# Patient Record
Sex: Female | Born: 1960 | Race: Black or African American | Hispanic: No | Marital: Married | Smoking: Never smoker
Health system: Southern US, Community
[De-identification: ages and names within clinical notes are randomized; demographics above are authoritative.]

## PROBLEM LIST (undated history)

## (undated) DIAGNOSIS — J302 Other seasonal allergic rhinitis: Secondary | ICD-10-CM

## (undated) DIAGNOSIS — D649 Anemia, unspecified: Secondary | ICD-10-CM

## (undated) DIAGNOSIS — E119 Type 2 diabetes mellitus without complications: Secondary | ICD-10-CM

## (undated) DIAGNOSIS — M79671 Pain in right foot: Secondary | ICD-10-CM

## (undated) DIAGNOSIS — M5432 Sciatica, left side: Secondary | ICD-10-CM

## (undated) DIAGNOSIS — I10 Essential (primary) hypertension: Secondary | ICD-10-CM

## (undated) DIAGNOSIS — K589 Irritable bowel syndrome without diarrhea: Secondary | ICD-10-CM

## (undated) HISTORY — DX: Irritable bowel syndrome, unspecified: K58.9

## (undated) HISTORY — PX: KNEE SURGERY: SHX244

## (undated) HISTORY — DX: Anemia, unspecified: D64.9

## (undated) HISTORY — PX: TONSILLECTOMY: SUR1361

## (undated) HISTORY — DX: Type 2 diabetes mellitus without complications: E11.9

## (undated) HISTORY — DX: Sciatica, left side: M54.32

## (undated) HISTORY — DX: Pain in right foot: M79.671

## (undated) HISTORY — PX: APPENDECTOMY: SHX54

## (undated) HISTORY — PX: WRIST SURGERY: SHX841

## (undated) HISTORY — PX: OTHER SURGICAL HISTORY: SHX169

## (undated) HISTORY — PX: ABDOMINAL HYSTERECTOMY: SHX81

## (undated) HISTORY — PX: BREAST CYST EXCISION: SHX579

## (undated) HISTORY — PX: CHOLECYSTECTOMY: SHX55

## (undated) HISTORY — PX: EXCISIONAL HEMORRHOIDECTOMY: SHX1541

---

## 1987-10-16 HISTORY — PX: TOTAL ABDOMINAL HYSTERECTOMY W/ BILATERAL SALPINGOOPHORECTOMY: SHX83

## 1999-10-16 HISTORY — PX: BREAST SURGERY: SHX581

## 1999-12-26 ENCOUNTER — Other Ambulatory Visit: Admission: RE | Admit: 1999-12-26 | Discharge: 1999-12-26 | Payer: Self-pay | Admitting: Obstetrics & Gynecology

## 2001-01-29 ENCOUNTER — Encounter: Payer: Self-pay | Admitting: Family Medicine

## 2001-01-29 ENCOUNTER — Encounter: Admission: RE | Admit: 2001-01-29 | Discharge: 2001-01-29 | Payer: Self-pay | Admitting: Family Medicine

## 2003-05-25 ENCOUNTER — Other Ambulatory Visit: Admission: RE | Admit: 2003-05-25 | Discharge: 2003-05-25 | Payer: Self-pay | Admitting: Obstetrics and Gynecology

## 2004-05-16 ENCOUNTER — Other Ambulatory Visit: Admission: RE | Admit: 2004-05-16 | Discharge: 2004-05-16 | Payer: Self-pay | Admitting: Obstetrics and Gynecology

## 2004-08-29 ENCOUNTER — Emergency Department (HOSPITAL_COMMUNITY): Admission: EM | Admit: 2004-08-29 | Discharge: 2004-08-29 | Payer: Self-pay | Admitting: Emergency Medicine

## 2004-09-03 ENCOUNTER — Emergency Department (HOSPITAL_COMMUNITY): Admission: EM | Admit: 2004-09-03 | Discharge: 2004-09-03 | Payer: Self-pay | Admitting: Emergency Medicine

## 2005-10-25 ENCOUNTER — Other Ambulatory Visit: Admission: RE | Admit: 2005-10-25 | Discharge: 2005-10-25 | Payer: Self-pay | Admitting: Obstetrics and Gynecology

## 2005-11-14 ENCOUNTER — Ambulatory Visit (HOSPITAL_COMMUNITY): Admission: RE | Admit: 2005-11-14 | Discharge: 2005-11-14 | Payer: Self-pay | Admitting: Obstetrics and Gynecology

## 2007-12-24 ENCOUNTER — Encounter: Admission: RE | Admit: 2007-12-24 | Discharge: 2007-12-24 | Payer: Self-pay | Admitting: Orthopedic Surgery

## 2007-12-28 ENCOUNTER — Encounter: Admission: RE | Admit: 2007-12-28 | Discharge: 2007-12-28 | Payer: Self-pay | Admitting: Orthopedic Surgery

## 2008-09-20 ENCOUNTER — Encounter: Admission: RE | Admit: 2008-09-20 | Discharge: 2008-12-19 | Payer: Self-pay | Admitting: Orthopedic Surgery

## 2008-11-24 ENCOUNTER — Encounter: Admission: RE | Admit: 2008-11-24 | Discharge: 2009-01-03 | Payer: Self-pay | Admitting: Orthopedic Surgery

## 2009-09-09 ENCOUNTER — Ambulatory Visit: Payer: Self-pay | Admitting: Diagnostic Radiology

## 2009-09-09 ENCOUNTER — Emergency Department (HOSPITAL_BASED_OUTPATIENT_CLINIC_OR_DEPARTMENT_OTHER): Admission: EM | Admit: 2009-09-09 | Discharge: 2009-09-09 | Payer: Self-pay | Admitting: Emergency Medicine

## 2010-03-29 ENCOUNTER — Encounter: Admission: RE | Admit: 2010-03-29 | Discharge: 2010-04-12 | Payer: Self-pay | Admitting: Orthopedic Surgery

## 2010-04-14 ENCOUNTER — Encounter: Admission: RE | Admit: 2010-04-14 | Discharge: 2010-05-16 | Payer: Self-pay | Admitting: Orthopedic Surgery

## 2010-10-11 ENCOUNTER — Ambulatory Visit (HOSPITAL_COMMUNITY)
Admission: RE | Admit: 2010-10-11 | Discharge: 2010-10-12 | Payer: Self-pay | Source: Home / Self Care | Attending: Obstetrics and Gynecology | Admitting: Obstetrics and Gynecology

## 2010-10-15 HISTORY — PX: BLADDER SUSPENSION: SHX72

## 2010-12-25 LAB — CBC
HCT: 33.5 % — ABNORMAL LOW (ref 36.0–46.0)
Hemoglobin: 10.5 g/dL — ABNORMAL LOW (ref 12.0–15.0)
Hemoglobin: 8.8 g/dL — ABNORMAL LOW (ref 12.0–15.0)
MCH: 20.9 pg — ABNORMAL LOW (ref 26.0–34.0)
MCHC: 31.3 g/dL (ref 30.0–36.0)
MCHC: 31.7 g/dL (ref 30.0–36.0)
MCV: 65.8 fL — ABNORMAL LOW (ref 78.0–100.0)
MCV: 66 fL — ABNORMAL LOW (ref 78.0–100.0)
Platelets: 303 10*3/uL (ref 150–400)
RBC: 4.21 MIL/uL (ref 3.87–5.11)
RDW: 15.9 % — ABNORMAL HIGH (ref 11.5–15.5)

## 2011-01-17 LAB — CBC
MCHC: 31.8 g/dL (ref 30.0–36.0)
MCV: 68.3 fL — ABNORMAL LOW (ref 78.0–100.0)
Platelets: 325 10*3/uL (ref 150–400)
RDW: 14.5 % (ref 11.5–15.5)

## 2011-01-17 LAB — DIFFERENTIAL
Basophils Absolute: 0.1 10*3/uL (ref 0.0–0.1)
Eosinophils Absolute: 0.1 10*3/uL (ref 0.0–0.7)
Lymphocytes Relative: 36 % (ref 12–46)
Monocytes Absolute: 0.5 10*3/uL (ref 0.1–1.0)
Neutrophils Relative %: 53 % (ref 43–77)

## 2011-01-17 LAB — BASIC METABOLIC PANEL
BUN: 20 mg/dL (ref 6–23)
CO2: 25 mEq/L (ref 19–32)
Calcium: 9.7 mg/dL (ref 8.4–10.5)
Chloride: 107 mEq/L (ref 96–112)
Creatinine, Ser: 1 mg/dL (ref 0.4–1.2)

## 2011-01-18 ENCOUNTER — Other Ambulatory Visit: Payer: Self-pay | Admitting: Family Medicine

## 2011-01-19 ENCOUNTER — Other Ambulatory Visit: Payer: Self-pay

## 2011-01-22 ENCOUNTER — Ambulatory Visit
Admission: RE | Admit: 2011-01-22 | Discharge: 2011-01-22 | Disposition: A | Discharge status: Home / Self Care | Payer: BC Managed Care – PPO | Source: Ambulatory Visit | Attending: Family Medicine | Admitting: Family Medicine

## 2011-01-22 MED ORDER — IOHEXOL 300 MG/ML  SOLN
125.0000 mL | Freq: Once | INTRAMUSCULAR | Status: AC | PRN
  Administered 2011-01-22: 125 mL via INTRAVENOUS

## 2011-01-31 ENCOUNTER — Other Ambulatory Visit: Payer: Self-pay | Admitting: Family Medicine

## 2011-02-01 ENCOUNTER — Other Ambulatory Visit: Payer: BC Managed Care – PPO

## 2011-02-06 ENCOUNTER — Ambulatory Visit
Admission: RE | Admit: 2011-02-06 | Discharge: 2011-02-06 | Disposition: A | Discharge status: Home / Self Care | Payer: BC Managed Care – PPO | Source: Ambulatory Visit | Attending: Family Medicine | Admitting: Family Medicine

## 2011-02-06 ENCOUNTER — Other Ambulatory Visit: Payer: Self-pay | Admitting: Family Medicine

## 2011-03-31 ENCOUNTER — Emergency Department (INDEPENDENT_AMBULATORY_CARE_PROVIDER_SITE_OTHER): Payer: BC Managed Care – PPO

## 2011-03-31 ENCOUNTER — Emergency Department (HOSPITAL_BASED_OUTPATIENT_CLINIC_OR_DEPARTMENT_OTHER)
Admission: EM | Admit: 2011-03-31 | Discharge: 2011-04-01 | Disposition: A | Discharge status: Home / Self Care | Payer: BC Managed Care – PPO | Attending: Emergency Medicine | Admitting: Emergency Medicine

## 2011-03-31 DIAGNOSIS — J45909 Unspecified asthma, uncomplicated: Secondary | ICD-10-CM | POA: Insufficient documentation

## 2011-03-31 DIAGNOSIS — R079 Chest pain, unspecified: Secondary | ICD-10-CM | POA: Insufficient documentation

## 2011-03-31 DIAGNOSIS — I10 Essential (primary) hypertension: Secondary | ICD-10-CM | POA: Insufficient documentation

## 2011-03-31 DIAGNOSIS — R0602 Shortness of breath: Secondary | ICD-10-CM | POA: Insufficient documentation

## 2011-03-31 HISTORY — DX: Essential (primary) hypertension: I10

## 2011-03-31 LAB — CBC
MCH: 20.2 pg — ABNORMAL LOW (ref 26.0–34.0)
MCHC: 31.9 g/dL (ref 30.0–36.0)
MCV: 63.2 fL — ABNORMAL LOW (ref 78.0–100.0)
Platelets: 332 10*3/uL (ref 150–400)
RBC: 4.95 MIL/uL (ref 3.87–5.11)

## 2011-03-31 LAB — BASIC METABOLIC PANEL
CO2: 26 mEq/L (ref 19–32)
Chloride: 103 mEq/L (ref 96–112)
Creatinine, Ser: 0.8 mg/dL (ref 0.50–1.10)

## 2011-03-31 LAB — DIFFERENTIAL
Basophils Absolute: 0 10*3/uL (ref 0.0–0.1)
Eosinophils Absolute: 0.1 10*3/uL (ref 0.0–0.7)
Lymphs Abs: 3 10*3/uL (ref 0.7–4.0)
Monocytes Absolute: 0.6 10*3/uL (ref 0.1–1.0)
Monocytes Relative: 9 % (ref 3–12)
Neutro Abs: 3.4 10*3/uL (ref 1.7–7.7)

## 2011-03-31 LAB — TROPONIN I: Troponin I: <0.3 ng/mL (ref <0.30)

## 2011-04-01 ENCOUNTER — Encounter (HOSPITAL_BASED_OUTPATIENT_CLINIC_OR_DEPARTMENT_OTHER): Payer: Self-pay | Admitting: Radiology

## 2011-04-01 DIAGNOSIS — R0602 Shortness of breath: Secondary | ICD-10-CM

## 2011-04-01 DIAGNOSIS — R079 Chest pain, unspecified: Secondary | ICD-10-CM

## 2011-04-01 MED ORDER — IOHEXOL 350 MG/ML SOLN
80.0000 mL | Freq: Once | INTRAVENOUS | Status: AC | PRN
  Administered 2011-04-01: 80 mL via INTRAVENOUS

## 2012-01-17 ENCOUNTER — Emergency Department (INDEPENDENT_AMBULATORY_CARE_PROVIDER_SITE_OTHER): Payer: BC Managed Care – PPO

## 2012-01-17 ENCOUNTER — Emergency Department (HOSPITAL_BASED_OUTPATIENT_CLINIC_OR_DEPARTMENT_OTHER)
Admission: EM | Admit: 2012-01-17 | Discharge: 2012-01-17 | Disposition: A | Discharge status: Home / Self Care | Payer: BC Managed Care – PPO | Attending: Emergency Medicine | Admitting: Emergency Medicine

## 2012-01-17 ENCOUNTER — Encounter (HOSPITAL_BASED_OUTPATIENT_CLINIC_OR_DEPARTMENT_OTHER): Payer: Self-pay | Admitting: *Deleted

## 2012-01-17 DIAGNOSIS — R05 Cough: Secondary | ICD-10-CM | POA: Insufficient documentation

## 2012-01-17 DIAGNOSIS — R059 Cough, unspecified: Secondary | ICD-10-CM | POA: Insufficient documentation

## 2012-01-17 DIAGNOSIS — J45909 Unspecified asthma, uncomplicated: Secondary | ICD-10-CM | POA: Insufficient documentation

## 2012-01-17 DIAGNOSIS — J3489 Other specified disorders of nose and nasal sinuses: Secondary | ICD-10-CM | POA: Insufficient documentation

## 2012-01-17 DIAGNOSIS — I10 Essential (primary) hypertension: Secondary | ICD-10-CM | POA: Insufficient documentation

## 2012-01-17 HISTORY — DX: Other seasonal allergic rhinitis: J30.2

## 2012-01-17 MED ORDER — PREDNISONE 20 MG PO TABS: 40.0000 mg | ORAL_TABLET | Freq: Every day | ORAL | Status: DC

## 2012-01-17 MED ORDER — IPRATROPIUM BROMIDE 0.02 % IN SOLN
0.5000 mg | Freq: Once | RESPIRATORY_TRACT | Status: AC
  Administered 2012-01-17: 0.5 mg via RESPIRATORY_TRACT
  Filled 2012-01-17: qty 2.5

## 2012-01-17 MED ORDER — ALBUTEROL SULFATE (5 MG/ML) 0.5% IN NEBU
5.0000 mg | INHALATION_SOLUTION | Freq: Once | RESPIRATORY_TRACT | Status: AC
  Administered 2012-01-17: 5 mg via RESPIRATORY_TRACT
  Filled 2012-01-17: qty 1

## 2012-01-17 NOTE — Discharge Instructions (Signed)

## 2012-01-17 NOTE — ED Provider Notes (Signed)
Medical screening examination/treatment/procedure(s) were performed by non-physician practitioner and as supervising physician I was immediately available for consultation/collaboration.   Nat Christen, MD 01/17/12 [phone]

## 2012-01-17 NOTE — ED Provider Notes (Signed)
History     CSN: [id number]  Arrival date & time 01/17/12  2026   First MD Initiated Contact with Patient 01/17/12 2037      Chief Complaint  Patient presents with  . URI    (Consider location/radiation/quality/duration/timing/severity/associated sxs/prior treatment) Patient is a 51 y.o. female presenting with URI. The history is provided by the patient. No language interpreter was used.  URI The primary symptoms include cough and wheezing. Primary symptoms do not include fever. The current episode started 3 to 5 days ago. This is a new problem.  Symptoms associated with the illness include rhinorrhea.    Past Medical History  Diagnosis Date  . Hypertension   . Asthma   . Seasonal allergies     Past Surgical History  Procedure Date  . Abdominal hysterectomy   . Knee surgery   . Appendectomy   . Cholecystectomy   . Tonsillectomy   . Wrist surgery   . Breast surgery     No family history on file.  History  Substance Use Topics  . Smoking status: Never Smoker   . Smokeless tobacco: Not on file  . Alcohol Use: No    OB History    Grav Para Term Preterm Abortions TAB SAB Ect Mult Living                  Review of Systems  Constitutional: Negative for fever.  HENT: Positive for rhinorrhea.   Respiratory: Positive for cough and wheezing.   Cardiovascular: Negative.   Gastrointestinal: Negative.     Allergies  Aspirin  Home Medications   Current Outpatient Rx  Name Route Sig Dispense Refill  . BENZONATATE 100 MG PO CAPS Oral Take 100 mg by mouth 3 (three) times daily as needed.    Marland Kitchen ZYRTEC PO Oral Take 1 tablet by mouth daily as needed. Patient uses this medication for her allergies.    Marland Kitchen VITAMIN D 1000 UNITS PO TABS Oral Take 1,000 Units by mouth daily.    Marland Kitchen FERROUS SULFATE 325 (65 FE) MG PO TABS Oral Take 325 mg by mouth daily with breakfast.    . IBUPROFEN 200 MG PO TABS Oral Take 400 mg by mouth every 6 (six) hours as needed. Patient used this  medication for her knee pain.    Marland Kitchen BENICAR PO Oral Take 1 tablet by mouth daily.    . TRIAMTERENE-HCTZ 75-50 MG PO TABS Oral Take 1 tablet by mouth daily.      BP 138/72  Pulse 82  Temp(Src) 98.6 F (37 C) (Oral)  Resp 18  Ht 5' (1.524 m)  Wt 219 lb (99.338 kg)  BMI 42.77 kg/m2  SpO2 99%  Physical Exam  Nursing note and vitals reviewed. Constitutional: She is oriented to person, place, and time. She appears well-developed and well-nourished.  HENT:  Head: Normocephalic and atraumatic.  Right Ear: External ear normal.  Left Ear: External ear normal.  Nose: Rhinorrhea present.  Cardiovascular: Normal rate and regular rhythm.   Pulmonary/Chest: Effort normal. She has wheezes.  Musculoskeletal: Normal range of motion.  Neurological: She is alert and oriented to person, place, and time.  Skin: Skin is warm and dry.    ED Course  Procedures (including critical care time)  Labs Reviewed - No data to display Dg Chest 2 View  01/17/2012  *RADIOLOGY REPORT*  Clinical Data: Cough.  CHEST - 2 VIEW  Comparison: Chest CT 04/01/2011.  Findings: The cardiac silhouette, mediastinal and hilar contours are within  normal limits and stable.  The lungs are clear.  No pleural effusion.  The bony thorax is intact.  IMPRESSION: No acute cardiopulmonary findings.  Original Report Authenticated By: P. Loralie Champagne, M.D.     1. Asthmatic bronchitis       MDM  Pt felling better after treatment:will send home on steroids for a couple of days        Teressa Lower, NP 01/17/12 2147

## 2012-01-17 NOTE — ED Notes (Signed)
Pt reports cough and chest congestion since Sunday. [facility] and was [facility] cough/cold meds. Then saw PCP Tuesday and was given a "breathing tx". Pt reports continued chest tightness, congestion and SOB.

## 2012-02-21 ENCOUNTER — Emergency Department (HOSPITAL_BASED_OUTPATIENT_CLINIC_OR_DEPARTMENT_OTHER)
Admission: EM | Admit: 2012-02-21 | Discharge: 2012-02-21 | Disposition: A | Discharge status: Home / Self Care | Payer: BC Managed Care – PPO | Attending: Emergency Medicine | Admitting: Emergency Medicine

## 2012-02-21 ENCOUNTER — Encounter (HOSPITAL_BASED_OUTPATIENT_CLINIC_OR_DEPARTMENT_OTHER): Payer: Self-pay | Admitting: *Deleted

## 2012-02-21 DIAGNOSIS — I10 Essential (primary) hypertension: Secondary | ICD-10-CM | POA: Insufficient documentation

## 2012-02-21 DIAGNOSIS — R42 Dizziness and giddiness: Secondary | ICD-10-CM

## 2012-02-21 DIAGNOSIS — R5381 Other malaise: Secondary | ICD-10-CM | POA: Insufficient documentation

## 2012-02-21 LAB — CBC
HCT: 33.1 % — ABNORMAL LOW (ref 36.0–46.0)
Hemoglobin: 10.7 g/dL — ABNORMAL LOW (ref 12.0–15.0)
MCV: 66.2 fL — ABNORMAL LOW (ref 78.0–100.0)
RBC: 5 MIL/uL (ref 3.87–5.11)
WBC: 6.9 10*3/uL (ref 4.0–10.5)

## 2012-02-21 LAB — COMPREHENSIVE METABOLIC PANEL
AST: 14 U/L (ref 0–37)
BUN: 16 mg/dL (ref 6–23)
CO2: 27 mEq/L (ref 19–32)
Chloride: 104 mEq/L (ref 96–112)
Creatinine, Ser: 0.9 mg/dL (ref 0.50–1.10)
GFR calc non Af Amer: 73 mL/min — ABNORMAL LOW (ref >90)
Total Bilirubin: 0.1 mg/dL — ABNORMAL LOW (ref 0.3–1.2)

## 2012-02-21 LAB — OCCULT BLOOD X 1 CARD TO LAB, STOOL: Fecal Occult Bld: NEGATIVE

## 2012-02-21 LAB — DIFFERENTIAL
Basophils Relative: 0 % (ref 0–1)
Eosinophils Relative: 2 % (ref 0–5)
Lymphs Abs: 2.1 10*3/uL (ref 0.7–4.0)
Monocytes Relative: 8 % (ref 3–12)
Neutro Abs: 4.1 10*3/uL (ref 1.7–7.7)

## 2012-02-21 MED ORDER — SODIUM CHLORIDE 0.9 % IV SOLN: Freq: Once | INTRAVENOUS | Status: DC

## 2012-02-21 MED ORDER — MECLIZINE HCL 12.5 MG PO TABS: 25.0000 mg | ORAL_TABLET | Freq: Three times a day (TID) | ORAL | Status: AC | PRN

## 2012-02-21 MED ORDER — SODIUM CHLORIDE 0.9 % IV BOLUS (SEPSIS)
1000.0000 mL | Freq: Once | INTRAVENOUS | Status: AC
  Administered 2012-02-21: 1000 mL via INTRAVENOUS

## 2012-02-21 MED ORDER — MECLIZINE HCL 25 MG PO TABS
25.0000 mg | ORAL_TABLET | Freq: Once | ORAL | Status: AC
  Administered 2012-02-21: 25 mg via ORAL
  Filled 2012-02-21: qty 1

## 2012-02-21 NOTE — Discharge Instructions (Signed)

## 2012-02-21 NOTE — ED Provider Notes (Signed)
Medical screening examination/treatment/procedure(s) were conducted as a shared visit with non-physician practitioner(s) and myself.  I personally evaluated the patient during the encounter  Toy Baker, MD 02/21/12 2210

## 2012-02-21 NOTE — ED Notes (Signed)
States she has been having issues with rectal bleeding for the past month. Her MD is aware and she is suppose to have a colonoscopy next week. Last HGB was 10 something. She has been weak and dizzy today.

## 2012-02-21 NOTE — ED Provider Notes (Signed)
History     CSN: [id number]  Arrival date & time 02/21/12  2024   First MD Initiated Contact with Patient 02/21/12 2036      Chief Complaint  Patient presents with  . Weakness    (Consider location/radiation/quality/duration/timing/severity/associated sxs/prior treatment) HPI Comments: Pt states that she started having dizziness and weakness today:pt states that it is worse with change of position:pt states that she has had rectal bleeding over the last month and her hgb dropped one point in the last month:pt is scheduled for a colonoscopy in the next month  Patient is a 51 y.o. female presenting with weakness. The history is provided by the patient. No language interpreter was used.  Weakness The primary symptoms include dizziness. Primary symptoms do not include nausea or vomiting.  Dizziness also occurs with weakness. Dizziness does not occur with nausea or vomiting.  Additional symptoms include weakness.    Past Medical History  Diagnosis Date  . Hypertension   . Asthma   . Seasonal allergies     Past Surgical History  Procedure Date  . Abdominal hysterectomy   . Knee surgery   . Appendectomy   . Cholecystectomy   . Tonsillectomy   . Wrist surgery   . Breast surgery     No family history on file.  History  Substance Use Topics  . Smoking status: Never Smoker   . Smokeless tobacco: Not on file  . Alcohol Use: No    OB History    Grav Para Term Preterm Abortions TAB SAB Ect Mult Living                  Review of Systems  HENT: Negative.   Respiratory: Negative.  Negative for chest tightness and shortness of breath.   Cardiovascular: Negative.   Gastrointestinal: Negative for nausea and vomiting.  Neurological: Positive for dizziness and weakness.    Allergies  Aspirin  Home Medications   Current Outpatient Rx  Name Route Sig Dispense Refill  . ALBUTEROL SULFATE HFA 108 (90 BASE) MCG/ACT IN AERS Inhalation Inhale 2 puffs into the lungs every 6  (six) hours as needed.    Maximino Greenland 18-103 MCG/ACT IN AERO Inhalation Inhale 2 puffs into the lungs every 6 (six) hours as needed.    Marland Kitchen BENZONATATE 100 MG PO CAPS Oral Take 100 mg by mouth 3 (three) times daily as needed.    Marland Kitchen ZYRTEC PO Oral Take 1 tablet by mouth daily as needed. Patient uses this medication for her allergies.    Marland Kitchen VITAMIN D 1000 UNITS PO TABS Oral Take 1,000 Units by mouth daily.    Marland Kitchen FERROUS SULFATE 325 (65 FE) MG PO TABS Oral Take 325 mg by mouth daily with breakfast.    . IBUPROFEN 200 MG PO TABS Oral Take 400 mg by mouth every 6 (six) hours as needed. Patient used this medication for her knee pain.    Marland Kitchen BENICAR PO Oral Take 1 tablet by mouth daily.    Marland Kitchen PREDNISONE 20 MG PO TABS Oral Take 2 tablets (40 mg total) by mouth daily. 10 tablet 0  . TRIAMTERENE-HCTZ 75-50 MG PO TABS Oral Take 1 tablet by mouth daily.      BP 140/88  Pulse 84  Temp(Src) 98.4 F (36.9 C) (Oral)  Resp 20  Wt 218 lb (98.884 kg)  SpO2 99%  Physical Exam  Nursing note and vitals reviewed. Constitutional: She is oriented to person, place, and time. She appears well-developed and well-nourished.  HENT:  Head: Atraumatic.  Eyes: Conjunctivae and EOM are normal.  Cardiovascular: Normal rate and regular rhythm.   Pulmonary/Chest: Effort normal and breath sounds normal.  Abdominal: Soft. Bowel sounds are normal. There is no tenderness.  Genitourinary: Rectal exam shows no tenderness. Guaiac negative stool.  Musculoskeletal: Normal range of motion.  Neurological: She is alert and oriented to person, place, and time.  Skin: Skin is warm and dry.    ED Course  Procedures (including critical care time)  Labs Reviewed  CBC - Abnormal; Notable for the following:    Hemoglobin 10.7 (*)    HCT 33.1 (*)    MCV 66.2 (*)    MCH 21.4 (*)    RDW 15.6 (*)    All other components within normal limits  COMPREHENSIVE METABOLIC PANEL - Abnormal; Notable for the following:    Glucose, Bld  146 (*)    Total Bilirubin 0.1 (*)    GFR calc non Af Amer 73 (*)    GFR calc Af Amer 85 (*)    All other components within normal limits  DIFFERENTIAL  OCCULT BLOOD X 1 CARD TO LAB, STOOL   No results found.   1. Vertigo       MDM  Pt is feeling better at this time:pt is able to ambulate without any problem:will send home on meclizine:pt to follow up as needed        Teressa Lower, NP 02/21/12 2154

## 2012-02-21 NOTE — ED Notes (Signed)
Pt. Is reporting she feels better

## 2012-02-21 NOTE — ED Notes (Signed)
A [place] RN is with the Pt. At present time.

## 2012-06-09 ENCOUNTER — Emergency Department (HOSPITAL_BASED_OUTPATIENT_CLINIC_OR_DEPARTMENT_OTHER)
Admission: EM | Admit: 2012-06-09 | Discharge: 2012-06-09 | Disposition: A | Discharge status: Home / Self Care | Payer: BC Managed Care – PPO | Attending: Emergency Medicine | Admitting: Emergency Medicine

## 2012-06-09 ENCOUNTER — Encounter (HOSPITAL_BASED_OUTPATIENT_CLINIC_OR_DEPARTMENT_OTHER): Payer: Self-pay | Admitting: Family Medicine

## 2012-06-09 DIAGNOSIS — I10 Essential (primary) hypertension: Secondary | ICD-10-CM | POA: Insufficient documentation

## 2012-06-09 DIAGNOSIS — J45909 Unspecified asthma, uncomplicated: Secondary | ICD-10-CM | POA: Insufficient documentation

## 2012-06-09 DIAGNOSIS — M5430 Sciatica, unspecified side: Secondary | ICD-10-CM | POA: Insufficient documentation

## 2012-06-09 DIAGNOSIS — M5432 Sciatica, left side: Secondary | ICD-10-CM

## 2012-06-09 MED ORDER — IBUPROFEN 800 MG PO TABS: 800.0000 mg | ORAL_TABLET | Freq: Three times a day (TID) | ORAL | Status: AC

## 2012-06-09 MED ORDER — KETOROLAC TROMETHAMINE 60 MG/2ML IM SOLN
60.0000 mg | Freq: Once | INTRAMUSCULAR | Status: AC
  Administered 2012-06-09: 60 mg via INTRAMUSCULAR
  Filled 2012-06-09: qty 2

## 2012-06-09 MED ORDER — CYCLOBENZAPRINE HCL 10 MG PO TABS: 10.0000 mg | ORAL_TABLET | Freq: Two times a day (BID) | ORAL | Status: AC | PRN

## 2012-06-09 MED ORDER — HYDROCODONE-ACETAMINOPHEN 5-325 MG PO TABS: 1.0000 | ORAL_TABLET | ORAL | Status: AC | PRN

## 2012-06-09 NOTE — ED Provider Notes (Signed)
History     CSN: [id number]  Arrival date & time 06/09/12  1127   First MD Initiated Contact with Patient 06/09/12 1240      Chief Complaint  Patient presents with  . Back Pain    (Consider location/radiation/quality/duration/timing/severity/associated sxs/prior treatment) Patient is a 51 y.o. female presenting with back pain. The history is provided by the patient.  Back Pain  This is a new problem. The current episode started yesterday. The problem occurs constantly. Pertinent negatives include no fever, no numbness, no dysuria and no weakness. Associated symptoms comments: Sharp pain in left buttock with movement, better with rest. This is the second time she has had this episode. She had a recent foot injury where she had to wear a boot on the left and subsequently developed pain in left buttock as today. It improved with rest, but now she is again active, the pain returned. Foot injury improving..    Past Medical History  Diagnosis Date  . Hypertension   . Asthma   . Seasonal allergies     Past Surgical History  Procedure Date  . Abdominal hysterectomy   . Knee surgery   . Appendectomy   . Cholecystectomy   . Tonsillectomy   . Wrist surgery   . Breast surgery     No family history on file.  History  Substance Use Topics  . Smoking status: Never Smoker   . Smokeless tobacco: Not on file  . Alcohol Use: No    OB History    Grav Para Term Preterm Abortions TAB SAB Ect Mult Living                  Review of Systems  Constitutional: Negative for fever and chills.  Respiratory: Negative.   Cardiovascular: Negative.   Gastrointestinal: Negative.   Genitourinary: Negative for dysuria.  Musculoskeletal: Positive for back pain.       See HPI.  Skin: Negative.   Neurological: Negative.  Negative for weakness and numbness.    Allergies  Aspirin  Home Medications   Current Outpatient Rx  Name Route Sig Dispense Refill  . ALBUTEROL SULFATE HFA 108 (90  BASE) MCG/ACT IN AERS Inhalation Inhale 2 puffs into the lungs every 6 (six) hours as needed.    Maximino Greenland 18-103 MCG/ACT IN AERO Inhalation Inhale 2 puffs into the lungs daily.     Marland Kitchen ZYRTEC PO Oral Take 1 tablet by mouth daily as needed. Patient uses this medication for her allergies.    Marland Kitchen VITAMIN D 1000 UNITS PO TABS Oral Take 1,000 Units by mouth daily.    Marland Kitchen FERROUS SULFATE 325 (65 FE) MG PO TABS Oral Take 325 mg by mouth daily with breakfast.    . IBUPROFEN 200 MG PO TABS Oral Take 400 mg by mouth every 6 (six) hours as needed. Patient used this medication for her knee pain.    Marland Kitchen BENICAR PO Oral Take 1 tablet by mouth daily. Unknown dose    . TRIAMTERENE-HCTZ 75-50 MG PO TABS Oral Take 0.5 tablets by mouth daily.       BP 104/61  Pulse 71  Temp 97.7 F (36.5 C) (Oral)  Resp 16  Ht 5' (1.524 m)  Wt 225 lb (102.059 kg)  BMI 43.94 kg/m2  SpO2 98%  Physical Exam  Constitutional: She appears well-developed and well-nourished.  HENT:  Head: Normocephalic.  Neck: Normal range of motion. Neck supple.  Cardiovascular: Normal rate and regular rhythm.   Pulmonary/Chest: Effort normal  and breath sounds normal.  Abdominal: Soft. Bowel sounds are normal. There is no tenderness. There is no rebound and no guarding.  Musculoskeletal: Normal range of motion.       Lumbar and paralumbar non-tender and is without swelling or discoloration. Left sciatic nerve tender to palpation which reproduces the pain of complaint.  Neurological: She is alert. She has normal reflexes. No cranial nerve deficit. Coordination normal.  Skin: Skin is warm and dry. No rash noted.  Psychiatric: She has a normal mood and affect.    ED Course  Procedures (including critical care time)  Labs Reviewed - No data to display No results found.   No diagnosis found.   1. Left sciatica  MDM  Uncomplicated sciatica pain on left.        Rodena Medin, PA-C 06/09/12 1320

## 2012-06-09 NOTE — ED Provider Notes (Signed)
Medical screening examination/treatment/procedure(s) were performed by non-physician practitioner and as supervising physician I was immediately available for consultation/collaboration.   Rolan Bucco, MD 06/09/12 [phone]

## 2012-06-09 NOTE — ED Notes (Signed)
Pt c/o left low back "spasm" radiating into left buttock. Pt reports h/o same. Pt ambulatory.

## 2012-10-15 LAB — HM COLONOSCOPY

## 2012-11-03 ENCOUNTER — Ambulatory Visit: Payer: BC Managed Care – PPO | Admitting: Obstetrics and Gynecology

## 2012-11-03 ENCOUNTER — Encounter: Payer: Self-pay | Admitting: Obstetrics and Gynecology

## 2012-11-03 VITALS — BP 100/60 | Resp 16 | Ht 60.0 in | Wt 221.0 lb

## 2012-11-03 DIAGNOSIS — M5432 Sciatica, left side: Secondary | ICD-10-CM | POA: Insufficient documentation

## 2012-11-03 DIAGNOSIS — Z01419 Encounter for gynecological examination (general) (routine) without abnormal findings: Secondary | ICD-10-CM

## 2012-11-03 MED ORDER — ESTRADIOL 0.1 MG/GM VA CREA: 2.0000 g | TOPICAL_CREAM | Freq: Every day | VAGINAL | Status: DC

## 2012-11-03 NOTE — Progress Notes (Signed)
Subjective:    Mackenzie Weber is a 52 y.o. female G2P2 who presents for annual exam. The patient has no complaints today. Is being seen by a specialist for sciatica.   Review of Systems Gastrointestinal:No change in bowel habits, no abdominal pain, no rectal bleeding Genitourinary:negative for abnormal vaginal bleeding,  dysuria, frequency, hematuria, nocturia and urinary incontinence    Objective:     BP 100/60  Resp 16  Ht 5' (1.524 m)  Wt 221 lb (100.245 kg)  BMI 43.16 kg/m2 Weight:  Wt Readings from Last 1 Encounters:  11/03/12 221 lb (100.245 kg)   Body mass index is 43.16 kg/(m^2). General Appearance:  Well nourished in no acute distress HEENT: Grossly normal Neck / Thyroid: Supple, no masses, nodes or enlargement Lungs: Clear to auscultation bilaterally Back: No CVA tenderness Breast Exam: No masses or nodes.No dimpling, nipple retraction or discharge. Cardiovascular: Regular rate and rhythm.  Gastrointestinal: Soft, non-tender, no masses or organomegaly Pelvic Exam: EGBUS-normal, vagina-pink mucosa without lesions, cervix-no tenderness or lesions, uterus-appears normal size, shape and consistency, adnexae-no masses or tenderness Rectovaginal: Normal sphincter tone and  no masses  Lymphatic Exam: Non-palpable nodes in neck, clavicular, axillary, or inguinal regions Skin: No rash or abnormalities Extremities: no clubbing cyanosis or edema Neurologic: Grossly normal Psychiatric: Alert and oriented x 3     Assessment:   Routine GYN Exam Left Sciatica   Plan:   Continue Estrace Vaginal Cream as directed  RTO 1 year or prn  Reviewed revised guidelines for PAP smear maintenance schedule-hysterectomy   Cristan Scherzer,ELMIRAPA-C

## 2012-11-03 NOTE — Progress Notes (Signed)
Regular Periods: no Mammogram: yes  Monthly Breast Ex.: yes Exercise: yes  Tetanus < 10 years: yes Seatbelts: yes  NI. Bladder Functn.: yes Abuse at home: no  Daily BM's: yes Stressful Work: yes  Healthy Diet: no Sigmoid-Colonoscopy: 2013 "Polps removed" Rectal Bleeding.   Calcium: yes Medical problems this year: sciatic nerve issuses. Pt is seeing a specialist.    LAST PAP:09/27/2011  Contraception: HYST   Mammogram:  10/01/12 "WNL"  PCP: Dr.Monica Montez Morita  PMH: No Chnages  FMH: No changes  Last Bone Scan: N/A

## 2012-12-06 ENCOUNTER — Emergency Department (HOSPITAL_BASED_OUTPATIENT_CLINIC_OR_DEPARTMENT_OTHER)
Admission: EM | Admit: 2012-12-06 | Discharge: 2012-12-06 | Disposition: A | Discharge status: Home / Self Care | Payer: BC Managed Care – PPO | Attending: Emergency Medicine | Admitting: Emergency Medicine

## 2012-12-06 ENCOUNTER — Encounter (HOSPITAL_BASED_OUTPATIENT_CLINIC_OR_DEPARTMENT_OTHER): Payer: Self-pay | Admitting: *Deleted

## 2012-12-06 DIAGNOSIS — Z8719 Personal history of other diseases of the digestive system: Secondary | ICD-10-CM | POA: Insufficient documentation

## 2012-12-06 DIAGNOSIS — I10 Essential (primary) hypertension: Secondary | ICD-10-CM | POA: Insufficient documentation

## 2012-12-06 DIAGNOSIS — Z8739 Personal history of other diseases of the musculoskeletal system and connective tissue: Secondary | ICD-10-CM | POA: Insufficient documentation

## 2012-12-06 DIAGNOSIS — J45909 Unspecified asthma, uncomplicated: Secondary | ICD-10-CM | POA: Insufficient documentation

## 2012-12-06 DIAGNOSIS — Z79899 Other long term (current) drug therapy: Secondary | ICD-10-CM | POA: Insufficient documentation

## 2012-12-06 DIAGNOSIS — M5412 Radiculopathy, cervical region: Secondary | ICD-10-CM

## 2012-12-06 MED ORDER — NAPROXEN 250 MG PO TABS
500.0000 mg | ORAL_TABLET | Freq: Once | ORAL | Status: AC
  Administered 2012-12-06: 500 mg via ORAL
  Filled 2012-12-06: qty 2

## 2012-12-06 MED ORDER — HYDROCODONE-ACETAMINOPHEN 5-325 MG PO TABS
2.0000 | ORAL_TABLET | Freq: Once | ORAL | Status: AC
  Administered 2012-12-06: 2 via ORAL
  Filled 2012-12-06: qty 2

## 2012-12-06 MED ORDER — HYDROCODONE-ACETAMINOPHEN 5-325 MG PO TABS: 1.0000 | ORAL_TABLET | Freq: Four times a day (QID) | ORAL | Status: DC | PRN

## 2012-12-06 NOTE — ED Notes (Signed)
Pt c/o pain in her left neck that radiates down into her left shoulder and arm x2.5 weeks. Pt reports no relief from OTC meds or flexeril.

## 2012-12-06 NOTE — ED Provider Notes (Signed)
History     CSN: [id number]  Arrival date & time 12/06/12  0301   First MD Initiated Contact with Patient 12/06/12 0407      Chief Complaint  Patient presents with  . Shoulder Pain    (Consider location/radiation/quality/duration/timing/severity/associated sxs/prior treatment) HPI Is a 52 year old female with history of sciatica. She is here with 2 half week history of pain in her left neck radiating to her left shoulder and left upper extremity at about the C7 dermatome. The pain acutely worsened yesterday evening. She is having difficulty sleeping this morning due to the pain. The pain is worse with movement of the neck. There is no weakness or numbness associated with it. The pain is moderate to severe. She has taken ibuprofen and Flexeril without relief.  Past Medical History  Diagnosis Date  . Hypertension   . Asthma   . Seasonal allergies   . IBS (irritable bowel syndrome)   . Sciatica of left side   . Foot pain, right     Past Surgical History  Procedure Laterality Date  . Total abdominal hysterectomy w/ bilateral salpingoophorectomy  1989    LSO-1987; RsO-1998  . Knee surgery      right  . Appendectomy    . Cholecystectomy    . Tonsillectomy    . Wrist surgery      carpal tunnel repair  . Breast surgery  2001    /biopsy-benign  . Small bowel surgery    . Excisional hemorrhoidectomy    . Bladder suspension  2012    TVT    Family History  Problem Relation Age of Onset  . Diabetes Father   . Hypertension Father   . Diabetes Mother   . Hypertension Mother   . Diabetes Brother   . Hypertension Sister     History  Substance Use Topics  . Smoking status: Never Smoker   . Smokeless tobacco: Never Used  . Alcohol Use: No    OB History   Grav Para Term Preterm Abortions TAB SAB Ect Mult Living   2 2              Review of Systems  All other systems reviewed and are negative.    Allergies  Aspirin  Home Medications   Current Outpatient Rx   Name  Route  Sig  Dispense  Refill  . amoxicillin-clavulanate (AUGMENTIN) 500-125 MG per tablet   Oral   Take 1 tablet by mouth 2 (two) times daily.         Marland Kitchen albuterol (PROVENTIL HFA;VENTOLIN HFA) 108 (90 BASE) MCG/ACT inhaler   Inhalation   Inhale 2 puffs into the lungs every 6 (six) hours as needed.         Marland Kitchen albuterol-ipratropium (COMBIVENT) 18-103 MCG/ACT inhaler   Inhalation   Inhale 2 puffs into the lungs daily.          . Cetirizine HCl (ZYRTEC PO)   Oral   Take 1 tablet by mouth daily as needed. Patient uses this medication for her allergies.         . cholecalciferol (VITAMIN D) 1000 UNITS tablet   Oral   Take 1,000 Units by mouth daily.         Marland Kitchen EPINEPHrine (EPIPEN JR) 0.15 MG/0.3ML injection   Intramuscular   Inject 0.15 mg into the muscle as needed.         Marland Kitchen estradiol (ESTRACE) 0.1 MG/GM vaginal cream   Vaginal   Place 0.25 Applicatorfuls vaginally  daily.   42.5 g   11   . ferrous sulfate 325 (65 FE) MG tablet   Oral   Take 325 mg by mouth daily with breakfast.         . ibuprofen (ADVIL,MOTRIN) 200 MG tablet   Oral   Take 400 mg by mouth every 6 (six) hours as needed. Patient used this medication for her knee pain.         . Olmesartan Medoxomil (BENICAR PO)   Oral   Take 1 tablet by mouth daily. Unknown dose         . triamterene-hydrochlorothiazide (MAXZIDE) 75-50 MG per tablet   Oral   Take 0.5 tablets by mouth daily.            BP 111/66  Pulse 68  Temp(Src) 98.1 F (36.7 C) (Oral)  Ht 5' (1.524 m)  Wt 222 lb (100.699 kg)  BMI 43.36 kg/m2  SpO2 100%  Physical Exam General: Well-developed, well-nourished female in no acute distress; appearance consistent with age of record HENT: normocephalic, atraumatic Eyes: Normal appearance Neck: supple but range of motion limited due to pain on rotation Heart: regular rate and rhythm Lungs: Normal respiratory effort and excursion Abdomen: soft; nondistended Extremities: No  deformity; full range of motion Neurologic: Awake, alert and oriented; motor function intact in all extremities and symmetric; no facial droop; sensation intact and symmetric in upper extremities Skin: Warm and dry Psychiatric: Normal mood and affect    ED Course  Procedures (including critical care time)     MDM  Exam history consistent with cervical radiculopathy.        Hanley Seamen, MD 12/06/12 [phone]

## 2013-02-03 ENCOUNTER — Encounter: Payer: Self-pay | Admitting: *Deleted

## 2013-02-03 ENCOUNTER — Emergency Department (INDEPENDENT_AMBULATORY_CARE_PROVIDER_SITE_OTHER)
Admission: EM | Admit: 2013-02-03 | Discharge: 2013-02-03 | Disposition: A | Discharge status: Home / Self Care | Payer: BC Managed Care – PPO | Source: Home / Self Care | Attending: Family Medicine | Admitting: Family Medicine

## 2013-02-03 DIAGNOSIS — S63501A Unspecified sprain of right wrist, initial encounter: Secondary | ICD-10-CM

## 2013-02-03 DIAGNOSIS — S63509A Unspecified sprain of unspecified wrist, initial encounter: Secondary | ICD-10-CM

## 2013-02-03 NOTE — ED Notes (Signed)
Pt c/o RT hand injury x 1 day. She reports a hx of tendonitis in that hand.

## 2013-02-03 NOTE — ED Provider Notes (Signed)
History     CSN: [id number]  Arrival date & time 02/03/13  1108   None     Chief Complaint  Patient presents with  . Hand Injury       HPI Comments: Patient was carrying two reams of paper in a paper bag yesterday when the bag broke.  While attempting to catch the paper, she wrenched her right thumb and elbow which have remained painful today.  The pain radiates to her right elbow. She has a past history of right carpal tunnel syndrome.  Patient is a 52 y.o. female presenting with hand injury. The history is provided by the patient.  Hand Injury Location:  Wrist Time since incident:  1 day Injury: yes   Mechanism of injury comment:  Wrenched right wrist Wrist location:  R wrist Pain details:    Quality:  Aching   Radiates to:  R elbow   Severity:  Mild   Onset quality:  Sudden   Duration:  1 day   Timing:  Constant   Progression:  Unchanged Chronicity:  New Handedness:  Right-handed Dislocation: no   Prior injury to area:  No Relieved by:  Nothing Worsened by:  Movement Ineffective treatments:  None tried Associated symptoms: decreased range of motion, muscle weakness, stiffness and swelling   Associated symptoms: no numbness and no tingling     Past Medical History  Diagnosis Date  . Hypertension   . Asthma   . Seasonal allergies   . IBS (irritable bowel syndrome)   . Sciatica of left side   . Foot pain, right     Past Surgical History  Procedure Laterality Date  . Total abdominal hysterectomy w/ bilateral salpingoophorectomy  1989    LSO-1987; RsO-1998  . Knee surgery      right  . Appendectomy    . Cholecystectomy    . Tonsillectomy    . Wrist surgery      carpal tunnel repair  . Breast surgery  2001    /biopsy-benign  . Small bowel surgery    . Excisional hemorrhoidectomy    . Bladder suspension  2012    TVT  . Abdominal hysterectomy      Family History  Problem Relation Age of Onset  . Diabetes Father   . Hypertension Father   .  Diabetes Mother   . Hypertension Mother   . Diabetes Brother   . Hypertension Sister   . Diabetes Sister     History  Substance Use Topics  . Smoking status: Never Smoker   . Smokeless tobacco: Never Used  . Alcohol Use: No    OB History   Grav Para Term Preterm Abortions TAB SAB Ect Mult Living   2 2              Review of Systems  Musculoskeletal: Positive for stiffness.  All other systems reviewed and are negative.    Allergies  Aspirin and Cortisone  Home Medications   Current Outpatient Rx  Name  Route  Sig  Dispense  Refill  . Beclomethasone Dipropionate (QNASL) 80 MCG/ACT AERS   Nasal   Place into the nose.         . promethazine (PHENERGAN) 25 MG tablet   Oral   Take 25 mg by mouth every 6 (six) hours as needed for nausea.         Marland Kitchen UNABLE TO FIND      Med Name:          .  albuterol (PROVENTIL HFA;VENTOLIN HFA) 108 (90 BASE) MCG/ACT inhaler   Inhalation   Inhale 2 puffs into the lungs every 6 (six) hours as needed.         Marland Kitchen albuterol-ipratropium (COMBIVENT) 18-103 MCG/ACT inhaler   Inhalation   Inhale 2 puffs into the lungs daily.          Marland Kitchen amoxicillin-clavulanate (AUGMENTIN) 500-125 MG per tablet   Oral   Take 1 tablet by mouth 2 (two) times daily.         . Cetirizine HCl (ZYRTEC PO)   Oral   Take 1 tablet by mouth daily as needed. Patient uses this medication for her allergies.         . cholecalciferol (VITAMIN D) 1000 UNITS tablet   Oral   Take 1,000 Units by mouth daily.         Marland Kitchen EPINEPHrine (EPIPEN JR) 0.15 MG/0.3ML injection   Intramuscular   Inject 0.15 mg into the muscle as needed.         Marland Kitchen estradiol (ESTRACE) 0.1 MG/GM vaginal cream   Vaginal   Place 0.25 Applicatorfuls vaginally daily.   42.5 g   11   . ferrous sulfate 325 (65 FE) MG tablet   Oral   Take 325 mg by mouth daily with breakfast.         . HYDROcodone-acetaminophen (NORCO/VICODIN) 5-325 MG per tablet   Oral   Take 1-2 tablets by  mouth every 6 (six) hours as needed for pain.   30 tablet   0   . ibuprofen (ADVIL,MOTRIN) 200 MG tablet   Oral   Take 400 mg by mouth every 6 (six) hours as needed. Patient used this medication for her knee pain.         . Olmesartan Medoxomil (BENICAR PO)   Oral   Take 1 tablet by mouth daily. Unknown dose         . triamterene-hydrochlorothiazide (MAXZIDE) 75-50 MG per tablet   Oral   Take 0.5 tablets by mouth daily.            BP 119/72  Pulse 70  Temp(Src) 98.1 F (36.7 C) (Oral)  Ht 5' (1.524 m)  Wt 221 lb (100.245 kg)  BMI 43.16 kg/m2  SpO2 99%  Physical Exam  Nursing note and vitals reviewed. Constitutional: She is oriented to person, place, and time. She appears well-developed and well-nourished. No distress.  Patient is obese (BMI 43.2)  Eyes: Conjunctivae are normal. Pupils are equal, round, and reactive to light.  Musculoskeletal: Normal range of motion. She exhibits tenderness.       Right hand: She exhibits tenderness. She exhibits normal range of motion, no bony tenderness, normal two-point discrimination, normal capillary refill, no deformity, no laceration and no swelling. Normal sensation noted. Normal strength noted.  There is distinct tenderness over right thumb extensor tendons.  Distal neurovascular function is intact. There is also mild tenderness over right elbow lateral epicondyle.  Neurological: She is alert and oriented to person, place, and time.  Skin: Skin is warm and dry. No rash noted.    ED Course  Procedures  none      1. Right wrist sprain, initial encounter; patient has distinct tenderness over thumb extensor tendons, but probably does not yet have a full-blown de Quervain's tendonitis.  She also has mild tenderness over right lateral epicondyle.       MDM  Applied thumb spica splint. Wear wrist splint for about one week.  Apply  ice pack for 30 to 45 minutes every 1 to 4 hours.  Continue until swelling decreases.  Begin  Ibuprofen 200mg , 4 tabs every 8 hours with food.  In about 5 days, begin range of motion and stretching exercises as per instruction sheets. Because of mild tenderness over right lateral epicondyle, will also have her start elbow exercises. Followup with [facility] if not improving about two weeks.         Lattie Haw, MD 02/03/13 1212

## 2013-02-05 ENCOUNTER — Telehealth: Payer: Self-pay | Admitting: Emergency Medicine

## 2013-05-18 ENCOUNTER — Emergency Department (HOSPITAL_COMMUNITY)
Admission: EM | Admit: 2013-05-18 | Discharge: 2013-05-18 | Disposition: A | Discharge status: Home / Self Care | Payer: BC Managed Care – PPO | Attending: Emergency Medicine | Admitting: Emergency Medicine

## 2013-05-18 ENCOUNTER — Encounter (HOSPITAL_COMMUNITY): Payer: Self-pay

## 2013-05-18 DIAGNOSIS — K2970 Gastritis, unspecified, without bleeding: Secondary | ICD-10-CM

## 2013-05-18 DIAGNOSIS — Z79899 Other long term (current) drug therapy: Secondary | ICD-10-CM | POA: Insufficient documentation

## 2013-05-18 DIAGNOSIS — J45909 Unspecified asthma, uncomplicated: Secondary | ICD-10-CM | POA: Insufficient documentation

## 2013-05-18 DIAGNOSIS — K589 Irritable bowel syndrome without diarrhea: Secondary | ICD-10-CM | POA: Insufficient documentation

## 2013-05-18 DIAGNOSIS — Z8739 Personal history of other diseases of the musculoskeletal system and connective tissue: Secondary | ICD-10-CM | POA: Insufficient documentation

## 2013-05-18 DIAGNOSIS — I10 Essential (primary) hypertension: Secondary | ICD-10-CM | POA: Insufficient documentation

## 2013-05-18 DIAGNOSIS — J309 Allergic rhinitis, unspecified: Secondary | ICD-10-CM | POA: Insufficient documentation

## 2013-05-18 DIAGNOSIS — R110 Nausea: Secondary | ICD-10-CM | POA: Insufficient documentation

## 2013-05-18 DIAGNOSIS — Z888 Allergy status to other drugs, medicaments and biological substances status: Secondary | ICD-10-CM | POA: Insufficient documentation

## 2013-05-18 DIAGNOSIS — Z3202 Encounter for pregnancy test, result negative: Secondary | ICD-10-CM | POA: Insufficient documentation

## 2013-05-18 DIAGNOSIS — K2900 Acute gastritis without bleeding: Secondary | ICD-10-CM | POA: Insufficient documentation

## 2013-05-18 LAB — COMPREHENSIVE METABOLIC PANEL
ALT: 13 U/L (ref 0–35)
AST: 17 U/L (ref 0–37)
CO2: 27 mEq/L (ref 19–32)
Calcium: 9.9 mg/dL (ref 8.4–10.5)
Chloride: 102 mEq/L (ref 96–112)
GFR calc non Af Amer: 84 mL/min — ABNORMAL LOW (ref >90)
Potassium: 3.5 mEq/L (ref 3.5–5.1)
Sodium: 138 mEq/L (ref 135–145)
Total Bilirubin: 0.2 mg/dL — ABNORMAL LOW (ref 0.3–1.2)

## 2013-05-18 LAB — POCT PREGNANCY, URINE: Preg Test, Ur: NEGATIVE

## 2013-05-18 LAB — CBC WITH DIFFERENTIAL/PLATELET
Eosinophils Relative: 2 % (ref 0–5)
Monocytes Relative: 6 % (ref 3–12)
Neutrophils Relative %: 54 % (ref 43–77)
Platelets: 302 10*3/uL (ref 150–400)
RBC: 5.08 MIL/uL (ref 3.87–5.11)
WBC: 5.9 10*3/uL (ref 4.0–10.5)

## 2013-05-18 LAB — URINALYSIS, ROUTINE W REFLEX MICROSCOPIC
Glucose, UA: NEGATIVE mg/dL
Hgb urine dipstick: NEGATIVE
Protein, ur: NEGATIVE mg/dL

## 2013-05-18 MED ORDER — GI COCKTAIL ~~LOC~~
30.0000 mL | Freq: Once | ORAL | Status: AC
  Administered 2013-05-18: 30 mL via ORAL
  Filled 2013-05-18: qty 30

## 2013-05-18 MED ORDER — SODIUM CHLORIDE 0.9 % IV BOLUS (SEPSIS)
1000.0000 mL | Freq: Once | INTRAVENOUS | Status: AC
  Administered 2013-05-18: 1000 mL via INTRAVENOUS

## 2013-05-18 MED ORDER — FAMOTIDINE IN NACL 20-0.9 MG/50ML-% IV SOLN
20.0000 mg | Freq: Once | INTRAVENOUS | Status: AC
  Administered 2013-05-18: 20 mg via INTRAVENOUS
  Filled 2013-05-18: qty 50

## 2013-05-18 MED ORDER — FAMOTIDINE 20 MG PO TABS: 20.0000 mg | ORAL_TABLET | Freq: Two times a day (BID) | ORAL | Status: DC

## 2013-05-18 NOTE — ED Provider Notes (Signed)
CSN: [id number]     Arrival date & time 05/18/13  1240 History     First MD Initiated Contact with Patient 05/18/13 1537     Chief Complaint  Patient presents with  . Abdominal Pain   (Consider location/radiation/quality/duration/timing/severity/associated sxs/prior Treatment) The history is provided by the patient.  Mackenzie Weber is a 52 y.o. female hx of HTN, IBS here with abdominal pain. Epigastric pain for last 4 days. Worse when she eats. She also felt a little nauseous. Went to see PMD yesterday and was prescribed Phenergan and Nexium. She took Phenergan which helped the nausea but did not help her with pain. She has history of cholecystectomy as well as appendectomy. Denies any fevers or chills or vomiting or diarrhea.    Past Medical History  Diagnosis Date  . Hypertension   . Asthma   . Seasonal allergies   . IBS (irritable bowel syndrome)   . Sciatica of left side   . Foot pain, right    Past Surgical History  Procedure Laterality Date  . Total abdominal hysterectomy w/ bilateral salpingoophorectomy  1989    LSO-1987; RsO-1998  . Knee surgery      right  . Appendectomy    . Cholecystectomy    . Tonsillectomy    . Wrist surgery      carpal tunnel repair  . Breast surgery  2001    /biopsy-benign  . Small bowel surgery    . Excisional hemorrhoidectomy    . Bladder suspension  2012    TVT  . Abdominal hysterectomy     Family History  Problem Relation Age of Onset  . Diabetes Father   . Hypertension Father   . Diabetes Mother   . Hypertension Mother   . Diabetes Brother   . Hypertension Sister   . Diabetes Sister    History  Substance Use Topics  . Smoking status: Never Smoker   . Smokeless tobacco: Never Used  . Alcohol Use: No   OB History   Grav Para Term Preterm Abortions TAB SAB Ect Mult Living   2 2             Review of Systems  Gastrointestinal: Positive for abdominal pain.  All other systems reviewed and are negative.    Allergies   Aspirin and Cortisone  Home Medications   Current Outpatient Rx  Name  Route  Sig  Dispense  Refill  . albuterol (PROVENTIL HFA;VENTOLIN HFA) 108 (90 BASE) MCG/ACT inhaler   Inhalation   Inhale 2 puffs into the lungs every 6 (six) hours as needed for wheezing or shortness of breath.          Marland Kitchen albuterol-ipratropium (COMBIVENT) 18-103 MCG/ACT inhaler   Inhalation   Inhale 2 puffs into the lungs daily.          . Beclomethasone Dipropionate (QNASL) 80 MCG/ACT AERS   Nasal   Place 1 spray into the nose daily.          . cetirizine (ZYRTEC) 10 MG tablet   Oral   Take 10 mg by mouth daily.         . cholecalciferol (VITAMIN D) 1000 UNITS tablet   Oral   Take 1,000 Units by mouth daily.         Marland Kitchen EPINEPHrine (EPIPEN JR) 0.15 MG/0.3ML injection   Intramuscular   Inject 0.15 mg into the muscle daily as needed for anaphylaxis.          Marland Kitchen estradiol (  ESTRACE) 0.1 MG/GM vaginal cream   Vaginal   Place 0.25 Applicatorfuls vaginally daily.   42.5 g   11   . olmesartan (BENICAR) 20 MG tablet   Oral   Take 20 mg by mouth every morning.         . promethazine (PHENERGAN) 25 MG tablet   Oral   Take 25 mg by mouth every 6 (six) hours as needed for nausea.         Marland Kitchen triamterene-hydrochlorothiazide (MAXZIDE) 75-50 MG per tablet   Oral   Take 0.5 tablets by mouth daily.           BP 103/60  Pulse 72  Temp(Src) 97.8 F (36.6 C) (Oral)  Resp 22  SpO2 98% Physical Exam  Nursing note and vitals reviewed. Constitutional: She is oriented to person, place, and time. She appears well-developed and well-nourished.  Slightly uncomfortable   HENT:  Head: Normocephalic.  Mouth/Throat: Oropharynx is clear and moist.  Eyes: Conjunctivae are normal. Pupils are equal, round, and reactive to light.  Neck: Normal range of motion. Neck supple.  Cardiovascular: Normal rate, regular rhythm and normal heart sounds.   Pulmonary/Chest: Effort normal and breath sounds normal. No  respiratory distress. She has no wheezes. She has no rales.  Abdominal: Soft. Bowel sounds are normal.  Mild epigastric tenderness. No RUQ tenderness. No CVAT   Musculoskeletal: Normal range of motion. She exhibits no edema and no tenderness.  Neurological: She is alert and oriented to person, place, and time.  Skin: Skin is warm and dry.  Psychiatric: She has a normal mood and affect. Her behavior is normal. Judgment and thought content normal.    ED Course   Procedures (including critical care time)  Labs Reviewed  CBC WITH DIFFERENTIAL - Abnormal; Notable for the following:    Hemoglobin 10.9 (*)    HCT 34.0 (*)    MCV 66.9 (*)    MCH 21.5 (*)    RDW 16.0 (*)    All other components within normal limits  COMPREHENSIVE METABOLIC PANEL - Abnormal; Notable for the following:    Glucose, Bld 103 (*)    Total Bilirubin 0.2 (*)    GFR calc non Af Amer 84 (*)    All other components within normal limits  LIPASE, BLOOD  URINALYSIS, ROUTINE W REFLEX MICROSCOPIC  POCT PREGNANCY, URINE   No results found. No diagnosis found.  MDM  Mackenzie Weber is a 52 y.o. female here with epigastric pain. Likely gastritis. Labs including CMP and lipase nl. Felt better after GI cocktail and pepcid. Will d/c home on pepcid, maalox prn. She will take her nexium at home.    Richardean Canal, MD 05/18/13 [phone]

## 2013-05-18 NOTE — ED Notes (Signed)
Generalized abdominal pain began last Thursday,  Went to see [facility] yesterday and was diagnosed with gastritis.  Pt. Reports that the pain has increased after her eating today.   Denies any vomiting having nausea and diarrhea.

## 2014-05-14 LAB — HM COLONOSCOPY

## 2014-06-07 ENCOUNTER — Encounter (INDEPENDENT_AMBULATORY_CARE_PROVIDER_SITE_OTHER): Payer: Self-pay | Admitting: Surgery

## 2014-06-07 ENCOUNTER — Ambulatory Visit (INDEPENDENT_AMBULATORY_CARE_PROVIDER_SITE_OTHER): Payer: BC Managed Care – PPO | Admitting: Surgery

## 2014-06-07 VITALS — BP 122/80 | HR 76 | Temp 97.0°F | Ht 60.0 in | Wt 226.0 lb

## 2014-06-07 DIAGNOSIS — S39011A Strain of muscle, fascia and tendon of abdomen, initial encounter: Secondary | ICD-10-CM

## 2014-06-07 DIAGNOSIS — IMO0002 Reserved for concepts with insufficient information to code with codable children: Secondary | ICD-10-CM

## 2014-06-07 NOTE — Patient Instructions (Signed)

## 2014-06-07 NOTE — Progress Notes (Signed)
Patient ID: Mackenzie Weber, female   DOB: 21-Nov-1960, 53 y.o.   MRN: [id number]  Chief Complaint  Patient presents with  . eval pelvic cyst    HPI Mackenzie Weber is a 53 y.o. female.   HPI Patients at the request of [name] for lower abdominal pain. Patient is one-month history of suprapubic pain made worse with walking, lifting or straining. She has begun an exercise program and this seems to be when the pain started. Pain medication his suprapubic. It is sharp in nature made worse with activity. Pain improves with rest. Patient had abdominal MRI which was normal. Previous CT scan was normal as well. Patient with pelvic ultrasound which was normal. Denies any change in bowel function. No pain with defecation or constipation. No incomplete evacuation of rectum. Previous history of hysterectomy and bilateral oophorectomy. Past Medical History  Diagnosis Date  . Hypertension   . Asthma   . Seasonal allergies   . IBS (irritable bowel syndrome)   . Sciatica of left side   . Foot pain, right     Past Surgical History  Procedure Laterality Date  . Total abdominal hysterectomy w/ bilateral salpingoophorectomy  1989    LSO-1987; RsO-1998  . Knee surgery      right  . Appendectomy    . Cholecystectomy    . Tonsillectomy    . Wrist surgery      carpal tunnel repair  . Breast surgery  2001    /biopsy-benign  . Small bowel surgery    . Excisional hemorrhoidectomy    . Bladder suspension  2012    TVT  . Abdominal hysterectomy      Family History  Problem Relation Age of Onset  . Diabetes Father   . Hypertension Father   . Diabetes Mother   . Hypertension Mother   . Diabetes Brother   . Hypertension Sister   . Diabetes Sister     Social History History  Substance Use Topics  . Smoking status: Never Smoker   . Smokeless tobacco: Never Used  . Alcohol Use: No    Allergies  Allergen Reactions  . Aspirin Other (See Comments)    Rapid Heart beat  . Cortisone     Red  area around injection site x 31mth    Current Outpatient Prescriptions  Medication Sig Dispense Refill  . albuterol (PROVENTIL HFA;VENTOLIN HFA) 108 (90 BASE) MCG/ACT inhaler Inhale 2 puffs into the lungs every 6 (six) hours as needed for wheezing or shortness of breath.       Marland Kitchen albuterol-ipratropium (COMBIVENT) 18-103 MCG/ACT inhaler Inhale 2 puffs into the lungs daily.       . Beclomethasone Dipropionate (QNASL) 80 MCG/ACT AERS Place 1 spray into the nose daily.       . cetirizine (ZYRTEC) 10 MG tablet Take 10 mg by mouth daily.      . cholecalciferol (VITAMIN D) 1000 UNITS tablet Take 1,000 Units by mouth daily.      Marland Kitchen EPINEPHrine (EPIPEN JR) 0.15 MG/0.3ML injection Inject 0.15 mg into the muscle daily as needed for anaphylaxis.       Marland Kitchen estradiol (ESTRACE) 0.1 MG/GM vaginal cream Place 7.37 Applicatorfuls vaginally daily.  42.5 g  11  . famotidine (PEPCID) 20 MG tablet Take 1 tablet (20 mg total) by mouth 2 (two) times daily.  30 tablet  0  . olmesartan (BENICAR) 20 MG tablet Take 20 mg by mouth every morning.      . promethazine (PHENERGAN)  25 MG tablet Take 25 mg by mouth every 6 (six) hours as needed for nausea.      Marland Kitchen triamterene-hydrochlorothiazide (MAXZIDE) 75-50 MG per tablet Take 0.5 tablets by mouth daily.        No current facility-administered medications for this visit.    Review of Systems Review of Systems  Constitutional: Negative for fever, chills and unexpected weight change.  HENT: Negative for congestion, hearing loss, sore throat, trouble swallowing and voice change.   Eyes: Negative for visual disturbance.  Respiratory: Negative for cough and wheezing.   Cardiovascular: Negative for chest pain, palpitations and leg swelling.  Gastrointestinal: Positive for abdominal pain. Negative for nausea, vomiting, diarrhea, constipation, blood in stool, abdominal distention and anal bleeding.  Genitourinary: Negative for hematuria, vaginal bleeding and difficulty urinating.    Musculoskeletal: Positive for back pain, gait problem and myalgias. Negative for arthralgias.  Skin: Negative for rash and wound.  Neurological: Negative for seizures, syncope and headaches.  Hematological: Negative for adenopathy. Does not bruise/bleed easily.  Psychiatric/Behavioral: Negative for confusion.    Blood pressure 122/80, pulse 76, temperature 97 F (36.1 C), height 5' (1.524 m), weight 226 lb (102.513 kg).  Physical Exam Physical Exam  Constitutional: She is oriented to person, place, and time. She appears well-developed and well-nourished.  HENT:  Head: Normocephalic.  Mouth/Throat: No oropharyngeal exudate.  Eyes: Pupils are equal, round, and reactive to light. No scleral icterus.  Neck: Normal range of motion.  Cardiovascular: Normal rate.   Pulmonary/Chest: Effort normal and breath sounds normal.  Abdominal: There is no rebound. No hernia. Hernia confirmed negative in the ventral area, confirmed negative in the right inguinal area and confirmed negative in the left inguinal area.    Neurological: She is alert and oriented to person, place, and time.  Skin: Skin is warm and dry.  Psychiatric: She has a normal mood and affect. Her behavior is normal. Judgment and thought content normal.    Data Reviewed [name] notes,  MRI abdomen pelvis 8/15  Pelvic U/S CT a/p Assessment    Abdominal wall strain without evidence of hernia    Plan    Recommend medical management consisting of nonsteroidal anti-inflammatory medication, heat and ice and rest. No surgical indication this point. Adhesional/pelvic pain does not present like this. Recommend weight loss. Followup as needed.       [name]. 06/07/2014, 4:03 PM

## 2014-06-18 ENCOUNTER — Encounter (HOSPITAL_BASED_OUTPATIENT_CLINIC_OR_DEPARTMENT_OTHER): Payer: Self-pay | Admitting: Emergency Medicine

## 2014-06-18 ENCOUNTER — Emergency Department (HOSPITAL_BASED_OUTPATIENT_CLINIC_OR_DEPARTMENT_OTHER)
Admission: EM | Admit: 2014-06-18 | Discharge: 2014-06-18 | Disposition: A | Discharge status: Home / Self Care | Payer: BC Managed Care – PPO | Attending: Emergency Medicine | Admitting: Emergency Medicine

## 2014-06-18 DIAGNOSIS — J45901 Unspecified asthma with (acute) exacerbation: Secondary | ICD-10-CM | POA: Diagnosis not present

## 2014-06-18 DIAGNOSIS — R0602 Shortness of breath: Secondary | ICD-10-CM | POA: Diagnosis present

## 2014-06-18 DIAGNOSIS — Z8739 Personal history of other diseases of the musculoskeletal system and connective tissue: Secondary | ICD-10-CM | POA: Diagnosis not present

## 2014-06-18 DIAGNOSIS — Z8719 Personal history of other diseases of the digestive system: Secondary | ICD-10-CM | POA: Diagnosis not present

## 2014-06-18 DIAGNOSIS — I10 Essential (primary) hypertension: Secondary | ICD-10-CM | POA: Diagnosis not present

## 2014-06-18 DIAGNOSIS — IMO0002 Reserved for concepts with insufficient information to code with codable children: Secondary | ICD-10-CM | POA: Insufficient documentation

## 2014-06-18 DIAGNOSIS — Z79899 Other long term (current) drug therapy: Secondary | ICD-10-CM | POA: Diagnosis not present

## 2014-06-18 MED ORDER — PREDNISONE 50 MG PO TABS: ORAL_TABLET | ORAL | Status: DC

## 2014-06-18 MED ORDER — IPRATROPIUM-ALBUTEROL 0.5-2.5 (3) MG/3ML IN SOLN
3.0000 mL | Freq: Once | RESPIRATORY_TRACT | Status: AC
  Administered 2014-06-18: 3 mL via RESPIRATORY_TRACT

## 2014-06-18 MED ORDER — IPRATROPIUM-ALBUTEROL 0.5-2.5 (3) MG/3ML IN SOLN
RESPIRATORY_TRACT | Status: AC
  Filled 2014-06-18: qty 3

## 2014-06-18 MED ORDER — PREDNISONE 50 MG PO TABS
60.0000 mg | ORAL_TABLET | Freq: Once | ORAL | Status: AC
  Administered 2014-06-18: 60 mg via ORAL
  Filled 2014-06-18 (×2): qty 1

## 2014-06-18 MED ORDER — ALBUTEROL SULFATE (2.5 MG/3ML) 0.083% IN NEBU
2.5000 mg | INHALATION_SOLUTION | Freq: Once | RESPIRATORY_TRACT | Status: AC
  Administered 2014-06-18: 2.5 mg via RESPIRATORY_TRACT

## 2014-06-18 MED ORDER — ALBUTEROL SULFATE (2.5 MG/3ML) 0.083% IN NEBU
INHALATION_SOLUTION | RESPIRATORY_TRACT | Status: AC
  Filled 2014-06-18: qty 3

## 2014-06-18 MED ORDER — IPRATROPIUM-ALBUTEROL 18-103 MCG/ACT IN AERO: 2.0000 | INHALATION_SPRAY | Freq: Four times a day (QID) | RESPIRATORY_TRACT | Status: DC | PRN

## 2014-06-18 NOTE — Discharge Instructions (Signed)

## 2014-06-18 NOTE — ED Provider Notes (Signed)
CSN: [id number]     Arrival date & time 06/18/14  0031 History   First MD Initiated Contact with Patient 06/18/14 0055     Chief Complaint  Patient presents with  . Shortness of Breath      Patient is a 53 y.o. female presenting with shortness of breath. The history is provided by the patient.  Shortness of Breath Severity:  Moderate Onset quality:  Gradual Duration:  4 days Timing:  Intermittent Progression:  Worsening Chronicity:  New Relieved by:  Inhaler Worsened by:  Activity (lying flat) Associated symptoms: fever, sputum production and wheezing   Associated symptoms: no chest pain, no hemoptysis and no vomiting   Pt reports cough/wheezing that started over 4 days ago Seen by PCP and was given "steroid" shot with some improvement but now her symptoms are worsening No cp No LE edema No h/o ICU admission for asthma  Past Medical History  Diagnosis Date  . Hypertension   . Asthma   . Seasonal allergies   . IBS (irritable bowel syndrome)   . Sciatica of left side   . Foot pain, right    Past Surgical History  Procedure Laterality Date  . Total abdominal hysterectomy w/ bilateral salpingoophorectomy  1989    LSO-1987; RsO-1998  . Knee surgery      right  . Appendectomy    . Cholecystectomy    . Tonsillectomy    . Wrist surgery      carpal tunnel repair  . Breast surgery  2001    /biopsy-benign  . Small bowel surgery    . Excisional hemorrhoidectomy    . Bladder suspension  2012    TVT  . Abdominal hysterectomy     Family History  Problem Relation Age of Onset  . Diabetes Father   . Hypertension Father   . Diabetes Mother   . Hypertension Mother   . Diabetes Brother   . Hypertension Sister   . Diabetes Sister    History  Substance Use Topics  . Smoking status: Never Smoker   . Smokeless tobacco: Never Used  . Alcohol Use: No   OB History   Grav Para Term Preterm Abortions TAB SAB Ect Mult Living   2 2             Review of Systems   Constitutional: Positive for fever.  Respiratory: Positive for sputum production, shortness of breath and wheezing. Negative for hemoptysis.   Cardiovascular: Negative for chest pain and leg swelling.  Gastrointestinal: Negative for vomiting.  All other systems reviewed and are negative.     Allergies  Aspirin and Cortisone  Home Medications   Prior to Admission medications   Medication Sig Start Date End Date Taking? Authorizing Provider  albuterol (PROVENTIL HFA;VENTOLIN HFA) 108 (90 BASE) MCG/ACT inhaler Inhale 2 puffs into the lungs every 6 (six) hours as needed for wheezing or shortness of breath.     Historical Provider, MD  albuterol-ipratropium (COMBIVENT) 18-103 MCG/ACT inhaler Inhale 2 puffs into the lungs every 6 (six) hours as needed for wheezing or shortness of breath. 06/18/14   Sharyon Cable, MD  Beclomethasone Dipropionate (QNASL) 80 MCG/ACT AERS Place 1 spray into the nose daily.     Historical Provider, MD  cetirizine (ZYRTEC) 10 MG tablet Take 10 mg by mouth daily.    Historical Provider, MD  cholecalciferol (VITAMIN D) 1000 UNITS tablet Take 1,000 Units by mouth daily.    Historical Provider, MD  EPINEPHrine (EPIPEN JR) 0.15  MG/0.3ML injection Inject 0.15 mg into the muscle daily as needed for anaphylaxis.     Historical Provider, MD  estradiol (ESTRACE) 0.1 MG/GM vaginal cream Place 0.09 Applicatorfuls vaginally daily. 11/03/12   Elmira Powell, PA-C  olmesartan (BENICAR) 20 MG tablet Take 20 mg by mouth every morning.    Historical Provider, MD  predniSONE (DELTASONE) 50 MG tablet One tablet PO daily for 4 days 06/18/14   Sharyon Cable, MD  triamterene-hydrochlorothiazide (MAXZIDE) 75-50 MG per tablet Take 0.5 tablets by mouth daily.     Historical Provider, MD   BP 142/81  Pulse 70  Temp(Src) 98.3 F (36.8 C) (Oral)  Resp 18  Wt 226 lb (102.513 kg)  SpO2 99% Physical Exam CONSTITUTIONAL: Well developed/well nourished HEAD: Normocephalic/atraumatic EYES:  EOMI/PERRL ENMT: Mucous membranes moist, uvula midline, no erythema or exudates noted NECK: supple no meningeal signs SPINE:entire spine nontender CV: S1/S2 noted, no murmurs/rubs/gallops noted LUNGS: scattered wheezing noted (nebulizer in process) no apparent distress ABDOMEN: soft, nontender, no rebound or guarding GU:no cva tenderness NEURO: Pt is awake/alert, moves all extremitiesx4 EXTREMITIES: pulses normal, full ROM SKIN: warm, color normal PSYCH: no abnormalities of mood noted  ED Course  Procedures  EKG Interpretation   Date/Time:  Friday June 18 2014 01:21:24 EDT Ventricular Rate:  75 PR Interval:  142 QRS Duration: 76 QT Interval:  384 QTC Calculation: 428 R Axis:   54 Text Interpretation:  Normal sinus rhythm Nonspecific ST and T wave  abnormality Abnormal ECG No significant change since last tracing  Confirmed by Christy Gentles  MD, Charina Fons ([id number]) on 06/18/2014 1:23:39 AM       2:11 AM Pt resting comfortably and she is feeling improved She requests Refill of her combivent Will start oral prednisone as she tolerated this in the ED Stable for d/c home MDM   Final diagnoses:  Asthma attack   Nursing notes including past medical history and social history reviewed and considered in documentation     Sharyon Cable, MD 06/18/14 0211

## 2014-06-18 NOTE — ED Notes (Signed)
Pt. Reports she started having shortness of breath with lying down to rest last night.

## 2014-08-03 ENCOUNTER — Encounter (HOSPITAL_BASED_OUTPATIENT_CLINIC_OR_DEPARTMENT_OTHER): Payer: Self-pay | Admitting: Emergency Medicine

## 2014-08-03 ENCOUNTER — Emergency Department (HOSPITAL_BASED_OUTPATIENT_CLINIC_OR_DEPARTMENT_OTHER)
Admission: EM | Admit: 2014-08-03 | Discharge: 2014-08-04 | Disposition: A | Discharge status: Home / Self Care | Payer: BC Managed Care – PPO | Attending: Emergency Medicine | Admitting: Emergency Medicine

## 2014-08-03 ENCOUNTER — Emergency Department (HOSPITAL_BASED_OUTPATIENT_CLINIC_OR_DEPARTMENT_OTHER): Payer: BC Managed Care – PPO

## 2014-08-03 DIAGNOSIS — M25462 Effusion, left knee: Secondary | ICD-10-CM | POA: Diagnosis not present

## 2014-08-03 DIAGNOSIS — Z79899 Other long term (current) drug therapy: Secondary | ICD-10-CM | POA: Diagnosis not present

## 2014-08-03 DIAGNOSIS — J45909 Unspecified asthma, uncomplicated: Secondary | ICD-10-CM | POA: Insufficient documentation

## 2014-08-03 DIAGNOSIS — Z8719 Personal history of other diseases of the digestive system: Secondary | ICD-10-CM | POA: Insufficient documentation

## 2014-08-03 DIAGNOSIS — M79662 Pain in left lower leg: Secondary | ICD-10-CM | POA: Diagnosis present

## 2014-08-03 DIAGNOSIS — I10 Essential (primary) hypertension: Secondary | ICD-10-CM | POA: Diagnosis not present

## 2014-08-03 MED ORDER — KETOROLAC TROMETHAMINE 60 MG/2ML IM SOLN
60.0000 mg | Freq: Once | INTRAMUSCULAR | Status: AC
  Administered 2014-08-03: 60 mg via INTRAMUSCULAR
  Filled 2014-08-03: qty 2

## 2014-08-03 NOTE — ED Notes (Signed)
Pt c/o left lower leg pain and swelling x 1 week  Recent travel.

## 2014-08-04 ENCOUNTER — Encounter (HOSPITAL_BASED_OUTPATIENT_CLINIC_OR_DEPARTMENT_OTHER): Payer: Self-pay | Admitting: Emergency Medicine

## 2014-08-04 DIAGNOSIS — M25462 Effusion, left knee: Secondary | ICD-10-CM | POA: Diagnosis not present

## 2014-08-04 MED ORDER — TRAMADOL HCL 50 MG PO TABS
50.0000 mg | ORAL_TABLET | Freq: Once | ORAL | Status: AC
  Administered 2014-08-04: 50 mg via ORAL
  Filled 2014-08-04: qty 1

## 2014-08-04 MED ORDER — TRAMADOL HCL 50 MG PO TABS: 50.0000 mg | ORAL_TABLET | Freq: Four times a day (QID) | ORAL | Status: DC | PRN

## 2014-08-04 MED ORDER — MELOXICAM 15 MG PO TABS: 15.0000 mg | ORAL_TABLET | Freq: Every day | ORAL | Status: DC

## 2014-08-04 NOTE — ED Provider Notes (Signed)
CSN: [id number]     Arrival date & time 08/03/14  2234 History   First MD Initiated Contact with Patient 08/03/14 2309     Chief Complaint  Patient presents with  . Leg Pain     (Consider location/radiation/quality/duration/timing/severity/associated sxs/prior Treatment) Patient is a 53 y.o. female presenting with leg pain. The history is provided by the patient.  Leg Pain Location:  Leg Injury: no   Leg location:  L lower leg Pain details:    Quality:  Aching   Radiates to:  Does not radiate   Severity:  Severe   Onset quality:  Sudden   Timing:  Constant   Progression:  Worsening Chronicity:  New Dislocation: no   Relieved by:  Nothing Worsened by:  Nothing tried Ineffective treatments:  None tried Associated symptoms: no back pain, no fever and no numbness   Risk factors: no concern for non-accidental trauma     Past Medical History  Diagnosis Date  . Hypertension   . Asthma   . Seasonal allergies   . IBS (irritable bowel syndrome)   . Sciatica of left side   . Foot pain, right    Past Surgical History  Procedure Laterality Date  . Total abdominal hysterectomy w/ bilateral salpingoophorectomy  1989    LSO-1987; RsO-1998  . Knee surgery      right  . Appendectomy    . Cholecystectomy    . Tonsillectomy    . Wrist surgery      carpal tunnel repair  . Breast surgery  2001    /biopsy-benign  . Small bowel surgery    . Excisional hemorrhoidectomy    . Bladder suspension  2012    TVT  . Abdominal hysterectomy     Family History  Problem Relation Age of Onset  . Diabetes Father   . Hypertension Father   . Diabetes Mother   . Hypertension Mother   . Diabetes Brother   . Hypertension Sister   . Diabetes Sister    History  Substance Use Topics  . Smoking status: Never Smoker   . Smokeless tobacco: Never Used  . Alcohol Use: No   OB History   Grav Para Term Preterm Abortions TAB SAB Ect Mult Living   2 2             Review of Systems   Constitutional: Negative for fever.  Musculoskeletal: Negative for back pain.  All other systems reviewed and are negative.     Allergies  Aspirin and Cortisone  Home Medications   Prior to Admission medications   Medication Sig Start Date End Date Taking? Authorizing Provider  albuterol (PROVENTIL HFA;VENTOLIN HFA) 108 (90 BASE) MCG/ACT inhaler Inhale 2 puffs into the lungs every 6 (six) hours as needed for wheezing or shortness of breath.     Historical Provider, MD  albuterol-ipratropium (COMBIVENT) 18-103 MCG/ACT inhaler Inhale 2 puffs into the lungs every 6 (six) hours as needed for wheezing or shortness of breath. 06/18/14   Sharyon Cable, MD  Beclomethasone Dipropionate (QNASL) 80 MCG/ACT AERS Place 1 spray into the nose daily.     Historical Provider, MD  cetirizine (ZYRTEC) 10 MG tablet Take 10 mg by mouth daily.    Historical Provider, MD  cholecalciferol (VITAMIN D) 1000 UNITS tablet Take 1,000 Units by mouth daily.    Historical Provider, MD  EPINEPHrine (EPIPEN JR) 0.15 MG/0.3ML injection Inject 0.15 mg into the muscle daily as needed for anaphylaxis.     Historical  Provider, MD  estradiol (ESTRACE) 0.1 MG/GM vaginal cream Place 0.24 Applicatorfuls vaginally daily. 11/03/12   Elmira Powell, PA-C  olmesartan (BENICAR) 20 MG tablet Take 20 mg by mouth every morning.    Historical Provider, MD  predniSONE (DELTASONE) 50 MG tablet One tablet PO daily for 4 days 06/18/14   Sharyon Cable, MD  triamterene-hydrochlorothiazide (MAXZIDE) 75-50 MG per tablet Take 0.5 tablets by mouth daily.     Historical Provider, MD   BP 141/78  Pulse 92  Temp(Src) 98.3 F (36.8 C) (Oral)  Resp 16  Ht 5' (1.524 m)  Wt 226 lb (102.513 kg)  BMI 44.14 kg/m2  SpO2 99% Physical Exam  Constitutional: She is oriented to person, place, and time. She appears well-developed and well-nourished. No distress.  HENT:  Head: Normocephalic and atraumatic.  Eyes: Conjunctivae and EOM are normal.  Neck:  Normal range of motion. Neck supple.  Cardiovascular: Normal rate, regular rhythm and intact distal pulses.   Pulmonary/Chest: Effort normal and breath sounds normal. She has no wheezes. She has no rales.  Abdominal: Soft. Bowel sounds are normal. There is no tenderness. There is no rebound and no guarding.  Musculoskeletal: Normal range of motion. She exhibits no tenderness.       Left knee: She exhibits normal range of motion, no swelling, no effusion, no ecchymosis, no deformity, no laceration, no erythema, normal alignment, no LCL laxity, normal patellar mobility, no bony tenderness and normal meniscus. No tenderness found. No medial joint line, no lateral joint line, no MCL, no LCL and no patellar tendon tenderness noted.       Left ankle: Achilles tendon normal.       Left lower leg: She exhibits no tenderness, no bony tenderness, no edema, no deformity and no laceration.       Left foot: She exhibits normal range of motion, no bony tenderness, no swelling, normal capillary refill and no crepitus.  Neurological: She is alert and oriented to person, place, and time. She has normal reflexes.  Skin: Skin is warm and dry.  Psychiatric: She has a normal mood and affect.    ED Course  Procedures (including critical care time) Labs Review Labs Reviewed - No data to display  Imaging Review US Venous Img Lower Unilateral Left  08/03/2014   CLINICAL DATA:  53 year old female with acute lower extremity pain and swelling. Initial encounter.  EXAM: LEFT LOWER EXTREMITY VENOUS DOPPLER ULTRASOUND  TECHNIQUE: Gray-scale sonography with graded compression, as well as color Doppler and duplex ultrasound were performed to evaluate the lower extremity deep venous systems from the level of the common femoral vein and including the common femoral, femoral, profunda femoral, popliteal and calf veins including the posterior tibial, peroneal and gastrocnemius veins when visible. The superficial great saphenous  vein was also interrogated. Spectral Doppler was utilized to evaluate flow at rest and with distal augmentation maneuvers in the common femoral, femoral and popliteal veins.  COMPARISON:  12/24/2007.  FINDINGS: Common Femoral Vein: No evidence of thrombus. Normal compressibility, respiratory phasicity and response to augmentation.  Saphenofemoral Junction: No evidence of thrombus. Normal compressibility and flow on color Doppler imaging.  Profunda Femoral Vein: No evidence of thrombus. Normal compressibility and flow on color Doppler imaging.  Femoral Vein: No evidence of thrombus. Normal compressibility, respiratory phasicity and response to augmentation.  Popliteal Vein: No evidence of thrombus. Normal compressibility, respiratory phasicity and response to augmentation.  Calf Veins: No evidence of thrombus. Normal compressibility and flow on color Doppler  imaging.  Superficial Great Saphenous Vein: No evidence of thrombus. Normal compressibility and flow on color Doppler imaging.  Venous Reflux:  None.  Other Findings: Scanning in the area of pain designated the left knee superior to the patella also performed. There is an 8-10 mm thick fluid collection identified superior to the patella (image 31). This fluid tracks a distance of 5-6 cm.  IMPRESSION: 1.  No evidence of left lower extremity deep venous thrombosis. 2. Suprapatellar joint effusion.   Electronically Signed   By: Lars Pinks M.D.   On: 08/03/2014 23:46     EKG Interpretation None      MDM   Final diagnoses:  None    Will treat with ice elevation NSAIDs and close follow up with PMD and prn follow up with orthopedics    Chantilly Linskey Alfonso Patten, MD 08/04/14 6754

## 2014-08-04 NOTE — ED Notes (Signed)
Pt discharged to home NAD.  

## 2014-08-16 ENCOUNTER — Encounter (HOSPITAL_BASED_OUTPATIENT_CLINIC_OR_DEPARTMENT_OTHER): Payer: Self-pay | Admitting: Emergency Medicine

## 2014-10-22 ENCOUNTER — Encounter: Payer: Self-pay | Admitting: Internal Medicine

## 2014-10-22 ENCOUNTER — Encounter: Payer: Self-pay | Admitting: *Deleted

## 2014-10-22 ENCOUNTER — Ambulatory Visit (INDEPENDENT_AMBULATORY_CARE_PROVIDER_SITE_OTHER): Payer: BLUE CROSS/BLUE SHIELD | Admitting: Internal Medicine

## 2014-10-22 VITALS — BP 110/64 | HR 81 | Temp 97.6°F | Resp 18 | Ht 60.0 in | Wt 229.4 lb

## 2014-10-22 DIAGNOSIS — S39011A Strain of muscle, fascia and tendon of abdomen, initial encounter: Secondary | ICD-10-CM

## 2014-10-22 DIAGNOSIS — M5432 Sciatica, left side: Secondary | ICD-10-CM

## 2014-10-22 DIAGNOSIS — J45909 Unspecified asthma, uncomplicated: Secondary | ICD-10-CM

## 2014-10-22 DIAGNOSIS — I10 Essential (primary) hypertension: Secondary | ICD-10-CM | POA: Insufficient documentation

## 2014-10-22 DIAGNOSIS — J4530 Mild persistent asthma, uncomplicated: Secondary | ICD-10-CM | POA: Insufficient documentation

## 2014-10-22 NOTE — Progress Notes (Signed)
Patient ID: Mackenzie Weber, female   DOB: [birth year], 54 y.o.   MRN: [id number]    Facility  [facility]    Place of Service:   OFFICE   Allergies  Allergen Reactions  . Cortisone     Red area around injection site x 2mth  . Depo-Medrol [Methylprednisolone Acetate] Nausea Only and Other (See Comments)    Dizziness  . Aspirin Anxiety and Other (See Comments)    Rapid Heart beat    Chief Complaint  Patient presents with  . Establish Care    HPI:  54 yo female seen today for above. She has hx HTN, left sciatica, abdominal muscle strain, allergic rhinitis, asthma, HRT. Since I last saw her, she had LLE strain that req'd crutches. She also notes increased issues with her asthma with DOE. She underwent stress test, CT chest,  ECG show no acute changes. She has not had PFTs or seen pulmonary. No CP but has chest tightness and uses HFA prn. No palpitations. She saw ENT last fall and was given anithistamine. No recent falls. (+) numbness in LLE. Abdominal pain improved after resuming exercise routine last week. No longer takes muscle relaxer or pain med  Medications: Patient's Medications  New Prescriptions   No medications on file  Previous Medications   ALBUTEROL (PROVENTIL HFA;VENTOLIN HFA) 108 (90 BASE) MCG/ACT INHALER    Inhale 2 puffs into the lungs every 6 (six) hours as needed for wheezing or shortness of breath.    CETIRIZINE (ZYRTEC) 10 MG TABLET    Take 10 mg by mouth daily.   CHOLECALCIFEROL (VITAMIN D) 1000 UNITS TABLET    Take 1,000 Units by mouth daily.   EPINEPHRINE (EPIPEN JR) 0.15 MG/0.3ML INJECTION    Inject 0.15 mg into the muscle daily as needed for anaphylaxis.    ESTRADIOL (ESTRACE) 0.1 MG/GM VAGINAL CREAM    Place 2.37 Applicatorfuls vaginally daily.   TRIAMTERENE-HYDROCHLOROTHIAZIDE (MAXZIDE) 75-50 MG PER TABLET    Take 0.5 tablets by mouth daily.   Modified Medications   No medications on file  Discontinued Medications   ALBUTEROL-IPRATROPIUM (COMBIVENT) 18-103  MCG/ACT INHALER    Inhale 2 puffs into the lungs every 6 (six) hours as needed for wheezing or shortness of breath.   BECLOMETHASONE DIPROPIONATE (QNASL) 80 MCG/ACT AERS    Place 1 spray into the nose daily.    MELOXICAM (MOBIC) 15 MG TABLET    Take 1 tablet (15 mg total) by mouth daily.   OLMESARTAN (BENICAR) 20 MG TABLET    Take 20 mg by mouth every morning.   PREDNISONE (DELTASONE) 50 MG TABLET    One tablet PO daily for 4 days   TRAMADOL (ULTRAM) 50 MG TABLET    Take 1 tablet (50 mg total) by mouth every 6 (six) hours as needed for severe pain.     Review of Systems  As above. All other systems reviewed are negative.  Filed Vitals:   10/22/14 0854  BP: 110/64  Pulse: 81  Temp: 97.6 F (36.4 C)  TempSrc: Oral  Resp: 18  Height: 5' (1.524 m)  Weight: 229 lb 6.4 oz (104.055 kg)  SpO2: 95%   Body mass index is 44.8 kg/(m^2).  Physical Exam  CONSTITUTIONAL: Looks well in NAD. Awake, alert and oriented x 3 HEENT: PERRLA. Oropharynx clear and without exudate. No mucosal lesions noted NECK: Supple. Nontender. No palpable cervical or supraclavicular lymph nodes. No carotid bruit b/l. No thyromegaly or thyroid mass palpable.  CVS: Regular rate without  murmur, gallop or rub. LUNGS: CTA b/l no wheezing, rales or rhonchi. ABDOMEN: Bowel sounds present x 4. Soft, nondistended. No palpable mass or bruit. Epigastric TTP but no r/g/r. Obese EXTREMITIES: No edema b/l. Distal pulses palpable. No calf tenderness. MUSC: (+) left piriformis TTP; left knee swelling with reduced ROM; antalgic gait PSYCH: Affect, behavior and mood normal   Labs reviewed: No visits with results within 3 Month(s) from this visit. Latest known visit with results is:     Assessment/Plan    ICD-9-CM ICD-10-CM   1. Sciatica of left side 724.3 M54.32   2. Asthma, chronic, unspecified asthma severity, uncomplicated 507.22 V75.051   3. Essential hypertension 401.9 I10   4. Abdominal muscle strain, initial  encounter 848.8 S39.011A    - obtain old records  -will consider PFTs once provider is changed on insurance  - RTO next week for OMT for sciatica  - continue exercise routine as tolerated   Etter Royall S. Perlie Gold  [facility] and Adult Medicine [address] [place], [place] [address] [phone] Office (Wednesdays and Fridays 8 AM - 5 PM) [phone] Cell (Monday-Friday 8 AM - 5 PM)

## 2014-10-27 ENCOUNTER — Ambulatory Visit (INDEPENDENT_AMBULATORY_CARE_PROVIDER_SITE_OTHER): Payer: BLUE CROSS/BLUE SHIELD | Admitting: Internal Medicine

## 2014-10-27 ENCOUNTER — Encounter: Payer: Self-pay | Admitting: Internal Medicine

## 2014-10-27 VITALS — BP 122/80 | HR 78 | Temp 97.5°F | Resp 20 | Ht 60.0 in | Wt 229.4 lb

## 2014-10-27 DIAGNOSIS — M79605 Pain in left leg: Secondary | ICD-10-CM

## 2014-10-27 DIAGNOSIS — M9906 Segmental and somatic dysfunction of lower extremity: Secondary | ICD-10-CM

## 2014-10-27 DIAGNOSIS — M9901 Segmental and somatic dysfunction of cervical region: Secondary | ICD-10-CM

## 2014-10-27 DIAGNOSIS — M5431 Sciatica, right side: Secondary | ICD-10-CM

## 2014-10-27 DIAGNOSIS — M9903 Segmental and somatic dysfunction of lumbar region: Secondary | ICD-10-CM

## 2014-10-27 DIAGNOSIS — M9904 Segmental and somatic dysfunction of sacral region: Secondary | ICD-10-CM

## 2014-10-27 DIAGNOSIS — M9905 Segmental and somatic dysfunction of pelvic region: Secondary | ICD-10-CM

## 2014-10-27 NOTE — Progress Notes (Signed)
Patient ID: Mackenzie Weber, female   DOB: 31-May-1961, 54 y.o.   MRN: [id number]    Facility  [facility]    Place of Service:   OFFICE   Allergies  Allergen Reactions  . Cortisone     Red area around injection site x 20mth  . Depo-Medrol [Methylprednisolone Acetate] Nausea Only and Other (See Comments)    Dizziness  . Aspirin Anxiety and Other (See Comments)    Rapid Heart beat    Chief Complaint  Patient presents with  . Medical Management of Chronic Issues    left sciatica and hip pain    HPI:  54 yo female seen today for OMM. She has chronic back and hip pain. She reports no recent falls. Denies loss of bowel/bladder control. No new numbness/tingling. She does have intermittent dizziness and sinus x few days.  Medications: Patient's Medications  New Prescriptions   No medications on file  Previous Medications   ALBUTEROL (PROVENTIL HFA;VENTOLIN HFA) 108 (90 BASE) MCG/ACT INHALER    Inhale 2 puffs into the lungs every 6 (six) hours as needed for wheezing or shortness of breath.    CARISOPRODOL (SOMA) 350 MG TABLET    Take 350 mg by mouth 3 (three) times daily.   CETIRIZINE (ZYRTEC) 10 MG TABLET    Take 10 mg by mouth daily.   CHOLECALCIFEROL (VITAMIN D) 1000 UNITS TABLET    Take 1,000 Units by mouth daily.   EPINEPHRINE (EPIPEN JR) 0.15 MG/0.3ML INJECTION    Inject 0.15 mg into the muscle daily as needed for anaphylaxis.    ESTRADIOL (ESTRACE) 0.1 MG/GM VAGINAL CREAM    Place 5.79 Applicatorfuls vaginally daily.   FLUTICASONE-SALMETEROL (ADVAIR HFA) 115-21 MCG/ACT INHALER    Inhale into the lungs 2 (two) times daily. At 12 hour intervals   IBUPROFEN (ADVIL,MOTRIN) 800 MG TABLET    Take 800 mg by mouth every 8 (eight) hours as needed.   LOSARTAN-HYDROCHLOROTHIAZIDE (HYZAAR) 100-12.5 MG PER TABLET    Take 1 tablet by mouth daily.   TRIAMTERENE-HYDROCHLOROTHIAZIDE (MAXZIDE) 75-50 MG PER TABLET    Take 0.5 tablets by mouth daily.   Modified Medications   No medications on file    Discontinued Medications   No medications on file     Review of Systems   As above. All other systems reviewed are negative  Filed Vitals:   10/27/14 1306  BP: 122/80  Pulse: 78  Temp: 97.5 F (36.4 C)  TempSrc: Oral  Resp: 20  Height: 5' (1.524 m)  Weight: 229 lb 6.4 oz (104.055 kg)  SpO2: 99%   Body mass index is 44.8 kg/(m^2).  Physical Exam  HENT:  Head:    Nose: Right sinus exhibits no maxillary sinus tenderness. Left sinus exhibits maxillary sinus tenderness.  Musculoskeletal:       Back:       Legs: (+) standing flexion test on right; left SI joint restriction; left pelvic inflare; short left leg; reduced R>L hip ROM; left knee swelling with reduced flexion and noticeable grinding with ROM; OA extended; reduced right neck ROM; paravertebral lumbar, thoracic and cervical muscle hypertrophy with ropy tissue texture changes; strength intact; no distal swelling;     Labs reviewed: No visits with results within 3 Month(s) from this visit. Latest known visit with results is:    Assessment/Plan    ICD-9-CM ICD-10-CM   1. Left leg pain 729.5 M79.605   2. Chronic sciatica, right 724.3 M54.31   3. Severe obesity (BMI >= 40)  278.01 E66.01   4. Somatic dysfunction of lower extremity 739.6 M99.06   5. Somatic dysfunction of lumbar region 739.3 M99.03   6. Somatic dysfunction of pelvic region 739.5 M99.05   7. Somatic dysfunction of cervical region 739.1 M99.01   8. Somatic dysfunction of sacral region 739.4 M99.04     PROCEDURE NOTE:   After verbal consent obtained, OMT utilized (ST, MFR, LAS, ME, CS) with improvement in ROM, tissue texture, asymmetry and tenderness. Pt tolerated procedure well.  -push fluids and rest today. No heavy lifting x 24 hrs and then may resume nml activity   RTO in 1 week for re-eval   Jaelin Fackler S. Perlie Gold  [facility] and Adult Medicine [address] [place], [place] [address] [phone]  Office (Wednesdays and Fridays 8 AM - 5 PM) [phone] Cell (Monday-Friday 8 AM - 5 PM)

## 2014-10-29 ENCOUNTER — Encounter: Payer: Self-pay | Admitting: Internal Medicine

## 2014-10-29 NOTE — Patient Instructions (Signed)
--  push fluids and rest today. No heavy lifting x 24 hrs and then may resume nml activity  RTO in 1 week for re-eval

## 2014-11-05 ENCOUNTER — Encounter: Payer: BLUE CROSS/BLUE SHIELD | Admitting: Internal Medicine

## 2014-11-10 ENCOUNTER — Encounter: Payer: Self-pay | Admitting: Internal Medicine

## 2014-11-10 ENCOUNTER — Ambulatory Visit (INDEPENDENT_AMBULATORY_CARE_PROVIDER_SITE_OTHER): Payer: BLUE CROSS/BLUE SHIELD | Admitting: Internal Medicine

## 2014-11-10 VITALS — BP 110/84 | HR 63 | Temp 97.8°F | Resp 20 | Ht 60.0 in | Wt 226.0 lb

## 2014-11-10 DIAGNOSIS — M9906 Segmental and somatic dysfunction of lower extremity: Secondary | ICD-10-CM

## 2014-11-10 DIAGNOSIS — M9905 Segmental and somatic dysfunction of pelvic region: Secondary | ICD-10-CM

## 2014-11-10 DIAGNOSIS — M9903 Segmental and somatic dysfunction of lumbar region: Secondary | ICD-10-CM

## 2014-11-10 DIAGNOSIS — M5432 Sciatica, left side: Secondary | ICD-10-CM

## 2014-11-10 DIAGNOSIS — M9904 Segmental and somatic dysfunction of sacral region: Secondary | ICD-10-CM

## 2014-11-10 DIAGNOSIS — M9901 Segmental and somatic dysfunction of cervical region: Secondary | ICD-10-CM

## 2014-11-10 DIAGNOSIS — J45909 Unspecified asthma, uncomplicated: Secondary | ICD-10-CM

## 2014-11-10 NOTE — Progress Notes (Signed)
Patient ID: Mackenzie Weber, female   DOB: August 04, 1961, 54 y.o.   MRN: [id number]    Facility  [facility]       Place of Service:   OFFICE   Allergies  Allergen Reactions  . Cortisone     Red area around injection site x 57mth  . Depo-Medrol [Methylprednisolone Acetate] Nausea Only and Other (See Comments)    Dizziness  . Aspirin Anxiety and Other (See Comments)    Rapid Heart beat    Chief Complaint  Patient presents with  . Medical Management of Chronic Issues    sciatica, low back pain, knee pain, obesity    HPI:  54 yo female seen today for above. She has sinus pressure and nasal congestion. Uses HFA once per day. O2 sat reportedly low (80s%) while at GYN office on yesterday. No recent PFTs.   Back pain improved. LLE still painful but better. She has left knee pain and unsure if sleeping wrong. No numbness or tingling. No loss of bowel/bladder control. Pain is 5/10 on scale. No falls or other trauma. She is back to exercise routine  Medications: Patient's Medications  New Prescriptions   No medications on file  Previous Medications   ALBUTEROL (PROVENTIL HFA;VENTOLIN HFA) 108 (90 BASE) MCG/ACT INHALER    Inhale 2 puffs into the lungs every 6 (six) hours as needed for wheezing or shortness of breath.    CARISOPRODOL (SOMA) 350 MG TABLET    Take 350 mg by mouth 3 (three) times daily.   CETIRIZINE (ZYRTEC) 10 MG TABLET    Take 10 mg by mouth daily.   CHOLECALCIFEROL (VITAMIN D) 1000 UNITS TABLET    Take 1,000 Units by mouth daily.   EPINEPHRINE (EPIPEN JR) 0.15 MG/0.3ML INJECTION    Inject 0.15 mg into the muscle daily as needed for anaphylaxis.    ESTRADIOL (ESTRACE) 0.1 MG/GM VAGINAL CREAM    Place 6.76 Applicatorfuls vaginally daily.   FLUTICASONE-SALMETEROL (ADVAIR HFA) 115-21 MCG/ACT INHALER    Inhale into the lungs 2 (two) times daily. At 12 hour intervals   IBUPROFEN (ADVIL,MOTRIN) 800 MG TABLET    Take 800 mg by mouth every 8 (eight) hours as needed.   LOSARTAN-HYDROCHLOROTHIAZIDE (HYZAAR) 100-12.5 MG PER TABLET    Take 1 tablet by mouth daily.   TRIAMTERENE-HYDROCHLOROTHIAZIDE (MAXZIDE) 75-50 MG PER TABLET    Take 0.5 tablets by mouth daily.   Modified Medications   No medications on file  Discontinued Medications   No medications on file     Review of Systems  As above. All other systems reviewed are negative  Filed Vitals:   11/10/14 1309  BP: 110/84  Pulse: 63  Temp: 97.8 F (36.6 C)  TempSrc: Oral  Resp: 20  Height: 5' (1.524 m)  Weight: 226 lb (102.513 kg)  SpO2: 99%   Body mass index is 44.14 kg/(m^2).  Physical Exam  Constitutional:     CONSTITUTIONAL: Looks well in NAD. Awake, alert and oriented x 3 CVS: Regular rate without murmur, gallop or rub. LUNGS: CTA b/l no wheezing, rales or rhonchi. EXTREMITIES: +1 pitting LE edema b/l. Distal pulses palpable. No calf tenderness. No palpable cords PSYCH: Affect, behavior and mood normal MUSC: (+) right standing flexion test; left short leg; right pelvic outflare with L>R SI joint restriction and R>L ASIS TTP; left knee swelling with reduced flexion/extension and popliteal fossa TTP; sacral torsion; paravertebral lumbar, thoracic and cervical muscle hypertrophy with ropy tissue texture changes; right PSIS TTP; OA extended; left  posterior scalene TP; reduced R>L neck rotation; antalgic gait   Labs reviewed: No visits with results within 3 Month(s) from this visit.      Assessment/Plan   ICD-9-CM ICD-10-CM   1. Asthma, chronic, unspecified asthma severity, uncomplicated 865.78 I69.629   2. Sciatica of left side- improving 724.3 M54.32   3. Severe obesity (BMI >= 40) 278.01 E66.01   4. Somatic dysfunction of lower extremity 739.6 M99.06   5. Somatic dysfunction of pelvic region 739.5 M99.05   6. Somatic dysfunction of lumbar region 739.3 M99.03   7. Somatic dysfunction of sacral region 739.4 M99.04   8. Somatic dysfunction of cervical region 739.1 M99.01     --push fluids and rest today. May resume nml activity in the AM  --continue exercise routine as tolerated  --nutrition eval for obesity  --PFTs to assess severity of asthma. Continue HFA prn  --1 week f/u for OMM  PROCEDURE:   After verbal consent obtained, OMT utilized (ST, MFR, LAS, ME, CS) with improvement in ROM, tissue texture, asymmetry and tenderness. Pt tolerated procedure well.   Kahlie Deutscher S. Perlie Gold  [facility] and Adult Medicine [address] [place], [place] [address] [phone] Office (Wednesdays and Fridays 8 AM - 5 PM) [phone] Cell (Monday-Friday 8 AM - 5 PM)

## 2014-11-10 NOTE — Patient Instructions (Signed)
Push fluids and rest today. May resume normal activity in the morning  Continue current medications as ordered  Continue exercise routine as tolerated  RTO in 1 week for re-eval

## 2014-11-11 ENCOUNTER — Ambulatory Visit (INDEPENDENT_AMBULATORY_CARE_PROVIDER_SITE_OTHER): Payer: BC Managed Care – PPO | Admitting: Internal Medicine

## 2014-11-11 ENCOUNTER — Other Ambulatory Visit: Payer: Self-pay | Admitting: Internal Medicine

## 2014-11-11 DIAGNOSIS — R0600 Dyspnea, unspecified: Secondary | ICD-10-CM

## 2014-11-11 LAB — PULMONARY FUNCTION TEST
DL/VA % pred: 135 %
DL/VA: 5.76 ml/min/mmHg/L
DLCO unc % pred: 93 %
DLCO unc: 17.67 ml/min/mmHg
FEF 25-75 Post: 1.34 L/s
FEF 25-75 Pre: 1.09 L/s
FEF2575-%Change-Post: 22 %
FEF2575-%Pred-Post: 64 %
FEF2575-%Pred-Pre: 52 %
FEV1-%Change-Post: 5 %
FEV1-%Pred-Post: 68 %
FEV1-%Pred-Pre: 65 %
FEV1-Post: 1.32 L
FEV1-Pre: 1.25 L
FEV1FVC-%Change-Post: 3 %
FEV1FVC-%Pred-Pre: 97 %
FEV6-%Change-Post: 1 %
FEV6-%Pred-Post: 69 %
FEV6-%Pred-Pre: 68 %
FEV6-Post: 1.62 L
FEV6-Pre: 1.59 L
FEV6FVC-%Pred-Post: 103 %
FEV6FVC-%Pred-Pre: 103 %
FVC-%Change-Post: 1 %
FVC-%Pred-Post: 67 %
FVC-%Pred-Pre: 66 %
FVC-Post: 1.62 L
FVC-Pre: 1.59 L
Post FEV1/FVC ratio: 82 %
Post FEV6/FVC ratio: 100 %
Pre FEV1/FVC ratio: 79 %
Pre FEV6/FVC Ratio: 100 %

## 2014-11-11 NOTE — Progress Notes (Signed)
PFT done today. 

## 2014-11-13 ENCOUNTER — Other Ambulatory Visit: Payer: Self-pay | Admitting: Internal Medicine

## 2014-11-17 ENCOUNTER — Encounter: Payer: BC Managed Care – PPO | Attending: Internal Medicine | Admitting: Dietician

## 2014-11-17 ENCOUNTER — Ambulatory Visit (INDEPENDENT_AMBULATORY_CARE_PROVIDER_SITE_OTHER): Payer: BLUE CROSS/BLUE SHIELD | Admitting: Internal Medicine

## 2014-11-17 ENCOUNTER — Encounter: Payer: Self-pay | Admitting: Internal Medicine

## 2014-11-17 ENCOUNTER — Encounter: Payer: Self-pay | Admitting: Dietician

## 2014-11-17 VITALS — BP 122/58 | HR 77 | Temp 97.7°F | Resp 12 | Ht 60.0 in | Wt 215.0 lb

## 2014-11-17 DIAGNOSIS — Z6841 Body Mass Index (BMI) 40.0 and over, adult: Secondary | ICD-10-CM | POA: Insufficient documentation

## 2014-11-17 DIAGNOSIS — Z713 Dietary counseling and surveillance: Secondary | ICD-10-CM | POA: Diagnosis not present

## 2014-11-17 DIAGNOSIS — M9906 Segmental and somatic dysfunction of lower extremity: Secondary | ICD-10-CM

## 2014-11-17 DIAGNOSIS — M9904 Segmental and somatic dysfunction of sacral region: Secondary | ICD-10-CM

## 2014-11-17 DIAGNOSIS — M5432 Sciatica, left side: Secondary | ICD-10-CM

## 2014-11-17 DIAGNOSIS — J45909 Unspecified asthma, uncomplicated: Secondary | ICD-10-CM

## 2014-11-17 DIAGNOSIS — M9903 Segmental and somatic dysfunction of lumbar region: Secondary | ICD-10-CM

## 2014-11-17 DIAGNOSIS — M9905 Segmental and somatic dysfunction of pelvic region: Secondary | ICD-10-CM

## 2014-11-17 DIAGNOSIS — M9902 Segmental and somatic dysfunction of thoracic region: Secondary | ICD-10-CM

## 2014-11-17 MED ORDER — TIOTROPIUM BROMIDE MONOHYDRATE 18 MCG IN CAPS: 18.0000 ug | ORAL_CAPSULE | Freq: Every morning | RESPIRATORY_TRACT | Status: DC

## 2014-11-17 NOTE — Patient Instructions (Addendum)
-  Start eating breakfast with protein  -Frozen waffle with low sodium [place] bacon   -Try having a low carb protein shake for breakfast ([employer] or Atkins shake)  -[facility] Protein bar  -Have a protein food with every meal and snack (cheese, eggs, meat, nuts, peanut butter)  -Avoid fried food  -Choose baked, broiled, grilled, steamed, or sauteed  -Be as active as possible  -Avoid sweetened drinks -Drink mostly water!  -Pre portion snacks  -Fill up on nonstarchy vegetables

## 2014-11-17 NOTE — Patient Instructions (Signed)
Push fluids and rest today. May resume normal activity in the AM  Gentle stretching prior to exercise routine.  Pulmonary eval pending  Rinse mouth after each use of inhaler  F/u in 1 week for OMM

## 2014-11-17 NOTE — Progress Notes (Signed)
  Medical Nutrition Therapy:  Appt start time: 0240 end time:  330   Assessment:  Primary concerns today: Mackenzie Weber states that she is here today to "get in control of her health." She recently retired but still works in Personal assistant. She has 2 adult children and 2 grandchildren. Lost weight in the past by doing water aerobics and controlling portions. She has also done Weight Watchers in the past. Hasn't been able to exercise much lately due to pulled muscles.  Preferred Learning Style:   No preference indicated   Learning Readiness:   Ready  MEDICATIONS: see list   DIETARY INTAKE:  Usual eating pattern includes 2 meals and 1 snacks per day.  Avoided foods include liver, oysters, scallops, chitterlings.  Lactose intolerant but can tolerate cheese.    24-hr recall:  B ( AM): skips  Snk ( AM): none  L ( PM): chickfila salad or nuggets and fries OR [place] takeout OR [employer] with water Snk ( PM):  D ( PM): cooks 3 days a week; crockpot chicken, rice, cabbage or green beans OR pork ribs OR spaghetti or lasagna with water or sweet tea mixed with lemonade Snk ( PM): starburst or skittles or grapes or rice krispy treats or fruit snacks  Beverages: water, sweet tea mixed with lemonade, Dr. Malachi Bonds  Usual physical activity: water aerobic or gym or workout videos (3x a week)  Estimated energy needs: 1500-1700 calories  Progress Towards Goal(s):  In progress.   Nutritional Diagnosis:  [place]-3.3 Overweight/obesity As related to erratic meal pattern and excessive carbohydrate intake.  As evidenced by BMI 45.    Intervention:  Nutrition counseling provided. -Start eating breakfast with protein  -Frozen waffle with low sodium [place] bacon   -Try having a low carb protein shake for breakfast ([employer] or Atkins shake)  -[facility] Protein bar -Have a protein food with every meal and snack (cheese, eggs, meat, nuts, peanut butter) -Avoid fried food  -Choose baked, broiled, grilled,  steamed, or sauteed -Be as active as possible -Avoid sweetened drinks -Drink mostly water! -Pre portion snacks -Fill up on nonstarchy vegetables  Teaching Method Utilized:  Visual Auditory  Handouts given during visit include:  MyPlate  Snack list  Barriers to learning/adherence to lifestyle change: leg pain  Demonstrated degree of understanding via:  Teach Back   Monitoring/Evaluation:  Dietary intake, exercise, and body weight in 4 week(s).

## 2014-11-17 NOTE — Progress Notes (Signed)
Patient ID: Mackenzie Weber, female   DOB: 06/19/1961, 54 y.o.   MRN: [id number]    Facility  [facility]    Place of Service:   OFFICE   Allergies  Allergen Reactions  . Cortisone     Red area around injection site x 29mth  . Depo-Medrol [Methylprednisolone Acetate] Nausea Only and Other (See Comments)    Dizziness  . Aspirin Anxiety and Other (See Comments)    Rapid Heart beat    Chief Complaint  Patient presents with  . OMM    Left sciatica worsening  . Shortness of Breath    Patient c/o of SOB with or without activity x 4 days; hx asthma    HPI:  54 yo female seen today for OMM. She c/o SOB with orthopnea x 3-4 days. She notices increased chest congestion. Believes she is getting an URI. Feels nauseated from NSAIDs and pain med. No CP but has tightness. No emesis.  She had severe LLE pain on Monday after prolonged standing the day before. She was seen at [facility]. Leg [place] neg DVT. Given 60mg  kenalog IM which helped. She also took muscle relaxer and tramadol which helped. No numbness or tingling. Pain is 8/10. No loss of bowel/bladder control.  She is scheduled to see nutritionist today to discuss obesity  Medications: Patient's Medications  New Prescriptions   TIOTROPIUM (SPIRIVA HANDIHALER) 18 MCG INHALATION CAPSULE    Place 1 capsule (18 mcg total) into inhaler and inhale every morning.  Previous Medications   ALBUTEROL (PROVENTIL HFA;VENTOLIN HFA) 108 (90 BASE) MCG/ACT INHALER    Inhale 2 puffs into the lungs every 6 (six) hours as needed for wheezing or shortness of breath.    CARISOPRODOL (SOMA) 350 MG TABLET    Take 350 mg by mouth 3 (three) times daily.   CETIRIZINE (ZYRTEC) 10 MG TABLET    Take 10 mg by mouth daily.   CHOLECALCIFEROL (VITAMIN D) 1000 UNITS TABLET    Take 1,000 Units by mouth daily.   EPINEPHRINE (EPIPEN JR) 0.15 MG/0.3ML INJECTION    Inject 0.15 mg into the muscle daily as needed for anaphylaxis.    ESTRADIOL (ESTRACE) 0.1 MG/GM VAGINAL  CREAM    Place 8.41 Applicatorfuls vaginally daily.   FLUTICASONE-SALMETEROL (ADVAIR HFA) 115-21 MCG/ACT INHALER    Inhale into the lungs 2 (two) times daily. At 12 hour intervals   IBUPROFEN (ADVIL,MOTRIN) 800 MG TABLET    Take 800 mg by mouth every 8 (eight) hours as needed.   LOSARTAN-HYDROCHLOROTHIAZIDE (HYZAAR) 100-12.5 MG PER TABLET    Take 1 tablet by mouth daily.   TRIAMTERENE-HYDROCHLOROTHIAZIDE (MAXZIDE) 75-50 MG PER TABLET    Take 0.5 tablets by mouth daily.   Modified Medications   No medications on file  Discontinued Medications   No medications on file     Review of Systems  As above. All other systems reviewed negative  Filed Vitals:   11/17/14 1307  BP: 122/58  Pulse: 77  Temp: 97.7 F (36.5 C)  TempSrc: Oral  Resp: 12  Height: 5' (1.524 m)  Weight: 215 lb (97.523 kg)  SpO2: 97%   Body mass index is 41.99 kg/(m^2).  Physical Exam  Constitutional:     GEN: looks uncomfortable with min conversational dyspnea. Awake and alert CVS: RR with 1/6 SEM. No g/r LUNGS: reduced BS at base b/l but no w/r/r. Prolonged expiratory phase EXT: +1 pitting leg/foot edema. Distal pulses palpable MUSC: (+) left standing flexion test; short right leg;  sacral torsion; left pelvic inflare with SI joint restriction and ASIS TTP; left popliteal fossa TTP with hypertrophy of tendons and reduced ROM left knee; left calf muscle tight; left medial malleolus TTP; increased lumbar lordosis; multiple L>R sternal TPs;  T7 RL and TTP with paravertebral thoracic muscle hypertrophy and ropy tissue texture changes; lumbar paravertebral muscle hypertrophy with ropy tissue texture changes; antalgic gait; strength reduced in LLE; distal pulses palpable  Procedure results reviewed: Clinical Support on 11/11/2014  Component Date Value Ref Range Status  . FVC-Pre 11/11/2014 1.59   Final  . FVC-%Pred-Pre 11/11/2014 66   Final  . FVC-Post 11/11/2014 1.62   Final  . FVC-%Pred-Post 11/11/2014 67   Final   . FVC-%Change-Post 11/11/2014 1   Final  . FEV1-Pre 11/11/2014 1.25   Final  . FEV1-%Pred-Pre 11/11/2014 65   Final  . FEV1-Post 11/11/2014 1.32   Final  . FEV1-%Pred-Post 11/11/2014 68   Final  . FEV1-%Change-Post 11/11/2014 5   Final  . FEV6-Pre 11/11/2014 1.59   Final  . FEV6-%Pred-Pre 11/11/2014 68   Final  . FEV6-Post 11/11/2014 1.62   Final  . FEV6-%Pred-Post 11/11/2014 69   Final  . FEV6-%Change-Post 11/11/2014 1   Final  . Pre FEV1/FVC ratio 11/11/2014 79   Final  . FEV1FVC-%Pred-Pre 11/11/2014 97   Final  . Post FEV1/FVC ratio 11/11/2014 82   Final  . FEV1FVC-%Change-Post 11/11/2014 3   Final  . Pre FEV6/FVC Ratio 11/11/2014 100   Final  . FEV6FVC-%Pred-Pre 11/11/2014 103   Final  . Post FEV6/FVC ratio 11/11/2014 100   Final  . FEV6FVC-%Pred-Post 11/11/2014 103   Final  . FEF 25-75 Pre 11/11/2014 1.09   Final  . FEF2575-%Pred-Pre 11/11/2014 52   Final  . FEF 25-75 Post 11/11/2014 1.34   Final  . FEF2575-%Pred-Post 11/11/2014 64   Final  . FEF2575-%Change-Post 11/11/2014 22   Final  . DLCO unc 11/11/2014 17.67   Final  . DLCO unc % pred 11/11/2014 93   Final  . DL/VA 11/11/2014 5.76   Final  . DL/VA % pred 11/11/2014 135   Final     Assessment/Plan    ICD-9-CM ICD-10-CM   1. Asthma, chronic, unspecified asthma severity, uncomplicated- uncontrolled with moderate obstruction 493.90 J45.909 tiotropium (SPIRIVA HANDIHALER) 18 MCG inhalation capsule  2. Sciatica of left side - worse 724.3 M54.32   3. Severe obesity (BMI >= 40) 278.01 E66.01   4. Somatic dysfunction of lower extremity 739.6 M99.06   5. Somatic dysfunction of pelvic region 739.5 M99.05   6. Somatic dysfunction of lumbar region 739.3 M99.03   7. Somatic dysfunction of thoracic region 739.2 M99.02   8.      Somatic dysfunction of sacral region     739.4   M99.04   --will start spiriva 1 cap inhaled daily and rinse mouth after each use. Continue advair and prn albuterol. pulm appt pending  --continue  pain control and muscle relaxer prn  --f/u with nutritionist today for obesity   PROCEDURE: OMT  After verbal consent obtained, OMT utilized (ST, MFR, ME, LAS, thoracic pump) with improvement in ROM, tissue texture, asymmetry and tenderness. Inspiratory effort improved. Pt tolerated procedure well.  --no heavy lifting today. May resume nml activity in the AM --gentle stretching prior to beginning exercise routine --push f;luids and rest today --RTO in 1 week for re-eval   [facility] [address]  [place], [place] [address] [phone] Office (Wednesdays and Fridays 8 AM - 5 PM) [phone] Cell (Monday-Friday 8 AM - 5 PM)

## 2014-11-19 ENCOUNTER — Encounter: Payer: BLUE CROSS/BLUE SHIELD | Admitting: Internal Medicine

## 2014-12-01 ENCOUNTER — Encounter: Payer: Self-pay | Admitting: Internal Medicine

## 2014-12-01 ENCOUNTER — Ambulatory Visit (INDEPENDENT_AMBULATORY_CARE_PROVIDER_SITE_OTHER): Payer: BLUE CROSS/BLUE SHIELD | Admitting: Internal Medicine

## 2014-12-01 VITALS — BP 100/70 | HR 82 | Temp 97.4°F | Resp 20 | Ht 60.0 in | Wt 230.4 lb

## 2014-12-01 DIAGNOSIS — M9905 Segmental and somatic dysfunction of pelvic region: Secondary | ICD-10-CM

## 2014-12-01 DIAGNOSIS — M9904 Segmental and somatic dysfunction of sacral region: Secondary | ICD-10-CM | POA: Diagnosis not present

## 2014-12-01 DIAGNOSIS — M5432 Sciatica, left side: Secondary | ICD-10-CM

## 2014-12-01 DIAGNOSIS — M9906 Segmental and somatic dysfunction of lower extremity: Secondary | ICD-10-CM | POA: Diagnosis not present

## 2014-12-01 DIAGNOSIS — M9903 Segmental and somatic dysfunction of lumbar region: Secondary | ICD-10-CM

## 2014-12-01 DIAGNOSIS — J45909 Unspecified asthma, uncomplicated: Secondary | ICD-10-CM

## 2014-12-01 NOTE — Progress Notes (Signed)
Patient ID: Mackenzie Weber, female   DOB: 05-10-1961, 54 y.o.   MRN: [id number]    Facility  [facility]    Place of Service:   OFFICE   Allergies  Allergen Reactions  . Cortisone     Red area around injection site x 42mth  . Depo-Medrol [Methylprednisolone Acetate] Nausea Only and Other (See Comments)    Dizziness  . Aspirin Anxiety and Other (See Comments)    Rapid Heart beat    Chief Complaint  Patient presents with  . Medical Management of Chronic Issues    HPI:  54 yo female seen today for OMM. She has chronic sciatica. Since her last OV, she rec'd left knee injection by Ortho Dr Baron Hamper. Xrays of lumbar spine revealed no arthritis. She was told she may need MRI. She was doing well up until 2/14th when she was back to crutches again. Pain is 3/10 on scale today. No numbness or tingling. No recent falls. No loss of bowel/bladder control.  Breathing is improved with spiriva. She takes it in the afternoon so that it does not cause HA.   She did see nutritionist and plans to execute her diet plan. She still cannot exercise due to her pain   Medications: Patient's Medications  New Prescriptions   No medications on file  Previous Medications   ALBUTEROL (PROVENTIL HFA;VENTOLIN HFA) 108 (90 BASE) MCG/ACT INHALER    Inhale 2 puffs into the lungs every 6 (six) hours as needed for wheezing or shortness of breath.    CARISOPRODOL (SOMA) 350 MG TABLET    Take 350 mg by mouth 3 (three) times daily.   CETIRIZINE (ZYRTEC) 10 MG TABLET    Take 10 mg by mouth daily.   CHOLECALCIFEROL (VITAMIN D) 1000 UNITS TABLET    Take 1,000 Units by mouth daily.   EPINEPHRINE (EPIPEN JR) 0.15 MG/0.3ML INJECTION    Inject 0.15 mg into the muscle daily as needed for anaphylaxis.    ESTRADIOL (ESTRACE) 0.1 MG/GM VAGINAL CREAM    Place 7.85 Applicatorfuls vaginally daily.   FLUTICASONE-SALMETEROL (ADVAIR HFA) 115-21 MCG/ACT INHALER    Inhale into the lungs 2 (two) times daily. At 12 hour intervals   IBUPROFEN  (ADVIL,MOTRIN) 800 MG TABLET    Take 800 mg by mouth every 8 (eight) hours as needed.   LOSARTAN-HYDROCHLOROTHIAZIDE (HYZAAR) 100-12.5 MG PER TABLET    Take 1 tablet by mouth daily.   TIOTROPIUM (SPIRIVA HANDIHALER) 18 MCG INHALATION CAPSULE    Place 1 capsule (18 mcg total) into inhaler and inhale every morning.   TRIAMTERENE-HYDROCHLOROTHIAZIDE (MAXZIDE) 75-50 MG PER TABLET    Take 0.5 tablets by mouth daily.   Modified Medications   No medications on file  Discontinued Medications   No medications on file     Review of Systems  As above. All other systems reviewed are negative.  Filed Vitals:   12/01/14 1308  BP: 100/70  Pulse: 82  Temp: 97.4 F (36.3 C)  TempSrc: Oral  Resp: 20  Height: 5' (1.524 m)  Weight: 230 lb 6.4 oz (104.509 kg)  SpO2: 95%   Body mass index is 45 kg/(m^2).  Physical Exam  Constitutional: She appears well-developed and well-nourished. No distress.  Musculoskeletal: She exhibits edema and tenderness.       Left knee: She exhibits decreased range of motion and swelling. She exhibits no effusion, no deformity, no erythema, normal alignment, no LCL laxity, normal patellar mobility, no bony tenderness, normal meniscus and no MCL laxity. Tenderness  found. Medial joint line, lateral joint line and patellar tendon tenderness noted.       Back:       Legs: (+) left standing flexion test; no short leg; left pelvic inflare with SI joint restriction and ASIS TTP; antalgic gait; increased lumbar lordosis; paravertebral lumbar muscle hypertrophy and ropy tissue texture changes; strength reduced; trace LLE edema; distal pulses palpable     Labs reviewed: none    Assessment/Plan   ICD-9-CM ICD-10-CM   1. Sciatica of left side with left knee pain and swelling 724.3 M54.32   2. Somatic dysfunction of lower extremity 739.6 M99.06   3. Somatic dysfunction of pelvic region 739.5 M99.05   4. Somatic dysfunction of lumbar region 739.3 M99.03   5. Somatic  dysfunction of sacral region 739.4 M99.04   6.      Obesity - unchanged. She is unable to exercise due to her pain. Continue f/u with nutrition 7.       Asthma - improved sx's on advair and spiriva  --overall she is improving  PROCEDURE NOTE  After verbal consent obtained, OMT utilized with improvement in ROM, tissue texture, asymmetry and tenderness. Pt tolerated procedure well.  --push fluids and rest today. No heavy lifting today and my resume nml activity in the AM  --continue pain control and muscle relaxers  --RTO in 1-2 weeks for re-eval  OMT Treatment 12/01/2014  Lumbar LAS;MFR;ST  Lumbar Response I  Sacrum MFR;ST  Sacrum Response I  Pelvis ME;LAS;MFR;ST  Pelvis Response I  Lower Ext. LAS;MFR;ST  Lower Ext. Response I    Barclay Lennox S. Perlie Gold  [facility] [address] [place], [place] [address] [phone] Office (Wednesdays and Fridays 8 AM - 5 PM) [phone] Cell (Monday-Friday 8 AM - 5 PM)

## 2014-12-14 ENCOUNTER — Encounter: Payer: BC Managed Care – PPO | Attending: Internal Medicine | Admitting: Dietician

## 2014-12-14 DIAGNOSIS — Z6841 Body Mass Index (BMI) 40.0 and over, adult: Secondary | ICD-10-CM | POA: Diagnosis not present

## 2014-12-14 DIAGNOSIS — Z713 Dietary counseling and surveillance: Secondary | ICD-10-CM | POA: Insufficient documentation

## 2014-12-14 NOTE — Patient Instructions (Signed)
-  Start eating breakfast with protein  -Frozen waffle with low sodium [place] bacon   -Try having a low carb protein shake for breakfast ([employer] or Atkins shake)  -[facility] Protein bar  -Have a protein food with every meal and snack (cheese, eggs, meat, nuts, peanut butter)  -Avoid fried food  -Choose baked, broiled, grilled, steamed, or sauteed  -Be as active as possible  -Avoid sweetened drinks -Drink mostly water!  -Pre portion snacks  -Fill up on nonstarchy vegetables

## 2014-12-14 NOTE — Progress Notes (Signed)
  Medical Nutrition Therapy:  Appt start time: 7579 end time: 1210   Follow up:  Primary concerns today: Mackenzie Weber returns having maintained her weight. She thinks she is retaining water as her lower extremities are swollen. She reports that she has changed her eating habits. Has been doing more food shopping and keeping healthier foods in the house. Watching portions and avoiding fried foods. Goes to water aerobics and drinks Atkins shakes prior to working out. Trying to drink mainly water but occasionally has sweet tea and soda. Husband is being supportive as well.    Plan: continue with current goals  Preferred Learning Style:   No preference indicated   Learning Readiness:   Ready  MEDICATIONS: see list   DIETARY INTAKE:  Usual eating pattern includes 2 meals and 1 snacks per day.  Avoided foods include liver, oysters, scallops, chitterlings.  Lactose intolerant but can tolerate cheese.    24-hr recall:  B ( AM): Atkins shake Snk ( AM): none  L ( PM): salmon, green beans, and mashed potatoes Snk ( PM):  D ( PM): cooks 3 days a week; crockpot chicken, rice, cabbage or green beans OR pork ribs OR spaghetti or lasagna with water or sweet tea mixed with lemonade Snk ( PM): starburst or skittles or grapes or rice krispy treats or fruit snacks  Beverages: water, sweet tea mixed with lemonade, Dr. Malachi Bonds  Usual physical activity: water aerobic or gym or workout videos (3x a week)  Estimated energy needs: 1500-1700 calories  Progress Towards Goal(s):  In progress.   Nutritional Diagnosis:  [place]-3.3 Overweight/obesity As related to erratic meal pattern and excessive carbohydrate intake.  As evidenced by BMI 45.    Intervention:  Nutrition counseling provided.  Teaching Method Utilized:  Visual Auditory  Handouts given during visit include:  Barriers to learning/adherence to lifestyle change: leg pain  Demonstrated degree of understanding via:  Teach Back    Monitoring/Evaluation:  Dietary intake, exercise, and body weight in 4 week(s).

## 2014-12-17 ENCOUNTER — Ambulatory Visit (INDEPENDENT_AMBULATORY_CARE_PROVIDER_SITE_OTHER): Payer: BLUE CROSS/BLUE SHIELD | Admitting: Internal Medicine

## 2014-12-17 ENCOUNTER — Encounter: Payer: Self-pay | Admitting: Internal Medicine

## 2014-12-17 VITALS — BP 128/80 | HR 78 | Temp 98.2°F | Resp 20 | Ht 60.0 in | Wt 226.2 lb

## 2014-12-17 DIAGNOSIS — M5432 Sciatica, left side: Secondary | ICD-10-CM

## 2014-12-17 DIAGNOSIS — M9901 Segmental and somatic dysfunction of cervical region: Secondary | ICD-10-CM | POA: Diagnosis not present

## 2014-12-17 DIAGNOSIS — M9905 Segmental and somatic dysfunction of pelvic region: Secondary | ICD-10-CM

## 2014-12-17 DIAGNOSIS — M9906 Segmental and somatic dysfunction of lower extremity: Secondary | ICD-10-CM | POA: Diagnosis not present

## 2014-12-17 DIAGNOSIS — M79605 Pain in left leg: Secondary | ICD-10-CM

## 2014-12-17 DIAGNOSIS — M9904 Segmental and somatic dysfunction of sacral region: Secondary | ICD-10-CM

## 2014-12-17 DIAGNOSIS — M9903 Segmental and somatic dysfunction of lumbar region: Secondary | ICD-10-CM

## 2014-12-17 DIAGNOSIS — J45909 Unspecified asthma, uncomplicated: Secondary | ICD-10-CM

## 2014-12-17 NOTE — Patient Instructions (Signed)
Push fluids and rest today. May resume nml activity in the AM.  Continue current medications as ordered  Follow up with Ortho as scheduled

## 2014-12-17 NOTE — Progress Notes (Signed)
Patient ID: Mackenzie Weber, female   DOB: [birth year], 54 y.o.   MRN: [id number]    Facility  [facility]    Place of Service:   OFFICE   Allergies  Allergen Reactions  . Cortisone     Red area around injection site x 87mth  . Depo-Medrol [Methylprednisolone Acetate] Nausea Only and Other (See Comments)    Dizziness  . Aspirin Anxiety and Other (See Comments)    Rapid Heart beat    Chief Complaint  Patient presents with  . Medical Management of Chronic Issues    sciatica, LLE pain and back pain    HPI:  54 yo female seen today for f/u. She continues to have LLE pain and believes her foot is the problem. She has followed up with Ortho. She can no longer wear flat shoes without pain. She was supposed to wear orthotics but never followed up for final fit. No numbness or tingling. No loss of bowel/bladder control. No recent trauma. Going to water aerobics now. She followed up with nutritionist and has lost 3lbs since last OV  She has not seen pulmonary yet as she has not rec'd an appt. No recent asthma attacks on current inhalers  Medications: Patient's Medications  New Prescriptions   No medications on file  Previous Medications   ALBUTEROL (PROVENTIL HFA;VENTOLIN HFA) 108 (90 BASE) MCG/ACT INHALER    Inhale 2 puffs into the lungs every 6 (six) hours as needed for wheezing or shortness of breath.    CARISOPRODOL (SOMA) 350 MG TABLET    Take 350 mg by mouth 3 (three) times daily.   CETIRIZINE (ZYRTEC) 10 MG TABLET    Take 10 mg by mouth daily.   CHOLECALCIFEROL (VITAMIN D) 1000 UNITS TABLET    Take 1,000 Units by mouth daily.   EPINEPHRINE (EPIPEN JR) 0.15 MG/0.3ML INJECTION    Inject 0.15 mg into the muscle daily as needed for anaphylaxis.    ESTRADIOL (ESTRACE) 0.1 MG/GM VAGINAL CREAM    Place 5.17 Applicatorfuls vaginally daily.   FLUTICASONE-SALMETEROL (ADVAIR HFA) 115-21 MCG/ACT INHALER    Inhale into the lungs 2 (two) times daily. At 12 hour intervals   IBUPROFEN (ADVIL,MOTRIN) 800  MG TABLET    Take 800 mg by mouth every 8 (eight) hours as needed.   LOSARTAN-HYDROCHLOROTHIAZIDE (HYZAAR) 100-12.5 MG PER TABLET    Take 1 tablet by mouth daily.   MELOXICAM (MOBIC) 15 MG TABLET    Take 15 mg by mouth daily.   TIOTROPIUM (SPIRIVA HANDIHALER) 18 MCG INHALATION CAPSULE    Place 1 capsule (18 mcg total) into inhaler and inhale every morning.   TRIAMTERENE-HYDROCHLOROTHIAZIDE (MAXZIDE) 75-50 MG PER TABLET    Take 0.5 tablets by mouth daily.   Modified Medications   No medications on file  Discontinued Medications   No medications on file     Review of Systems  Constitutional: Positive for activity change (exercising more). Negative for fatigue and unexpected weight change.  Respiratory: Positive for shortness of breath (intermittent ). Negative for chest tightness and wheezing.   Cardiovascular: Negative for chest pain.  Musculoskeletal: Positive for back pain, joint swelling, arthralgias, gait problem (limping) and neck pain. Negative for myalgias.  Neurological: Negative for weakness, numbness and headaches.    Filed Vitals:   12/17/14 1307  BP: 128/80  Pulse: 78  Temp: 98.2 F (36.8 C)  TempSrc: Oral  Resp: 20  Height: 5' (1.524 m)  Weight: 226 lb 3.2 oz (102.604 kg)  SpO2: 98%  Body mass index is 44.18 kg/(m^2).  Physical Exam  Constitutional: She appears well-developed and well-nourished.  In NAD  Musculoskeletal:       Left ankle: She exhibits decreased range of motion and swelling. She exhibits no deformity and normal pulse. Tenderness. Medial malleolus tenderness found.       Feet:  (+) left standing flexion test; left pelvic inflare with SI joint restriction; no short leg; increased lumbar lordosis; gait antalgic; reduced neck rotation and sidebending; paravertebral lumbar, thoracic and cervical muscle hypertrophy with ropy tissue texture changes; strength intact; distal pulses palpable  Skin: Skin is warm and dry. No rash noted.             Assessment/Plan   ICD-9-CM ICD-10-CM   1. Asthma, chronic, unspecified asthma severity, uncomplicated- stable at this time on inhalers 493.90 J45.909 Ambulatory referral to Pulmonology  2. Sciatica of left side - improving 724.3 M54.32   3. Left leg pain - unchangeg 729.5 M79.605   4. Severe obesity (BMI >= 40) - improving with diet and exrercise 278.01 E66.01   5. Somatic dysfunction of lower extremity 739.6 M99.06   6. Somatic dysfunction of pelvic region 739.5 M99.05   7. Somatic dysfunction of lumbar region 739.3 M99.03   8. Somatic dysfunction of cervical region 739.1 M99.01   9. Somatic dysfunction of sacral region 739.4 M99.04     --Push fluids and rest today. May resume nml activity in the AM.  --Continue current medications as ordered  --Follow up with Ortho as scheduled. Continue soma and mobic for pain  --refer to pulmonary for tx options of asthma  --f/u with nutritionist. Continue healthy diet and exrecise routine  --f/u in 2 weeks for re-eval  PROCEDURE NOTE  After verbal consent obtained, OMT utilized with improvement in ROM, tissue texture, asymmetry and tenderness. Pt tolerated procedure well.   OMT Treatment 12/17/2014 12/01/2014  Neck  CS;DIR;IND/INR;ME;MFR;ST -  Neck Response I -  Lumbar IND/INR;MFR;ST;DIR LAS;MFR;ST  Lumbar Response I I  Sacrum MFR;ST MFR;ST  Sacrum Response I I  Pelvis MFR;ST;LAS ME;LAS;MFR;ST  Pelvis Response I I  Lower Ext. MFR;ST;DIR;LAS;IND/INR LAS;MFR;ST  Lower Ext. Response I I    Mackenzie Weber  [facility] [address] [place], [place] [address] [phone] Office (Wednesdays and Fridays 8 AM - 5 PM) [phone] Cell (Monday-Friday 8 AM - 5 PM)

## 2014-12-29 ENCOUNTER — Encounter: Payer: Self-pay | Admitting: Internal Medicine

## 2014-12-29 ENCOUNTER — Ambulatory Visit (INDEPENDENT_AMBULATORY_CARE_PROVIDER_SITE_OTHER): Payer: BC Managed Care – PPO | Admitting: Internal Medicine

## 2014-12-29 ENCOUNTER — Encounter (INDEPENDENT_AMBULATORY_CARE_PROVIDER_SITE_OTHER): Payer: Self-pay

## 2014-12-29 VITALS — BP 114/62 | HR 74 | Ht 60.0 in | Wt 224.6 lb

## 2014-12-29 DIAGNOSIS — J4530 Mild persistent asthma, uncomplicated: Secondary | ICD-10-CM | POA: Diagnosis not present

## 2014-12-29 MED ORDER — BUDESONIDE-FORMOTEROL FUMARATE 80-4.5 MCG/ACT IN AERO: INHALATION_SPRAY | RESPIRATORY_TRACT | Status: DC

## 2014-12-29 NOTE — Progress Notes (Signed)
Subjective:     Patient ID: Mackenzie Weber, female   DOB: 1961/04/19, 54 y.o.   MRN: [id number]  HPI  52 yobf never smoker  with asthma as child hospitalized/ers but  fine between flares and could run track but ever since then notes episodic sob/cough with colds and did fine between spells but as of June 2105 noted doe x fast walking  Up steps started spiriva 11/2014  On basis of pfts suggesting airflow obst helped some and referred 12/29/2014 to pulmonary clinic.   12/29/2014 1st Mackenzie Weber   Chief Complaint  Patient presents with  . Pulmonary Consult    Referred by Dr. Gildardo Weber for eval of Asthma. Pt states that she was told she had COPD after having PFT done approx 1 month ago. She c/o DOE with running or walking up stairs "off and on for a while". She is using albuterol 1 to 2 x per wk.   on allergy shots since 1997 from [facility] still spring / summer fall/ bad itching/ sneezing but no pred rx required  typically > just uses clariton had two asthma attacks in Oct 2015 > started  advair but did not improve exercise tol > cardiac w/u neg  finally spiriva in Feb 2015 but only using prn seems to help Wt one year prior to Mackenzie Weber all the way to 230 after stopped exercising but not trending back down.  No obvious day to day or daytime variabilty or assoc chronic cough or cp or chest tightness, subjective wheeze overt sinus or hb symptoms. No unusual exp hx or h/o childhood pna  or knowledge of premature birth.  Sleeping ok without nocturnal  or early am exacerbation  of respiratory  c/o's or need for noct saba. Also denies any obvious fluctuation of symptoms with weather or environmental changes or other aggravating or alleviating factors except as outlined above   Current Medications, Allergies, Complete Past Medical History, Past Surgical History, Family History, and Social History were reviewed in [employer] record.  ROS  The  following are not active complaints unless bolded sore throat, dysphagia, dental problems, itching, sneezing,  nasal congestion or excess/ purulent secretions, ear ache,   fever, chills, sweats, unintended wt loss, pleuritic or exertional cp, hemoptysis,  orthopnea pnd or leg swelling, presyncope, palpitations, heartburn, abdominal pain, anorexia, nausea, vomiting, diarrhea  or change in bowel or urinary habits, change in stools or urine, dysuria,hematuria,  rash, arthralgias, visual complaints, headache, numbness weakness or ataxia or problems with walking or coordination,  change in mood/affect or memory.           Review of Systems     Objective:   Physical Exam    Obese pleasant amb bf    Wt Readings from Last 3 Encounters:  12/29/14 224 lb 9.6 oz (101.878 kg)  12/17/14 226 lb 3.2 oz (102.604 kg)  12/14/14 229 lb 4.8 oz (104.01 kg)    Vital signs reviewed    HEENT: nl dentition, turbinates, and orophanx. Nl external ear canals without cough reflex   NECK :  without JVD/Nodes/TM/ nl carotid upstrokes bilaterally   LUNGS: no acc muscle use, clear to A and P bilaterally without cough on insp or exp maneuvers   CV:  RRR  no s3 or murmur or increase in P2, no edema   ABD:  soft and nontender with nl excursion in the supine position. No bruits or organomegaly, bowel sounds nl  MS:  warm without deformities, calf tenderness, cyanosis or clubbing  SKIN: warm and dry without lesions    NEURO:  alert, approp, no deficits    cxr [facility] Nov 2015 reported nl      Assessment:

## 2014-12-29 NOTE — Patient Instructions (Signed)
symbicort 80 Take 2 puffs first thing in am and then another 2 puffs about 12 hours later > fill the rx if you   Only use your albuterol as a rescue medication to be used if you can't catch your breath by resting or doing a relaxed purse lip breathing pattern.  - The less you use it, the better it will work when you need it. - Ok to use up to 2 puffs  every 4 hours if you must but call for immediate appointment if use goes up over your usual need - Don't leave home without it !!  (think of it like the spare tire for your car)   Work on inhaler technique:  relax and gently blow all the way out then take a nice smooth deep breath back in, triggering the inhaler at same time you start breathing in.  Hold for up to 5 seconds if you can.  Rinse and gargle with water when done  Please schedule a follow up office visit in 6 weeks, call sooner if needed

## 2014-12-30 ENCOUNTER — Encounter: Payer: Self-pay | Admitting: Internal Medicine

## 2014-12-30 NOTE — Assessment & Plan Note (Addendum)
pfts 11/11/14 min airflow obst only seen in mid flows (very non specific) so this is not copd and it's not even more than mild chronic asthma.   DDX of  difficult airways management all start with A and  include Adherence, Ace Inhibitors, Acid Reflux, Active Sinus Disease, Alpha 1 Antitripsin deficiency, Anxiety masquerading as Airways dz,  ABPA,  allergy(esp in young), Aspiration (esp in elderly), Adverse effects of DPI,  Active smokers, plus two Bs  = Bronchiectasis and Beta blocker use..and one C= CHF   Adherence is always the initial "prime suspect" and is a multilayered concern that requires a "trust but verify" approach in every patient - starting with knowing how to use medications, especially inhalers, correctly, keeping up with refills and understanding the fundamental difference between maintenance and prns vs those medications only taken for a very short course and then stopped and not refilled.  The proper method of use, as well as anticipated side effects, of a metered-dose inhaler are discussed and demonstrated to the patient. Improved effectiveness after extensive coaching during this visit to a level of approximately  75% from a basline of 25% so try symbicort 80 2 bid   ? Adverse effects of dpi > try off advair and spiriva.  ? Allergies/ by hx does have seasonal rhinitis but ok on allergy shots plus prn clariton and apparently does poorly on steroids so keep this to a minimum  Will try the 80 2bid dose and stop the advair and spiriva at this point and do serial f/u, keeping things simple for now to improve adherence

## 2015-01-19 ENCOUNTER — Encounter: Payer: BLUE CROSS/BLUE SHIELD | Admitting: Internal Medicine

## 2015-01-28 ENCOUNTER — Ambulatory Visit (INDEPENDENT_AMBULATORY_CARE_PROVIDER_SITE_OTHER): Payer: BC Managed Care – PPO | Admitting: Internal Medicine

## 2015-01-28 ENCOUNTER — Encounter: Payer: Self-pay | Admitting: Internal Medicine

## 2015-01-28 VITALS — BP 110/58 | HR 98 | Temp 98.8°F | Resp 20 | Ht 60.0 in | Wt 224.8 lb

## 2015-01-28 DIAGNOSIS — M9904 Segmental and somatic dysfunction of sacral region: Secondary | ICD-10-CM

## 2015-01-28 DIAGNOSIS — M5430 Sciatica, unspecified side: Secondary | ICD-10-CM | POA: Insufficient documentation

## 2015-01-28 DIAGNOSIS — M5431 Sciatica, right side: Secondary | ICD-10-CM

## 2015-01-28 DIAGNOSIS — M9906 Segmental and somatic dysfunction of lower extremity: Secondary | ICD-10-CM | POA: Diagnosis not present

## 2015-01-28 DIAGNOSIS — M9903 Segmental and somatic dysfunction of lumbar region: Secondary | ICD-10-CM

## 2015-01-28 DIAGNOSIS — M9905 Segmental and somatic dysfunction of pelvic region: Secondary | ICD-10-CM

## 2015-01-28 DIAGNOSIS — G8929 Other chronic pain: Secondary | ICD-10-CM | POA: Insufficient documentation

## 2015-01-28 NOTE — Progress Notes (Signed)
Patient ID: Mackenzie Weber, female   DOB: 1961/05/12, 54 y.o.   MRN: [id number]    Facility  [facility]    Place of Service:   OFFICE    Allergies  Allergen Reactions  . Cortisone     Red area around injection site x 67mth  . Depo-Medrol [Methylprednisolone Acetate] Nausea Only and Other (See Comments)    Dizziness  . Aspirin Anxiety and Other (See Comments)    Rapid Heart beat    Chief Complaint  Patient presents with  . Follow-up    sciatica and back pain    HPI:  54 yo female seen today for f/u. She has right hip and leg pain today. She has been receiving synvisc injections into left knee and it is feeling better. She did sprain it recently when her knee hyperextended while walking the other day. No other concerns. Denies loss of bowel/bladder control, numbness or tingling. No recent falls. Se has been applying most of her weight on RLE due to her LLE pain  She saw pulm and was told she does not have COPD. meds were adjusted and she is breathing better.  BP stable on maxzide and hyzaar.  Past Medical History  Diagnosis Date  . Hypertension   . Asthma   . Seasonal allergies   . IBS (irritable bowel syndrome)   . Sciatica of left side   . Foot pain, right    Past Surgical History  Procedure Laterality Date  . Total abdominal hysterectomy w/ bilateral salpingoophorectomy  1989    LSO-1987; RsO-1998  . Knee surgery      right  . Appendectomy    . Cholecystectomy    . Tonsillectomy    . Wrist surgery      carpal tunnel repair  . Breast surgery  2001    /biopsy-benign  . Small bowel surgery    . Excisional hemorrhoidectomy    . Bladder suspension  2012    TVT  . Abdominal hysterectomy     History   Social History  . Marital Status: Married    Spouse Name: N/A  . Number of Children: N/A  . Years of Education: N/A   Occupational History  . retired in 2015 from [employer].    Social History Main Topics  . Smoking status: Never Smoker   . Smokeless  tobacco: Never Used  . Alcohol Use: No  . Drug Use: No  . Sexual Activity:    Partners: Male    Birth Control/ Protection: Surgical   Other Topics Concern  . None   Social History Narrative     Medications: Patient's Medications  New Prescriptions   No medications on file  Previous Medications   ALBUTEROL (PROVENTIL HFA;VENTOLIN HFA) 108 (90 BASE) MCG/ACT INHALER    Inhale 2 puffs into the lungs every 6 (six) hours as needed for wheezing or shortness of breath.    BUDESONIDE-FORMOTEROL (SYMBICORT) 80-4.5 MCG/ACT INHALER    Take 2 puffs first thing in am and then another 2 puffs about 12 hours later.   CARISOPRODOL (SOMA) 350 MG TABLET    Take 350 mg by mouth 3 (three) times daily.   CHOLECALCIFEROL (VITAMIN D) 1000 UNITS TABLET    Take 1,000 Units by mouth daily.   EPINEPHRINE (EPIPEN JR) 0.15 MG/0.3ML INJECTION    Inject 0.15 mg into the muscle daily as needed for anaphylaxis.    ESTRADIOL (ESTRACE) 0.1 MG/GM VAGINAL CREAM    Place 0.25  Applicatorfuls vaginally daily.   IBUPROFEN (ADVIL,MOTRIN) 800 MG TABLET    Take 800 mg by mouth every 8 (eight) hours as needed.   LOSARTAN-HYDROCHLOROTHIAZIDE (HYZAAR) 100-12.5 MG PER TABLET    Take 1 tablet by mouth daily.   MELOXICAM (MOBIC) 15 MG TABLET    Take 15 mg by mouth daily.   MULTIPLE VITAMIN (MULTIVITAMIN) CAPSULE    Take 1 capsule by mouth daily.   TRIAMTERENE-HYDROCHLOROTHIAZIDE (MAXZIDE) 75-50 MG PER TABLET    Take 0.5 tablets by mouth daily.   Modified Medications   No medications on file  Discontinued Medications   No medications on file     Review of Systems  Constitutional: Negative for fever, chills, appetite change and fatigue.  Respiratory: Negative for cough, shortness of breath and wheezing.   Cardiovascular: Negative for chest pain.  Musculoskeletal: Positive for back pain, joint swelling, arthralgias and gait problem. Negative for neck pain.  Allergic/Immunologic: Positive for environmental allergies.    Neurological: Negative for dizziness.    Filed Vitals:   01/28/15 1317  BP: 110/58  Pulse: 98  Temp: 98.8 F (37.1 C)  TempSrc: Oral  Resp: 20  Height: 5' (1.524 m)  Weight: 224 lb 12.8 oz (101.969 kg)  SpO2: 96%   Body mass index is 43.9 kg/(m^2).  Physical Exam  Constitutional: She is oriented to person, place, and time. She appears well-developed and well-nourished. No distress.  Awake and alert in NAD  Musculoskeletal: She exhibits edema (left knee with reduced ROM and TTP anteriorly).       Left knee: She exhibits decreased range of motion and swelling. She exhibits no effusion, no deformity, no erythema and no bony tenderness. Tenderness found.  (+) right standing flexion test; right pelvic outflare with SI joint restriction an ASIS TTP; short left leg; sacral torsion; increased lumbar lordosis; reduced ROM osseous membrane between fibula and tibia; antalgic gait; +1 pitting LLE edema; distal pulses palpable; DP/PT pulses palpable  Neurological: She is alert and oriented to person, place, and time.  Skin: No rash noted.     Psychiatric: She has a normal mood and affect. Her speech is normal and behavior is normal. Judgment and thought content normal.     Labs reviewed: None   Assessment/Plan   ICD-9-CM ICD-10-CM   1. Chronic sciatica, right 724.3 M54.31   2. Severe obesity (BMI >= 40) - improving with conservative tx (diet and exercise) 278.01 E66.01   3. Somatic dysfunction of pelvic region 739.5 M99.05   4. Somatic dysfunction of lumbar region 739.3 M99.03   5. Somatic dysfunction of lower extremity 739.6 M99.06   6. Somatic dysfunction of sacral region 739.4 M99.04      --f/u with Ortho to complete Synvisc series  --Push fluids and rest today. No heavy lifting. May resume nml activity in the AM.  PROCEDURE NOTE  After verbal consent obtained, OMT utilized with improvement in ROM, tissue texture, asymmetry and tenderness. Pt tolerated procedure  well.  OMT Treatment 01/28/2015 12/17/2014 12/01/2014  Neck  - CS;DIR;IND/INR;ME;MFR;ST -  Neck Response - I -  Lumbar IND/INR;LAS;MFR;ST IND/INR;MFR;ST;DIR LAS;MFR;ST  Lumbar Response I I I  Sacrum IND/INR;MFR;ST MFR;ST MFR;ST  Sacrum Response I I I  Pelvis LAS;ME;MFR;ST;IND/INR;DIR MFR;ST;LAS ME;LAS;MFR;ST  Pelvis Response I I I  Lower Ext. LAS;MFR;ST;IND/INR;DIR MFR;ST;DIR;LAS;IND/INR LAS;MFR;ST  Lower Ext. Response I I I    Shanieka Blea S. Perlie Gold  [facility] [address] [place], [place] [address] (  [phone] Office (Wednesdays and Fridays 8 AM - 5 PM) [phone] Cell (Monday-Friday 8 AM - 5 PM)

## 2015-01-28 NOTE — Patient Instructions (Signed)
Push fluids and rest today. No heavy lifting. May resume nml activity in the AM.  Follow up in 4 weeks for re-eval OMM  Follow up with Ortho as scheduled

## 2015-02-09 ENCOUNTER — Encounter: Payer: Self-pay | Admitting: Internal Medicine

## 2015-02-09 ENCOUNTER — Ambulatory Visit (INDEPENDENT_AMBULATORY_CARE_PROVIDER_SITE_OTHER): Payer: BC Managed Care – PPO | Admitting: Internal Medicine

## 2015-02-09 ENCOUNTER — Encounter (INDEPENDENT_AMBULATORY_CARE_PROVIDER_SITE_OTHER): Payer: Self-pay

## 2015-02-09 ENCOUNTER — Telehealth: Payer: Self-pay | Admitting: Internal Medicine

## 2015-02-09 ENCOUNTER — Encounter: Payer: BLUE CROSS/BLUE SHIELD | Admitting: Internal Medicine

## 2015-02-09 VITALS — BP 104/64 | HR 79 | Temp 98.1°F | Ht 60.0 in | Wt 225.8 lb

## 2015-02-09 DIAGNOSIS — J4530 Mild persistent asthma, uncomplicated: Secondary | ICD-10-CM

## 2015-02-09 MED ORDER — ALBUTEROL SULFATE HFA 108 (90 BASE) MCG/ACT IN AERS: 2.0000 | INHALATION_SPRAY | Freq: Four times a day (QID) | RESPIRATORY_TRACT | Status: DC | PRN

## 2015-02-09 MED ORDER — BUDESONIDE-FORMOTEROL FUMARATE 80-4.5 MCG/ACT IN AERO: INHALATION_SPRAY | RESPIRATORY_TRACT | Status: DC

## 2015-02-09 NOTE — Patient Instructions (Addendum)
Continue symbicort 80 Take 2 puffs first thing in am and then another 2 puffs about 12 hours later.    Work on inhaler technique:  relax and gently blow all the way out then take a nice smooth deep breath back in, triggering the inhaler at same time you start breathing in.  Hold for up to 5 seconds if you can.  Rinse and gargle with water when done   If you are satisfied with your treatment plan,  let your doctor know and he/she can either refill your medications or you can return here when your prescription runs out.     If in any way you are not 100% satisfied,  please tell us.  If 100% better, tell your friends!  Pulmonary follow up is as needed  - call if any problem acquiring your meds due to insurance restrictions

## 2015-02-09 NOTE — Progress Notes (Signed)
Subjective:     Patient ID: Mackenzie Weber, female   DOB: 02-19-1961     MRN: [id number]    Brief patient profile:  26 yobf never smoker with asthma as child hospitalized/ers but  fine between flares and could run track but ever since then notes episodic sob/cough with colds and did fine between spells but as of June 2105 noted doe x fast walking  Up steps started spiriva 11/2014  On basis of pfts suggesting airflow obst helped some and referred 12/29/2014 to pulmonary clinic.  History of Present Illness  12/29/2014 1st [facility] office visit/ [facility]   Chief Complaint  Patient presents with  . Pulmonary Consult    Referred by Dr. Gildardo Cranker for eval of Asthma. Pt states that she was told she had COPD after having PFT done approx 1 month ago. She c/o DOE with running or walking up stairs "off and on for a while". She is using albuterol 1 to 2 x per wk.   on allergy shots since 1997 [facility] still spring / summer fall/ bad itching/ sneezing but no pred rx required  typically > just uses clariton had two asthma attacks in Oct 2015 > started  advair but did not improve exercise tol > cardiac w/u neg  finally spiriva in Feb 2015 but only using prn seems to help Wt one year prior to [place] all the way to 230 after stopped exercising but not trending back down. rec symbicort 80 Take 2 puffs first thing in am and then another 2 puffs about 12 hours later > fill the rx if you  Only use your albuterol as a rescue medication  Work on inhaler technique    02/09/2015 f/u ov/Lena Fieldhouse re: chronic asthma on allergy shots and symbicort 80 2bid Chief Complaint  Patient presents with  . Follow-up    Breathing doing well.  Doing well on Symbicort, exercising more, doing well.   working out at y ok despite sub optimal hfa   No obvious day to day or daytime variabilty or assoc chronic cough or cp or chest tightness, subjective wheeze overt sinus or hb symptoms. No unusual exp hx or h/o  childhood pna  or knowledge of premature birth.  Sleeping ok without nocturnal  or early am exacerbation  of respiratory  c/o's or need for noct saba. Also denies any obvious fluctuation of symptoms with weather or environmental changes or other aggravating or alleviating factors except as outlined above   Current Medications, Allergies, Complete Past Medical History, Past Surgical History, Family History, and Social History were reviewed in [employer] record.  ROS  The following are not active complaints unless bolded sore throat, dysphagia, dental problems, itching, sneezing,  nasal congestion or excess/ purulent secretions, ear ache,   fever, chills, sweats, unintended wt loss, pleuritic or exertional cp, hemoptysis,  orthopnea pnd or leg swelling, presyncope, palpitations, heartburn, abdominal pain, anorexia, nausea, vomiting, diarrhea  or change in bowel or urinary habits, change in stools or urine, dysuria,hematuria,  rash, arthralgias, visual complaints, headache, numbness weakness or ataxia or problems with walking or coordination,  change in mood/affect or memory.                Objective:   Physical Exam    Obese pleasant amb bf   02/09/2015        226  Wt Readings from Last 3 Encounters:  12/29/14 224 lb 9.6 oz (101.878 kg)  12/17/14  226 lb 3.2 oz (102.604 kg)  12/14/14 229 lb 4.8 oz (104.01 kg)    Vital signs reviewed    HEENT: nl dentition, turbinates, and orophanx. Nl external ear canals without cough reflex   NECK :  without JVD/Nodes/TM/ nl carotid upstrokes bilaterally   LUNGS: no acc muscle use, clear to A and P bilaterally without cough on insp or exp maneuvers   CV:  RRR  no s3 or murmur or increase in P2, no edema   ABD:  soft and nontender with nl excursion in the supine position. No bruits or organomegaly, bowel sounds nl  MS:  warm without deformities, calf tenderness, cyanosis or clubbing  SKIN: warm and dry without lesions     NEURO:  alert, approp, no deficits    [facility] Nov 2015 reported nl      Assessment:        Outpatient Encounter Prescriptions as of 02/09/2015  Medication Sig  . albuterol (PROVENTIL HFA;VENTOLIN HFA) 108 (90 BASE) MCG/ACT inhaler Inhale 2 puffs into the lungs every 6 (six) hours as needed for wheezing or shortness of breath.  . budesonide-formoterol (SYMBICORT) 80-4.5 MCG/ACT inhaler Take 2 puffs first thing in am and then another 2 puffs about 12 hours later.  . carisoprodol (SOMA) 350 MG tablet Take 350 mg by mouth 3 (three) times daily.  . cholecalciferol (VITAMIN D) 1000 UNITS tablet Take 1,000 Units by mouth daily.  Marland Kitchen EPINEPHrine (EPIPEN JR) 0.15 MG/0.3ML injection Inject 0.15 mg into the muscle daily as needed for anaphylaxis.   Marland Kitchen estradiol (ESTRACE) 0.1 MG/GM vaginal cream Place 6.06 Applicatorfuls vaginally daily.  Marland Kitchen ibuprofen (ADVIL,MOTRIN) 800 MG tablet Take 800 mg by mouth every 8 (eight) hours as needed.  Marland Kitchen losartan-hydrochlorothiazide (HYZAAR) 100-12.5 MG per tablet Take 1 tablet by mouth daily.  . meloxicam (MOBIC) 15 MG tablet Take 15 mg by mouth daily.  . Multiple Vitamin (MULTIVITAMIN) capsule Take 1 capsule by mouth daily.  Marland Kitchen triamterene-hydrochlorothiazide (MAXZIDE) 75-50 MG per tablet Take 0.5 tablets by mouth daily.   . [DISCONTINUED] albuterol (PROVENTIL HFA;VENTOLIN HFA) 108 (90 BASE) MCG/ACT inhaler Inhale 2 puffs into the lungs every 6 (six) hours as needed for wheezing or shortness of breath.   . [DISCONTINUED] budesonide-formoterol (SYMBICORT) 80-4.5 MCG/ACT inhaler Take 2 puffs first thing in am and then another 2 puffs about 12 hours later.

## 2015-02-09 NOTE — Telephone Encounter (Signed)
Called and spoke to pt. Pt stated she was unable to get the $25 program for Symbicort. Called pharmacy and spoke to [facility] and was advised they have a different insurance on file. Insurance information faxed to pharmacy. Called Anik back at the pharmacy and was advised to call back in an hour d/t the pharmacy being backed up. Will call back in morning.

## 2015-02-10 ENCOUNTER — Encounter: Payer: Self-pay | Admitting: Internal Medicine

## 2015-02-10 NOTE — Telephone Encounter (Signed)
Called [facility] - Copay is $0 for Symbicort Pt already aware. Nothing further needed.

## 2015-02-10 NOTE — Assessment & Plan Note (Signed)
pfts 11/11/14 min airflow obst only seen in mid flows (very non specific) - 02/09/2015 p extensive coaching HFA effectiveness =   75%   Despite suboptimal hfa. All goals of chronic asthma control met including optimal function and elimination of symptoms with minimal need for rescue therapy.  Contingencies discussed in full including contacting this office immediately if not controlling the symptoms using the rule of two's.     Pulmonary f/u can therefore be prn

## 2015-02-15 ENCOUNTER — Ambulatory Visit: Payer: BC Managed Care – PPO | Admitting: Dietician

## 2015-02-24 ENCOUNTER — Encounter: Payer: BC Managed Care – PPO | Attending: Internal Medicine | Admitting: Dietician

## 2015-02-24 DIAGNOSIS — Z6841 Body Mass Index (BMI) 40.0 and over, adult: Secondary | ICD-10-CM | POA: Diagnosis not present

## 2015-02-24 DIAGNOSIS — Z713 Dietary counseling and surveillance: Secondary | ICD-10-CM | POA: Diagnosis not present

## 2015-02-24 NOTE — Progress Notes (Signed)
  Medical Nutrition Therapy:  Appt start time: 320 end time: 345   Follow up:  Primary concerns today: Mackenzie Weber returns having lost 5 pounds in the last 2 months. She has been traveling a lot recently and having water retention. Also hasn't been able to do any water aerobics as the pool has been out of order at her gym. She feels like she needs to get back into her exercise routine. She is frustrated with the water retention she is experiencing. We discussed drinking more water and less soda and practicing low sodium flavoring and cooking methods.  Plan: continue with current goals  Preferred Learning Style:   No preference indicated   Learning Readiness:   Ready  MEDICATIONS: see list   DIETARY INTAKE:  Usual eating pattern includes 2 meals and 1 snacks per day.  Avoided foods include liver, oysters, scallops, chitterlings.  Lactose intolerant but can tolerate cheese.    24-hr recall:  B ( AM): Atkins shake OR smoothie from [employer] Snk ( AM): none  L ( PM): salmon, green beans, and mashed potatoes Snk ( PM):  D ( PM): cooks 3 days a week; crockpot chicken, rice, cabbage or green beans OR pork ribs OR spaghetti or lasagna with water or sweet tea mixed with lemonade Snk ( PM): starburst or skittles or grapes or rice krispy treats or fruit snacks  Beverages: water, sweet tea mixed with lemonade, Dr. Malachi Bonds  Usual physical activity: water aerobic or gym or workout videos (3x a week)  Estimated energy needs: 1500-1700 calories  Progress Towards Goal(s):  In progress.   Nutritional Diagnosis:  [place]-3.3 Overweight/obesity As related to erratic meal pattern and excessive carbohydrate intake.  As evidenced by BMI 45.    Intervention:  Nutrition counseling provided.  Teaching Method Utilized:  Visual Auditory  Handouts given during visit include: Low Sodium Flavoring Tips  Barriers to learning/adherence to lifestyle change: leg pain  Demonstrated degree of understanding  via:  Teach Back   Monitoring/Evaluation:  Dietary intake, exercise, and body weight in 2 month(s).

## 2015-02-24 NOTE — Patient Instructions (Addendum)
-  Start eating breakfast with protein  -Frozen waffle with low sodium [place] bacon   -Try having a low carb protein shake for breakfast ([employer] or Atkins shake)  -[facility] Protein bar  -Have a protein food with every meal and snack (cheese, eggs, meat, nuts, peanut butter)  -Avoid fried food  -Choose baked, broiled, grilled, steamed, or sauteed  -Be as active as possible  -Avoid sweetened drinks -Drink mostly water!  -Pre portion snacks  -Fill up on nonstarchy vegetables  -Talk to doctor about water retention

## 2015-02-25 ENCOUNTER — Encounter: Payer: Self-pay | Admitting: Internal Medicine

## 2015-02-25 ENCOUNTER — Ambulatory Visit (INDEPENDENT_AMBULATORY_CARE_PROVIDER_SITE_OTHER): Payer: BC Managed Care – PPO | Admitting: Internal Medicine

## 2015-02-25 VITALS — BP 110/68 | HR 75 | Temp 98.1°F | Resp 18 | Ht 60.0 in | Wt 222.8 lb

## 2015-02-25 DIAGNOSIS — M5432 Sciatica, left side: Secondary | ICD-10-CM

## 2015-02-25 DIAGNOSIS — M9905 Segmental and somatic dysfunction of pelvic region: Secondary | ICD-10-CM | POA: Diagnosis not present

## 2015-02-25 DIAGNOSIS — M7660 Achilles tendinitis, unspecified leg: Secondary | ICD-10-CM

## 2015-02-25 DIAGNOSIS — M9904 Segmental and somatic dysfunction of sacral region: Secondary | ICD-10-CM

## 2015-02-25 DIAGNOSIS — M9906 Segmental and somatic dysfunction of lower extremity: Secondary | ICD-10-CM

## 2015-02-25 DIAGNOSIS — M79605 Pain in left leg: Secondary | ICD-10-CM | POA: Diagnosis not present

## 2015-02-25 DIAGNOSIS — M67879 Other specified disorders of synovium and tendon, unspecified ankle and foot: Secondary | ICD-10-CM

## 2015-02-25 DIAGNOSIS — M9903 Segmental and somatic dysfunction of lumbar region: Secondary | ICD-10-CM | POA: Diagnosis not present

## 2015-02-25 DIAGNOSIS — M9901 Segmental and somatic dysfunction of cervical region: Secondary | ICD-10-CM

## 2015-02-25 NOTE — Patient Instructions (Signed)
Push fluids and rest today. No heavy lifting. May resume nml activity in the AM.   Follow up in 4 weeks for re-eval

## 2015-02-25 NOTE — Progress Notes (Signed)
Patient ID: Mackenzie Weber, female   DOB: [birth year], 54 y.o.   MRN: [id number]    Location:    [facility]   Place of Service:   OFFICE    Chief Complaint  Patient presents with  . Follow-up    OMM    HPI:  54 yo female seen today for f/u back pain. She is c/a a cyst on back of right heel x 1 month. Area is painful. Lower back pain is 2/10 on scale. She resumed workout routine without a problem. Left knee is a little sore today after climbing a ladder. She as completed synvisc series 2 weeks ago. No loss of bowel/bladder control. No tingling but has numbness in right middle finger. It was examined by ortho and injection recommended but she declined.  Asthma has been stable. She saw pulm since last OV and is being educated about inhaler use. Weight reduction encouraged. Occasional DOE.  Her sister was dx with breast cancer and underwent sx this week  Past Medical History  Diagnosis Date  . Hypertension   . Asthma   . Seasonal allergies   . IBS (irritable bowel syndrome)   . Sciatica of left side   . Foot pain, right     Past Surgical History  Procedure Laterality Date  . Total abdominal hysterectomy w/ bilateral salpingoophorectomy  1989    LSO-1987; RsO-1998  . Knee surgery      right  . Appendectomy    . Cholecystectomy    . Tonsillectomy    . Wrist surgery      carpal tunnel repair  . Breast surgery  2001    /biopsy-benign  . Small bowel surgery    . Excisional hemorrhoidectomy    . Bladder suspension  2012    TVT  . Abdominal hysterectomy      Patient Care Team: Gildardo Cranker, MD as PCP - General  History   Social History  . Marital Status: Married    Spouse Name: N/A  . Number of Children: N/A  . Years of Education: N/A   Occupational History  . retired in 2015 from [employer].    Social History Main Topics  . Smoking status: Never Smoker   . Smokeless tobacco: Never Used  . Alcohol Use: No  . Drug Use: No  . Sexual Activity:    Partners: Male     Birth Control/ Protection: Surgical   Other Topics Concern  . Not on file   Social History Narrative     reports that she has never smoked. She has never used smokeless tobacco. She reports that she does not drink alcohol or use illicit drugs.  Allergies  Allergen Reactions  . Cortisone     Red area around injection site x 65mth  . Depo-Medrol [Methylprednisolone Acetate] Nausea Only and Other (See Comments)    Dizziness  . Aspirin Anxiety and Other (See Comments)    Rapid Heart beat    Medications: Patient's Medications  New Prescriptions   No medications on file  Previous Medications   ALBUTEROL (PROVENTIL HFA;VENTOLIN HFA) 108 (90 BASE) MCG/ACT INHALER    Inhale 2 puffs into the lungs every 6 (six) hours as needed for wheezing or shortness of breath.   BUDESONIDE-FORMOTEROL (SYMBICORT) 80-4.5 MCG/ACT INHALER    Take 2 puffs first thing in am and then another 2 puffs about 12 hours later.   CARISOPRODOL (SOMA) 350 MG TABLET    Take 350 mg by mouth  3 (three) times daily.   EPINEPHRINE (EPIPEN JR) 0.15 MG/0.3ML INJECTION    Inject 0.15 mg into the muscle daily as needed for anaphylaxis.    ESTRADIOL (ESTRACE) 0.1 MG/GM VAGINAL CREAM    Place 4.81 Applicatorfuls vaginally daily.   LOSARTAN-HYDROCHLOROTHIAZIDE (HYZAAR) 100-12.5 MG PER TABLET    Take 1 tablet by mouth daily.   MELOXICAM (MOBIC) 15 MG TABLET    Take 15 mg by mouth daily.   MULTIPLE VITAMIN (MULTIVITAMIN) CAPSULE    Take 1 capsule by mouth daily.   TRIAMTERENE-HYDROCHLOROTHIAZIDE (MAXZIDE) 75-50 MG PER TABLET    Take 0.5 tablets by mouth daily.   Modified Medications   No medications on file  Discontinued Medications   CHOLECALCIFEROL (VITAMIN D) 1000 UNITS TABLET    Take 1,000 Units by mouth daily.   IBUPROFEN (ADVIL,MOTRIN) 800 MG TABLET    Take 800 mg by mouth every 8 (eight) hours as needed.    Review of Systems  Constitutional: Negative for activity change.  Musculoskeletal: Positive for back pain and  arthralgias.  Neurological: Negative for numbness.  All other systems reviewed and are negative.   Filed Vitals:   02/25/15 1258  BP: 110/68  Pulse: 75  Temp: 98.1 F (36.7 C)  TempSrc: Oral  Resp: 18  Height: 5' (1.524 m)  Weight: 222 lb 12.8 oz (101.061 kg)  SpO2: 98%   Body mass index is 43.51 kg/(m^2).  Physical Exam  Constitutional: She is oriented to person, place, and time. She appears well-developed and well-nourished. No distress.  Cardiovascular:  +1 pitting L>RLE edema. No calf TTP. Distal pulses intact  Musculoskeletal: She exhibits edema and tenderness.       Left knee: She exhibits decreased range of motion and swelling. She exhibits no effusion, no ecchymosis, no deformity, no erythema, normal patellar mobility, no bony tenderness and normal meniscus. Tenderness found.  (+) left standing flexion test; left pelvic inflare with SI joint restriction and ASIS TTP; right short leg; (+) sacral torsion; right achilles tendon distal TTP cyst vs calcium deposit but tendon intact; increased lumbar lordosis; OA extended; reduced L>R neck rotation and sidebending; paravertebral lumbar, thoracic and cervical muscle hypertrophy with ropy tissue texture changes; strength intact  Neurological: She is alert and oriented to person, place, and time.  Skin: Skin is warm and dry. No rash noted.     Psychiatric: She has a normal mood and affect. Her behavior is normal. Judgment and thought content normal.     Labs reviewed:   No results found.   Assessment/Plan   ICD-9-CM ICD-10-CM   1. Sciatica of left side - cont meds 724.3 M54.32   2. Left leg pain - due to #1 729.5 M79.605   3. Somatic dysfunction of lumbar region 739.3 M99.03   4. Somatic dysfunction of lower extremity 739.6 M99.06   5. Somatic dysfunction of pelvic region 739.5 M99.05   6. Somatic dysfunction of cervical region 739.1 M99.01   7. Somatic dysfunction of sacral region 739.4 M99.04   8.      Achilles  tendon pain on right - cyst vs calcium deposit-  Cont observation   --Push fluids and rest today. No heavy lifting. May resume nml activity in the AM.  --F/u in 4 weeks for re-eval. Continue current medications as ordered  PROCEDURE NOTE  After verbal consent obtained, OMT utilized with improvement in ROM, tissue texture, asymmetry and tenderness. Pt tolerated procedure well.   OMT Treatment 02/25/2015 01/28/2015 12/17/2014 12/01/2014  Neck  CS;DIR;IND/INR;ME;MFR;ST -  CS;DIR;IND/INR;ME;MFR;ST -  Neck Response I - I -  Lumbar IND/INR;LAS;MFR;ST IND/INR;LAS;MFR;ST IND/INR;MFR;ST;DIR LAS;MFR;ST  Lumbar Response I I I I  Sacrum IND/INR;MFR;ST IND/INR;MFR;ST MFR;ST MFR;ST  Sacrum Response I I I I  Pelvis LAS;ME;MFR;ST;IND/INR;DIR LAS;ME;MFR;ST;IND/INR;DIR MFR;ST;LAS ME;LAS;MFR;ST  Pelvis Response I I I I  Lower Ext. LAS;MFR;ST;IND/INR;DIR LAS;MFR;ST;IND/INR;DIR MFR;ST;DIR;LAS;IND/INR LAS;MFR;ST  Lower Ext. Response I I I I    Jasmina Gendron S. Perlie Gold  [facility] [address] [place], [place] [address] [phone] Cell (Monday-Friday 8 AM - 5 PM) [phone] After 5 PM and follow prompts

## 2015-03-02 ENCOUNTER — Ambulatory Visit: Payer: BC Managed Care – PPO | Admitting: Internal Medicine

## 2015-03-04 ENCOUNTER — Emergency Department (HOSPITAL_BASED_OUTPATIENT_CLINIC_OR_DEPARTMENT_OTHER): Payer: BC Managed Care – PPO

## 2015-03-04 ENCOUNTER — Emergency Department (HOSPITAL_BASED_OUTPATIENT_CLINIC_OR_DEPARTMENT_OTHER)
Admission: EM | Admit: 2015-03-04 | Discharge: 2015-03-04 | Disposition: A | Discharge status: Home / Self Care | Payer: BC Managed Care – PPO | Attending: Emergency Medicine | Admitting: Emergency Medicine

## 2015-03-04 ENCOUNTER — Encounter (HOSPITAL_BASED_OUTPATIENT_CLINIC_OR_DEPARTMENT_OTHER): Payer: Self-pay | Admitting: Emergency Medicine

## 2015-03-04 DIAGNOSIS — Z90710 Acquired absence of both cervix and uterus: Secondary | ICD-10-CM | POA: Insufficient documentation

## 2015-03-04 DIAGNOSIS — Z9889 Other specified postprocedural states: Secondary | ICD-10-CM | POA: Diagnosis not present

## 2015-03-04 DIAGNOSIS — Z9049 Acquired absence of other specified parts of digestive tract: Secondary | ICD-10-CM | POA: Insufficient documentation

## 2015-03-04 DIAGNOSIS — I10 Essential (primary) hypertension: Secondary | ICD-10-CM | POA: Insufficient documentation

## 2015-03-04 DIAGNOSIS — Z791 Long term (current) use of non-steroidal anti-inflammatories (NSAID): Secondary | ICD-10-CM | POA: Diagnosis not present

## 2015-03-04 DIAGNOSIS — R143 Flatulence: Secondary | ICD-10-CM | POA: Insufficient documentation

## 2015-03-04 DIAGNOSIS — R197 Diarrhea, unspecified: Secondary | ICD-10-CM | POA: Insufficient documentation

## 2015-03-04 DIAGNOSIS — Z7951 Long term (current) use of inhaled steroids: Secondary | ICD-10-CM | POA: Insufficient documentation

## 2015-03-04 DIAGNOSIS — Z79899 Other long term (current) drug therapy: Secondary | ICD-10-CM | POA: Diagnosis not present

## 2015-03-04 DIAGNOSIS — J45909 Unspecified asthma, uncomplicated: Secondary | ICD-10-CM | POA: Insufficient documentation

## 2015-03-04 DIAGNOSIS — Z8719 Personal history of other diseases of the digestive system: Secondary | ICD-10-CM | POA: Insufficient documentation

## 2015-03-04 DIAGNOSIS — R1013 Epigastric pain: Secondary | ICD-10-CM | POA: Insufficient documentation

## 2015-03-04 DIAGNOSIS — R112 Nausea with vomiting, unspecified: Secondary | ICD-10-CM | POA: Diagnosis present

## 2015-03-04 DIAGNOSIS — Z8739 Personal history of other diseases of the musculoskeletal system and connective tissue: Secondary | ICD-10-CM | POA: Insufficient documentation

## 2015-03-04 DIAGNOSIS — R630 Anorexia: Secondary | ICD-10-CM | POA: Diagnosis not present

## 2015-03-04 DIAGNOSIS — R141 Gas pain: Secondary | ICD-10-CM | POA: Insufficient documentation

## 2015-03-04 DIAGNOSIS — R142 Eructation: Secondary | ICD-10-CM

## 2015-03-04 MED ORDER — SIMETHICONE 40 MG/0.6ML PO SUSP (UNIT DOSE)
120.0000 mg | Freq: Once | ORAL | Status: AC
Start: 2015-03-04 — End: 2015-03-04
  Administered 2015-03-04: 120 mg via ORAL
  Filled 2015-03-04: qty 1.8

## 2015-03-04 MED ORDER — SODIUM CHLORIDE 0.9 % IV BOLUS (SEPSIS)
1000.0000 mL | Freq: Once | INTRAVENOUS | Status: AC
  Administered 2015-03-04: 1000 mL via INTRAVENOUS

## 2015-03-04 MED ORDER — ONDANSETRON HCL 4 MG/2ML IJ SOLN
4.0000 mg | Freq: Once | INTRAMUSCULAR | Status: AC
  Administered 2015-03-04: 4 mg via INTRAVENOUS
  Filled 2015-03-04: qty 2

## 2015-03-04 MED ORDER — ONDANSETRON 8 MG PO TBDP: 8.0000 mg | ORAL_TABLET | Freq: Three times a day (TID) | ORAL | Status: DC | PRN

## 2015-03-04 MED ORDER — SIMETHICONE 80 MG PO CHEW: 80.0000 mg | CHEWABLE_TABLET | Freq: Four times a day (QID) | ORAL | Status: DC | PRN

## 2015-03-04 NOTE — Discharge Instructions (Signed)
Bloating Bloating is the feeling of fullness in your belly. You may feel as though your pants are too tight. Often the cause of bloating is overeating, retaining fluids, or having gas in your bowel. It is also caused by swallowing air and eating foods that cause gas. Irritable bowel syndrome is one of the most common causes of bloating. Constipation is also a common cause. Sometimes more serious problems can cause bloating. SYMPTOMS  Usually there is a feeling of fullness, as though your abdomen is bulged out. There may be mild discomfort.  DIAGNOSIS  Usually no particular testing is necessary for most bloating. If the condition persists and seems to become worse, your caregiver may do additional testing.  TREATMENT   There is no direct treatment for bloating.  Do not put gas into the bowel. Avoid chewing gum and sucking on candy. These tend to make you swallow air. Swallowing air can also be a nervous habit. Try to avoid this.  Avoiding high residue diets will help. Eat foods with soluble fibers (examples include root vegetables, apples, or barley) and substitute dairy products with soy and rice products. This helps irritable bowel syndrome.  If constipation is the cause, then a high residue diet with more fiber will help.  Avoid carbonated beverages.  Over-the-counter preparations are available that help reduce gas. Your pharmacist can help you with this. SEEK MEDICAL CARE IF:   Bloating continues and seems to be getting worse.  You notice a weight gain.  You have a weight loss but the bloating is getting worse.  You have changes in your bowel habits or develop nausea or vomiting. SEEK IMMEDIATE MEDICAL CARE IF:   You develop shortness of breath or swelling in your legs.  You have an increase in abdominal pain or develop chest pain. Document Released: 08/01/2006 Document Revised: 12/24/2011 Document Reviewed: 09/19/2007 ExitCare Patient Information 2015 [employer]. This  information is not intended to replace advice given to you by your health care provider. Make sure you discuss any questions you have with your health care provider.  

## 2015-03-04 NOTE — ED Notes (Signed)
Patient transported to X-ray 

## 2015-03-04 NOTE — ED Notes (Addendum)
Patient reports vomiting and diarrhea x 2 days.  Reports that this has since resolved but began having gas.  Patient has excessive belching during examination by EDP.  Reports abdominal tenderness.

## 2015-03-04 NOTE — ED Notes (Signed)
Vomiting and diarrhea x 2 days ago   Stopped yesterday  Now has a lot of GAS

## 2015-03-04 NOTE — ED Notes (Signed)
MD at bedside. 

## 2015-03-04 NOTE — ED Notes (Signed)
Pt reports that she is feeling much better, updated on poc and awaiting results, no other needs at this time

## 2015-03-04 NOTE — ED Provider Notes (Signed)
CSN: [id number]     Arrival date & time 03/04/15  0019 History   First MD Initiated Contact with Patient 03/04/15 0037     Chief Complaint  Patient presents with  . GI Problem     (Consider location/radiation/quality/duration/timing/severity/associated sxs/prior Treatment) HPI  This is a 54 year old female who developed nausea, vomiting and diarrhea 3 days ago. She attributes this to eating sausage and waffle syrup breakfast. She usually eats fruit smoothies for breakfast. Vomiting and diarrhea have subsided but she continues to be nauseated and feels very gassy. She is having frequent belching and frequent lower GI flatulence. She is having epigastric discomfort and cramping which she rates as a 4 out of 10. She has been taking Pepto-Bismol and a PPI without relief. Her appetite has been poor but she has been able to keep fluids down. She feels like her abdomen is bloated.  Past Medical History  Diagnosis Date  . Hypertension   . Asthma   . Seasonal allergies   . IBS (irritable bowel syndrome)   . Sciatica of left side   . Foot pain, right    Past Surgical History  Procedure Laterality Date  . Total abdominal hysterectomy w/ bilateral salpingoophorectomy  1989    LSO-1987; RsO-1998  . Knee surgery      right  . Appendectomy    . Cholecystectomy    . Tonsillectomy    . Wrist surgery      carpal tunnel repair  . Breast surgery  2001    /biopsy-benign  . Small bowel surgery    . Excisional hemorrhoidectomy    . Bladder suspension  2012    TVT  . Abdominal hysterectomy     Family History  Problem Relation Age of Onset  . Diabetes Father   . Hypertension Father   . Diabetes Mother   . Hypertension Mother   . Asthma Mother   . Diabetes Brother   . Hypertension Sister   . Diabetes Sister   . Cancer Sister 35    breast  . Hypertension Paternal Uncle    History  Substance Use Topics  . Smoking status: Never Smoker   . Smokeless tobacco: Never Used  . Alcohol Use: No    OB History    Gravida Para Term Preterm AB TAB SAB Ectopic Multiple Living   2 2             Review of Systems  All other systems reviewed and are negative.   Allergies  Cortisone; Depo-medrol; and Aspirin  Home Medications   Prior to Admission medications   Medication Sig Start Date End Date Taking? Authorizing Provider  albuterol (PROVENTIL HFA;VENTOLIN HFA) 108 (90 BASE) MCG/ACT inhaler Inhale 2 puffs into the lungs every 6 (six) hours as needed for wheezing or shortness of breath. 02/09/15   Tanda Rockers, MD  budesonide-formoterol [facility]) 80-4.5 MCG/ACT inhaler Take 2 puffs first thing in am and then another 2 puffs about 12 hours later. 02/09/15   Tanda Rockers, MD  EPINEPHrine (EPIPEN JR) 0.15 MG/0.3ML injection Inject 0.15 mg into the muscle daily as needed for anaphylaxis.     Historical Provider, MD  estradiol (ESTRACE) 0.1 MG/GM vaginal cream Place 7.40 Applicatorfuls vaginally daily. 11/03/12   Earnstine Regal, PA-C  losartan-hydrochlorothiazide (HYZAAR) 100-12.5 MG per tablet Take 1 tablet by mouth daily. 09/04/14   Historical Provider, MD  meloxicam (MOBIC) 15 MG tablet Take 15 mg by mouth daily. 12/14/14   Historical Provider, MD  Multiple  Vitamin (MULTIVITAMIN) capsule Take 1 capsule by mouth daily.    Historical Provider, MD  triamterene-hydrochlorothiazide (MAXZIDE) 75-50 MG per tablet Take 0.5 tablets by mouth daily.     Historical Provider, MD   BP 113/62 mmHg  Pulse 67  Temp(Src) 98.2 F (36.8 C) (Oral)  Resp 20  Ht 5' (1.524 m)  Wt 222 lb (100.699 kg)  BMI 43.36 kg/m2  SpO2 100%   Physical Exam  General: Well-developed, well-nourished female in no acute distress; appearance consistent with age of record HENT: normocephalic; atraumatic Eyes: pupils equal, round and reactive to light; extraocular muscles intact; arcus senilis bilaterally Neck: supple Heart: regular rate and rhythm Lungs: clear to auscultation bilaterally Abdomen: soft; nondistended;  mild epigastric tenderness; no masses or hepatosplenomegaly; bowel sounds present; frequent eructation Extremities: No deformity; full range of motion; pulses normal Neurologic: Awake, alert and oriented; motor function intact in all extremities and symmetric; no facial droop Skin: Warm and dry Psychiatric: Normal mood and affect    ED Course  Procedures (including critical care time)   MDM  Nursing notes and vitals signs, including pulse oximetry, reviewed.  Summary of this visit's results, reviewed by myself:  Imaging Studies: Dg Abd 1 View  03/04/2015   CLINICAL DATA:  Acute onset of epigastric abdominal pain, vomiting and diarrhea. Excessive belching. Initial encounter.  EXAM: ABDOMEN - 1 VIEW  COMPARISON:  CT of the abdomen and pelvis performed 07/29/2013  FINDINGS: The visualized bowel gas pattern is unremarkable. Scattered air and stool filled loops of colon are seen; no abnormal dilatation of small bowel loops is seen to suggest small bowel obstruction. No free intra-abdominal air is identified, though evaluation for free air is limited on a single supine view.  The visualized osseous structures are within normal limits; the sacroiliac joints are unremarkable in appearance. Clips are noted within the right upper quadrant, reflecting prior cholecystectomy.  IMPRESSION: Unremarkable bowel gas pattern; no free intra-abdominal air seen. Moderate amount of stool noted in the colon.   Electronically Signed   By: Garald Balding M.D.   On: 03/04/2015 01:56   2:05 AM Patient feeling better after IV Zofran and simethicone by mouth. She was shown her x-ray and advised that she actually has a paucity of gas in her colon. She may benefit from a laxative such as MiraLAX or milk of magnesia. She was advised to continue over-the-counter simethicone and to discontinue Pepto-Bismol as it can be constipating. We will provide a Zofran prescription for her nausea.   Shanon Rosser, MD 03/04/15 [phone]

## 2015-03-23 ENCOUNTER — Encounter: Payer: Self-pay | Admitting: Internal Medicine

## 2015-03-23 ENCOUNTER — Ambulatory Visit (INDEPENDENT_AMBULATORY_CARE_PROVIDER_SITE_OTHER): Payer: BC Managed Care – PPO | Admitting: Internal Medicine

## 2015-03-23 VITALS — BP 110/78 | HR 68 | Temp 97.7°F | Resp 18 | Ht 60.0 in | Wt 224.6 lb

## 2015-03-23 DIAGNOSIS — M5431 Sciatica, right side: Secondary | ICD-10-CM

## 2015-03-23 DIAGNOSIS — M9903 Segmental and somatic dysfunction of lumbar region: Secondary | ICD-10-CM

## 2015-03-23 DIAGNOSIS — M9905 Segmental and somatic dysfunction of pelvic region: Secondary | ICD-10-CM

## 2015-03-23 DIAGNOSIS — S93401A Sprain of unspecified ligament of right ankle, initial encounter: Secondary | ICD-10-CM | POA: Diagnosis not present

## 2015-03-23 DIAGNOSIS — M9904 Segmental and somatic dysfunction of sacral region: Secondary | ICD-10-CM | POA: Diagnosis not present

## 2015-03-23 DIAGNOSIS — M9901 Segmental and somatic dysfunction of cervical region: Secondary | ICD-10-CM | POA: Diagnosis not present

## 2015-03-23 DIAGNOSIS — M9906 Segmental and somatic dysfunction of lower extremity: Secondary | ICD-10-CM

## 2015-03-23 NOTE — Progress Notes (Signed)
Patient ID: Mackenzie Weber, female   DOB: 1961-05-28, 54 y.o.   MRN: [id number]    Location:    [facility]   Place of Service:   OFFICE  Chief Complaint  Patient presents with  . Follow-up    right ankle sprain    HPI:  54 yo female seen today for f/u. She sprain her right ankle 2 weeks ago when she stepped on a rock at [place] while wearing wedge heels. She was seen by Ortho and had to wear a boot. Now she is in an ankle brace. Hip pain increased due to recent tripping incident. No numbness or tingling. No loss of bowel/bladder control  BP stable on meds  She followed through with colonic cleanser  Past Medical History  Diagnosis Date  . Hypertension   . Asthma   . Seasonal allergies   . IBS (irritable bowel syndrome)   . Sciatica of left side   . Foot pain, right     Past Surgical History  Procedure Laterality Date  . Total abdominal hysterectomy w/ bilateral salpingoophorectomy  1989    LSO-1987; RsO-1998  . Knee surgery      right  . Appendectomy    . Cholecystectomy    . Tonsillectomy    . Wrist surgery      carpal tunnel repair  . Breast surgery  2001    /biopsy-benign  . Small bowel surgery    . Excisional hemorrhoidectomy    . Bladder suspension  2012    TVT  . Abdominal hysterectomy      Patient Care Team: Gildardo Cranker, MD as PCP - General  History   Social History  . Marital Status: Married    Spouse Name: N/A  . Number of Children: N/A  . Years of Education: N/A   Occupational History  . retired in 2015 from [employer].    Social History Main Topics  . Smoking status: Never Smoker   . Smokeless tobacco: Never Used  . Alcohol Use: No  . Drug Use: No  . Sexual Activity:    Partners: Male    Birth Control/ Protection: Surgical   Other Topics Concern  . Not on file   Social History Narrative     reports that she has never smoked. She has never used smokeless tobacco. She reports that she does not drink alcohol or use illicit  drugs.  Allergies  Allergen Reactions  . Cortisone     Red area around injection site x 15mth  . Depo-Medrol [Methylprednisolone Acetate] Nausea Only and Other (See Comments)    Dizziness  . Aspirin Anxiety and Other (See Comments)    Rapid Heart beat    Medications: Patient's Medications  New Prescriptions   No medications on file  Previous Medications   ALBUTEROL (PROVENTIL HFA;VENTOLIN HFA) 108 (90 BASE) MCG/ACT INHALER    Inhale 2 puffs into the lungs every 6 (six) hours as needed for wheezing or shortness of breath.   BUDESONIDE-FORMOTEROL (SYMBICORT) 80-4.5 MCG/ACT INHALER    Take 2 puffs first thing in am and then another 2 puffs about 12 hours later.   EPINEPHRINE (EPIPEN JR) 0.15 MG/0.3ML INJECTION    Inject 0.15 mg into the muscle daily as needed for anaphylaxis.    ESTRADIOL (ESTRACE) 0.1 MG/GM VAGINAL CREAM    Place 3.76 Applicatorfuls vaginally daily.   LOSARTAN-HYDROCHLOROTHIAZIDE (HYZAAR) 100-12.5 MG PER TABLET    Take 1 tablet by mouth daily.   MELOXICAM (MOBIC)  15 MG TABLET    Take 15 mg by mouth daily.   MULTIPLE VITAMIN (MULTIVITAMIN) CAPSULE    Take 1 capsule by mouth daily.   SIMETHICONE (GAS-X) 80 MG CHEWABLE TABLET    Chew 1 tablet (80 mg total) by mouth every 6 (six) hours as needed for flatulence.   TRIAMTERENE-HYDROCHLOROTHIAZIDE (MAXZIDE) 75-50 MG PER TABLET    Take 0.5 tablets by mouth daily.   Modified Medications   No medications on file  Discontinued Medications   ONDANSETRON (ZOFRAN ODT) 8 MG DISINTEGRATING TABLET    Take 1 tablet (8 mg total) by mouth every 8 (eight) hours as needed for nausea or vomiting.    Review of Systems  Constitutional: Positive for fatigue. Negative for fever, chills and appetite change.  Respiratory: Negative for chest tightness, shortness of breath and wheezing.   Cardiovascular: Positive for leg swelling. Negative for chest pain and palpitations.  Gastrointestinal: Negative for abdominal pain, diarrhea and constipation.   Musculoskeletal: Positive for back pain, joint swelling, arthralgias, gait problem and neck pain.  Neurological: Negative for dizziness, weakness and headaches.    Filed Vitals:   03/23/15 1317  BP: 110/78  Pulse: 68  Temp: 97.7 F (36.5 C)  TempSrc: Oral  Resp: 18  Height: 5' (1.524 m)  Weight: 224 lb 9.6 oz (101.878 kg)  SpO2: 97%   Body mass index is 43.86 kg/(m^2).  Physical Exam  Constitutional: She is oriented to person, place, and time. She appears well-developed and well-nourished. No distress.  Looks uncomfortable in NAD  Musculoskeletal:  (+) right standing flexion test; right pelvic outflare with SI joint restriction and ASIS TTP; hypertrophy right inguinal tendon and TTP; reduced right hip flexion; no short leg; sacral torsion; OA slightly extended; paravertebral lumbar, thoracic and cervical muscle hypertrophy with ropy tissue texture changes; increased lumbar lordosis; antalgic gait; right distal achilles tendon TTP with palpable hard knot which is chronic; right lateral malleolus swelling and TTP; reduced eversion and inversion of ankle; dorsiflexion > plantarflexion strength reduced; no calf TTP   Neurological: She is alert and oriented to person, place, and time.  Skin: Skin is warm and dry. No rash noted.     Psychiatric: She has a normal mood and affect. Her behavior is normal. Judgment and thought content normal.     Labs reviewed: None  Dg Abd 1 View  03/04/2015   CLINICAL DATA:  Acute onset of epigastric abdominal pain, vomiting and diarrhea. Excessive belching. Initial encounter.  EXAM: ABDOMEN - 1 VIEW  COMPARISON:  CT of the abdomen and pelvis performed 07/29/2013  FINDINGS: The visualized bowel gas pattern is unremarkable. Scattered air and stool filled loops of colon are seen; no abnormal dilatation of small bowel loops is seen to suggest small bowel obstruction. No free intra-abdominal air is identified, though evaluation for free air is limited on a  single supine view.  The visualized osseous structures are within normal limits; the sacroiliac joints are unremarkable in appearance. Clips are noted within the right upper quadrant, reflecting prior cholecystectomy.  IMPRESSION: Unremarkable bowel gas pattern; no free intra-abdominal air seen. Moderate amount of stool noted in the colon.   Electronically Signed   By: Garald Balding M.D.   On: 03/04/2015 01:56     Assessment/Plan   ICD-9-CM ICD-10-CM   1. Right ankle sprain, initial encounter 845.00 S93.401A   2. Chronic sciatica, right - worse 724.3 M54.31   3. Somatic dysfunction of lower extremity 739.6 M99.06   4. Somatic dysfunction  of pelvic region 739.5 M99.05   5. Somatic dysfunction of lumbar region 739.3 M99.03   6. Somatic dysfunction of sacral region 739.4 M99.04   7. Somatic dysfunction of cervical region 739.1 M99.01    PROCEDURE NOTE:  After verbal consent obtained, OMT utilized with improvement in ROM, tissue texture, asymmetry and tenderness. Pt tolerated procedure well.  OMT Treatment 03/23/2015 02/25/2015 01/28/2015 12/17/2014 12/01/2014  Neck  CS;DIR;IND/INR;ME;MFR;ST CS;DIR;IND/INR;ME;MFR;ST - CS;DIR;IND/INR;ME;MFR;ST -  Neck Response I I - I -  Lumbar IND/INR;LAS;MFR;ST IND/INR;LAS;MFR;ST IND/INR;LAS;MFR;ST IND/INR;MFR;ST;DIR LAS;MFR;ST  Lumbar Response I I I I I  Sacrum IND/INR;MFR;ST IND/INR;MFR;ST IND/INR;MFR;ST MFR;ST MFR;ST  Sacrum Response I I I I I  Pelvis LAS;MFR;ST;IND/INR LAS;ME;MFR;ST;IND/INR;DIR LAS;ME;MFR;ST;IND/INR;DIR MFR;ST;LAS ME;LAS;MFR;ST  Pelvis Response I I I I I  Lower Ext. LAS;MFR;ST;IND/INR;DIR LAS;MFR;ST;IND/INR;DIR LAS;MFR;ST;IND/INR;DIR MFR;ST;DIR;LAS;IND/INR LAS;MFR;ST  Lower Ext. Response I I I I I    --Push fluids and rest today. No heavy lifting. May resume nml activity in the AM.  --Continue wearing ankle brace as needed  --f/u with Ortho prn  --Follow up in 2 weeks for OMM  [facility] S. Perlie Gold  [facility] [address] [place], [place] [address] [phone] Cell (Monday-Friday 8 AM - 5 PM) [phone] After 5 PM and follow prompts

## 2015-03-23 NOTE — Patient Instructions (Signed)
Push fluids and rest today. No heavy lifting. May resume nml activity in the AM.  Continue wearing ankle brace as needed  Follow up in 2 weeks for OMM

## 2015-04-13 ENCOUNTER — Ambulatory Visit (INDEPENDENT_AMBULATORY_CARE_PROVIDER_SITE_OTHER): Payer: BC Managed Care – PPO | Admitting: Internal Medicine

## 2015-04-13 ENCOUNTER — Encounter: Payer: Self-pay | Admitting: Internal Medicine

## 2015-04-13 VITALS — BP 120/80 | HR 74 | Temp 98.2°F | Resp 18 | Ht 60.0 in | Wt 228.6 lb

## 2015-04-13 DIAGNOSIS — M9905 Segmental and somatic dysfunction of pelvic region: Secondary | ICD-10-CM

## 2015-04-13 DIAGNOSIS — M9903 Segmental and somatic dysfunction of lumbar region: Secondary | ICD-10-CM | POA: Diagnosis not present

## 2015-04-13 DIAGNOSIS — M5431 Sciatica, right side: Secondary | ICD-10-CM | POA: Diagnosis not present

## 2015-04-13 DIAGNOSIS — M9906 Segmental and somatic dysfunction of lower extremity: Secondary | ICD-10-CM

## 2015-04-13 DIAGNOSIS — S93401D Sprain of unspecified ligament of right ankle, subsequent encounter: Secondary | ICD-10-CM | POA: Diagnosis not present

## 2015-04-13 DIAGNOSIS — M9904 Segmental and somatic dysfunction of sacral region: Secondary | ICD-10-CM

## 2015-04-13 NOTE — Patient Instructions (Signed)
Push fluids and rest today. No heavy lifting. May resume nml activity in the AM.  Use ankle brace as needed for ankle sprain  Follow up in 4 weeks for OMM

## 2015-04-13 NOTE — Progress Notes (Signed)
Patient ID: Mackenzie Weber, female   DOB: 1960-12-06, 54 y.o.   MRN: [id number]    Location:    [facility]   Place of Service:   OFFICE  Chief Complaint  Patient presents with  . Medical Management of Chronic Issues    HPI:  54 yo female seen today for f/u chronic sciatica with low back pain. She resumed exercise routine earlier this week. Her right foot sprain has improved but still uncomfortable. She had to start wearing the RLE boot in order for it to improve. Her pain is 4/10 in right ankle and feels stiff in left lower back after exercising earlier this week. No falls since last OV. No loss of bowel/bladder control. No numbness/ tingling  Past Medical History  Diagnosis Date  . Hypertension   . Asthma   . Seasonal allergies   . IBS (irritable bowel syndrome)   . Sciatica of left side   . Foot pain, right     Past Surgical History  Procedure Laterality Date  . Total abdominal hysterectomy w/ bilateral salpingoophorectomy  1989    LSO-1987; RsO-1998  . Knee surgery      right  . Appendectomy    . Cholecystectomy    . Tonsillectomy    . Wrist surgery      carpal tunnel repair  . Breast surgery  2001    /biopsy-benign  . Small bowel surgery    . Excisional hemorrhoidectomy    . Bladder suspension  2012    TVT  . Abdominal hysterectomy      Patient Care Team: Gildardo Cranker, MD as PCP - General  History   Social History  . Marital Status: Married    Spouse Name: N/A  . Number of Children: N/A  . Years of Education: N/A   Occupational History  . retired in 2015 from [employer].    Social History Main Topics  . Smoking status: Never Smoker   . Smokeless tobacco: Never Used  . Alcohol Use: No  . Drug Use: No  . Sexual Activity:    Partners: Male    Birth Control/ Protection: Surgical   Other Topics Concern  . Not on file   Social History Narrative     reports that she has never smoked. She has never used smokeless tobacco. She reports that she does  not drink alcohol or use illicit drugs.  Allergies  Allergen Reactions  . Cortisone     Red area around injection site x 45mth  . Depo-Medrol [Methylprednisolone Acetate] Nausea Only and Other (See Comments)    Dizziness  . Aspirin Anxiety and Other (See Comments)    Rapid Heart beat    Medications: Patient's Medications  New Prescriptions   No medications on file  Previous Medications   ALBUTEROL (PROVENTIL HFA;VENTOLIN HFA) 108 (90 BASE) MCG/ACT INHALER    Inhale 2 puffs into the lungs every 6 (six) hours as needed for wheezing or shortness of breath.   BUDESONIDE-FORMOTEROL (SYMBICORT) 80-4.5 MCG/ACT INHALER    Take 2 puffs first thing in am and then another 2 puffs about 12 hours later.   EPINEPHRINE (EPIPEN JR) 0.15 MG/0.3ML INJECTION    Inject 0.15 mg into the muscle daily as needed for anaphylaxis.    ESTRADIOL (ESTRACE) 0.1 MG/GM VAGINAL CREAM    Place 8.25 Applicatorfuls vaginally daily.   LOSARTAN-HYDROCHLOROTHIAZIDE (HYZAAR) 100-12.5 MG PER TABLET    Take 1 tablet by mouth daily.   MULTIPLE VITAMIN (MULTIVITAMIN)  CAPSULE    Take 1 capsule by mouth daily.   TRIAMTERENE-HYDROCHLOROTHIAZIDE (MAXZIDE) 75-50 MG PER TABLET    Take 0.5 tablets by mouth daily.   Modified Medications   No medications on file  Discontinued Medications   MELOXICAM (MOBIC) 15 MG TABLET    Take 15 mg by mouth daily.   SIMETHICONE (GAS-X) 80 MG CHEWABLE TABLET    Chew 1 tablet (80 mg total) by mouth every 6 (six) hours as needed for flatulence.    Review of Systems  Respiratory: Positive for wheezing.   Musculoskeletal: Positive for back pain, joint swelling and gait problem. Negative for neck pain.  Neurological: Negative for numbness.  All other systems reviewed and are negative.   Filed Vitals:   04/13/15 1317  BP: 120/80  Pulse: 74  Temp: 98.2 F (36.8 C)  TempSrc: Oral  Resp: 18  Height: 5' (1.524 m)  Weight: 228 lb 9.6 oz (103.692 kg)  SpO2: 99%   Body mass index is 44.65  kg/(m^2).  Physical Exam  Constitutional: She is oriented to person, place, and time. She appears well-developed and well-nourished. No distress.  Looks uncomfortable  Musculoskeletal: She exhibits edema and tenderness.  (+) left standing flexion test; sacral torsion; right SI joint restriction with ASIS TTP and short right leg; increased lordosis; gait antalgic; reduced right ankle ROM; strength intact; no distal swelling; pulses intact; paravertebral lumbar and thoracic muscle hypertrophy with ropy tissue texture changes  Neurological: She is alert and oriented to person, place, and time.  Skin: Skin is warm, dry and intact. No rash noted.     Psychiatric: She has a normal mood and affect. Her speech is normal and behavior is normal. Thought content normal.     Labs reviewed: No visits with results within 3 Month(s) from this visit. Latest known visit with results is:  Clinical Support on 11/11/2014  Component Date Value Ref Range Status  . FVC-Pre 11/11/2014 1.59   Final  . FVC-%Pred-Pre 11/11/2014 66   Final  . FVC-Post 11/11/2014 1.62   Final  . FVC-%Pred-Post 11/11/2014 67   Final  . FVC-%Change-Post 11/11/2014 1   Final  . FEV1-Pre 11/11/2014 1.25   Final  . FEV1-%Pred-Pre 11/11/2014 65   Final  . FEV1-Post 11/11/2014 1.32   Final  . FEV1-%Pred-Post 11/11/2014 68   Final  . FEV1-%Change-Post 11/11/2014 5   Final  . FEV6-Pre 11/11/2014 1.59   Final  . FEV6-%Pred-Pre 11/11/2014 68   Final  . FEV6-Post 11/11/2014 1.62   Final  . FEV6-%Pred-Post 11/11/2014 69   Final  . FEV6-%Change-Post 11/11/2014 1   Final  . Pre FEV1/FVC ratio 11/11/2014 79   Final  . FEV1FVC-%Pred-Pre 11/11/2014 97   Final  . Post FEV1/FVC ratio 11/11/2014 82   Final  . FEV1FVC-%Change-Post 11/11/2014 3   Final  . Pre FEV6/FVC Ratio 11/11/2014 100   Final  . FEV6FVC-%Pred-Pre 11/11/2014 103   Final  . Post FEV6/FVC ratio 11/11/2014 100   Final  . FEV6FVC-%Pred-Post 11/11/2014 103   Final  . FEF  25-75 Pre 11/11/2014 1.09   Final  . FEF2575-%Pred-Pre 11/11/2014 52   Final  . FEF 25-75 Post 11/11/2014 1.34   Final  . FEF2575-%Pred-Post 11/11/2014 64   Final  . FEF2575-%Change-Post 11/11/2014 22   Final  . DLCO unc 11/11/2014 17.67   Final  . DLCO unc % pred 11/11/2014 93   Final  . DL/VA 11/11/2014 5.76   Final  . DL/VA % pred 11/11/2014  135   Final    No results found.   Assessment/Plan   ICD-9-CM ICD-10-CM   1. Chronic sciatica, right 724.3 M54.31   2. Right ankle sprain, subsequent encounter - improving V58.89 S93.401D    845.00    3. Somatic dysfunction of lower extremity - stable 739.6 M99.06   4. Somatic dysfunction of lumbar region - stable 739.3 M99.03   5. Somatic dysfunction of pelvic region - stable 739.5 M99.05   6. Somatic dysfunction of sacral region - stable 739.4 M99.04    PROCEDURE NOTE:  After verbal consent obtained, OMT utilized with improvement in ROM, tissue texture, asymmetry and tenderness. Pt tolerated procedure well.  OMT Treatment 04/13/2015 03/24/2015 02/25/2015 01/28/2015 12/17/2014 12/01/2014  Neck  - CS;DIR;IND/INR;ME;MFR;ST CS;DIR;IND/INR;ME;MFR;ST - CS;DIR;IND/INR;ME;MFR;ST -  Neck Response - I I - I -  Lumbar IND/INR;LAS;MFR;ST IND/INR;LAS;MFR;ST IND/INR;LAS;MFR;ST IND/INR;LAS;MFR;ST IND/INR;MFR;ST;DIR LAS;MFR;ST  Lumbar Response I I I I I I  Sacrum IND/INR;MFR;ST IND/INR;MFR;ST IND/INR;MFR;ST IND/INR;MFR;ST MFR;ST MFR;ST  Sacrum Response I I I I I I  Pelvis LAS;MFR;ST;IND/INR;ME LAS;MFR;ST;IND/INR LAS;ME;MFR;ST;IND/INR;DIR LAS;ME;MFR;ST;IND/INR;DIR MFR;ST;LAS ME;LAS;MFR;ST  Pelvis Response I I I I I I  Lower Ext. LAS;MFR;ST;IND/INR;DIR LAS;MFR;ST;IND/INR;DIR LAS;MFR;ST;IND/INR;DIR LAS;MFR;ST;IND/INR;DIR MFR;ST;DIR;LAS;IND/INR LAS;MFR;ST  Lower Ext. Response I I I I I I   --Push fluids and rest today. No heavy lifting. May resume nml activity in the AM.  --Use ankle brace as needed for ankle sprain  --Follow up in 4 weeks for OMM  [facility]  S. Perlie Gold  [facility] [address] [place], [place] [address] [phone] Cell (Monday-Friday 8 AM - 5 PM) [phone] After 5 PM and follow prompts

## 2015-04-25 ENCOUNTER — Ambulatory Visit: Payer: BC Managed Care – PPO | Admitting: Dietician

## 2015-05-02 ENCOUNTER — Encounter (HOSPITAL_BASED_OUTPATIENT_CLINIC_OR_DEPARTMENT_OTHER): Payer: Self-pay | Admitting: Emergency Medicine

## 2015-05-02 ENCOUNTER — Emergency Department (HOSPITAL_BASED_OUTPATIENT_CLINIC_OR_DEPARTMENT_OTHER)
Admission: EM | Admit: 2015-05-02 | Discharge: 2015-05-02 | Disposition: A | Discharge status: Home / Self Care | Payer: BC Managed Care – PPO | Attending: Emergency Medicine | Admitting: Emergency Medicine

## 2015-05-02 DIAGNOSIS — S161XXA Strain of muscle, fascia and tendon at neck level, initial encounter: Secondary | ICD-10-CM | POA: Diagnosis not present

## 2015-05-02 DIAGNOSIS — Y9289 Other specified places as the place of occurrence of the external cause: Secondary | ICD-10-CM | POA: Diagnosis not present

## 2015-05-02 DIAGNOSIS — Z8739 Personal history of other diseases of the musculoskeletal system and connective tissue: Secondary | ICD-10-CM | POA: Insufficient documentation

## 2015-05-02 DIAGNOSIS — Z8719 Personal history of other diseases of the digestive system: Secondary | ICD-10-CM | POA: Diagnosis not present

## 2015-05-02 DIAGNOSIS — Y998 Other external cause status: Secondary | ICD-10-CM | POA: Diagnosis not present

## 2015-05-02 DIAGNOSIS — X58XXXA Exposure to other specified factors, initial encounter: Secondary | ICD-10-CM | POA: Insufficient documentation

## 2015-05-02 DIAGNOSIS — J45909 Unspecified asthma, uncomplicated: Secondary | ICD-10-CM | POA: Diagnosis not present

## 2015-05-02 DIAGNOSIS — S199XXA Unspecified injury of neck, initial encounter: Secondary | ICD-10-CM | POA: Diagnosis present

## 2015-05-02 DIAGNOSIS — Z79899 Other long term (current) drug therapy: Secondary | ICD-10-CM | POA: Diagnosis not present

## 2015-05-02 DIAGNOSIS — Z793 Long term (current) use of hormonal contraceptives: Secondary | ICD-10-CM | POA: Diagnosis not present

## 2015-05-02 DIAGNOSIS — I10 Essential (primary) hypertension: Secondary | ICD-10-CM | POA: Insufficient documentation

## 2015-05-02 DIAGNOSIS — Y9389 Activity, other specified: Secondary | ICD-10-CM | POA: Diagnosis not present

## 2015-05-02 NOTE — Discharge Instructions (Signed)
Cervical Sprain A cervical sprain is when the tissues (ligaments) that hold the neck bones in place stretch or tear. HOME CARE   Put ice on the injured area.  Put ice in a plastic bag.  Place a towel between your skin and the bag.  Leave the ice on for 15-20 minutes, 3-4 times a day.  You may have been given a collar to wear. This collar keeps your neck from moving while you heal.  Do not take the collar off unless told by your doctor.  If you have long hair, keep it outside of the collar.  Ask your doctor before changing the position of your collar. You may need to change its position over time to make it more comfortable.  If you are allowed to take off the collar for cleaning or bathing, follow your doctor's instructions on how to do it safely.  Keep your collar clean by wiping it with mild soap and water. Dry it completely. If the collar has removable pads, remove them every 1-2 days to hand wash them with soap and water. Allow them to air dry. They should be dry before you wear them in the collar.  Do not drive while wearing the collar.  Only take medicine as told by your doctor.  Keep all doctor visits as told.  Keep all physical therapy visits as told.  Adjust your work station so that you have good posture while you work.  Avoid positions and activities that make your problems worse.  Warm up and stretch before being active. GET HELP IF:  Your pain is not controlled with medicine.  You cannot take less pain medicine over time as planned.  Your activity level does not improve as expected. GET HELP RIGHT AWAY IF:   You are bleeding.  Your stomach is upset.  You have an allergic reaction to your medicine.  You develop new problems that you cannot explain.  You lose feeling (become numb) or you cannot move any part of your body (paralysis).  You have tingling or weakness in any part of your body.  Your symptoms get worse. Symptoms include:  Pain,  soreness, stiffness, puffiness (swelling), or a burning feeling in your neck.  Pain when your neck is touched.  Shoulder or upper back pain.  Limited ability to move your neck.  Headache.  Dizziness.  Your hands or arms feel week, lose feeling, or tingle.  Muscle spasms.  Difficulty swallowing or chewing. MAKE SURE YOU:   Understand these instructions.  Will watch your condition.  Will get help right away if you are not doing well or get worse. Document Released: 03/19/2008 Document Revised: 06/03/2013 Document Reviewed: 04/08/2013 [facility] Patient Information 2015 [place], [place]. This information is not intended to replace advice given to you by your health care provider. Make sure you discuss any questions you have with your health care provider.    Take 600 mg ibuprofen every 6 hours for pain.

## 2015-05-02 NOTE — ED Provider Notes (Signed)
CSN: [id number]     Arrival date & time 05/02/15  0931 History   First MD Initiated Contact with Patient 05/02/15 [phone]     Chief Complaint  Patient presents with  . Neck Pain     (Consider location/radiation/quality/duration/timing/severity/associated sxs/prior Treatment) Patient is a 54 y.o. female presenting with neck injury. The history is provided by the patient.  Neck Injury This is a new problem. Episode onset: 2 weeks ago. The problem occurs constantly. The problem has not changed since onset.Pertinent negatives include no chest pain, no abdominal pain, no headaches and no shortness of breath. Exacerbated by: movement of neck. Nothing relieves the symptoms. Treatments tried: muscle relaxants, tramadol. The treatment provided mild relief.    Past Medical History  Diagnosis Date  . Hypertension   . Asthma   . Seasonal allergies   . IBS (irritable bowel syndrome)   . Sciatica of left side   . Foot pain, right    Past Surgical History  Procedure Laterality Date  . Total abdominal hysterectomy w/ bilateral salpingoophorectomy  1989    LSO-1987; RsO-1998  . Knee surgery      right  . Appendectomy    . Cholecystectomy    . Tonsillectomy    . Wrist surgery      carpal tunnel repair  . Breast surgery  2001    /biopsy-benign  . Small bowel surgery    . Excisional hemorrhoidectomy    . Bladder suspension  2012    TVT  . Abdominal hysterectomy     Family History  Problem Relation Age of Onset  . Diabetes Father   . Hypertension Father   . Diabetes Mother   . Hypertension Mother   . Asthma Mother   . Diabetes Brother   . Hypertension Sister   . Diabetes Sister   . Cancer Sister 38    breast  . Hypertension Paternal Uncle    History  Substance Use Topics  . Smoking status: Never Smoker   . Smokeless tobacco: Never Used  . Alcohol Use: No   OB History    Gravida Para Term Preterm AB TAB SAB Ectopic Multiple Living   2 2             Review of Systems   Constitutional: Negative for fever and fatigue.  HENT: Negative for congestion and drooling.   Eyes: Negative for pain.  Respiratory: Negative for cough and shortness of breath.   Cardiovascular: Negative for chest pain.  Gastrointestinal: Negative for nausea, vomiting, abdominal pain and diarrhea.  Genitourinary: Negative for dysuria and hematuria.  Musculoskeletal: Negative for back pain, gait problem and neck pain.       Neck pain  Skin: Negative for color change.  Neurological: Negative for dizziness and headaches.  Hematological: Negative for adenopathy.  Psychiatric/Behavioral: Negative for behavioral problems.  All other systems reviewed and are negative.     Allergies  Cortisone; Depo-medrol; and Aspirin  Home Medications   Prior to Admission medications   Medication Sig Start Date End Date Taking? Authorizing Provider  albuterol (PROVENTIL HFA;VENTOLIN HFA) 108 (90 BASE) MCG/ACT inhaler Inhale 2 puffs into the lungs every 6 (six) hours as needed for wheezing or shortness of breath. 02/09/15   Tanda Rockers, MD  budesonide-formoterol [facility]) 80-4.5 MCG/ACT inhaler Take 2 puffs first thing in am and then another 2 puffs about 12 hours later. 02/09/15   Tanda Rockers, MD  EPINEPHrine (EPIPEN JR) 0.15 MG/0.3ML injection Inject 0.15 mg into the  muscle daily as needed for anaphylaxis.     Historical Provider, MD  estradiol (ESTRACE) 0.1 MG/GM vaginal cream Place 6.73 Applicatorfuls vaginally daily. 11/03/12   Earnstine Regal, PA-C  losartan-hydrochlorothiazide (HYZAAR) 100-12.5 MG per tablet Take 1 tablet by mouth daily. 09/04/14   Historical Provider, MD  Multiple Vitamin (MULTIVITAMIN) capsule Take 1 capsule by mouth daily.    Historical Provider, MD  triamterene-hydrochlorothiazide (MAXZIDE) 75-50 MG per tablet Take 0.5 tablets by mouth daily.     Historical Provider, MD   BP 149/61 mmHg  Pulse 68  Temp(Src) 97.9 F (36.6 C) (Oral)  Resp 16  Ht 5' (1.524 m)  Wt 224 lb  (101.606 kg)  BMI 43.75 kg/m2  SpO2 99% Physical Exam  Constitutional: She is oriented to person, place, and time. She appears well-developed and well-nourished.  HENT:  Head: Normocephalic.  Mouth/Throat: Oropharynx is clear and moist. No oropharyngeal exudate.  Eyes: Conjunctivae and EOM are normal. Pupils are equal, round, and reactive to light.  Neck: Normal range of motion. Neck supple.   Tenderness to palpation of the left lateral neck extending to the left trapezius.   No carotid bruits heard bilaterally.   Reproduction of pain when turning the head to the right.   no focal vertebral tenderness.  Cardiovascular: Normal rate, regular rhythm, normal heart sounds and intact distal pulses.  Exam reveals no gallop and no friction rub.   No murmur heard. Pulmonary/Chest: Effort normal and breath sounds normal. No respiratory distress. She has no wheezes.  Abdominal: Soft. Bowel sounds are normal. There is no tenderness. There is no rebound and no guarding.  Musculoskeletal: Normal range of motion. She exhibits no edema or tenderness.   Normal strength and sensation in bilateral upper extremities.   2+ and equal radial pulses bilaterally.  Neurological: She is alert and oriented to person, place, and time.  Skin: Skin is warm and dry.  Psychiatric: She has a normal mood and affect. Her behavior is normal.  Nursing note and vitals reviewed.   ED Course  Procedures (including critical care time) Labs Review Labs Reviewed - No data to display  Imaging Review No results found.   EKG Interpretation None      MDM   Final diagnoses:  Neck strain, initial encounter    10:01 AM 54 y.o. female  Who presents with left lateral neck pain extending to her trapezius for the last 2 weeks. She believes she may have strained her neck while [place]. She had muscle relaxants called in by her PCP which did not help. She also was seen at an urgent care  and prescribed tramadol and got a shot of Toradol. She has taken Advil with mild to moderate relief. She notes ongoing stiffness and soreness. She denies any fevers. Occasional headaches but none today. Vital signs unremarkable here. Pain is reproducible with palpation on exam and his muscular skeletal. Will recommend scheduled NSAIDs and follow-up with her PCP for repeat evaluation.    Pamella Pert, MD 05/02/15 1018

## 2015-05-02 NOTE — ED Notes (Signed)
MD at bedside. 

## 2015-05-02 NOTE — ED Notes (Signed)
Pt states she has had neck pain for about 2 weeks since sleeping wrong on vacations

## 2015-05-09 ENCOUNTER — Ambulatory Visit: Payer: BC Managed Care – PPO | Admitting: Dietician

## 2015-05-11 ENCOUNTER — Ambulatory Visit (INDEPENDENT_AMBULATORY_CARE_PROVIDER_SITE_OTHER): Payer: BC Managed Care – PPO | Admitting: Internal Medicine

## 2015-05-11 ENCOUNTER — Encounter: Payer: Self-pay | Admitting: Internal Medicine

## 2015-05-11 VITALS — BP 104/68 | HR 69 | Temp 98.3°F | Resp 20 | Ht 60.0 in | Wt 221.0 lb

## 2015-05-11 DIAGNOSIS — M9900 Segmental and somatic dysfunction of head region: Secondary | ICD-10-CM | POA: Diagnosis not present

## 2015-05-11 DIAGNOSIS — M9904 Segmental and somatic dysfunction of sacral region: Secondary | ICD-10-CM | POA: Diagnosis not present

## 2015-05-11 DIAGNOSIS — M5431 Sciatica, right side: Secondary | ICD-10-CM

## 2015-05-11 DIAGNOSIS — M9906 Segmental and somatic dysfunction of lower extremity: Secondary | ICD-10-CM | POA: Diagnosis not present

## 2015-05-11 DIAGNOSIS — M6248 Contracture of muscle, other site: Secondary | ICD-10-CM

## 2015-05-11 DIAGNOSIS — M9905 Segmental and somatic dysfunction of pelvic region: Secondary | ICD-10-CM | POA: Diagnosis not present

## 2015-05-11 DIAGNOSIS — M9903 Segmental and somatic dysfunction of lumbar region: Secondary | ICD-10-CM

## 2015-05-11 DIAGNOSIS — M9901 Segmental and somatic dysfunction of cervical region: Secondary | ICD-10-CM

## 2015-05-11 DIAGNOSIS — M2660 Temporomandibular joint disorder, unspecified: Secondary | ICD-10-CM

## 2015-05-11 DIAGNOSIS — M26609 Unspecified temporomandibular joint disorder, unspecified side: Secondary | ICD-10-CM

## 2015-05-11 DIAGNOSIS — M62838 Other muscle spasm: Secondary | ICD-10-CM

## 2015-05-11 NOTE — Progress Notes (Signed)
Patient ID: Mackenzie Weber, female   DOB: Dec 28, 1960, 54 y.o.   MRN: [id number]    Location:    [facility]   Place of Service:   OFFICE  Chief Complaint  Patient presents with  . Follow-up    OMM    HPI:  54 yo female seen today for f/u sciatica. She c/o neck pain x several days. No known trauma. It began after she sat up to read a book in bed. She has been seen by UC and the ED. No relief with muscle relaxers or analgesics. No recent xrays (C-spine last done in 2005; mild DDD of C5-C6). She did receive steroid injection at UC. Pain interrupts sleep. Pain radiates into left ear and causes tinnitus. No numbness/tingling. No loss of bowel/bladder control. Back pain and buttock pain stable  Past Medical History  Diagnosis Date  . Hypertension   . Asthma   . Seasonal allergies   . IBS (irritable bowel syndrome)   . Sciatica of left side   . Foot pain, right     Past Surgical History  Procedure Laterality Date  . Total abdominal hysterectomy w/ bilateral salpingoophorectomy  1989    LSO-1987; RsO-1998  . Knee surgery      right  . Appendectomy    . Cholecystectomy    . Tonsillectomy    . Wrist surgery      carpal tunnel repair  . Breast surgery  2001    /biopsy-benign  . Small bowel surgery    . Excisional hemorrhoidectomy    . Bladder suspension  2012    TVT  . Abdominal hysterectomy      Patient Care Team: Gildardo Cranker, MD as PCP - General  History   Social History  . Marital Status: Married    Spouse Name: N/A  . Number of Children: N/A  . Years of Education: N/A   Occupational History  . retired in 2015 from [employer].    Social History Main Topics  . Smoking status: Never Smoker   . Smokeless tobacco: Never Used  . Alcohol Use: No  . Drug Use: No  . Sexual Activity:    Partners: Male    Birth Control/ Protection: Surgical   Other Topics Concern  . Not on file   Social History Narrative     reports that she has never smoked. She has never  used smokeless tobacco. She reports that she does not drink alcohol or use illicit drugs.  Allergies  Allergen Reactions  . Cortisone     Red area around injection site x 57mth  . Depo-Medrol [Methylprednisolone Acetate] Nausea Only and Other (See Comments)    Dizziness  . Aspirin Anxiety and Other (See Comments)    Rapid Heart beat    Medications: Patient's Medications  New Prescriptions   No medications on file  Previous Medications   ALBUTEROL (PROVENTIL HFA;VENTOLIN HFA) 108 (90 BASE) MCG/ACT INHALER    Inhale 2 puffs into the lungs every 6 (six) hours as needed for wheezing or shortness of breath.   BUDESONIDE-FORMOTEROL (SYMBICORT) 80-4.5 MCG/ACT INHALER    Take 2 puffs first thing in am and then another 2 puffs about 12 hours later.   EPINEPHRINE (EPIPEN JR) 0.15 MG/0.3ML INJECTION    Inject 0.15 mg into the muscle daily as needed for anaphylaxis.    ESTRADIOL (ESTRACE) 0.1 MG/GM VAGINAL CREAM    Place 6.73 Applicatorfuls vaginally daily.   LOSARTAN-HYDROCHLOROTHIAZIDE (HYZAAR) 100-12.5 MG PER TABLET  Take 1 tablet by mouth daily.   MULTIPLE VITAMIN (MULTIVITAMIN) CAPSULE    Take 1 capsule by mouth daily.   TRIAMTERENE-HYDROCHLOROTHIAZIDE (MAXZIDE) 75-50 MG PER TABLET    Take 0.5 tablets by mouth daily.   Modified Medications   No medications on file  Discontinued Medications   No medications on file    Review of Systems  Constitutional: Positive for fatigue. Negative for fever, chills, diaphoresis, activity change and appetite change.  HENT: Positive for ear pain and tinnitus. Negative for sore throat.   Eyes: Negative for visual disturbance.  Respiratory: Negative for cough, chest tightness and shortness of breath.   Cardiovascular: Negative for chest pain, palpitations and leg swelling.  Gastrointestinal: Negative for nausea, vomiting, abdominal pain, diarrhea, constipation and blood in stool.  Genitourinary: Negative for dysuria.  Musculoskeletal: Positive for back  pain, arthralgias, gait problem and neck pain.  Neurological: Negative for dizziness, tremors, numbness and headaches.  Psychiatric/Behavioral: Positive for sleep disturbance. The patient is not nervous/anxious.     Filed Vitals:   05/11/15 1303  BP: 104/68  Pulse: 69  Temp: 98.3 F (36.8 C)  TempSrc: Oral  Resp: 20  Height: 5' (1.524 m)  Weight: 221 lb (100.245 kg)  SpO2: 98%   Body mass index is 43.16 kg/(m^2).  Physical Exam  Constitutional: She is oriented to person, place, and time. She appears well-developed and well-nourished. No distress.  Looks uncomfortable in NAD  Cardiovascular: Intact distal pulses.   +1 pitting LE edema b/l. No calf TTP  Musculoskeletal: She exhibits edema and tenderness.  Neg standing flexion test; right pelvic outflare with right ASIS TTP; right SI joint restriction; sacral torsion; gait antalgic; short left leg; paravertebral lumbar, thoracic and cervical muscle hypertrophy with ropy tissue texture changes; OA flexed; reduced R>L neck rotation; reduced right sidebending; left temporal bone internally rotated; left TMJ TTP with swelling and reduced translation of mandible to right; strength intact  Neurological: She is alert and oriented to person, place, and time.  Skin: Skin is warm and dry. No rash noted.     Psychiatric: She has a normal mood and affect. Her behavior is normal. Thought content normal.     Labs reviewed: none   No results found.   Assessment/Plan    ICD-9-CM ICD-10-CM   1. 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 Chronic sciatica, right - improved 724.3 M54.31   4. Somatic dysfunction of cervical region 739.1 M99.01   5. Somatic dysfunction of head region 739.0 M99.00   6. Somatic dysfunction of lumbar region 739.3 M99.03   7. Somatic dysfunction of pelvic region 739.5 M99.05   8. Somatic dysfunction of sacral region 739.4 M99.04   9. Somatic dysfunction of lower  extremity 739.6 M99.06      PROCEDURE NOTE:  After verbal consent obtained, OMT utilized with improvement in ROM, tissue texture, asymmetry and tenderness. Pt tolerated procedure well.  OMT Treatment 05/11/2015 04/13/2015 03/24/2015 02/25/2015 01/28/2015 12/17/2014 12/01/2014  Head/Face CR;MFR;ST;IND/INR;ME - - - - - -  Head/Face Response I - - - - - -  Neck  CS;IND/INR;MFR;ST;ME - CS;DIR;IND/INR;ME;MFR;ST CS;DIR;IND/INR;ME;MFR;ST - CS;DIR;IND/INR;ME;MFR;ST -  Neck Response I - I I - I -  Lumbar IND/INR;LAS;MFR;ST IND/INR;LAS;MFR;ST IND/INR;LAS;MFR;ST IND/INR;LAS;MFR;ST IND/INR;LAS;MFR;ST IND/INR;MFR;ST;DIR LAS;MFR;ST  Lumbar Response I I I I I I I  Sacrum IND/INR;MFR;ST IND/INR;MFR;ST IND/INR;MFR;ST IND/INR;MFR;ST IND/INR;MFR;ST MFR;ST MFR;ST  Sacrum Response I I I I I I I  Pelvis LAS;MFR;ST;IND/INR LAS;MFR;ST;IND/INR;ME  LAS;MFR;ST;IND/INR LAS;ME;MFR;ST;IND/INR;DIR LAS;ME;MFR;ST;IND/INR;DIR MFR;ST;LAS ME;LAS;MFR;ST  Pelvis Response I I I I I I I  Lower Ext. LAS;MFR;ST;IND/INR;DIR LAS;MFR;ST;IND/INR;DIR LAS;MFR;ST;IND/INR;DIR LAS;MFR;ST;IND/INR;DIR LAS;MFR;ST;IND/INR;DIR MFR;ST;DIR;LAS;IND/INR LAS;MFR;ST  Lower Ext. Response I I I I I I I    Push fluids and rest today. No heavy lifting. May resume nml activity in the AM.  Cont pain control  If cervical not improved by next assessment, will consider cervical spine xrays +/- MRI  F/u in 2-3 weeks for reassessment  Linus Weckerly S. Perlie Gold  [facility] [address] [place], [place] [address] [phone] Cell (Monday-Friday 8 AM - 5 PM) [phone] After 5 PM and follow prompts

## 2015-05-11 NOTE — Patient Instructions (Addendum)
Push fluids and rest today. No heavy lifting. May resume nml activity in the AM.  Follow up in 2-3 weeks for OMM  Continue muscle relaxer, ibuprofen and tramadol as needed

## 2015-05-25 ENCOUNTER — Ambulatory Visit (INDEPENDENT_AMBULATORY_CARE_PROVIDER_SITE_OTHER): Payer: BC Managed Care – PPO | Admitting: Internal Medicine

## 2015-05-25 ENCOUNTER — Encounter: Payer: Self-pay | Admitting: Internal Medicine

## 2015-05-25 VITALS — BP 102/78 | HR 89 | Temp 98.0°F | Resp 18 | Ht 60.0 in | Wt 222.8 lb

## 2015-05-25 DIAGNOSIS — M9900 Segmental and somatic dysfunction of head region: Secondary | ICD-10-CM | POA: Diagnosis not present

## 2015-05-25 DIAGNOSIS — I10 Essential (primary) hypertension: Secondary | ICD-10-CM | POA: Diagnosis not present

## 2015-05-25 DIAGNOSIS — M62838 Other muscle spasm: Secondary | ICD-10-CM | POA: Diagnosis not present

## 2015-05-25 DIAGNOSIS — M26609 Unspecified temporomandibular joint disorder, unspecified side: Secondary | ICD-10-CM

## 2015-05-25 DIAGNOSIS — M9904 Segmental and somatic dysfunction of sacral region: Secondary | ICD-10-CM | POA: Diagnosis not present

## 2015-05-25 DIAGNOSIS — M2660 Temporomandibular joint disorder, unspecified: Secondary | ICD-10-CM

## 2015-05-25 DIAGNOSIS — M9903 Segmental and somatic dysfunction of lumbar region: Secondary | ICD-10-CM

## 2015-05-25 DIAGNOSIS — M9901 Segmental and somatic dysfunction of cervical region: Secondary | ICD-10-CM | POA: Diagnosis not present

## 2015-05-25 DIAGNOSIS — M9905 Segmental and somatic dysfunction of pelvic region: Secondary | ICD-10-CM | POA: Diagnosis not present

## 2015-05-25 DIAGNOSIS — J45909 Unspecified asthma, uncomplicated: Secondary | ICD-10-CM

## 2015-05-25 DIAGNOSIS — M5431 Sciatica, right side: Secondary | ICD-10-CM | POA: Diagnosis not present

## 2015-05-25 DIAGNOSIS — M9906 Segmental and somatic dysfunction of lower extremity: Secondary | ICD-10-CM | POA: Diagnosis not present

## 2015-05-25 NOTE — Progress Notes (Signed)
Patient ID: Mackenzie Weber, female   DOB: December 19, 1960, 54 y.o.   MRN: [id number]    Location:    [facility]   Place of Service:   OFFICE  Chief Complaint  Patient presents with  . Follow-up    OMM    HPI:  54 yo female seen today for f/u. She reports neck pain improved. She had swelling while using muscle relaxer (robaxin 750mg ) and she stopped taking  No loss of bowel/bladder control. No HA or dizziness. No falls since last OV.  BP controlled on hyzaar and maxzide  She uses HRT due postmenopausal sx's  Asthma stable on symbicort and prn HFA  Past Medical History  Diagnosis Date  . Hypertension   . Asthma   . Seasonal allergies   . IBS (irritable bowel syndrome)   . Sciatica of left side   . Foot pain, right     Past Surgical History  Procedure Laterality Date  . Total abdominal hysterectomy w/ bilateral salpingoophorectomy  1989    LSO-1987; RsO-1998  . Knee surgery      right  . Appendectomy    . Cholecystectomy    . Tonsillectomy    . Wrist surgery      carpal tunnel repair  . Breast surgery  2001    /biopsy-benign  . Small bowel surgery    . Excisional hemorrhoidectomy    . Bladder suspension  2012    TVT  . Abdominal hysterectomy      Patient Care Team: Gildardo Cranker, MD as PCP - General  Social History   Social History  . Marital Status: Married    Spouse Name: N/A  . Number of Children: N/A  . Years of Education: N/A   Occupational History  . retired in 2015 from [employer].    Social History Main Topics  . Smoking status: Never Smoker   . Smokeless tobacco: Never Used  . Alcohol Use: No  . Drug Use: No  . Sexual Activity:    Partners: Male    Birth Control/ Protection: Surgical   Other Topics Concern  . Not on file   Social History Narrative     reports that she has never smoked. She has never used smokeless tobacco. She reports that she does not drink alcohol or use illicit drugs.  Allergies  Allergen Reactions  . Cortisone       Red area around injection site x 59mth  . Depo-Medrol [Methylprednisolone Acetate] Nausea Only and Other (See Comments)    Dizziness  . Aspirin Anxiety and Other (See Comments)    Rapid Heart beat    Medications: Patient's Medications  New Prescriptions   No medications on file  Previous Medications   ALBUTEROL (PROVENTIL HFA;VENTOLIN HFA) 108 (90 BASE) MCG/ACT INHALER    Inhale 2 puffs into the lungs every 6 (six) hours as needed for wheezing or shortness of breath.   BUDESONIDE-FORMOTEROL (SYMBICORT) 80-4.5 MCG/ACT INHALER    Take 2 puffs first thing in am and then another 2 puffs about 12 hours later.   EPINEPHRINE (EPIPEN JR) 0.15 MG/0.3ML INJECTION    Inject 0.15 mg into the muscle daily as needed for anaphylaxis.    ESTRADIOL (ESTRACE) 0.1 MG/GM VAGINAL CREAM    Place 9.67 Applicatorfuls vaginally daily.   LOSARTAN-HYDROCHLOROTHIAZIDE (HYZAAR) 100-12.5 MG PER TABLET    Take 1 tablet by mouth daily.   MULTIPLE VITAMIN (MULTIVITAMIN) CAPSULE    Take 1 capsule by mouth daily.  TRIAMTERENE-HYDROCHLOROTHIAZIDE (MAXZIDE) 75-50 MG PER TABLET    Take 0.5 tablets by mouth daily.   Modified Medications   No medications on file  Discontinued Medications   No medications on file    Review of Systems  Constitutional: Negative for fever, chills, diaphoresis, activity change, appetite change and fatigue.  HENT: Negative for ear pain and sore throat.   Eyes: Negative for visual disturbance.  Respiratory: Positive for shortness of breath and wheezing. Negative for cough and chest tightness.   Cardiovascular: Positive for leg swelling. Negative for chest pain and palpitations.  Gastrointestinal: Negative for nausea, vomiting, abdominal pain, diarrhea, constipation and blood in stool.  Genitourinary: Negative for dysuria.  Musculoskeletal: Positive for back pain, arthralgias and neck pain.  Neurological: Negative for dizziness, tremors, numbness and headaches.  Psychiatric/Behavioral:  Negative for sleep disturbance. The patient is not nervous/anxious.     Filed Vitals:   05/25/15 1312  BP: 102/78  Pulse: 89  Temp: 98 F (36.7 C)  TempSrc: Oral  Resp: 18  Height: 5' (1.524 m)  Weight: 222 lb 12.8 oz (101.061 kg)  SpO2: 98%   Body mass index is 43.51 kg/(m^2).  Physical Exam  Constitutional: She is oriented to person, place, and time. She appears well-developed and well-nourished. No distress.  Neck: Neck supple. Carotid bruit is not present. No tracheal deviation present.  Cardiovascular: Normal rate, regular rhythm, normal heart sounds and intact distal pulses.  Exam reveals no gallop and no friction rub.   No murmur heard. No LE edema b/l. no calf TTP.   Pulmonary/Chest: Effort normal. No respiratory distress. She has wheezes (occasional end expiratory at base R>L). She has no rales.  Abdominal: Soft. Bowel sounds are normal. She exhibits no distension and no mass. There is no hepatomegaly. There is no tenderness. There is no rebound and no guarding.  Musculoskeletal: She exhibits edema and tenderness.  (+) right standing flexion test; left short leg; sacral torsion; R>L SI joint restriction with ASIS TTP; right pelvic outflare; OA extended; left TMJ TTP with hypertrophy of masseter and pterygoid muscles; left temporal bone internally rotated; CRI intact; paravertebral lumbar, thoracic and cervical muscle hypertrophy with ropy tissue texture changes; strength intact; ROM improved in neck; reduced ROM in hip and back  Lymphadenopathy:    She has no cervical adenopathy.  Neurological: She is alert and oriented to person, place, and time.  Skin: Skin is warm and dry. No rash noted.     Psychiatric: She has a normal mood and affect. Her behavior is normal. Judgment and thought content normal.     Labs reviewed:   None    Assessment/Plan   ICD-9-CM ICD-10-CM   1. Essential hypertension - stable 401.9 I10 CMP     CBC with Differential  2. Muscle spasm of  left lower extremity - new 728.85 M62.838 Magnesium  3. TMJ dysfunction, left - stable 524.60 M26.60   4. Chronic sciatica, right 724.3 M54.31   5. Asthma, chronic, unspecified asthma severity, uncomplicated - stable 629.52 J45.909   6. Somatic dysfunction of pelvic region 739.5 M99.05   7. Somatic dysfunction of cervical region 739.1 M99.01   8. Somatic dysfunction of lumbar region 739.3 M99.03   9. Somatic dysfunction of lower extremity 739.6 M99.06   10. Somatic dysfunction of head region - improving 739.0 M99.00   11. Somatic dysfunction of sacral region 739.4 M99.04     PROCEDURE NOTE:  After verbal consent obtained, OMT utilized with improvement in ROM, tissue texture, asymmetry  and tenderness. Pt tolerated procedure well.  OMT Treatment 05/25/2015 05/11/2015 05/11/2015 04/13/2015 03/24/2015 02/25/2015 01/28/2015  Head/Face CR;MFR;ST;IND/INR;ME CR;MFR;ST;IND/INR;ME CR;MFR;ST;IND/INR - - - -  Head/Face Response I I I - - - -  Neck  CS;IND/INR;MFR;ST CS;IND/INR;MFR;ST;ME CS;IND/INR;MFR;ST;ME - CS;DIR;IND/INR;ME;MFR;ST CS;DIR;IND/INR;ME;MFR;ST -  Neck Response I I I - I I -  Lumbar IND/INR;LAS;MFR;ST IND/INR;LAS;MFR;ST IND/INR;LAS;MFR;ST IND/INR;LAS;MFR;ST IND/INR;LAS;MFR;ST IND/INR;LAS;MFR;ST IND/INR;LAS;MFR;ST  Lumbar Response I I I I I I I  Sacrum IND/INR;MFR;ST IND/INR;MFR;ST IND/INR;MFR;ST IND/INR;MFR;ST IND/INR;MFR;ST IND/INR;MFR;ST IND/INR;MFR;ST  Sacrum Response I I I I I I I  Pelvis LAS;MFR;ST;IND/INR LAS;MFR;ST;IND/INR LAS;MFR;ST;IND/INR LAS;MFR;ST;IND/INR;ME LAS;MFR;ST;IND/INR LAS;ME;MFR;ST;IND/INR;DIR LAS;ME;MFR;ST;IND/INR;DIR  Pelvis Response I I I I I I I  Lower Ext. LAS;MFR;ST;IND/INR;DIR LAS;MFR;ST;IND/INR;DIR LAS;MFR;ST;IND/INR;DIR LAS;MFR;ST;IND/INR;DIR LAS;MFR;ST;IND/INR;DIR LAS;MFR;ST;IND/INR;DIR LAS;MFR;ST;IND/INR;DIR  Lower Ext. Response I I I I I I I     Push fluids and rest today. No heavy lifting. May resume nml activity in the AM.  Check CMP, CBC w diff and  Mg  Will discuss OLD labs at next OV. She is interested in additional tests  Follow up in 4 weeks for OMM and prn  Gustavo Meditz S. Perlie Gold  [facility] [address] [place], [place] [address] [phone] Cell (Monday-Friday 8 AM - 5 PM) [phone] After 5 PM and follow prompts

## 2015-05-25 NOTE — Patient Instructions (Signed)
Push fluids and rest today. No heavy lifting. May resume nml activity in the AM.  Follow up in 4 weeks for OMM  Will call with lab results

## 2015-05-26 LAB — COMPREHENSIVE METABOLIC PANEL
A/G RATIO: 1.5 (ref 1.1–2.5)
ALT: 13 IU/L (ref 0–32)
AST: 15 IU/L (ref 0–40)
Albumin: 4.1 g/dL (ref 3.5–5.5)
Alkaline Phosphatase: 75 IU/L (ref 39–117)
BUN / CREAT RATIO: 16 (ref 9–23)
BUN: 14 mg/dL (ref 6–24)
CO2: 27 mmol/L (ref 18–29)
CREATININE: 0.88 mg/dL (ref 0.57–1.00)
Calcium: 9.7 mg/dL (ref 8.7–10.2)
Chloride: 101 mmol/L (ref 97–108)
GFR calc Af Amer: 87 mL/min/{1.73_m2} (ref >59)
GFR, EST NON AFRICAN AMERICAN: 75 mL/min/{1.73_m2} (ref >59)
GLOBULIN, TOTAL: 2.7 g/dL (ref 1.5–4.5)
Glucose: 116 mg/dL — ABNORMAL HIGH (ref 65–99)
POTASSIUM: 3.9 mmol/L (ref 3.5–5.2)
SODIUM: 142 mmol/L (ref 134–144)
Total Protein: 6.8 g/dL (ref 6.0–8.5)

## 2015-05-26 LAB — CBC WITH DIFFERENTIAL/PLATELET
BASOS: 1 %
Basophils Absolute: 0 10*3/uL (ref 0.0–0.2)
EOS (ABSOLUTE): 0.2 10*3/uL (ref 0.0–0.4)
Eos: 2 %
HEMOGLOBIN: 10.2 g/dL — AB (ref 11.1–15.9)
Hematocrit: 34.3 % (ref 34.0–46.6)
IMMATURE GRANS (ABS): 0 10*3/uL (ref 0.0–0.1)
Immature Granulocytes: 0 %
LYMPHS ABS: 2.7 10*3/uL (ref 0.7–3.1)
Lymphs: 40 %
MCH: 19.7 pg — AB (ref 26.6–33.0)
MCHC: 29.7 g/dL — AB (ref 31.5–35.7)
MCV: 66 fL — ABNORMAL LOW (ref 79–97)
MONOS ABS: 0.4 10*3/uL (ref 0.1–0.9)
Monocytes: 6 %
NEUTROS PCT: 51 %
Neutrophils Absolute: 3.4 10*3/uL (ref 1.4–7.0)
Platelets: 349 10*3/uL (ref 150–379)
RBC: 5.18 x10E6/uL (ref 3.77–5.28)
RDW: 17.9 % — ABNORMAL HIGH (ref 12.3–15.4)
WBC: 6.6 10*3/uL (ref 3.4–10.8)

## 2015-05-26 LAB — MAGNESIUM: Magnesium: 2.2 mg/dL (ref 1.6–2.3)

## 2015-05-27 ENCOUNTER — Encounter: Payer: Self-pay | Admitting: *Deleted

## 2015-05-28 LAB — IRON AND TIBC
Iron Saturation: 14 % — ABNORMAL LOW (ref 15–55)
Iron: 38 ug/dL (ref 27–159)
TIBC: 267 ug/dL (ref 250–450)
UIBC: 229 ug/dL (ref 131–425)

## 2015-05-28 LAB — SPECIMEN STATUS REPORT

## 2015-05-28 LAB — FERRITIN: FERRITIN: 28 ng/mL (ref 15–150)

## 2015-06-22 ENCOUNTER — Encounter: Payer: Self-pay | Admitting: Internal Medicine

## 2015-06-22 ENCOUNTER — Ambulatory Visit (INDEPENDENT_AMBULATORY_CARE_PROVIDER_SITE_OTHER): Payer: BC Managed Care – PPO | Admitting: Internal Medicine

## 2015-06-22 VITALS — BP 120/86 | HR 73 | Temp 98.5°F | Resp 20 | Ht 60.0 in | Wt 225.4 lb

## 2015-06-22 DIAGNOSIS — M9905 Segmental and somatic dysfunction of pelvic region: Secondary | ICD-10-CM | POA: Diagnosis not present

## 2015-06-22 DIAGNOSIS — M9901 Segmental and somatic dysfunction of cervical region: Secondary | ICD-10-CM | POA: Diagnosis not present

## 2015-06-22 DIAGNOSIS — M5431 Sciatica, right side: Secondary | ICD-10-CM | POA: Diagnosis not present

## 2015-06-22 DIAGNOSIS — J4530 Mild persistent asthma, uncomplicated: Secondary | ICD-10-CM

## 2015-06-22 DIAGNOSIS — M62838 Other muscle spasm: Secondary | ICD-10-CM | POA: Diagnosis not present

## 2015-06-22 DIAGNOSIS — M9903 Segmental and somatic dysfunction of lumbar region: Secondary | ICD-10-CM

## 2015-06-22 DIAGNOSIS — M9906 Segmental and somatic dysfunction of lower extremity: Secondary | ICD-10-CM | POA: Diagnosis not present

## 2015-06-22 DIAGNOSIS — M9904 Segmental and somatic dysfunction of sacral region: Secondary | ICD-10-CM | POA: Diagnosis not present
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
## 2015-06-22 NOTE — Patient Instructions (Signed)
Push fluids and rest today. No heavy lifting. May resume nml activity in the AM.  May take plain OTC antihistamine daily for seasonal allergy  Wear mask when dusting  Follow up 4 weeks for OMM

## 2015-07-20 ENCOUNTER — Ambulatory Visit (INDEPENDENT_AMBULATORY_CARE_PROVIDER_SITE_OTHER): Payer: BC Managed Care – PPO | Admitting: Internal Medicine

## 2015-07-20 ENCOUNTER — Encounter: Payer: Self-pay | Admitting: Internal Medicine

## 2015-07-20 VITALS — BP 126/82 | HR 79 | Temp 97.8°F | Ht 60.0 in | Wt 225.0 lb

## 2015-07-20 DIAGNOSIS — M9905 Segmental and somatic dysfunction of pelvic region: Secondary | ICD-10-CM

## 2015-07-20 DIAGNOSIS — M9906 Segmental and somatic dysfunction of lower extremity: Secondary | ICD-10-CM | POA: Diagnosis not present

## 2015-07-20 DIAGNOSIS — M5431 Sciatica, right side: Secondary | ICD-10-CM | POA: Diagnosis not present

## 2015-07-20 DIAGNOSIS — M9901 Segmental and somatic dysfunction of cervical region: Secondary | ICD-10-CM

## 2015-07-20 DIAGNOSIS — M9904 Segmental and somatic dysfunction of sacral region: Secondary | ICD-10-CM | POA: Diagnosis not present

## 2015-07-20 DIAGNOSIS — M9903 Segmental and somatic dysfunction of lumbar region: Secondary | ICD-10-CM

## 2015-07-20 MED ORDER — MELOXICAM 7.5 MG PO TABS: 7.5000 mg | ORAL_TABLET | Freq: Every day | ORAL | Status: DC

## 2015-07-20 NOTE — Patient Instructions (Signed)
Push fluids and rest today. No heavy lifting. May resume nml activity in the AM.  May take up tp 2 tabs of meloxicam daily for arthritic pain  Continue other medications as ordered  Follow up in 4 weeks for OMM

## 2015-07-20 NOTE — Progress Notes (Signed)
Patient ID: Mackenzie Weber, female   DOB: December 21, 1960, 54 y.o.   MRN: [id number]    Location:    [facility]   Place of Service:   OFFICE  Chief Complaint  Patient presents with  . Follow-up    OMM    HPI:  54 yo female seen today for f/u chronic pain and sciatica. She reports pain is stable overall. She has resumed exercise routine and has hired a Physiological scientist. She has neck and right hip discomfort. No falls. She would like a new rx for meloxicam which she has taken in the past for knee pain. No numbness/tingling. No loss of bowel/bladder control.   Past Medical History  Diagnosis Date  . Hypertension   . Asthma   . Seasonal allergies   . IBS (irritable bowel syndrome)   . Sciatica of left side   . Foot pain, right   . Anemia     Past Surgical History  Procedure Laterality Date  . Total abdominal hysterectomy w/ bilateral salpingoophorectomy  1989    LSO-1987; RsO-1998  . Knee surgery      right  . Appendectomy    . Cholecystectomy    . Tonsillectomy    . Wrist surgery      carpal tunnel repair  . Breast surgery  2001    /biopsy-benign  . Small bowel surgery    . Excisional hemorrhoidectomy    . Bladder suspension  2012    TVT  . Abdominal hysterectomy      Patient Care Team: Gildardo Cranker, MD as PCP - General  Social History   Social History  . Marital Status: Married    Spouse Name: N/A  . Number of Children: N/A  . Years of Education: N/A   Occupational History  . retired in 2015 from [employer].    Social History Main Topics  . Smoking status: Never Smoker   . Smokeless tobacco: Never Used  . Alcohol Use: No  . Drug Use: No  . Sexual Activity:    Partners: Male    Birth Control/ Protection: Surgical   Other Topics Concern  . Not on file   Social History Narrative     reports that she has never smoked. She has never used smokeless tobacco. She reports that she does not drink alcohol or use illicit drugs.  Allergies  Allergen  Reactions  . Cortisone     Red area around injection site x 23mth  . Depo-Medrol [Methylprednisolone Acetate] Nausea Only and Other (See Comments)    Dizziness  . Aspirin Anxiety and Other (See Comments)    Rapid Heart beat    Medications: Patient's Medications  New Prescriptions   MELOXICAM (MOBIC) 7.5 MG TABLET    Take 1 tablet (7.5 mg total) by mouth daily. May take up to 2 tabs po daily prn severe pain  Previous Medications   ALBUTEROL (PROVENTIL HFA;VENTOLIN HFA) 108 (90 BASE) MCG/ACT INHALER    Inhale 2 puffs into the lungs every 6 (six) hours as needed for wheezing or shortness of breath.   BUDESONIDE-FORMOTEROL (SYMBICORT) 80-4.5 MCG/ACT INHALER    Take 2 puffs first thing in am and then another 2 puffs about 12 hours later.   EPINEPHRINE (EPIPEN JR) 0.15 MG/0.3ML INJECTION    Inject 0.15 mg into the muscle daily as needed for anaphylaxis.    ESTRADIOL (ESTRACE) 0.1 MG/GM VAGINAL CREAM    Place 1.63 Applicatorfuls vaginally daily.   LOSARTAN-HYDROCHLOROTHIAZIDE (HYZAAR) 100-12.5  MG PER TABLET    Take 1 tablet by mouth daily.   MULTIPLE VITAMIN (MULTIVITAMIN) CAPSULE    Take 1 capsule by mouth daily.   TRIAMTERENE-HYDROCHLOROTHIAZIDE (MAXZIDE) 75-50 MG PER TABLET    Take 0.5 tablets by mouth daily.   Modified Medications   No medications on file  Discontinued Medications   No medications on file    Review of Systems  Constitutional: Positive for fatigue.  Musculoskeletal: Positive for back pain, joint swelling, arthralgias and neck pain.  Neurological: Negative for numbness.    Filed Vitals:   07/20/15 1412  BP: 126/82  Pulse: 79  Temp: 97.8 F (36.6 C)  TempSrc: Oral  Height: 5' (1.524 m)  Weight: 225 lb (102.059 kg) (down 6 oz since last OV)  SpO2: 98%   Body mass index is 43.94 kg/(m^2).  Physical Exam  Constitutional: She is oriented to person, place, and time. She appears well-developed and well-nourished. No distress.  Musculoskeletal: She exhibits edema  and tenderness.  (+) right standing flexion test; right pelvic inflare with SI joint restriction and ASIS TTP; min short left leg; increased lumbar lordosis; OA extended; reduced ROM of cervical, sacral and lumbar spine; paravertebral lumbar, thoracic and cervical muscle hypertrophy with ropy tissue texture changes; strength intact; trace b/l LE edema; no calf TTP  Neurological: She is alert and oriented to person, place, and time.  Skin: Skin is warm and dry. No rash noted.     Psychiatric: She has a normal mood and affect. Her behavior is normal. Judgment and thought content normal.     Labs reviewed: Office Visit on 05/25/2015  Component Date Value Ref Range Status  . Glucose 05/25/2015 116* 65 - 99 mg/dL Final  . BUN 05/25/2015 14  6 - 24 mg/dL Final  . Creatinine, Ser 05/25/2015 0.88  0.57 - 1.00 mg/dL Final  . GFR calc non Af Amer 05/25/2015 75  >59 mL/min/1.73 Final  . GFR calc Af Amer 05/25/2015 87  >59 mL/min/1.73 Final  . BUN/Creatinine Ratio 05/25/2015 16  9 - 23 Final  . Sodium 05/25/2015 142  134 - 144 mmol/L Final  . Potassium 05/25/2015 3.9  3.5 - 5.2 mmol/L Final  . Chloride 05/25/2015 101  97 - 108 mmol/L Final  . CO2 05/25/2015 27  18 - 29 mmol/L Final  . Calcium 05/25/2015 9.7  8.7 - 10.2 mg/dL Final  . Total Protein 05/25/2015 6.8  6.0 - 8.5 g/dL Final  . Albumin 05/25/2015 4.1  3.5 - 5.5 g/dL Final  . Globulin, Total 05/25/2015 2.7  1.5 - 4.5 g/dL Final  . Albumin/Globulin Ratio 05/25/2015 1.5  1.1 - 2.5 Final  . Bilirubin Total 05/25/2015 <0.2  0.0 - 1.2 mg/dL Final  . Alkaline Phosphatase 05/25/2015 75  39 - 117 IU/L Final  . AST 05/25/2015 15  0 - 40 IU/L Final  . ALT 05/25/2015 13  0 - 32 IU/L Final  . WBC 05/25/2015 6.6  3.4 - 10.8 x10E3/uL Final  . RBC 05/25/2015 5.18  3.77 - 5.28 x10E6/uL Final  . Hemoglobin 05/25/2015 10.2* 11.1 - 15.9 g/dL Final  . Hematocrit 05/25/2015 34.3  34.0 - 46.6 % Final  . MCV 05/25/2015 66* 79 - 97 fL Final  . MCH  05/25/2015 19.7* 26.6 - 33.0 pg Final  . MCHC 05/25/2015 29.7* 31.5 - 35.7 g/dL Final  . RDW 05/25/2015 17.9* 12.3 - 15.4 % Final  . Platelets 05/25/2015 349  150 - 379 x10E3/uL Final  . Neutrophils 05/25/2015  51   Final  . Lymphs 05/25/2015 40   Final  . Monocytes 05/25/2015 6   Final  . Eos 05/25/2015 2   Final  . Basos 05/25/2015 1   Final  . Neutrophils Absolute 05/25/2015 3.4  1.4 - 7.0 x10E3/uL Final  . Lymphocytes Absolute 05/25/2015 2.7  0.7 - 3.1 x10E3/uL Final  . Monocytes Absolute 05/25/2015 0.4  0.1 - 0.9 x10E3/uL Final  . EOS (ABSOLUTE) 05/25/2015 0.2  0.0 - 0.4 x10E3/uL Final  . Basophils Absolute 05/25/2015 0.0  0.0 - 0.2 x10E3/uL Final  . Immature Granulocytes 05/25/2015 0   Final  . Immature Grans (Abs) 05/25/2015 0.0  0.0 - 0.1 x10E3/uL Final  . Magnesium 05/25/2015 2.2  1.6 - 2.3 mg/dL Final   Comment: **Effective June 06, 2015 the reference interval**   for Magnesium, Serum will be changing to:                             0 - 30 days     1.6 - 2.4                             1 -  6 months   1.8 - 2.5                      7 months -  1 year     1.7 - 2.4                             2 -  5 years    1.6 - 2.3                             6 - 17 years    1.7 - 2.3                                >17 years    1.6 - 2.3   . Total Iron Binding Capacity 05/25/2015 267  250 - 450 ug/dL Final  . UIBC 05/25/2015 229  131 - 425 ug/dL Final  . Iron 05/25/2015 38  27 - 159 ug/dL Final  . Iron Saturation 05/25/2015 14* 15 - 55 % Final  . Ferritin 05/25/2015 28  15 - 150 ng/mL Final  . specimen status report 05/25/2015 Comment   Final   Comment: Written Authorization Written Authorization Written Authorization Received. Authorization received from [facility] 05-27-2015 Logged by Urban Gibson     No results found.   Assessment/Plan   ICD-9-CM ICD-10-CM   1. Chronic sciatica, right - exacerbated 724.3 M54.31   2. Somatic dysfunction of lower extremity 739.6 M99.06     3. Somatic dysfunction of pelvic region 739.5 M99.05   4. Somatic dysfunction of lumbar region 739.3 M99.03   5. Somatic dysfunction of sacral region 739.4 M99.04   6. Somatic dysfunction of cervical region 739.1 M99.01   7. Morbid obesity due to excess calories ([place]) - improving    PROCEDURE NOTE:  After verbal consent obtained, OMT utilized with improvement in ROM, tissue texture, asymmetry and tenderness. Pt tolerated procedure well.  OMT Treatment 07/20/2015 06/22/2015 05/25/2015 05/11/2015 05/11/2015 04/13/2015 03/24/2015  Head/Face - - CR;MFR;ST;IND/INR;ME CR;MFR;ST;IND/INR;ME CR;MFR;ST;IND/INR - -  Head/Face Response - -  I I I - -  Neck  CS;IND/INR;MFR;ST;ME CS;IND/INR;MFR;ST CS;IND/INR;MFR;ST CS;IND/INR;MFR;ST;ME CS;IND/INR;MFR;ST;ME - CS;DIR;IND/INR;ME;MFR;ST  Neck Response I I I I I - I  Lumbar IND/INR;LAS;MFR;ST IND/INR;LAS;MFR;ST IND/INR;LAS;MFR;ST IND/INR;LAS;MFR;ST IND/INR;LAS;MFR;ST IND/INR;LAS;MFR;ST IND/INR;LAS;MFR;ST  Lumbar Response I I I I I I I  Sacrum IND/INR;MFR;ST IND/INR;MFR;ST IND/INR;MFR;ST IND/INR;MFR;ST IND/INR;MFR;ST IND/INR;MFR;ST IND/INR;MFR;ST  Sacrum Response I I I I I I I  Pelvis LAS;MFR;ST;IND/INR;ME LAS;MFR;ST;IND/INR;ME LAS;MFR;ST;IND/INR LAS;MFR;ST;IND/INR LAS;MFR;ST;IND/INR LAS;MFR;ST;IND/INR;ME LAS;MFR;ST;IND/INR  Pelvis Response I I I I I I I  Lower Ext. LAS;MFR;ST;IND/INR;DIR LAS;MFR;ST;IND/INR;DIR LAS;MFR;ST;IND/INR;DIR LAS;MFR;ST;IND/INR;DIR LAS;MFR;ST;IND/INR;DIR LAS;MFR;ST;IND/INR;DIR LAS;MFR;ST;IND/INR;DIR  Lower Ext. Response I I I I I I I    Push fluids and rest today. No heavy lifting. May resume nml activity in the AM.  May take up tp 2 tabs of meloxicam daily for arthritic pain  Continue other medications as ordered  Continue exercise routine. Maintain a healthy diet  Follow up in 4 weeks for OMM  [facility] S. Perlie Gold  [facility] [address] [place], [place]  [address] [phone] Cell (Monday-Friday 8 AM - 5 PM) [phone] After 5 PM and follow prompts

## 2015-07-31 ENCOUNTER — Encounter (HOSPITAL_BASED_OUTPATIENT_CLINIC_OR_DEPARTMENT_OTHER): Payer: Self-pay | Admitting: Emergency Medicine

## 2015-07-31 ENCOUNTER — Emergency Department (HOSPITAL_BASED_OUTPATIENT_CLINIC_OR_DEPARTMENT_OTHER): Payer: BC Managed Care – PPO

## 2015-07-31 ENCOUNTER — Emergency Department (HOSPITAL_BASED_OUTPATIENT_CLINIC_OR_DEPARTMENT_OTHER)
Admission: EM | Admit: 2015-07-31 | Discharge: 2015-07-31 | Disposition: A | Discharge status: Home / Self Care | Payer: BC Managed Care – PPO | Attending: Emergency Medicine | Admitting: Emergency Medicine

## 2015-07-31 DIAGNOSIS — I10 Essential (primary) hypertension: Secondary | ICD-10-CM | POA: Insufficient documentation

## 2015-07-31 DIAGNOSIS — Z862 Personal history of diseases of the blood and blood-forming organs and certain disorders involving the immune mechanism: Secondary | ICD-10-CM | POA: Diagnosis not present

## 2015-07-31 DIAGNOSIS — J45909 Unspecified asthma, uncomplicated: Secondary | ICD-10-CM | POA: Diagnosis not present

## 2015-07-31 DIAGNOSIS — Z7951 Long term (current) use of inhaled steroids: Secondary | ICD-10-CM | POA: Insufficient documentation

## 2015-07-31 DIAGNOSIS — M79601 Pain in right arm: Secondary | ICD-10-CM | POA: Insufficient documentation

## 2015-07-31 DIAGNOSIS — R079 Chest pain, unspecified: Secondary | ICD-10-CM | POA: Diagnosis present

## 2015-07-31 DIAGNOSIS — Z79899 Other long term (current) drug therapy: Secondary | ICD-10-CM | POA: Insufficient documentation

## 2015-07-31 DIAGNOSIS — R0781 Pleurodynia: Secondary | ICD-10-CM

## 2015-07-31 LAB — BASIC METABOLIC PANEL
Anion gap: 7 (ref 5–15)
BUN: 17 mg/dL (ref 6–20)
CALCIUM: 9.9 mg/dL (ref 8.9–10.3)
CO2: 28 mmol/L (ref 22–32)
Chloride: 106 mmol/L (ref 101–111)
Creatinine, Ser: 0.88 mg/dL (ref 0.44–1.00)
GFR calc Af Amer: >60 mL/min (ref >60)
GFR calc non Af Amer: >60 mL/min (ref >60)
GLUCOSE: 113 mg/dL — AB (ref 65–99)
Potassium: 3.3 mmol/L — ABNORMAL LOW (ref 3.5–5.1)
SODIUM: 141 mmol/L (ref 135–145)

## 2015-07-31 LAB — CBC WITH DIFFERENTIAL/PLATELET
Basophils Absolute: 0 10*3/uL (ref 0.0–0.1)
Basophils Relative: 0 %
EOS PCT: 2 %
Eosinophils Absolute: 0.1 10*3/uL (ref 0.0–0.7)
HCT: 33.9 % — ABNORMAL LOW (ref 36.0–46.0)
Hemoglobin: 10.7 g/dL — ABNORMAL LOW (ref 12.0–15.0)
LYMPHS ABS: 2.7 10*3/uL (ref 0.7–4.0)
Lymphocytes Relative: 39 %
MCH: 20 pg — ABNORMAL LOW (ref 26.0–34.0)
MCHC: 31.6 g/dL (ref 30.0–36.0)
MCV: 63.5 fL — AB (ref 78.0–100.0)
MONO ABS: 0.5 10*3/uL (ref 0.1–1.0)
Monocytes Relative: 8 %
NEUTROS PCT: 51 %
Neutro Abs: 3.5 10*3/uL (ref 1.7–7.7)
Platelets: 351 10*3/uL (ref 150–400)
RBC: 5.34 MIL/uL — ABNORMAL HIGH (ref 3.87–5.11)
RDW: 17.4 % — AB (ref 11.5–15.5)
WBC: 6.8 10*3/uL (ref 4.0–10.5)

## 2015-07-31 LAB — TROPONIN I

## 2015-07-31 LAB — D-DIMER, QUANTITATIVE: D-Dimer, Quant: <0.27 ug/mL-FEU (ref 0.00–0.48)

## 2015-07-31 MED ORDER — IBUPROFEN 800 MG PO TABS
800.0000 mg | ORAL_TABLET | Freq: Once | ORAL | Status: AC
  Administered 2015-07-31: 800 mg via ORAL
  Filled 2015-07-31: qty 2

## 2015-07-31 MED ORDER — CYCLOBENZAPRINE HCL 10 MG PO TABS
5.0000 mg | ORAL_TABLET | Freq: Once | ORAL | Status: AC
  Administered 2015-07-31: 5 mg via ORAL
  Filled 2015-07-31: qty 1
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
## 2015-07-31 NOTE — Discharge Instructions (Signed)

## 2015-07-31 NOTE — ED Notes (Signed)
MD at bedside. 

## 2015-07-31 NOTE — ED Provider Notes (Signed)
CSN: [id number]     Arrival date & time 07/31/15  2018 History  By signing my name below, I, Helane Gunther, attest that this documentation has been prepared under the direction and in the presence of Ezequiel Essex, MD. Electronically Signed: Helane Gunther, ED Scribe. 07/31/2015. 9:08 PM.    Chief Complaint  Patient presents with  . Arm Pain   The history is provided by the patient. No language interpreter was used.   HPI Comments: Mackenzie Weber is a 54 y.o. female who presents to the Emergency Department complaining of right axilla and right-sided chest pain onset 3 days ago. Pt states she went to the gym and did water aerobics the day the pain started. She reports associated SOB. She states she has taken Meloxicam with mild relief. She notes exacerbation of the pain with movement and deep breathing. She reports a PMHx of asthma and HTN. She denies recent trauma or injury to the area. Pt denies a PMHx of heart issues. She also denies any recent long travels. Pt denies abdominal pain and back pain. Pt is allergic to aspirin, depo-medrol, and cortisone.  Past Medical History  Diagnosis Date  . Hypertension   . Asthma   . Seasonal allergies   . IBS (irritable bowel syndrome)   . Sciatica of left side   . Foot pain, right   . Anemia    Past Surgical History  Procedure Laterality Date  . Total abdominal hysterectomy w/ bilateral salpingoophorectomy  1989    LSO-1987; RsO-1998  . Knee surgery      right  . Appendectomy    . Cholecystectomy    . Tonsillectomy    . Wrist surgery      carpal tunnel repair  . Breast surgery  2001    /biopsy-benign  . Small bowel surgery    . Excisional hemorrhoidectomy    . Bladder suspension  2012    TVT  . Abdominal hysterectomy     Family History  Problem Relation Age of Onset  . Diabetes Father   . Hypertension Father   . Diabetes Mother   . Hypertension Mother   . Asthma Mother   . Diabetes Brother   . Hypertension Sister   .  Diabetes Sister   . Cancer Sister 81    breast  . Hypertension Paternal Uncle    Social History  Substance Use Topics  . Smoking status: Never Smoker   . Smokeless tobacco: Never Used  . Alcohol Use: No   OB History    Gravida Para Term Preterm AB TAB SAB Ectopic Multiple Living   2 2             Review of Systems A complete 10 system review of systems was obtained and all systems are negative except as noted in the HPI and PMH.   Allergies  Cortisone; Depo-medrol; and Aspirin  Home Medications   Prior to Admission medications   Medication Sig Start Date End Date Taking? Authorizing Provider  albuterol (PROVENTIL HFA;VENTOLIN HFA) 108 (90 BASE) MCG/ACT inhaler Inhale 2 puffs into the lungs every 6 (six) hours as needed for wheezing or shortness of breath. 02/09/15   Tanda Rockers, MD  budesonide-formoterol [facility]) 80-4.5 MCG/ACT inhaler Take 2 puffs first thing in am and then another 2 puffs about 12 hours later. 02/09/15   Tanda Rockers, MD  EPINEPHrine (EPIPEN JR) 0.15 MG/0.3ML injection Inject 0.15 mg into the muscle daily as needed for anaphylaxis.  Historical Provider, MD  estradiol (ESTRACE) 0.1 MG/GM vaginal cream Place 5.17 Applicatorfuls vaginally daily. 11/03/12   Earnstine Regal, PA-C  losartan-hydrochlorothiazide (HYZAAR) 100-12.5 MG per tablet Take 1 tablet by mouth daily. 09/04/14   Historical Provider, MD  meloxicam (MOBIC) 7.5 MG tablet Take 1 tablet (7.5 mg total) by mouth daily. May take up to 2 tabs po daily prn severe pain 07/20/15   Gildardo Cranker, DO  Multiple Vitamin (MULTIVITAMIN) capsule Take 1 capsule by mouth daily.    Historical Provider, MD  triamterene-hydrochlorothiazide (MAXZIDE) 75-50 MG per tablet Take 0.5 tablets by mouth daily.     Historical Provider, MD   BP 107/63 mmHg  Pulse 66  Temp(Src) 97.8 F (36.6 C) (Oral)  Resp 19  Ht 5' (1.524 m)  Wt 224 lb (101.606 kg)  BMI 43.75 kg/m2  SpO2 99% Physical Exam  Constitutional: She is  oriented to person, place, and time. She appears well-developed and well-nourished. No distress.  HENT:  Head: Normocephalic and atraumatic.  Mouth/Throat: Oropharynx is clear and moist. No oropharyngeal exudate.  Eyes: Conjunctivae and EOM are normal. Pupils are equal, round, and reactive to light.  Neck: Normal range of motion. Neck supple.  No meningismus.  Cardiovascular: Normal rate, regular rhythm, normal heart sounds and intact distal pulses.   No murmur heard. RRR  Pulmonary/Chest: Effort normal and breath sounds normal. No respiratory distress.  Equal breath sounds  Abdominal: Soft. There is no tenderness. There is no rebound and no guarding.  Musculoskeletal: Normal range of motion. She exhibits tenderness. She exhibits no edema.  Right lateral rib TTP  Neurological: She is alert and oriented to person, place, and time. No cranial nerve deficit. She exhibits normal muscle tone. Coordination normal.  No ataxia on finger to nose bilaterally. No pronator drift. 5/5 strength throughout. CN 2-12 intact. Negative Romberg. Equal grip strength. Sensation intact. Gait is normal.   Skin: Skin is warm. No rash noted.  Psychiatric: She has a normal mood and affect. Her behavior is normal.  Nursing note and vitals reviewed.   ED Course  Procedures  DIAGNOSTIC STUDIES: Oxygen Saturation is 99% on RA, normal by my interpretation.    COORDINATION OF CARE: 9:06 PM - Discussed plans to order diagnostic studies and imaging. Pt advised of plan for treatment and pt agrees.  Labs Review Labs Reviewed  CBC WITH DIFFERENTIAL/PLATELET - Abnormal; Notable for the following:    RBC 5.34 (*)    Hemoglobin 10.7 (*)    HCT 33.9 (*)    MCV 63.5 (*)    MCH 20.0 (*)    RDW 17.4 (*)    All other components within normal limits  BASIC METABOLIC PANEL - Abnormal; Notable for the following:    Potassium 3.3 (*)    Glucose, Bld 113 (*)    All other components within normal limits  TROPONIN I   D-DIMER, QUANTITATIVE (NOT AT [facility])    Imaging Review Dg Chest 2 View  07/31/2015  CLINICAL DATA:  Right-sided chest pain for 4 days. Pain with deep breathing and movement. EXAM: CHEST  2 VIEW COMPARISON:  08/26/2014 FINDINGS: Cardiac silhouette is borderline enlarged. Normal mediastinal and hilar contours. Clear lungs. No pleural effusion or pneumothorax. Bony thorax is intact. IMPRESSION: No active cardiopulmonary disease. Electronically Signed   By: Lajean Manes M.D.   On: 07/31/2015 21:22   I have personally reviewed and evaluated these images and lab results as part of my medical decision-making.   EKG Interpretation  Date/Time:  Sunday July 31 2015 21:46:20 EDT Ventricular Rate:  71 PR Interval:  148 QRS Duration: 74 QT Interval:  416 QTC Calculation: 452 R Axis:   52 Text Interpretation:  Normal sinus rhythm Normal ECG No significant change  was found Confirmed by Wyvonnia Dusky  MD, Gordan Grell [phone]) on 07/31/2015 9:50:41  PM      MDM   Final diagnoses:  Rib pain on right side   Right sided chest and axilla pain after doing aerobics and working out. No chest pain or shortness of breath. No cough or fever.  Tenderness to palpation of the right lateral ribs, no rash.  X-rays negative for fracture or pneumothorax. D-dimer negative.  Suspect musculoskeletal pain likely strain. No evidence of ACS or PE. Also consider zoster without rash visible. Patient taking anti-inflammatories which she has at home. Follow-up with PCP. Return precautions discussed.  I personally performed the services described in this documentation, which was scribed in my presence. The recorded information has been reviewed and is accurate.   Ezequiel Essex, MD 07/31/15 [phone]

## 2015-08-04 ENCOUNTER — Ambulatory Visit (INDEPENDENT_AMBULATORY_CARE_PROVIDER_SITE_OTHER): Payer: BC Managed Care – PPO | Admitting: Nurse Practitioner

## 2015-08-04 ENCOUNTER — Encounter: Payer: Self-pay | Admitting: Nurse Practitioner

## 2015-08-04 VITALS — BP 118/70 | HR 89 | Temp 98.7°F | Resp 16 | Ht 60.0 in | Wt 224.0 lb

## 2015-08-04 DIAGNOSIS — T148XXA Other injury of unspecified body region, initial encounter: Secondary | ICD-10-CM

## 2015-08-04 DIAGNOSIS — T148 Other injury of unspecified body region: Secondary | ICD-10-CM

## 2015-08-04 NOTE — Progress Notes (Signed)
Patient ID: Mackenzie Weber, female   DOB: 1961-06-20, 54 y.o.   MRN: [id number]    PCP: Gildardo Cranker, MD  Advanced Directive information Does patient have an advance directive?: No, Would patient like information on creating an advanced directive?: No - patient declined information  Allergies  Allergen Reactions  . Cortisone     Red area around injection site x 63mth  . Depo-Medrol [Methylprednisolone Acetate] Nausea Only and Other (See Comments)    Dizziness  . Aspirin Anxiety and Other (See Comments)    Rapid Heart beat    Chief Complaint  Patient presents with  . Acute Visit    Pain under right arm x 1 week, ? pulled muscle      HPI: Patient is a 54 y.o. female seen in the office today to follow up pain under arm. 1 week ago was leaving water aerobics and pain under arm started. Possible gas and took medication tired things but did not help. Pain made her short of breath and pain under right breast. Went to ED. Xray was negative, tenderness to palpation. EKG and blood work negative. Was diagnosed with muscle strain. Pt also noted she had been working with personal trainer, hanging curtains and doing other thing around the house and in exercise all around the time pain started. Taking meloxicam 7.5 mg daily and robaxin 750 mg every 8 hours.  Muscles relaxer helps some, having side effects of being sleepy therefore she took 1/2 tablet which helped.  Was using heat which did not help so she stopped.  Pain is at 7/10, medication helps to bring it down to a 5 which is only when she is not moving. Moving makes it worse.   Review of Systems:  Review of Systems  Constitutional: Negative for fever, activity change and appetite change.  Respiratory: Positive for shortness of breath.        Shortness of breath with pain with exertion, laughing or singing.   Cardiovascular: Negative for chest pain.  Musculoskeletal: Positive for arthralgias.       Knee pain and side discomfort  Skin:  Negative for rash and wound.  Neurological: Negative for numbness.    Past Medical History  Diagnosis Date  . Hypertension   . Asthma   . Seasonal allergies   . IBS (irritable bowel syndrome)   . Sciatica of left side   . Foot pain, right   . Anemia    Past Surgical History  Procedure Laterality Date  . Total abdominal hysterectomy w/ bilateral salpingoophorectomy  1989    LSO-1987; RsO-1998  . Knee surgery      right  . Appendectomy    . Cholecystectomy    . Tonsillectomy    . Wrist surgery      carpal tunnel repair  . Breast surgery  2001    /biopsy-benign  . Small bowel surgery    . Excisional hemorrhoidectomy    . Bladder suspension  2012    TVT  . Abdominal hysterectomy     Social History:   reports that she has never smoked. She has never used smokeless tobacco. She reports that she does not drink alcohol or use illicit drugs.  Family History  Problem Relation Age of Onset  . Diabetes Father   . Hypertension Father   . Diabetes Mother   . Hypertension Mother   . Asthma Mother   . Diabetes Brother   . Hypertension Sister   . Diabetes Sister   . Cancer Sister  68    breast  . Hypertension Paternal Uncle     Medications: Patient's Medications  New Prescriptions   No medications on file  Previous Medications   ALBUTEROL (PROVENTIL HFA;VENTOLIN HFA) 108 (90 BASE) MCG/ACT INHALER    Inhale 2 puffs into the lungs every 6 (six) hours as needed for wheezing or shortness of breath.   BUDESONIDE-FORMOTEROL (SYMBICORT) 80-4.5 MCG/ACT INHALER    Take 2 puffs first thing in am and then another 2 puffs about 12 hours later.   EPINEPHRINE (EPIPEN JR) 0.15 MG/0.3ML INJECTION    Inject 0.15 mg into the muscle daily as needed for anaphylaxis.    ESTRADIOL (ESTRACE) 0.1 MG/GM VAGINAL CREAM    Place 0.34 Applicatorfuls vaginally daily.   LOSARTAN-HYDROCHLOROTHIAZIDE (HYZAAR) 100-12.5 MG PER TABLET    Take 1 tablet by mouth daily.   MELOXICAM (MOBIC) 7.5 MG TABLET    Take  1 tablet (7.5 mg total) by mouth daily. May take up to 2 tabs po daily prn severe pain   METHOCARBAMOL (ROBAXIN) 750 MG TABLET    Take 750 mg by mouth every 8 (eight) hours as needed for muscle spasms.   MULTIPLE VITAMIN (MULTIVITAMIN) CAPSULE    Take 1 capsule by mouth daily.   TRIAMTERENE-HYDROCHLOROTHIAZIDE (MAXZIDE) 75-50 MG PER TABLET    Take 0.5 tablets by mouth daily.   Modified Medications   No medications on file  Discontinued Medications   No medications on file     Physical Exam:  Filed Vitals:   08/04/15 1458  BP: 118/70  Pulse: 89  Temp: 98.7 F (37.1 C)  TempSrc: Oral  Resp: 16  Height: 5' (1.524 m)  Weight: 224 lb (101.606 kg)  SpO2: 97%   Body mass index is 43.75 kg/(m^2).  Physical Exam  Constitutional: She is oriented to person, place, and time. She appears well-developed and well-nourished. No distress.  HENT:  Head: Normocephalic and atraumatic.  Cardiovascular: Normal rate, regular rhythm and normal heart sounds.   Pulmonary/Chest: Effort normal and breath sounds normal.  Musculoskeletal:       Arms: Neurological: She is alert and oriented to person, place, and time.  Skin: Skin is warm and dry. She is not diaphoretic.  Psychiatric: She has a normal mood and affect.   Labs reviewed: Basic Metabolic Panel:  Recent Labs  05/25/15 1414 07/31/15 2135  NA 142 141  K 3.9 3.3*  CL 101 106  CO2 27 28  GLUCOSE 116* 113*  BUN 14 17  CREATININE 0.88 0.88  CALCIUM 9.7 9.9  MG 2.2  --    Liver Function Tests:  Recent Labs  05/25/15 1414  AST 15  ALT 13  ALKPHOS 75  BILITOT <0.2  PROT 6.8  ALBUMIN 4.1   No results for input(s): LIPASE, AMYLASE in the last 8760 hours. No results for input(s): AMMONIA in the last 8760 hours. CBC:  Recent Labs  05/25/15 1414 07/31/15 2135  WBC 6.6 6.8  NEUTROABS 3.4 3.5  HGB  --  10.7*  HCT 34.3 33.9*  MCV  --  63.5*  PLT  --  351   Lipid Panel: No results for input(s): CHOL, HDL, LDLCALC, TRIG,  CHOLHDL, LDLDIRECT in the last 8760 hours. TSH: No results for input(s): TSH in the last 8760 hours. A1C: No results found for: HGBA1C   Assessment/Plan 1. Muscle strain After work-out, aerobic training, extesive house work. Work up in ED negative for acute finding.  -restrict activity  To Use meloxicam twice daily for  next 5 days then daily  -to cont to use robaxin- use nightly and during the day as tolerated  -to use ice to affected area 2-3 times daily -May use muscle rub after ice   Follow up precautions discussed  Janett Billow K. Harle Battiest  [facility] [phone] 8 am - 5 pm) [phone] (after hours)

## 2015-08-04 NOTE — Patient Instructions (Signed)
Use meloxicam twice daily for next 5 days then daily  Cont to use robaxin- use nightly and during the day as tolerated  Use ice to affected area 2-3 times daily May use muscle rub after ice    Muscle Strain A muscle strain is an injury that occurs when a muscle is stretched beyond its normal length. Usually a small number of muscle fibers are torn when this happens. Muscle strain is rated in degrees. First-degree strains have the least amount of muscle fiber tearing and pain. Second-degree and third-degree strains have increasingly more tearing and pain.  Usually, recovery from muscle strain takes 1-2 weeks. Complete healing takes 5-6 weeks.  CAUSES  Muscle strain happens when a sudden, violent force placed on a muscle stretches it too far. This may occur with lifting, sports, or a fall.  RISK FACTORS Muscle strain is especially common in athletes.  SIGNS AND SYMPTOMS At the site of the muscle strain, there may be:  Pain.  Bruising.  Swelling.  Difficulty using the muscle due to pain or lack of normal function. DIAGNOSIS  Your health care provider will perform a physical exam and ask about your medical history. TREATMENT  Often, the best treatment for a muscle strain is resting, icing, and applying cold compresses to the injured area.  HOME CARE INSTRUCTIONS   Use the PRICE method of treatment to promote muscle healing during the first 2-3 days after your injury. The PRICE method involves:  Protecting the muscle from being injured again.  Restricting your activity and resting the injured body part.  Icing your injury. To do this, put ice in a plastic bag. Place a towel between your skin and the bag. Then, apply the ice and leave it on from 15-20 minutes each hour. After the third day, switch to moist heat packs.  Apply compression to the injured area with a splint or elastic bandage. Be careful not to wrap it too tightly. This may interfere with blood circulation or increase  swelling.  Elevate the injured body part above the level of your heart as often as you can.  Only take over-the-counter or prescription medicines for pain, discomfort, or fever as directed by your health care provider.  Warming up prior to exercise helps to prevent future muscle strains. SEEK MEDICAL CARE IF:   You have increasing pain or swelling in the injured area.  You have numbness, tingling, or a significant loss of strength in the injured area. MAKE SURE YOU:   Understand these instructions.  Will watch your condition.  Will get help right away if you are not doing well or get worse.   This information is not intended to replace advice given to you by your health care provider. Make sure you discuss any questions you have with your health care provider.   Document Released: 10/01/2005 Document Revised: 07/22/2013 Document Reviewed: 04/30/2013 Elsevier Interactive Patient Education [employer].

## 2015-08-24 ENCOUNTER — Ambulatory Visit (INDEPENDENT_AMBULATORY_CARE_PROVIDER_SITE_OTHER): Payer: BC Managed Care – PPO | Admitting: Internal Medicine

## 2015-08-24 ENCOUNTER — Encounter: Payer: Self-pay | Admitting: Internal Medicine

## 2015-08-24 VITALS — BP 118/76 | HR 77 | Temp 98.2°F | Resp 20 | Ht 60.0 in | Wt 223.4 lb

## 2015-08-24 DIAGNOSIS — K648 Other hemorrhoids: Secondary | ICD-10-CM | POA: Diagnosis not present

## 2015-08-24 DIAGNOSIS — M5431 Sciatica, right side: Secondary | ICD-10-CM | POA: Diagnosis not present

## 2015-08-24 DIAGNOSIS — M9906 Segmental and somatic dysfunction of lower extremity: Secondary | ICD-10-CM | POA: Diagnosis not present

## 2015-08-24 DIAGNOSIS — M9905 Segmental and somatic dysfunction of pelvic region: Secondary | ICD-10-CM

## 2015-08-24 DIAGNOSIS — M9901 Segmental and somatic dysfunction of cervical region: Secondary | ICD-10-CM

## 2015-08-24 DIAGNOSIS — M9908 Segmental and somatic dysfunction of rib cage: Secondary | ICD-10-CM

## 2015-08-24 DIAGNOSIS — M9904 Segmental and somatic dysfunction of sacral region: Secondary | ICD-10-CM

## 2015-08-24 DIAGNOSIS — M9903 Segmental and somatic dysfunction of lumbar region: Secondary | ICD-10-CM

## 2015-08-24 DIAGNOSIS — R0781 Pleurodynia: Secondary | ICD-10-CM | POA: Diagnosis not present

## 2015-08-24 MED ORDER — HYDROCORTISONE ACE-PRAMOXINE 1-1 % RE FOAM: 1.0000 | Freq: Every day | RECTAL | Status: DC

## 2015-08-24 NOTE — Progress Notes (Signed)
Patient ID: Mackenzie Weber, female   DOB: 10-14-1961, 54 y.o.   MRN: [id number]    Location:    [facility]   Place of Service:  OFFICE   Chief Complaint  Patient presents with  . OTHER    OMM    HPI:  54 yo female seen today for f/u sciatica and LBP.  She c/o rectal pain since colonic cleanser attempted a few weeks ago. She experienced pain when the tube was inserted and procedure had to be stopped. Yesterday she felt "like something was stuck in there".  No BRBPR. No significant constipation.   Back pain improving. No sciatica. She still has left knee pain and swelling. She was seen in the ER mid Oct for muscle strain/sprain under right breast after leaving water aerobics. Cardiac w/u neg.  Past Medical History  Diagnosis Date  . Hypertension   . Asthma   . Seasonal allergies   . IBS (irritable bowel syndrome)   . Sciatica of left side   . Foot pain, right   . Anemia     Past Surgical History  Procedure Laterality Date  . Total abdominal hysterectomy w/ bilateral salpingoophorectomy  1989    LSO-1987; RsO-1998  . Knee surgery      right  . Appendectomy    . Cholecystectomy    . Tonsillectomy    . Wrist surgery      carpal tunnel repair  . Breast surgery  2001    /biopsy-benign  . Small bowel surgery    . Excisional hemorrhoidectomy    . Bladder suspension  2012    TVT  . Abdominal hysterectomy      Patient Care Team: Gildardo Cranker, MD as PCP - General  Social History   Social History  . Marital Status: Married    Spouse Name: N/A  . Number of Children: N/A  . Years of Education: N/A   Occupational History  . retired in 2015 from [employer].    Social History Main Topics  . Smoking status: Never Smoker   . Smokeless tobacco: Never Used  . Alcohol Use: No  . Drug Use: No  . Sexual Activity:    Partners: Male    Birth Control/ Protection: Surgical   Other Topics Concern  . Not on file   Social History Narrative     reports that she has  never smoked. She has never used smokeless tobacco. She reports that she does not drink alcohol or use illicit drugs.  Allergies  Allergen Reactions  . Cortisone     Red area around injection site x 11mh  . Depo-Medrol [Methylprednisolone Acetate] Nausea Only and Other (See Comments)    Dizziness  . Aspirin Anxiety and Other (See Comments)    Rapid Heart beat    Medications: Patient's Medications  New Prescriptions   No medications on file  Previous Medications   ALBUTEROL (PROVENTIL HFA;VENTOLIN HFA) 108 (90 BASE) MCG/ACT INHALER    Inhale 2 puffs into the lungs every 6 (six) hours as needed for wheezing or shortness of breath.   BUDESONIDE-FORMOTEROL (SYMBICORT) 80-4.5 MCG/ACT INHALER    Take 2 puffs first thing in am and then another 2 puffs about 12 hours later.   EPINEPHRINE (EPIPEN JR) 0.15 MG/0.3ML INJECTION    Inject 0.15 mg into the muscle daily as needed for anaphylaxis.    ESTRADIOL (ESTRACE) 0.1 MG/GM VAGINAL CREAM    Place 07.07Applicatorfuls vaginally daily.   LOSARTAN-HYDROCHLOROTHIAZIDE (HYZAAR)  100-12.5 MG PER TABLET    Take 1 tablet by mouth daily.   MELOXICAM (MOBIC) 7.5 MG TABLET    Take 1 tablet (7.5 mg total) by mouth daily. May take up to 2 tabs po daily prn severe pain   METHOCARBAMOL (ROBAXIN) 750 MG TABLET    Take 750 mg by mouth every 8 (eight) hours as needed for muscle spasms.   MULTIPLE VITAMIN (MULTIVITAMIN) CAPSULE    Take 1 capsule by mouth daily.   TRIAMTERENE-HYDROCHLOROTHIAZIDE (MAXZIDE) 75-50 MG PER TABLET    Take 0.5 tablets by mouth daily.   Modified Medications   No medications on file  Discontinued Medications   No medications on file    Review of Systems  Constitutional: Negative for fever, chills and fatigue.  Gastrointestinal: Positive for rectal pain. Negative for nausea, abdominal pain, blood in stool and anal bleeding.  Musculoskeletal: Positive for back pain, arthralgias and gait problem.  Skin: Negative for rash.    Filed Vitals:    08/24/15 1309  Pulse: 77  Temp: 98.2 F (36.8 C)  TempSrc: Oral  Resp: 20  Height: 5' (1.524 m)  Weight: 223 lb 6.4 oz (101.334 kg)  SpO2: 98%   Body mass index is 43.63 kg/(m^2).  Physical Exam  Constitutional: She is oriented to person, place, and time. She appears well-developed and well-nourished. No distress.  Genitourinary: Rectal exam shows internal hemorrhoid and tenderness. Rectal exam shows no external hemorrhoid, no fissure, no mass and anal tone normal.  Small internal hemorrhoid present, TTP. No active bleed  Musculoskeletal: She exhibits edema and tenderness.  (+) right standing flexion test; left short leg; sacral torsion; right pelvic inflare with SI Joint restriction and ASIS TTP R>L; paravertebral lumbar and thoracic muscle hypertrophy with ropy tissue texture changes; increased lumbar lordosis; distal +1 pitting lower extremity edema; gait antalgic; right lateral rib 6-9 stuck down and TTP; left popliteal fossa hypertrophy and TTP  Neurological: She is alert and oriented to person, place, and time.  Skin: Skin is warm and dry. No rash noted.     Psychiatric: She has a normal mood and affect. Her behavior is normal. Thought content normal.     Labs reviewed: Admission on 07/31/2015, Discharged on 07/31/2015  Component Date Value Ref Range Status  . WBC 07/31/2015 6.8  4.0 - 10.5 K/uL Final  . RBC 07/31/2015 5.34* 3.87 - 5.11 MIL/uL Final  . Hemoglobin 07/31/2015 10.7* 12.0 - 15.0 g/dL Final  . HCT 07/31/2015 33.9* 36.0 - 46.0 % Final  . MCV 07/31/2015 63.5* 78.0 - 100.0 fL Final  . MCH 07/31/2015 20.0* 26.0 - 34.0 pg Final  . MCHC 07/31/2015 31.6  30.0 - 36.0 g/dL Final  . RDW 07/31/2015 17.4* 11.5 - 15.5 % Final  . Platelets 07/31/2015 351  150 - 400 K/uL Final  . Neutrophils Relative % 07/31/2015 51   Final  . Lymphocytes Relative 07/31/2015 39   Final  . Monocytes Relative 07/31/2015 8   Final  . Eosinophils Relative 07/31/2015 2   Final  . Basophils  Relative 07/31/2015 0   Final  . Neutro Abs 07/31/2015 3.5  1.7 - 7.7 K/uL Final  . Lymphs Abs 07/31/2015 2.7  0.7 - 4.0 K/uL Final  . Monocytes Absolute 07/31/2015 0.5  0.1 - 1.0 K/uL Final  . Eosinophils Absolute 07/31/2015 0.1  0.0 - 0.7 K/uL Final  . Basophils Absolute 07/31/2015 0.0  0.0 - 0.1 K/uL Final  . RBC Morphology 07/31/2015 TARGET CELLS   Final  Comment: POLYCHROMASIA PRESENT ELLIPTOCYTES   . Sodium 07/31/2015 141  135 - 145 mmol/L Final  . Potassium 07/31/2015 3.3* 3.5 - 5.1 mmol/L Final  . Chloride 07/31/2015 106  101 - 111 mmol/L Final  . CO2 07/31/2015 28  22 - 32 mmol/L Final  . Glucose, Bld 07/31/2015 113* 65 - 99 mg/dL Final  . BUN 07/31/2015 17  6 - 20 mg/dL Final  . Creatinine, Ser 07/31/2015 0.88  0.44 - 1.00 mg/dL Final  . Calcium 07/31/2015 9.9  8.9 - 10.3 mg/dL Final  . GFR calc non Af Amer 07/31/2015 >60  >60 mL/min Final  . GFR calc Af Amer 07/31/2015 >60  >60 mL/min Final   Comment: (NOTE) The eGFR has been calculated using the CKD EPI equation. This calculation has not been validated in all clinical situations. eGFR's persistently <60 mL/min signify possible Chronic Kidney Disease.   . Anion gap 07/31/2015 7  5 - 15 Final  . Troponin I 07/31/2015 <0.03  <0.031 ng/mL Final   Comment:        NO INDICATION OF MYOCARDIAL INJURY.   Marland Kitchen D-Dimer, Quant 07/31/2015 <0.27  0.00 - 0.48 ug/mL-FEU Final   Comment:        AT THE INHOUSE ESTABLISHED CUTOFF VALUE OF 0.48 ug/mL FEU, THIS ASSAY HAS BEEN DOCUMENTED IN THE LITERATURE TO HAVE A SENSITIVITY AND NEGATIVE PREDICTIVE VALUE OF AT LEAST 98 TO 99%.  THE TEST RESULT SHOULD BE CORRELATED WITH AN ASSESSMENT OF THE CLINICAL PROBABILITY OF DVT / VTE.   Office Visit on 05/25/2015  Component Date Value Ref Range Status  . Glucose 05/25/2015 116* 65 - 99 mg/dL Final  . BUN 05/25/2015 14  6 - 24 mg/dL Final  . Creatinine, Ser 05/25/2015 0.88  0.57 - 1.00 mg/dL Final  . GFR calc non Af Amer 05/25/2015 75   >59 mL/min/1.73 Final  . GFR calc Af Amer 05/25/2015 87  >59 mL/min/1.73 Final  . BUN/Creatinine Ratio 05/25/2015 16  9 - 23 Final  . Sodium 05/25/2015 142  134 - 144 mmol/L Final  . Potassium 05/25/2015 3.9  3.5 - 5.2 mmol/L Final  . Chloride 05/25/2015 101  97 - 108 mmol/L Final  . CO2 05/25/2015 27  18 - 29 mmol/L Final  . Calcium 05/25/2015 9.7  8.7 - 10.2 mg/dL Final  . Total Protein 05/25/2015 6.8  6.0 - 8.5 g/dL Final  . Albumin 05/25/2015 4.1  3.5 - 5.5 g/dL Final  . Globulin, Total 05/25/2015 2.7  1.5 - 4.5 g/dL Final  . Albumin/Globulin Ratio 05/25/2015 1.5  1.1 - 2.5 Final  . Bilirubin Total 05/25/2015 <0.2  0.0 - 1.2 mg/dL Final  . Alkaline Phosphatase 05/25/2015 75  39 - 117 IU/L Final  . AST 05/25/2015 15  0 - 40 IU/L Final  . ALT 05/25/2015 13  0 - 32 IU/L Final  . WBC 05/25/2015 6.6  3.4 - 10.8 x10E3/uL Final  . RBC 05/25/2015 5.18  3.77 - 5.28 x10E6/uL Final  . Hemoglobin 05/25/2015 10.2* 11.1 - 15.9 g/dL Final  . Hematocrit 05/25/2015 34.3  34.0 - 46.6 % Final  . MCV 05/25/2015 66* 79 - 97 fL Final  . MCH 05/25/2015 19.7* 26.6 - 33.0 pg Final  . MCHC 05/25/2015 29.7* 31.5 - 35.7 g/dL Final  . RDW 05/25/2015 17.9* 12.3 - 15.4 % Final  . Platelets 05/25/2015 349  150 - 379 x10E3/uL Final  . Neutrophils 05/25/2015 51   Final  . Lymphs 05/25/2015 40  Final  . Monocytes 05/25/2015 6   Final  . Eos 05/25/2015 2   Final  . Wendi Snipes 05/25/2015 1   Final  . Neutrophils Absolute 05/25/2015 3.4  1.4 - 7.0 x10E3/uL Final  . Lymphocytes Absolute 05/25/2015 2.7  0.7 - 3.1 x10E3/uL Final  . Monocytes Absolute 05/25/2015 0.4  0.1 - 0.9 x10E3/uL Final  . EOS (ABSOLUTE) 05/25/2015 0.2  0.0 - 0.4 x10E3/uL Final  . Basophils Absolute 05/25/2015 0.0  0.0 - 0.2 x10E3/uL Final  . Immature Granulocytes 05/25/2015 0   Final  . Immature Grans (Abs) 05/25/2015 0.0  0.0 - 0.1 x10E3/uL Final  . Magnesium 05/25/2015 2.2  1.6 - 2.3 mg/dL Final   Comment: **Effective June 06, 2015 the  reference interval**   for Magnesium, Serum will be changing to:                             0 - 30 days     1.6 - 2.4                             1 -  6 months   1.8 - 2.5                      7 months -  1 year     1.7 - 2.4                             2 -  5 years    1.6 - 2.3                             6 - 17 years    1.7 - 2.3                                >17 years    1.6 - 2.3   . Total Iron Binding Capacity 05/25/2015 267  250 - 450 ug/dL Final  . UIBC 05/25/2015 229  131 - 425 ug/dL Final  . Iron 05/25/2015 38  27 - 159 ug/dL Final  . Iron Saturation 05/25/2015 14* 15 - 55 % Final  . Ferritin 05/25/2015 28  15 - 150 ng/mL Final  . specimen status report 05/25/2015 Comment   Final   Comment: Written Authorization Written Authorization Written Authorization Received. Authorization received from [place] 05-27-2015 Logged by Urban Gibson     Dg Chest 2 View  07/31/2015  CLINICAL DATA:  Right-sided chest pain for 4 days. Pain with deep breathing and movement. EXAM: CHEST  2 VIEW COMPARISON:  08/26/2014 FINDINGS: Cardiac silhouette is borderline enlarged. Normal mediastinal and hilar contours. Clear lungs. No pleural effusion or pneumothorax. Bony thorax is intact. IMPRESSION: No active cardiopulmonary disease. Electronically Signed   By: Lajean Manes M.D.   On: 07/31/2015 21:22     Assessment/Plan   ICD-9-CM ICD-10-CM   1. Internal hemorrhoids without complication 854.6 E70.3 hydrocortisone-pramoxine (PROCTOFOAM HC) rectal foam  2. Rib pain on right side 786.50 R07.81   3. Chronic sciatica, right 724.3 M54.31   4. Somatic dysfunction of pelvic region 739.5 M99.05   5. Somatic dysfunction of lumbar region 739.3 M99.03   6. Somatic dysfunction of sacral region 739.4 M99.04  7. Somatic dysfunction of lower extremity 739.6 M99.06   8. Somatic dysfunction of rib 739.8 M99.08   9. Somatic dysfunction of cervical region 739.1 M99.01     PROCEDURE NOTE:  After verbal  consent obtained, OMT utilized with improvement in ROM, tissue texture, asymmetry and tenderness. Pt tolerated procedure well.  OMT Treatment 08/24/2015 07/20/2015 06/22/2015 05/25/2015 05/11/2015 05/11/2015 04/13/2015  Head/Face - - - CR;MFR;ST;IND/INR;ME CR;MFR;ST;IND/INR;ME CR;MFR;ST;IND/INR -  Head/Face Response - - - I I I -  Neck  - CS;IND/INR;MFR;ST;ME CS;IND/INR;MFR;ST CS;IND/INR;MFR;ST CS;IND/INR;MFR;ST;ME CS;IND/INR;MFR;ST;ME -  Neck Response - I I I I I -  T-5-T9  LAS;MFR;ST;IND/INR - - - - - -  T5-T9 Response I - - - - - -  Ribs  IND/INR;LAS;MFR;ST - - - - - -  Ribs Response I - - - - - -  Lumbar IND/INR;LAS;MFR;ST IND/INR;LAS;MFR;ST IND/INR;LAS;MFR;ST IND/INR;LAS;MFR;ST IND/INR;LAS;MFR;ST IND/INR;LAS;MFR;ST IND/INR;LAS;MFR;ST  Lumbar Response I I I I I I I  Sacrum IND/INR;MFR;ST IND/INR;MFR;ST IND/INR;MFR;ST IND/INR;MFR;ST IND/INR;MFR;ST IND/INR;MFR;ST IND/INR;MFR;ST  Sacrum Response I I I I I I I  Pelvis LAS;MFR;ST;IND/INR LAS;MFR;ST;IND/INR;ME LAS;MFR;ST;IND/INR;ME LAS;MFR;ST;IND/INR LAS;MFR;ST;IND/INR LAS;MFR;ST;IND/INR LAS;MFR;ST;IND/INR;ME  Pelvis Response I I I I I I I  Lower Ext. LAS;MFR;ST;IND/INR;DIR LAS;MFR;ST;IND/INR;DIR LAS;MFR;ST;IND/INR;DIR LAS;MFR;ST;IND/INR;DIR LAS;MFR;ST;IND/INR;DIR LAS;MFR;ST;IND/INR;DIR LAS;MFR;ST;IND/INR;DIR  Lower Ext. Response I I I I I I I     Push fluids and rest today. No heavy lifting. May resume nml activity in the AM.  Follow up in 4 weeks for OMM and as needed  Turner. Perlie Gold  [facility] [address] [place], [place] [address] [phone] Cell (Monday-Friday 8 AM - 5 PM) [phone] After 5 PM and follow prompts

## 2015-08-24 NOTE — Patient Instructions (Signed)
Push fluids and rest today. No heavy lifting. May resume nml activity in the AM.  Follow up in 4 weeks for OMM and as needed

## 2015-09-15 ENCOUNTER — Other Ambulatory Visit: Payer: Self-pay | Admitting: Internal Medicine

## 2015-09-15 MED ORDER — CEFUROXIME AXETIL 500 MG PO TABS: 500.0000 mg | ORAL_TABLET | Freq: Two times a day (BID) | ORAL | Status: DC

## 2015-09-16 ENCOUNTER — Ambulatory Visit (INDEPENDENT_AMBULATORY_CARE_PROVIDER_SITE_OTHER): Payer: BC Managed Care – PPO | Admitting: Internal Medicine

## 2015-09-16 ENCOUNTER — Encounter: Payer: Self-pay | Admitting: Internal Medicine

## 2015-09-16 VITALS — BP 116/76 | HR 73 | Temp 98.0°F | Resp 18 | Ht 60.0 in | Wt 226.0 lb

## 2015-09-16 DIAGNOSIS — M5431 Sciatica, right side: Secondary | ICD-10-CM | POA: Diagnosis not present

## 2015-09-16 DIAGNOSIS — J4530 Mild persistent asthma, uncomplicated: Secondary | ICD-10-CM | POA: Diagnosis not present

## 2015-09-16 DIAGNOSIS — M9901 Segmental and somatic dysfunction of cervical region: Secondary | ICD-10-CM | POA: Diagnosis not present

## 2015-09-16 DIAGNOSIS — M9900 Segmental and somatic dysfunction of head region: Secondary | ICD-10-CM | POA: Diagnosis not present

## 2015-09-16 DIAGNOSIS — I10 Essential (primary) hypertension: Secondary | ICD-10-CM | POA: Diagnosis not present

## 2015-09-16 DIAGNOSIS — J0100 Acute maxillary sinusitis, unspecified: Secondary | ICD-10-CM | POA: Diagnosis not present

## 2015-09-16 DIAGNOSIS — M9903 Segmental and somatic dysfunction of lumbar region: Secondary | ICD-10-CM

## 2015-09-16 DIAGNOSIS — M9905 Segmental and somatic dysfunction of pelvic region: Secondary | ICD-10-CM | POA: Diagnosis not present

## 2015-09-16 DIAGNOSIS — M9904 Segmental and somatic dysfunction of sacral region: Secondary | ICD-10-CM | POA: Diagnosis not present

## 2015-09-16 DIAGNOSIS — M9906 Segmental and somatic dysfunction of lower extremity: Secondary | ICD-10-CM | POA: Diagnosis not present

## 2015-09-16 NOTE — Patient Instructions (Signed)
Push fluids and rest today. No heavy lifting. May resume nml activity in the AM.  STOP DOXYCYCLINE  START CEFTIN (CEFAROXIME)  Take probiotic with antibiotic daily  Continue other medications as ordered  Follow up in 4 weeks for OMM

## 2015-09-16 NOTE — Progress Notes (Signed)
Patient ID: Mackenzie Weber, female   DOB: 03/21/1961, 54 y.o.   MRN: [id number]    Location:    [facility]   Place of Service:   OFFICE  Chief Complaint  Patient presents with  . Medical Management of Chronic Issues    OMT    HPI:  54 yo female seen today for f/u sciatica and LBP. Back pain improving. No sciatica. She still has left knee pain and swelling. She was seen in the ER mid Oct for muscle strain/sprain under right breast after leaving water aerobics. Cardiac w/u neg. Her pain is 90% better  She was seen bu ENT for URI due to sinus drainage. She was Rx doxy and prednisone. She reports feeling better. She has not picked up ceftin yet. Cough improved and voice is less deep. No dizziness but has slight HA.  BP controlled on hyzaar and maxzide  She uses HRT due postmenopausal sx's  Asthma stable on symbicort and prn HFA   Past Medical History  Diagnosis Date  . Hypertension   . Asthma   . Seasonal allergies   . IBS (irritable bowel syndrome)   . Sciatica of left side   . Foot pain, right   . Anemia     Past Surgical History  Procedure Laterality Date  . Total abdominal hysterectomy w/ bilateral salpingoophorectomy  1989    LSO-1987; RsO-1998  . Knee surgery      right  . Appendectomy    . Cholecystectomy    . Tonsillectomy    . Wrist surgery      carpal tunnel repair  . Breast surgery  2001    /biopsy-benign  . Small bowel surgery    . Excisional hemorrhoidectomy    . Bladder suspension  2012    TVT  . Abdominal hysterectomy      Patient Care Team: Monica Carter, MD as PCP - General  Social History   Social History  . Marital Status: Married    Spouse Name: N/A  . Number of Children: N/A  . Years of Education: N/A   Occupational History  . retired in 2015 from [employer].    Social History Main Topics  . Smoking status: Never Smoker   . Smokeless tobacco: Never Used  . Alcohol Use: No  . Drug Use: No  . Sexual Activity:    Partners: Male      Birth Control/ Protection: Surgical   Other Topics Concern  . Not on file   Social History Narrative     reports that she has never smoked. She has never used smokeless tobacco. She reports that she does not drink alcohol or use illicit drugs.  Allergies  Allergen Reactions  . Cortisone     Red area around injection site x 1mth  . Depo-Medrol [Methylprednisolone Acetate] Nausea Only and Other (See Comments)    Dizziness  . Aspirin Anxiety and Other (See Comments)    Rapid Heart beat    Medications: Patient's Medications  New Prescriptions   No medications on file  Previous Medications   ALBUTEROL (PROVENTIL HFA;VENTOLIN HFA) 108 (90 BASE) MCG/ACT INHALER    Inhale 2 puffs into the lungs every 6 (six) hours as needed for wheezing or shortness of breath.   BUDESONIDE-FORMOTEROL (SYMBICORT) 80-4.5 MCG/ACT INHALER    Take 2 puffs first thing in am and then another 2 puffs about 12 hours later.   CEFUROXIME (CEFTIN) 500 MG TABLET    Take 1   tablet (500 mg total) by mouth 2 (two) times daily with a meal.   EPINEPHRINE (EPIPEN JR) 0.15 MG/0.3ML INJECTION    Inject 0.15 mg into the muscle daily as needed for anaphylaxis.    ESTRADIOL (ESTRACE) 0.1 MG/GM VAGINAL CREAM    Place 4.70 Applicatorfuls vaginally daily.   HYDROCORTISONE-PRAMOXINE (PROCTOFOAM HC) RECTAL FOAM    Place 1 applicator rectally at bedtime.   LOSARTAN-HYDROCHLOROTHIAZIDE (HYZAAR) 100-12.5 MG PER TABLET    Take 1 tablet by mouth daily.   MELOXICAM (MOBIC) 7.5 MG TABLET    Take 1 tablet (7.5 mg total) by mouth daily. May take up to 2 tabs po daily prn severe pain   METHOCARBAMOL (ROBAXIN) 750 MG TABLET    Take 750 mg by mouth every 8 (eight) hours as needed for muscle spasms.   MULTIPLE VITAMIN (MULTIVITAMIN) CAPSULE    Take 1 capsule by mouth daily.   TRIAMTERENE-HYDROCHLOROTHIAZIDE (MAXZIDE) 75-50 MG PER TABLET    Take 0.5 tablets by mouth daily.   Modified Medications   No medications on file  Discontinued  Medications   No medications on file    Review of Systems  Constitutional: Negative for fever, chills, diaphoresis, activity change, appetite change and fatigue.  HENT: Positive for congestion, postnasal drip, sinus pressure, sore throat and voice change. Negative for ear pain.   Eyes: Negative for visual disturbance.  Respiratory: Positive for cough. Negative for chest tightness and shortness of breath.   Cardiovascular: Negative for chest pain, palpitations and leg swelling.  Gastrointestinal: Negative for nausea, vomiting, abdominal pain, diarrhea, constipation and blood in stool.  Genitourinary: Negative for dysuria.  Musculoskeletal: Positive for arthralgias.  Neurological: Negative for dizziness, tremors, numbness and headaches.  Psychiatric/Behavioral: Positive for sleep disturbance. The patient is not nervous/anxious.     Filed Vitals:   09/16/15 1302  BP: 116/76  Pulse: 73  Temp: 98 F (36.7 C)  TempSrc: Oral  Resp: 18  Height: 5' (1.524 m)  Weight: 226 lb (102.513 kg)  SpO2: 98%   Body mass index is 44.14 kg/(m^2).  Physical Exam  Constitutional: She is oriented to person, place, and time. She appears well-developed and well-nourished.  Looks ill in NAD  HENT:  Mouth/Throat: No oropharyngeal exudate.  L>R maxillary sinus TTP with boggy tissue texture changes. Oropharynx cobblestoning and redness but no exudate  Eyes: Pupils are equal, round, and reactive to light. No scleral icterus.  Neck: Neck supple. Carotid bruit is not present. No tracheal deviation present.  Cardiovascular: Normal rate, regular rhythm, normal heart sounds and intact distal pulses.  Exam reveals no gallop and no friction rub.   No murmur heard. Trace b/l LE edema b/l. no calf TTP.   Pulmonary/Chest: Effort normal and breath sounds normal. No stridor. No respiratory distress. She has no wheezes. She has no rales.  Prolonged expiratory phase  Abdominal: Soft. Bowel sounds are normal. She  exhibits no distension and no mass. There is no hepatomegaly. There is no tenderness. There is no rebound and no guarding.  Musculoskeletal: She exhibits edema and tenderness.  Neg standing flexion test; no short leg; min left pelvic inflare with SI joint restriction; sacral torsion; increased lumbar lordosis; paravertebral lumbar, thoracic and cervical muscle hypertrophy with ropy tissue texture changes; left AC joint TTP; left 1st rib stuck up and TTP; OA extended; reduced cervical ROM; strength intact  Lymphadenopathy:    She has cervical adenopathy.  Neurological: She is alert and oriented to person, place, and time.  Skin: Skin is  warm and dry. No rash noted.     Psychiatric: She has a normal mood and affect. Her behavior is normal. Judgment and thought content normal.     Labs reviewed: Admission on 07/31/2015, Discharged on 07/31/2015  Component Date Value Ref Range Status  . WBC 07/31/2015 6.8  4.0 - 10.5 K/uL Final  . RBC 07/31/2015 5.34* 3.87 - 5.11 MIL/uL Final  . Hemoglobin 07/31/2015 10.7* 12.0 - 15.0 g/dL Final  . HCT 07/31/2015 33.9* 36.0 - 46.0 % Final  . MCV 07/31/2015 63.5* 78.0 - 100.0 fL Final  . MCH 07/31/2015 20.0* 26.0 - 34.0 pg Final  . MCHC 07/31/2015 31.6  30.0 - 36.0 g/dL Final  . RDW 07/31/2015 17.4* 11.5 - 15.5 % Final  . Platelets 07/31/2015 351  150 - 400 K/uL Final  . Neutrophils Relative % 07/31/2015 51   Final  . Lymphocytes Relative 07/31/2015 39   Final  . Monocytes Relative 07/31/2015 8   Final  . Eosinophils Relative 07/31/2015 2   Final  . Basophils Relative 07/31/2015 0   Final  . Neutro Abs 07/31/2015 3.5  1.7 - 7.7 K/uL Final  . Lymphs Abs 07/31/2015 2.7  0.7 - 4.0 K/uL Final  . Monocytes Absolute 07/31/2015 0.5  0.1 - 1.0 K/uL Final  . Eosinophils Absolute 07/31/2015 0.1  0.0 - 0.7 K/uL Final  . Basophils Absolute 07/31/2015 0.0  0.0 - 0.1 K/uL Final  . RBC Morphology 07/31/2015 TARGET CELLS   Final   Comment: POLYCHROMASIA  PRESENT ELLIPTOCYTES   . Sodium 07/31/2015 141  135 - 145 mmol/L Final  . Potassium 07/31/2015 3.3* 3.5 - 5.1 mmol/L Final  . Chloride 07/31/2015 106  101 - 111 mmol/L Final  . CO2 07/31/2015 28  22 - 32 mmol/L Final  . Glucose, Bld 07/31/2015 113* 65 - 99 mg/dL Final  . BUN 07/31/2015 17  6 - 20 mg/dL Final  . Creatinine, Ser 07/31/2015 0.88  0.44 - 1.00 mg/dL Final  . Calcium 07/31/2015 9.9  8.9 - 10.3 mg/dL Final  . GFR calc non Af Amer 07/31/2015 >60  >60 mL/min Final  . GFR calc Af Amer 07/31/2015 >60  >60 mL/min Final   Comment: (NOTE) The eGFR has been calculated using the CKD EPI equation. This calculation has not been validated in all clinical situations. eGFR's persistently <60 mL/min signify possible Chronic Kidney Disease.   . Anion gap 07/31/2015 7  5 - 15 Final  . Troponin I 07/31/2015 <0.03  <0.031 ng/mL Final   Comment:        NO INDICATION OF MYOCARDIAL INJURY.   . D-Dimer, Quant 07/31/2015 <0.27  0.00 - 0.48 ug/mL-FEU Final   Comment:        AT THE INHOUSE ESTABLISHED CUTOFF VALUE OF 0.48 ug/mL FEU, THIS ASSAY HAS BEEN DOCUMENTED IN THE LITERATURE TO HAVE A SENSITIVITY AND NEGATIVE PREDICTIVE VALUE OF AT LEAST 98 TO 99%.  THE TEST RESULT SHOULD BE CORRELATED WITH AN ASSESSMENT OF THE CLINICAL PROBABILITY OF DVT / VTE.     No results found.   Assessment/Plan   ICD-9-CM ICD-10-CM   1. Acute maxillary sinusitis, recurrence not specified 461.0 J01.00   2. Mild persistent asthma with allergic rhinitis  - stable 493.00 J45.30   3. Essential hypertension - stable 401.9 I10   4. Chronic sciatica, right - stable 724.3 M54.31   5. Somatic dysfunction of head region 739.0 M99.00   6. Somatic dysfunction of lumbar region 739.3 M99.03   7.   Somatic dysfunction of cervical region 739.1 M99.01   8. Somatic dysfunction of pelvic region 739.5 M99.05   9. Somatic dysfunction of lower extremity 739.6 M99.06   10. Somatic dysfunction of sacral region 739.4 M99.04      PROCEDURE NOTE:  After verbal consent obtained, OMT utilized with improvement in ROM, tissue texture, asymmetry and tenderness. Pt tolerated procedure well.  OMT Treatment 09/16/2015 08/24/2015 07/20/2015 06/22/2015 05/25/2015 05/11/2015 05/11/2015  Head/Face CR;MFR;ST;IND/INR - - - CR;MFR;ST;IND/INR;ME CR;MFR;ST;IND/INR;ME CR;MFR;ST;IND/INR  Head/Face Response I - - - I I I  Neck  CS;IND/INR;MFR;ST - CS;IND/INR;MFR;ST;ME CS;IND/INR;MFR;ST CS;IND/INR;MFR;ST CS;IND/INR;MFR;ST;ME CS;IND/INR;MFR;ST;ME  Neck Response I - _0   T-5-T9  - LAS;MFR;ST;IND/INR - - - - -  T5-T9 Response - I - - - - -  Ribs  - IND/INR;LAS;MFR;ST - - - - -  Ribs Response - I - - - - -  Lumbar IND/INR;LAS;MFR;ST IND/INR;LAS;MFR;ST IND/INR;LAS;MFR;ST IND/INR;LAS;MFR;ST IND/INR;LAS;MFR;ST IND/INR;LAS;MFR;ST IND/INR;LAS;MFR;ST  Lumbar Response _1  I I  Sacrum IND/INR;MFR;ST IND/INR;MFR;ST IND/INR;MFR;ST IND/INR;MFR;ST IND/INR;MFR;ST IND/INR;MFR;ST IND/INR;MFR;ST  Sacrum Response _2  I I  Pelvis LAS;MFR;ST;IND/INR LAS;MFR;ST;IND/INR LAS;MFR;ST;IND/INR;ME LAS;MFR;ST;IND/INR;ME LAS;MFR;ST;IND/INR LAS;MFR;ST;IND/INR LAS;MFR;ST;IND/INR  Pelvis Response _3  I I  Lower Ext. LAS;MFR;ST;IND/INR;DIR LAS;MFR;ST;IND/INR;DIR LAS;MFR;ST;IND/INR;DIR LAS;MFR;ST;IND/INR;DIR LAS;MFR;ST;IND/INR;DIR LAS;MFR;ST;IND/INR;DIR LAS;MFR;ST;IND/INR;DIR  Lower Ext. Response _4  I I     Push fluids and rest today. No heavy lifting. May resume nml activity in the AM.  STOP DOXYCYCLINE  START CEFTIN (CEFUROXIME) 511m BID x 10 days  Take probiotic with antibiotic daily  Continue other medications as ordered  Follow up in 4 weeks for OMM  MUnion Medical CenterS. CPerlie Gold PFirsthealth Moore [facility] Campusand [facility] [address] [place] [phone] (Monday-Friday 8 AM - 5 PM) [phone] 5 PM and follow prompts

## 2015-09-28 ENCOUNTER — Encounter: Payer: BC Managed Care – PPO | Admitting: Internal Medicine

## 2015-10-05 ENCOUNTER — Telehealth: Payer: Self-pay

## 2015-10-05 NOTE — Telephone Encounter (Signed)
No further recommendations at this time. Take meds as ordered. If no better, call back

## 2015-10-05 NOTE — Telephone Encounter (Signed)
Patient was seen at urgent care on Saturday 10/01/15. Patient was given Amoxicillin, prednisone, and a breathing treatment. Patient is still with productive cough (greenish/yellow) and congestion. Patient would like to know if Dr.Carter has any additional recommendations to help her recover from this upper respiratory infection.  Please advise

## 2015-10-05 NOTE — Telephone Encounter (Signed)
Discussed with patient, patient verbalized understanding of Dr.Carter's response  

## 2015-10-06 ENCOUNTER — Encounter (HOSPITAL_BASED_OUTPATIENT_CLINIC_OR_DEPARTMENT_OTHER): Payer: Self-pay

## 2015-10-06 ENCOUNTER — Emergency Department (HOSPITAL_BASED_OUTPATIENT_CLINIC_OR_DEPARTMENT_OTHER): Payer: BC Managed Care – PPO

## 2015-10-06 ENCOUNTER — Emergency Department (HOSPITAL_BASED_OUTPATIENT_CLINIC_OR_DEPARTMENT_OTHER)
Admission: EM | Admit: 2015-10-06 | Discharge: 2015-10-06 | Disposition: A | Discharge status: Home / Self Care | Payer: BC Managed Care – PPO | Attending: Emergency Medicine | Admitting: Emergency Medicine

## 2015-10-06 DIAGNOSIS — J320 Chronic maxillary sinusitis: Secondary | ICD-10-CM | POA: Diagnosis not present

## 2015-10-06 DIAGNOSIS — I10 Essential (primary) hypertension: Secondary | ICD-10-CM | POA: Insufficient documentation

## 2015-10-06 DIAGNOSIS — Z791 Long term (current) use of non-steroidal anti-inflammatories (NSAID): Secondary | ICD-10-CM | POA: Insufficient documentation

## 2015-10-06 DIAGNOSIS — Z862 Personal history of diseases of the blood and blood-forming organs and certain disorders involving the immune mechanism: Secondary | ICD-10-CM | POA: Insufficient documentation

## 2015-10-06 DIAGNOSIS — Z8719 Personal history of other diseases of the digestive system: Secondary | ICD-10-CM | POA: Insufficient documentation

## 2015-10-06 DIAGNOSIS — Z8739 Personal history of other diseases of the musculoskeletal system and connective tissue: Secondary | ICD-10-CM | POA: Diagnosis not present

## 2015-10-06 DIAGNOSIS — Z792 Long term (current) use of antibiotics: Secondary | ICD-10-CM | POA: Insufficient documentation

## 2015-10-06 DIAGNOSIS — J45909 Unspecified asthma, uncomplicated: Secondary | ICD-10-CM | POA: Diagnosis not present

## 2015-10-06 DIAGNOSIS — Z79899 Other long term (current) drug therapy: Secondary | ICD-10-CM | POA: Diagnosis not present

## 2015-10-06 DIAGNOSIS — R05 Cough: Secondary | ICD-10-CM | POA: Diagnosis present

## 2015-10-06 MED ORDER — IPRATROPIUM BROMIDE 0.06 % NA SOLN
2.0000 | Freq: Four times a day (QID) | NASAL | Status: DC
Start: 2015-10-06 — End: 2018-06-17

## 2015-10-06 MED ORDER — IPRATROPIUM-ALBUTEROL 0.5-2.5 (3) MG/3ML IN SOLN
3.0000 mL | Freq: Once | RESPIRATORY_TRACT | Status: AC
  Administered 2015-10-06: 3 mL via RESPIRATORY_TRACT
  Filled 2015-10-06: qty 3

## 2015-10-06 MED ORDER — ALBUTEROL SULFATE (2.5 MG/3ML) 0.083% IN NEBU
2.5000 mg | INHALATION_SOLUTION | Freq: Once | RESPIRATORY_TRACT | Status: AC
  Administered 2015-10-06: 2.5 mg via RESPIRATORY_TRACT
  Filled 2015-10-06: qty 3

## 2015-10-06 MED ORDER — HYDROCOD POLST-CPM POLST ER 10-8 MG/5ML PO SUER: 5.0000 mL | Freq: Two times a day (BID) | ORAL | Status: DC

## 2015-10-06 NOTE — ED Provider Notes (Signed)
CSN: [id number]     Arrival date & time 10/06/15  2041 History  By signing my name below, I, Jolayne Panther, attest that this documentation has been prepared under the direction and in the presence of Tanna Furry, MD. Electronically Signed: Jolayne Panther, Scribe. 10/06/2015. 10:49 PM.     Chief Complaint  Patient presents with  . Cough    The history is provided by the patient. No language interpreter was used.    HPI Comments: Mackenzie Weber is a 54 y.o. female who presents to the Emergency Department complaining of onset of a productive cough with clear and yellow sputum, congestion, chest tightness, and sinus pain onset three days ago after receiving her allergy shot. Pt reports that she first began feeling sick around Thanksgiving. She then went to the allergist the Monday following Thanksgiving and was prescribed antibiotics for treatment of her sinuses. She states that she felt a little better for a few days but notes that two weeks ago she went to allergist again and received an allergy shot and began feeling sick again shortly after. Pt is allergic to cats, dust, trees and grass. She reports that she receives a shot once a week. She is currently taking Prednisone and Amoxicillin which she started five days ago. Pt has an albuterol and symbicort inhaler at home. Pt denies fever.  Past Medical History  Diagnosis Date  . Hypertension   . Asthma   . Seasonal allergies   . IBS (irritable bowel syndrome)   . Sciatica of left side   . Foot pain, right   . Anemia    Past Surgical History  Procedure Laterality Date  . Total abdominal hysterectomy w/ bilateral salpingoophorectomy  1989    LSO-1987; RsO-1998  . Knee surgery      right  . Appendectomy    . Cholecystectomy    . Tonsillectomy    . Wrist surgery      carpal tunnel repair  . Breast surgery  2001    /biopsy-benign  . Small bowel surgery    . Excisional hemorrhoidectomy    . Bladder suspension  2012     TVT  . Abdominal hysterectomy     Family History  Problem Relation Age of Onset  . Diabetes Father   . Hypertension Father   . Diabetes Mother   . Hypertension Mother   . Asthma Mother   . Diabetes Brother   . Hypertension Sister   . Diabetes Sister   . Cancer Sister 34    breast  . Hypertension Paternal Uncle    Social History  Substance Use Topics  . Smoking status: Never Smoker   . Smokeless tobacco: Never Used  . Alcohol Use: No   OB History    Gravida Para Term Preterm AB TAB SAB Ectopic Multiple Living   2 2             Review of Systems  Constitutional: Negative for fever, chills, diaphoresis, appetite change and fatigue.  HENT: Positive for congestion and sinus pressure. Negative for mouth sores, sore throat and trouble swallowing.   Eyes: Negative for visual disturbance.  Respiratory: Positive for cough and chest tightness. Negative for shortness of breath and wheezing.   Cardiovascular: Negative for chest pain.  Gastrointestinal: Negative for nausea, vomiting, abdominal pain, diarrhea and abdominal distention.  Endocrine: Negative for polydipsia, polyphagia and polyuria.  Genitourinary: Negative for dysuria, frequency and hematuria.  Musculoskeletal: Negative for gait problem.  Skin: Negative for  color change, pallor and rash.  Neurological: Negative for dizziness, syncope, light-headedness and headaches.  Hematological: Does not bruise/bleed easily.  Psychiatric/Behavioral: Negative for behavioral problems and confusion.   Allergies  Cortisone; Depo-medrol; and Aspirin  Home Medications   Prior to Admission medications   Medication Sig Start Date End Date Taking? Authorizing Provider  albuterol (PROVENTIL HFA;VENTOLIN HFA) 108 (90 BASE) MCG/ACT inhaler Inhale 2 puffs into the lungs every 6 (six) hours as needed for wheezing or shortness of breath. 02/09/15   Tanda Rockers, MD  budesonide-formoterol [facility]) 80-4.5 MCG/ACT inhaler Take 2 puffs first  thing in am and then another 2 puffs about 12 hours later. 02/09/15   Tanda Rockers, MD  cefUROXime (CEFTIN) 500 MG tablet Take 1 tablet (500 mg total) by mouth 2 (two) times daily with a meal. 09/15/15   Gildardo Cranker, DO  chlorpheniramine-HYDROcodone (TUSSIONEX PENNKINETIC ER) 10-8 MG/5ML SUER Take 5 mLs by mouth 2 (two) times daily. 10/06/15   Tanna Furry, MD  EPINEPHrine (EPIPEN JR) 0.15 MG/0.3ML injection Inject 0.15 mg into the muscle daily as needed for anaphylaxis.     Historical Provider, MD  estradiol (ESTRACE) 0.1 MG/GM vaginal cream Place [id number] Applicatorfuls vaginally daily. 11/03/12   Earnstine Regal, PA-C  hydrocortisone-pramoxine (PROCTOFOAM HC) rectal foam Place 1 applicator rectally at bedtime. 08/24/15   Gildardo Cranker, DO  ipratropium (ATROVENT) 0.06 % nasal spray Place 2 sprays into both nostrils 4 (four) times daily. 10/06/15   Tanna Furry, MD  losartan-hydrochlorothiazide (HYZAAR) 100-12.5 MG per tablet Take 1 tablet by mouth daily. 09/04/14   Historical Provider, MD  meloxicam (MOBIC) 7.5 MG tablet Take 1 tablet (7.5 mg total) by mouth daily. May take up to 2 tabs po daily prn severe pain 07/20/15   Gildardo Cranker, DO  methocarbamol (ROBAXIN) 750 MG tablet Take 750 mg by mouth every 8 (eight) hours as needed for muscle spasms.    Historical Provider, MD  Multiple Vitamin (MULTIVITAMIN) capsule Take 1 capsule by mouth daily.    Historical Provider, MD  triamterene-hydrochlorothiazide (MAXZIDE) 75-50 MG per tablet Take 0.5 tablets by mouth daily.     Historical Provider, MD   BP 133/81 mmHg  Pulse 73  Temp(Src) 98.2 F (36.8 C) (Oral)  Resp 20  Ht 5' (1.524 m)  Wt 224 lb (101.606 kg)  BMI 43.75 kg/m2  SpO2 98% Physical Exam  Constitutional: She is oriented to person, place, and time. She appears well-developed and well-nourished. No distress.  HENT:  Head: Normocephalic.  Nasal congestion bilaterally   Eyes: Conjunctivae are normal. Pupils are equal, round, and reactive to  light. No scleral icterus.  Neck: Normal range of motion. Neck supple. No thyromegaly present.  Cardiovascular: Normal rate and regular rhythm.  Exam reveals no gallop and no friction rub.   No murmur heard. Pulmonary/Chest: Effort normal and breath sounds normal. No respiratory distress. She has no wheezes. She has no rales.  Abdominal: Soft. Bowel sounds are normal. She exhibits no distension. There is no tenderness. There is no rebound.  Musculoskeletal: Normal range of motion.  Neurological: She is alert and oriented to person, place, and time.  Skin: Skin is warm and dry. No rash noted.  Psychiatric: She has a normal mood and affect. Her behavior is normal.    ED Course  Procedures  DIAGNOSTIC STUDIES:    Oxygen Saturation is 98% on RA, normal by my interpretation.   COORDINATION OF CARE:  10:33 PM Will prescribe pt steroid inhaler for her nose.  Discussed treatment plan with pt at bedside and pt agreed to plan.   Imaging Review Dg Chest 2 View  10/06/2015  CLINICAL DATA:  Cough, congestion, raspy voice since Nov. 24, 2016 no relief. HX: HTN, Asthma, Breast surgery, Hysterectomy EXAM: CHEST  2 VIEW COMPARISON:  07/31/2015 FINDINGS: Cardiac silhouette borderline enlarged. Normal mediastinal and hilar contours. Clear lungs. No pleural effusion or pneumothorax. Skeletal structures are unremarkable. IMPRESSION: No active cardiopulmonary disease. Electronically Signed   By: Lajean Manes M.D.   On: 10/06/2015 21:18   I have personally reviewed and evaluated these images as part of my medical decision-making.    MDM   Final diagnoses:  Chronic maxillary sinusitis    Clear lungs.  Not febrile, bronchospastic, or hypoxemic.  Will offer sx relief with tussionex. Recommend atrovent nasal (pt doesnot 'like' flonase), and mucinex.   I personally performed the services described in this documentation, which was scribed in my presence. The recorded information has been reviewed and is  accurate.    Tanna Furry, MD 10/06/15 [phone]

## 2015-10-06 NOTE — ED Notes (Signed)
Cough for the last several weeks, was put on amoxicillin and prednisone on Saturday for URI symptoms and states she does not feel any better.  Used her inhaler prior to arrival, no xray done at doctor's on Saturday, no fevers, lungs clear in triage.

## 2015-10-06 NOTE — Discharge Instructions (Signed)

## 2015-10-06 NOTE — ED Notes (Signed)
MD at bedside. 

## 2015-10-19 ENCOUNTER — Encounter: Payer: BC Managed Care – PPO | Admitting: Internal Medicine

## 2015-10-26 ENCOUNTER — Encounter: Payer: Self-pay | Admitting: Internal Medicine

## 2015-10-26 ENCOUNTER — Ambulatory Visit (INDEPENDENT_AMBULATORY_CARE_PROVIDER_SITE_OTHER): Payer: BC Managed Care – PPO | Admitting: Internal Medicine

## 2015-10-26 VITALS — BP 120/64 | HR 84 | Temp 98.3°F | Resp 20 | Ht 60.0 in | Wt 225.6 lb

## 2015-10-26 DIAGNOSIS — M5431 Sciatica, right side: Secondary | ICD-10-CM | POA: Diagnosis not present

## 2015-10-26 DIAGNOSIS — M6248 Contracture of muscle, other site: Secondary | ICD-10-CM | POA: Diagnosis not present

## 2015-10-26 DIAGNOSIS — M9905 Segmental and somatic dysfunction of pelvic region: Secondary | ICD-10-CM | POA: Diagnosis not present

## 2015-10-26 DIAGNOSIS — M9904 Segmental and somatic dysfunction of sacral region: Secondary | ICD-10-CM

## 2015-10-26 DIAGNOSIS — M9903 Segmental and somatic dysfunction of lumbar region: Secondary | ICD-10-CM | POA: Diagnosis not present

## 2015-10-26 DIAGNOSIS — M9901 Segmental and somatic dysfunction of cervical region: Secondary | ICD-10-CM

## 2015-10-26 DIAGNOSIS — M9906 Segmental and somatic dysfunction of lower extremity: Secondary | ICD-10-CM | POA: Diagnosis not present

## 2015-10-26 DIAGNOSIS — M62838 Other muscle spasm: Secondary | ICD-10-CM

## 2015-10-26 NOTE — Progress Notes (Signed)
Patient ID: Mackenzie Weber, female   DOB: 1961/09/14, 55 y.o.   MRN: [id number]    Location:    [facility]   Place of Service:   OFFICE  Chief Complaint  Patient presents with  . Medical Management of Chronic Issues    HPI:  55 yo female seen today for f/u sciatica and LBP. Back pain improving. No sciatica. She still has left knee pain and swelling. She was seen in the ER mid Oct for muscle strain/sprain under right breast after leaving water aerobics. Cardiac w/u neg. No loss of bowel/bladder control. No falls  She has trouble with LE swelling. She sits at the computer for long periods of time. She sporadically takes maxzide. She does takes hyzaar daily.  BP controlled on hyzaar and maxzide  She uses HRT due postmenopausal sx's  Asthma stable on symbicort and prn HFA  Past Medical History  Diagnosis Date  . Hypertension   . Asthma   . Seasonal allergies   . IBS (irritable bowel syndrome)   . Sciatica of left side   . Foot pain, right   . Anemia     Past Surgical History  Procedure Laterality Date  . Total abdominal hysterectomy w/ bilateral salpingoophorectomy  1989    LSO-1987; RsO-1998  . Knee surgery      right  . Appendectomy    . Cholecystectomy    . Tonsillectomy    . Wrist surgery      carpal tunnel repair  . Breast surgery  2001    /biopsy-benign  . Small bowel surgery    . Excisional hemorrhoidectomy    . Bladder suspension  2012    TVT  . Abdominal hysterectomy      Patient Care Team: Gildardo Cranker, MD as PCP - General  Social History   Social History  . Marital Status: Married    Spouse Name: N/A  . Number of Children: N/A  . Years of Education: N/A   Occupational History  . retired in 2015 from [employer].    Social History Main Topics  . Smoking status: Never Smoker   . Smokeless tobacco: Never Used  . Alcohol Use: No  . Drug Use: No  . Sexual Activity:    Partners: Male    Birth Control/ Protection: Surgical   Other Topics  Concern  . Not on file   Social History Narrative     reports that she has never smoked. She has never used smokeless tobacco. She reports that she does not drink alcohol or use illicit drugs.  Allergies  Allergen Reactions  . Cortisone     Red area around injection site x 62mh  . Depo-Medrol [Methylprednisolone Acetate] Nausea Only and Other (See Comments)    Dizziness  . Aspirin Anxiety and Other (See Comments)    Rapid Heart beat    Medications: Patient's Medications  New Prescriptions   No medications on file  Previous Medications   ALBUTEROL (PROVENTIL HFA;VENTOLIN HFA) 108 (90 BASE) MCG/ACT INHALER    Inhale 2 puffs into the lungs every 6 (six) hours as needed for wheezing or shortness of breath.   BUDESONIDE-FORMOTEROL (SYMBICORT) 80-4.5 MCG/ACT INHALER    Take 2 puffs first thing in am and then another 2 puffs about 12 hours later.   CEFUROXIME (CEFTIN) 500 MG TABLET    Take 1 tablet (500 mg total) by mouth 2 (two) times daily with a meal.   CHLORPHENIRAMINE-HYDROCODONE (TUSSIONEX PENNKINETIC ER) 10-8  MG/5ML SUER    Take 5 mLs by mouth 2 (two) times daily.   EPINEPHRINE (EPIPEN JR) 0.15 MG/0.3ML INJECTION    Inject 0.15 mg into the muscle daily as needed for anaphylaxis.    ESTRADIOL (ESTRACE) 0.1 MG/GM VAGINAL CREAM    Place 9.62 Applicatorfuls vaginally daily.   HYDROCORTISONE-PRAMOXINE (PROCTOFOAM HC) RECTAL FOAM    Place 1 applicator rectally at bedtime.   IPRATROPIUM (ATROVENT) 0.06 % NASAL SPRAY    Place 2 sprays into both nostrils 4 (four) times daily.   LOSARTAN-HYDROCHLOROTHIAZIDE (HYZAAR) 100-12.5 MG PER TABLET    Take 1 tablet by mouth daily.   MELOXICAM (MOBIC) 7.5 MG TABLET    Take 1 tablet (7.5 mg total) by mouth daily. May take up to 2 tabs po daily prn severe pain   METHOCARBAMOL (ROBAXIN) 750 MG TABLET    Take 750 mg by mouth every 8 (eight) hours as needed for muscle spasms.   MULTIPLE VITAMIN (MULTIVITAMIN) CAPSULE    Take 1 capsule by mouth daily.    TRIAMTERENE-HYDROCHLOROTHIAZIDE (MAXZIDE) 75-50 MG PER TABLET    Take 0.5 tablets by mouth daily.   Modified Medications   No medications on file  Discontinued Medications   No medications on file    Review of Systems  Constitutional: Positive for activity change.  Musculoskeletal: Positive for back pain, joint swelling, arthralgias, gait problem and neck pain.  All other systems reviewed and are negative.   Filed Vitals:   10/26/15 1306  BP: 120/64  Pulse: 84  Temp: 98.3 F (36.8 C)  TempSrc: Oral  Resp: 20  Height: 5' (1.524 m)  Weight: 225 lb 9.6 oz (102.331 kg)  SpO2: 97%   Body mass index is 44.06 kg/(m^2).  Physical Exam  Constitutional: She is oriented to person, place, and time. She appears well-developed and well-nourished. No distress.  Musculoskeletal: She exhibits edema and tenderness.  (+) left standing flexion test; sacral torsion; left pelvic inflare with SI joint restriction and ASIS TTP; paravertebral lumbar, thoracic, cervical muscle hypertrophy with ropy tissue texture changes; OA extended; reduced ROM at lumbar and cervical spine; right short leg; strength intact; trace LE swelling; gait antalgic  Neurological: She is alert and oriented to person, place, and time.  Skin: Skin is warm and dry. No rash noted.     Psychiatric: She has a normal mood and affect. Her behavior is normal. Thought content normal.     Labs reviewed: Admission on 07/31/2015, Discharged on 07/31/2015  Component Date Value Ref Range Status  . WBC 07/31/2015 6.8  4.0 - 10.5 K/uL Final  . RBC 07/31/2015 5.34* 3.87 - 5.11 MIL/uL Final  . Hemoglobin 07/31/2015 10.7* 12.0 - 15.0 g/dL Final  . HCT 07/31/2015 33.9* 36.0 - 46.0 % Final  . MCV 07/31/2015 63.5* 78.0 - 100.0 fL Final  . MCH 07/31/2015 20.0* 26.0 - 34.0 pg Final  . MCHC 07/31/2015 31.6  30.0 - 36.0 g/dL Final  . RDW 07/31/2015 17.4* 11.5 - 15.5 % Final  . Platelets 07/31/2015 351  150 - 400 K/uL Final  . Neutrophils  Relative % 07/31/2015 51   Final  . Lymphocytes Relative 07/31/2015 39   Final  . Monocytes Relative 07/31/2015 8   Final  . Eosinophils Relative 07/31/2015 2   Final  . Basophils Relative 07/31/2015 0   Final  . Neutro Abs 07/31/2015 3.5  1.7 - 7.7 K/uL Final  . Lymphs Abs 07/31/2015 2.7  0.7 - 4.0 K/uL Final  . Monocytes Absolute  07/31/2015 0.5  0.1 - 1.0 K/uL Final  . Eosinophils Absolute 07/31/2015 0.1  0.0 - 0.7 K/uL Final  . Basophils Absolute 07/31/2015 0.0  0.0 - 0.1 K/uL Final  . RBC Morphology 07/31/2015 TARGET CELLS   Final   Comment: POLYCHROMASIA PRESENT ELLIPTOCYTES   . Sodium 07/31/2015 141  135 - 145 mmol/L Final  . Potassium 07/31/2015 3.3* 3.5 - 5.1 mmol/L Final  . Chloride 07/31/2015 106  101 - 111 mmol/L Final  . CO2 07/31/2015 28  22 - 32 mmol/L Final  . Glucose, Bld 07/31/2015 113* 65 - 99 mg/dL Final  . BUN 07/31/2015 17  6 - 20 mg/dL Final  . Creatinine, Ser 07/31/2015 0.88  0.44 - 1.00 mg/dL Final  . Calcium 07/31/2015 9.9  8.9 - 10.3 mg/dL Final  . GFR calc non Af Amer 07/31/2015 >60  >60 mL/min Final  . GFR calc Af Amer 07/31/2015 >60  >60 mL/min Final   Comment: (NOTE) The eGFR has been calculated using the CKD EPI equation. This calculation has not been validated in all clinical situations. eGFR's persistently <60 mL/min signify possible Chronic Kidney Disease.   . Anion gap 07/31/2015 7  5 - 15 Final  . Troponin I 07/31/2015 <0.03  <0.031 ng/mL Final   Comment:        NO INDICATION OF MYOCARDIAL INJURY.   Marland Kitchen D-Dimer, Quant 07/31/2015 <0.27  0.00 - 0.48 ug/mL-FEU Final   Comment:        AT THE INHOUSE ESTABLISHED CUTOFF VALUE OF 0.48 ug/mL FEU, THIS ASSAY HAS BEEN DOCUMENTED IN THE LITERATURE TO HAVE A SENSITIVITY AND NEGATIVE PREDICTIVE VALUE OF AT LEAST 98 TO 99%.  THE TEST RESULT SHOULD BE CORRELATED WITH AN ASSESSMENT OF THE CLINICAL PROBABILITY OF DVT / VTE.     Dg Chest 2 View  10/06/2015  CLINICAL DATA:  Cough, congestion, raspy  voice since Nov. 24, 2016 no relief. HX: HTN, Asthma, Breast surgery, Hysterectomy EXAM: CHEST  2 VIEW COMPARISON:  07/31/2015 FINDINGS: Cardiac silhouette borderline enlarged. Normal mediastinal and hilar contours. Clear lungs. No pleural effusion or pneumothorax. Skeletal structures are unremarkable. IMPRESSION: No active cardiopulmonary disease. Electronically Signed   By: Lajean Manes M.D.   On: 10/06/2015 21:18     Assessment/Plan   ICD-9-CM ICD-10-CM   1. Chronic sciatica, right 724.3 M54.31   2. Neck muscle spasm 728.85 M62.48   3. Somatic dysfunction of lower extremity 739.6 M99.06   4. Somatic dysfunction of pelvic region 739.5 M99.05   5. Somatic dysfunction of lumbar region 739.3 M99.03   6. Somatic dysfunction of cervical region 739.1 M99.01   7. Somatic dysfunction of sacral region 739.4 M99.04     PROCEDURE NOTE:  After verbal consent obtained, OMT utilized with improvement in ROM, tissue texture, asymmetry and tenderness. Pt tolerated procedure well.  OMT Treatment 10/26/2015 09/16/2015 08/24/2015 07/20/2015 06/22/2015 05/25/2015 05/11/2015  Head/Face - CR;MFR;ST;IND/INR - - - CR;MFR;ST;IND/INR;ME CR;MFR;ST;IND/INR;ME  Head/Face Response - I - - - I I  Neck  CS;IND/INR;MFR;ST CS;IND/INR;MFR;ST - CS;IND/INR;MFR;ST;ME CS;IND/INR;MFR;ST CS;IND/INR;MFR;ST CS;IND/INR;MFR;ST;ME  Neck Response I I - I I I I  T-5-T9  - - LAS;MFR;ST;IND/INR - - - -  T5-T9 Response - - I - - - -  Ribs  - - IND/INR;LAS;MFR;ST - - - -  Ribs Response - - I - - - -  Lumbar IND/INR;LAS;MFR;ST IND/INR;LAS;MFR;ST IND/INR;LAS;MFR;ST IND/INR;LAS;MFR;ST IND/INR;LAS;MFR;ST IND/INR;LAS;MFR;ST IND/INR;LAS;MFR;ST  Lumbar Response _0  I I  Sacrum IND/INR;MFR;ST IND/INR;MFR;ST IND/INR;MFR;ST IND/INR;MFR;ST IND/INR;MFR;ST  IND/INR;MFR;ST IND/INR;MFR;ST  Sacrum Response _0  I I  Pelvis LAS;MFR;ST;IND/INR LAS;MFR;ST;IND/INR LAS;MFR;ST;IND/INR LAS;MFR;ST;IND/INR;ME LAS;MFR;ST;IND/INR;ME LAS;MFR;ST;IND/INR  LAS;MFR;ST;IND/INR  Pelvis Response _1  I I  Lower Ext. LAS;MFR;ST;IND/INR;DIR LAS;MFR;ST;IND/INR;DIR LAS;MFR;ST;IND/INR;DIR LAS;MFR;ST;IND/INR;DIR LAS;MFR;ST;IND/INR;DIR LAS;MFR;ST;IND/INR;DIR LAS;MFR;ST;IND/INR;DIR  Lower Ext. Response _2  I I    Push fluids and rest today. No heavy lifting. May resume nml activity in the AM.  Continue current medications as ordered  Take maxzide daily as ordered  Follow up in 4 weeks for OMM  [facility] S. Perlie Gold  [facility] [address] [place], [place] [address] [phone] Cell (Monday-Friday 8 AM - 5 PM) [phone] After 5 PM and follow prompts

## 2015-10-26 NOTE — Patient Instructions (Addendum)
Push fluids and rest today. No heavy lifting. May resume nml activity in the AM.  Continue current medications as ordered  Take maxzide daily as ordered  Follow up in 4 weeks for OMM

## 2015-11-09 ENCOUNTER — Other Ambulatory Visit: Payer: Self-pay

## 2015-11-09 MED ORDER — LOSARTAN POTASSIUM-HCTZ 100-12.5 MG PO TABS: 1.0000 | ORAL_TABLET | Freq: Every day | ORAL | Status: DC

## 2015-11-16 ENCOUNTER — Encounter: Payer: Self-pay | Admitting: Internal Medicine

## 2015-11-16 ENCOUNTER — Ambulatory Visit (INDEPENDENT_AMBULATORY_CARE_PROVIDER_SITE_OTHER): Payer: BC Managed Care – PPO | Admitting: Internal Medicine

## 2015-11-16 VITALS — BP 124/80 | HR 80 | Temp 97.9°F | Resp 20 | Ht 60.0 in | Wt 229.2 lb

## 2015-11-16 DIAGNOSIS — M6248 Contracture of muscle, other site: Secondary | ICD-10-CM

## 2015-11-16 DIAGNOSIS — M9902 Segmental and somatic dysfunction of thoracic region: Secondary | ICD-10-CM | POA: Diagnosis not present

## 2015-11-16 DIAGNOSIS — M5432 Sciatica, left side: Secondary | ICD-10-CM | POA: Diagnosis not present

## 2015-11-16 DIAGNOSIS — J4530 Mild persistent asthma, uncomplicated: Secondary | ICD-10-CM | POA: Diagnosis not present

## 2015-11-16 DIAGNOSIS — M9903 Segmental and somatic dysfunction of lumbar region: Secondary | ICD-10-CM

## 2015-11-16 DIAGNOSIS — M9905 Segmental and somatic dysfunction of pelvic region: Secondary | ICD-10-CM

## 2015-11-16 DIAGNOSIS — M9901 Segmental and somatic dysfunction of cervical region: Secondary | ICD-10-CM | POA: Diagnosis not present

## 2015-11-16 DIAGNOSIS — M9906 Segmental and somatic dysfunction of lower extremity: Secondary | ICD-10-CM | POA: Diagnosis not present

## 2015-11-16 DIAGNOSIS — M9904 Segmental and somatic dysfunction of sacral region: Secondary | ICD-10-CM | POA: Diagnosis not present

## 2015-11-16 DIAGNOSIS — M62838 Other muscle spasm: Secondary | ICD-10-CM

## 2015-11-16 MED ORDER — METHYLPREDNISOLONE 4 MG PO TBPK: ORAL_TABLET | ORAL | Status: DC

## 2015-11-16 NOTE — Patient Instructions (Addendum)
Push fluids and rest today. No heavy lifting. May resume nml activity in the AM.  Take prednisone as directed with food  Use inhaler as needed  Follow up in 2-3 weeks for OMM

## 2015-11-16 NOTE — Progress Notes (Signed)
Patient ID: Mackenzie Weber, female   DOB: 04-29-1961, 55 y.o.   MRN: [id number]    Location:    [facility]   Place of Service:   OFFICE  Chief Complaint  Patient presents with  . Medical Management of Chronic Issues    OMM/ cough/ upper respitory issues    HPI:  55 yo female seen today for f/u. She reports 2 day hx URI with deepening voice, chest congestion, chest tightness and dry cough. Started OTC antihistamine yesterday. No f/c, dizziness. (+) HA. No sick contacts.  She c/o b/l leg swelling with L>R sciatic pain. She started exercising again. She has been sitting at computer more. She doe not elevate feet when seated.   Past Medical History  Diagnosis Date  . Hypertension   . Asthma   . Seasonal allergies   . IBS (irritable bowel syndrome)   . Sciatica of left side   . Foot pain, right   . Anemia     Past Surgical History  Procedure Laterality Date  . Total abdominal hysterectomy w/ bilateral salpingoophorectomy  1989    LSO-1987; RsO-1998  . Knee surgery      right  . Appendectomy    . Cholecystectomy    . Tonsillectomy    . Wrist surgery      carpal tunnel repair  . Breast surgery  2001    /biopsy-benign  . Small bowel surgery    . Excisional hemorrhoidectomy    . Bladder suspension  2012    TVT  . Abdominal hysterectomy      Patient Care Team: Gildardo Cranker, MD as PCP - General  Social History   Social History  . Marital Status: Married    Spouse Name: N/A  . Number of Children: N/A  . Years of Education: N/A   Occupational History  . retired in 2015 from [employer].    Social History Main Topics  . Smoking status: Never Smoker   . Smokeless tobacco: Never Used  . Alcohol Use: No  . Drug Use: No  . Sexual Activity:    Partners: Male    Birth Control/ Protection: Surgical   Other Topics Concern  . Not on file   Social History Narrative     reports that she has never smoked. She has never used smokeless tobacco. She reports that she  does not drink alcohol or use illicit drugs.  Allergies  Allergen Reactions  . Cortisone     Red area around injection site x 72mh  . Depo-Medrol [Methylprednisolone Acetate] Nausea Only and Other (See Comments)    Dizziness  . Aspirin Anxiety and Other (See Comments)    Rapid Heart beat    Medications: Patient's Medications  New Prescriptions   No medications on file  Previous Medications   ALBUTEROL (PROVENTIL HFA;VENTOLIN HFA) 108 (90 BASE) MCG/ACT INHALER    Inhale 2 puffs into the lungs every 6 (six) hours as needed for wheezing or shortness of breath.   BUDESONIDE-FORMOTEROL (SYMBICORT) 80-4.5 MCG/ACT INHALER    Take 2 puffs first thing in am and then another 2 puffs about 12 hours later.   CHLORPHENIRAMINE-HYDROCODONE (TUSSIONEX PENNKINETIC ER) 10-8 MG/5ML SUER    Take 5 mLs by mouth 2 (two) times daily.   EPINEPHRINE (EPIPEN JR) 0.15 MG/0.3ML INJECTION    Inject 0.15 mg into the muscle daily as needed for anaphylaxis.    ESTRADIOL (ESTRACE) 0.1 MG/GM VAGINAL CREAM    Place 09.98Applicatorfuls vaginally  daily.   IPRATROPIUM (ATROVENT) 0.06 % NASAL SPRAY    Place 2 sprays into both nostrils 4 (four) times daily.   LOSARTAN-HYDROCHLOROTHIAZIDE (HYZAAR) 100-12.5 MG TABLET    Take 1 tablet by mouth daily.   MELOXICAM (MOBIC) 7.5 MG TABLET    Take 1 tablet (7.5 mg total) by mouth daily. May take up to 2 tabs po daily prn severe pain   METHOCARBAMOL (ROBAXIN) 750 MG TABLET    Take 750 mg by mouth every 8 (eight) hours as needed for muscle spasms.   MULTIPLE VITAMIN (MULTIVITAMIN) CAPSULE    Take 1 capsule by mouth daily.   TRIAMTERENE-HYDROCHLOROTHIAZIDE (MAXZIDE) 75-50 MG PER TABLET    Take 0.5 tablets by mouth daily.   Modified Medications   No medications on file  Discontinued Medications   HYDROCORTISONE-PRAMOXINE (PROCTOFOAM HC) RECTAL FOAM    Place 1 applicator rectally at bedtime.    Review of Systems  HENT: Positive for voice change.   Respiratory: Positive for cough and  chest tightness.   Musculoskeletal: Positive for back pain, arthralgias and gait problem.  Neurological: Positive for headaches.  All other systems reviewed and are negative.   Filed Vitals:   11/16/15 1313  BP: 124/80  Pulse: 80  Temp: 97.9 F (36.6 C)  TempSrc: Oral  Resp: 20  Height: 5' (1.524 m)  Weight: 229 lb 3.2 oz (103.964 kg)  SpO2: 96%   Body mass index is 44.76 kg/(m^2).  Physical Exam  Constitutional: She is oriented to person, place, and time. She appears well-developed and well-nourished.  HENT:  Mouth/Throat: Oropharyngeal exudate present.  Cardiovascular: Normal rate, regular rhythm and intact distal pulses.  Exam reveals no gallop and no friction rub.   Murmur (1/6 SEM) heard. +1 pitting LE edema b/l. No calf TTP  Pulmonary/Chest: Effort normal. No respiratory distress. She has wheezes ((+) end expiratory with prolonged expiratory phase). She has no rales.  Reduced BS at base b/l.   Musculoskeletal: She exhibits edema and tenderness.  (+) left standing flexion test; left pelvic outflare with SI joint restriction and ASIS TTP; right short leg; sacral torsion with restriction of ROM; gait antalgic; OA extended; paravertebral lumbar, thoracic and cervical muscle hypertrophy with ropy tissue texture changes; strength intact; b/l distal LE swelling  Neurological: She is alert and oriented to person, place, and time.  Skin: Skin is warm and dry. No rash noted.     Psychiatric: She has a normal mood and affect. Her behavior is normal. Judgment and thought content normal.     Labs reviewed: No visits with results within 3 Month(s) from this visit. Latest known visit with results is:  Admission on 07/31/2015, Discharged on 07/31/2015  Component Date Value Ref Range Status  . WBC 07/31/2015 6.8  4.0 - 10.5 K/uL Final  . RBC 07/31/2015 5.34* 3.87 - 5.11 MIL/uL Final  . Hemoglobin 07/31/2015 10.7* 12.0 - 15.0 g/dL Final  . HCT 07/31/2015 33.9* 36.0 - 46.0 % Final    . MCV 07/31/2015 63.5* 78.0 - 100.0 fL Final  . MCH 07/31/2015 20.0* 26.0 - 34.0 pg Final  . MCHC 07/31/2015 31.6  30.0 - 36.0 g/dL Final  . RDW 07/31/2015 17.4* 11.5 - 15.5 % Final  . Platelets 07/31/2015 351  150 - 400 K/uL Final  . Neutrophils Relative % 07/31/2015 51   Final  . Lymphocytes Relative 07/31/2015 39   Final  . Monocytes Relative 07/31/2015 8   Final  . Eosinophils Relative 07/31/2015 2   Final  .  Basophils Relative 07/31/2015 0   Final  . Neutro Abs 07/31/2015 3.5  1.7 - 7.7 K/uL Final  . Lymphs Abs 07/31/2015 2.7  0.7 - 4.0 K/uL Final  . Monocytes Absolute 07/31/2015 0.5  0.1 - 1.0 K/uL Final  . Eosinophils Absolute 07/31/2015 0.1  0.0 - 0.7 K/uL Final  . Basophils Absolute 07/31/2015 0.0  0.0 - 0.1 K/uL Final  . RBC Morphology 07/31/2015 TARGET CELLS   Final   Comment: POLYCHROMASIA PRESENT ELLIPTOCYTES   . Sodium 07/31/2015 141  135 - 145 mmol/L Final  . Potassium 07/31/2015 3.3* 3.5 - 5.1 mmol/L Final  . Chloride 07/31/2015 106  101 - 111 mmol/L Final  . CO2 07/31/2015 28  22 - 32 mmol/L Final  . Glucose, Bld 07/31/2015 113* 65 - 99 mg/dL Final  . BUN 07/31/2015 17  6 - 20 mg/dL Final  . Creatinine, Ser 07/31/2015 0.88  0.44 - 1.00 mg/dL Final  . Calcium 07/31/2015 9.9  8.9 - 10.3 mg/dL Final  . GFR calc non Af Amer 07/31/2015 >60  >60 mL/min Final  . GFR calc Af Amer 07/31/2015 >60  >60 mL/min Final   Comment: (NOTE) The eGFR has been calculated using the CKD EPI equation. This calculation has not been validated in all clinical situations. eGFR's persistently <60 mL/min signify possible Chronic Kidney Disease.   . Anion gap 07/31/2015 7  5 - 15 Final  . Troponin I 07/31/2015 <0.03  <0.031 ng/mL Final   Comment:        NO INDICATION OF MYOCARDIAL INJURY.   Marland Kitchen D-Dimer, Quant 07/31/2015 <0.27  0.00 - 0.48 ug/mL-FEU Final   Comment:        AT THE INHOUSE ESTABLISHED CUTOFF VALUE OF 0.48 ug/mL FEU, THIS ASSAY HAS BEEN DOCUMENTED IN THE LITERATURE TO  HAVE A SENSITIVITY AND NEGATIVE PREDICTIVE VALUE OF AT LEAST 98 TO 99%.  THE TEST RESULT SHOULD BE CORRELATED WITH AN ASSESSMENT OF THE CLINICAL PROBABILITY OF DVT / VTE.     No results found.   Assessment/Plan   ICD-9-CM ICD-10-CM   1. Sciatica of left side 724.3 M54.32 methylPREDNISolone (MEDROL DOSEPAK) 4 MG TBPK tablet  2. Mild persistent asthma with allergic rhinitis   493.00 J45.30 methylPREDNISolone (MEDROL DOSEPAK) 4 MG TBPK tablet  3. Neck muscle spasm 728.85 M62.48   4. Muscle spasm of left lower extremity 728.85 M62.838   5. Somatic dysfunction of lower extremity 739.6 M99.06   6. Somatic dysfunction of lumbar region 739.3 M99.03   7. Somatic dysfunction of pelvic region 739.5 M99.05   8. Somatic dysfunction of sacral region 739.4 M99.04   9. Somatic dysfunction of spine, thoracic 739.2 M99.02   10. Somatic dysfunction of cervical region 739.1 M99.01     PROCEDURE NOTE:  After verbal consent obtained, OMT utilized with improvement in ROM, tissue texture, asymmetry and tenderness. Pt tolerated procedure well.  OMT Treatment 11/16/2015 10/26/2015 09/16/2015 08/24/2015 07/20/2015 06/22/2015 05/25/2015  Head/Face - - CR;MFR;ST;IND/INR - - - CR;MFR;ST;IND/INR;ME  Head/Face Response - - I - - - I  Neck  CS;IND/INR;MFR;ST;ME;DIR CS;IND/INR;MFR;ST CS;IND/INR;MFR;ST - CS;IND/INR;MFR;ST;ME CS;IND/INR;MFR;ST CS;IND/INR;MFR;ST  Neck Response I I I - I I I  T1-T4  LAS;MFR;ST;IND/INR - - - - - -  T1-T4 Response I - - - - - -  T-5-T9  LAS;MFR;ST;IND/INR - - LAS;MFR;ST;IND/INR - - -  T5-T9 Response I - - I - - -  Ribs  - - - IND/INR;LAS;MFR;ST - - -  Ribs Response - - - I - - -  Lumbar IND/INR;LAS;MFR;ST IND/INR;LAS;MFR;ST IND/INR;LAS;MFR;ST IND/INR;LAS;MFR;ST IND/INR;LAS;MFR;ST IND/INR;LAS;MFR;ST IND/INR;LAS;MFR;ST  Lumbar Response _0  I I  Sacrum IND/INR;MFR;ST IND/INR;MFR;ST IND/INR;MFR;ST IND/INR;MFR;ST IND/INR;MFR;ST IND/INR;MFR;ST IND/INR;MFR;ST  Sacrum Response _1  I  I  Pelvis LAS;MFR;ST;IND/INR LAS;MFR;ST;IND/INR LAS;MFR;ST;IND/INR LAS;MFR;ST;IND/INR LAS;MFR;ST;IND/INR;ME LAS;MFR;ST;IND/INR;ME LAS;MFR;ST;IND/INR  Pelvis Response _2  I I  Lower Ext. LAS;MFR;ST;IND/INR;DIR LAS;MFR;ST;IND/INR;DIR LAS;MFR;ST;IND/INR;DIR LAS;MFR;ST;IND/INR;DIR LAS;MFR;ST;IND/INR;DIR LAS;MFR;ST;IND/INR;DIR LAS;MFR;ST;IND/INR;DIR  Lower Ext. Response _3  I I     Push fluids and rest today. No heavy lifting. May resume nml activity in the AM.  Keep legs elevated when seated  Take prednisone as directed with food  Use inhaler as needed  Follow up in 2-3 weeks for OMM  [facility] S. Perlie Gold  [facility] [address] [place], [place] [address] [phone] Cell (Monday-Friday 8 AM - 5 PM) [phone] After 5 PM and follow prompts

## 2015-12-02 ENCOUNTER — Ambulatory Visit (INDEPENDENT_AMBULATORY_CARE_PROVIDER_SITE_OTHER): Payer: BC Managed Care – PPO | Admitting: Internal Medicine

## 2015-12-02 ENCOUNTER — Encounter: Payer: Self-pay | Admitting: Internal Medicine

## 2015-12-02 VITALS — BP 118/68 | HR 78 | Temp 97.9°F | Resp 18 | Ht 60.0 in | Wt 225.4 lb

## 2015-12-02 DIAGNOSIS — M9905 Segmental and somatic dysfunction of pelvic region: Secondary | ICD-10-CM

## 2015-12-02 DIAGNOSIS — M9904 Segmental and somatic dysfunction of sacral region: Secondary | ICD-10-CM | POA: Diagnosis not present

## 2015-12-02 DIAGNOSIS — M5432 Sciatica, left side: Secondary | ICD-10-CM

## 2015-12-02 DIAGNOSIS — M9901 Segmental and somatic dysfunction of cervical region: Secondary | ICD-10-CM | POA: Diagnosis not present

## 2015-12-02 DIAGNOSIS — J45909 Unspecified asthma, uncomplicated: Secondary | ICD-10-CM

## 2015-12-02 DIAGNOSIS — M6248 Contracture of muscle, other site: Secondary | ICD-10-CM | POA: Diagnosis not present

## 2015-12-02 DIAGNOSIS — M9906 Segmental and somatic dysfunction of lower extremity: Secondary | ICD-10-CM | POA: Diagnosis not present

## 2015-12-02 DIAGNOSIS — M9903 Segmental and somatic dysfunction of lumbar region: Secondary | ICD-10-CM | POA: Diagnosis not present

## 2015-12-02 DIAGNOSIS — M62838 Other muscle spasm: Secondary | ICD-10-CM | POA: Diagnosis not present

## 2015-12-02 MED ORDER — ALBUTEROL SULFATE HFA 108 (90 BASE) MCG/ACT IN AERS: 2.0000 | INHALATION_SPRAY | Freq: Four times a day (QID) | RESPIRATORY_TRACT | Status: DC | PRN

## 2015-12-02 NOTE — Patient Instructions (Signed)
Push fluids and rest today. No heavy lifting. May resume nml activity in the AM.  Continue current medications as ordered  Follow up in 4 weeks for OMM 

## 2015-12-02 NOTE — Progress Notes (Signed)
Patient ID: Mackenzie Weber, female   DOB: 1961-07-16, 55 y.o.   MRN: [id number]    Location:    [facility]   Place of Service:   OMM  Chief Complaint  Patient presents with  . Medical Management of Chronic Issues    OMM    HPI:  55 yo female seen today for f/u sciatica and LBP. Back pain improving. (+) left sciatic pain. No numbness or tingling. She still has left knee pain and swelling. No loss of bowel/bladder control. No falls  improved LE swelling. She has not been sitting at the computer for long periods of time. She sporadically takes maxzide. She does takes hyzaar daily.  BP controlled on hyzaar and maxzide  She uses HRT due postmenopausal sx's  Asthma stable on symbicort and prn HFA   Past Medical History  Diagnosis Date  . Hypertension   . Asthma   . Seasonal allergies   . IBS (irritable bowel syndrome)   . Sciatica of left side   . Foot pain, right   . Anemia     Past Surgical History  Procedure Laterality Date  . Total abdominal hysterectomy w/ bilateral salpingoophorectomy  1989    LSO-1987; RsO-1998  . Knee surgery      right  . Appendectomy    . Cholecystectomy    . Tonsillectomy    . Wrist surgery      carpal tunnel repair  . Breast surgery  2001    /biopsy-benign  . Small bowel surgery    . Excisional hemorrhoidectomy    . Bladder suspension  2012    TVT  . Abdominal hysterectomy      Patient Care Team: Gildardo Cranker, MD as PCP - General  Social History   Social History  . Marital Status: Married    Spouse Name: N/A  . Number of Children: N/A  . Years of Education: N/A   Occupational History  . retired in 2015 from [employer].    Social History Main Topics  . Smoking status: Never Smoker   . Smokeless tobacco: Never Used  . Alcohol Use: No  . Drug Use: No  . Sexual Activity:    Partners: Male    Birth Control/ Protection: Surgical   Other Topics Concern  . Not on file   Social History Narrative     reports that she  has never smoked. She has never used smokeless tobacco. She reports that she does not drink alcohol or use illicit drugs.  Allergies  Allergen Reactions  . Cortisone     Red area around injection site x 68mh  . Depo-Medrol [Methylprednisolone Acetate] Nausea Only and Other (See Comments)    Dizziness  . Aspirin Anxiety and Other (See Comments)    Rapid Heart beat    Medications: Patient's Medications  New Prescriptions   No medications on file  Previous Medications   ALBUTEROL (PROVENTIL HFA;VENTOLIN HFA) 108 (90 BASE) MCG/ACT INHALER    Inhale 2 puffs into the lungs every 6 (six) hours as needed for wheezing or shortness of breath.   BUDESONIDE-FORMOTEROL (SYMBICORT) 80-4.5 MCG/ACT INHALER    Take 2 puffs first thing in am and then another 2 puffs about 12 hours later.   CHLORPHENIRAMINE-HYDROCODONE (TUSSIONEX PENNKINETIC ER) 10-8 MG/5ML SUER    Take 5 mLs by mouth 2 (two) times daily.   EPINEPHRINE (EPIPEN JR) 0.15 MG/0.3ML INJECTION    Inject 0.15 mg into the muscle daily as needed for  anaphylaxis.    ESTRADIOL (ESTRACE) 0.1 MG/GM VAGINAL CREAM    Place 1.61 Applicatorfuls vaginally daily.   IPRATROPIUM (ATROVENT) 0.06 % NASAL SPRAY    Place 2 sprays into both nostrils 4 (four) times daily.   LOSARTAN-HYDROCHLOROTHIAZIDE (HYZAAR) 100-12.5 MG TABLET    Take 1 tablet by mouth daily.   MELOXICAM (MOBIC) 7.5 MG TABLET    Take 1 tablet (7.5 mg total) by mouth daily. May take up to 2 tabs po daily prn severe pain   METHOCARBAMOL (ROBAXIN) 750 MG TABLET    Take 750 mg by mouth every 8 (eight) hours as needed for muscle spasms.   MULTIPLE VITAMIN (MULTIVITAMIN) CAPSULE    Take 1 capsule by mouth daily.   TRIAMTERENE-HYDROCHLOROTHIAZIDE (MAXZIDE) 75-50 MG PER TABLET    Take 0.5 tablets by mouth daily.   Modified Medications   No medications on file  Discontinued Medications   METHYLPREDNISOLONE (MEDROL DOSEPAK) 4 MG TBPK TABLET    Use as directed. Take with food    Review of Systems    Respiratory: Negative for shortness of breath and wheezing.   Musculoskeletal: Positive for back pain, joint swelling, arthralgias, gait problem and neck pain.  All other systems reviewed and are negative.   Filed Vitals:   12/02/15 1315  BP: 118/68  Pulse: 78  Temp: 97.9 F (36.6 C)  TempSrc: Oral  Resp: 18  Height: 5' (1.524 m)  Weight: 225 lb 6.4 oz (102.241 kg)  SpO2: 98%   Body mass index is 44.02 kg/(m^2).  Physical Exam  Constitutional: She is oriented to person, place, and time. She appears well-developed and well-nourished. No distress.    Musculoskeletal: She exhibits edema and tenderness.  (+) left standing flexion test; sacral torsion; left SI joint restriction; right short leg; left pelvic outflare with ASIS TTP; paravertebral lumbar, thoracic and cervical muscle hypertrophy with ropy tissue texture changes;strength intact; no distal swelling; reduced left hip ROM; left knee swelling with reduced ROM and TTP  Neurological: She is alert and oriented to person, place, and time.  Skin: Skin is warm and dry. No rash noted.  Psychiatric: She has a normal mood and affect. Her behavior is normal. Judgment and thought content normal.     Labs reviewed: No visits with results within 3 Month(s) from this visit. Latest known visit with results is:  Admission on 07/31/2015, Discharged on 07/31/2015  Component Date Value Ref Range Status  . WBC 07/31/2015 6.8  4.0 - 10.5 K/uL Final  . RBC 07/31/2015 5.34* 3.87 - 5.11 MIL/uL Final  . Hemoglobin 07/31/2015 10.7* 12.0 - 15.0 g/dL Final  . HCT 07/31/2015 33.9* 36.0 - 46.0 % Final  . MCV 07/31/2015 63.5* 78.0 - 100.0 fL Final  . MCH 07/31/2015 20.0* 26.0 - 34.0 pg Final  . MCHC 07/31/2015 31.6  30.0 - 36.0 g/dL Final  . RDW 07/31/2015 17.4* 11.5 - 15.5 % Final  . Platelets 07/31/2015 351  150 - 400 K/uL Final  . Neutrophils Relative % 07/31/2015 51   Final  . Lymphocytes Relative 07/31/2015 39   Final  . Monocytes  Relative 07/31/2015 8   Final  . Eosinophils Relative 07/31/2015 2   Final  . Basophils Relative 07/31/2015 0   Final  . Neutro Abs 07/31/2015 3.5  1.7 - 7.7 K/uL Final  . Lymphs Abs 07/31/2015 2.7  0.7 - 4.0 K/uL Final  . Monocytes Absolute 07/31/2015 0.5  0.1 - 1.0 K/uL Final  . Eosinophils Absolute 07/31/2015 0.1  0.0 -  0.7 K/uL Final  . Basophils Absolute 07/31/2015 0.0  0.0 - 0.1 K/uL Final  . RBC Morphology 07/31/2015 TARGET CELLS   Final   Comment: POLYCHROMASIA PRESENT ELLIPTOCYTES   . Sodium 07/31/2015 141  135 - 145 mmol/L Final  . Potassium 07/31/2015 3.3* 3.5 - 5.1 mmol/L Final  . Chloride 07/31/2015 106  101 - 111 mmol/L Final  . CO2 07/31/2015 28  22 - 32 mmol/L Final  . Glucose, Bld 07/31/2015 113* 65 - 99 mg/dL Final  . BUN 07/31/2015 17  6 - 20 mg/dL Final  . Creatinine, Ser 07/31/2015 0.88  0.44 - 1.00 mg/dL Final  . Calcium 07/31/2015 9.9  8.9 - 10.3 mg/dL Final  . GFR calc non Af Amer 07/31/2015 >60  >60 mL/min Final  . GFR calc Af Amer 07/31/2015 >60  >60 mL/min Final   Comment: (NOTE) The eGFR has been calculated using the CKD EPI equation. This calculation has not been validated in all clinical situations. eGFR's persistently <60 mL/min signify possible Chronic Kidney Disease.   . Anion gap 07/31/2015 7  5 - 15 Final  . Troponin I 07/31/2015 <0.03  <0.031 ng/mL Final   Comment:        NO INDICATION OF MYOCARDIAL INJURY.   Marland Kitchen D-Dimer, Quant 07/31/2015 <0.27  0.00 - 0.48 ug/mL-FEU Final   Comment:        AT THE INHOUSE ESTABLISHED CUTOFF VALUE OF 0.48 ug/mL FEU, THIS ASSAY HAS BEEN DOCUMENTED IN THE LITERATURE TO HAVE A SENSITIVITY AND NEGATIVE PREDICTIVE VALUE OF AT LEAST 98 TO 99%.  THE TEST RESULT SHOULD BE CORRELATED WITH AN ASSESSMENT OF THE CLINICAL PROBABILITY OF DVT / VTE.     No results found.   Assessment/Plan   ICD-9-CM ICD-10-CM   1. Sciatica of left side 724.3 M54.32   2. Muscle spasm of left lower extremity 728.85 M62.838   3.  Neck muscle spasm 728.85 M62.48   4. Asthma, chronic, unspecified asthma severity, uncomplicated 160.10 X32.355 albuterol (PROVENTIL HFA;VENTOLIN HFA) 108 (90 Base) MCG/ACT inhaler  5. Somatic dysfunction of pelvic region 739.5 M99.05   6. Somatic dysfunction of lower extremity 739.6 M99.06   7. Somatic dysfunction of lumbar region 739.3 M99.03   8. Somatic dysfunction of sacral region 739.4 M99.04   9. Somatic dysfunction of cervical region 739.1 M99.01     PROCEDURE NOTE:  After verbal consent obtained, OMT utilized with improvement in ROM, tissue texture, asymmetry and tenderness. Pt tolerated procedure well.  OMT Treatment 12/02/2015 11/16/2015 10/26/2015 09/16/2015 08/24/2015 07/20/2015 06/22/2015  Head/Face - - - CR;MFR;ST;IND/INR - - -  Head/Face Response - - - I - - -  Neck  CS;IND/INR;MFR;ST;DIR CS;IND/INR;MFR;ST;ME;DIR CS;IND/INR;MFR;ST CS;IND/INR;MFR;ST - CS;IND/INR;MFR;ST;ME CS;IND/INR;MFR;ST  Neck Response I I I I - I I  T1-T4  - LAS;MFR;ST;IND/INR - - - - -  T1-T4 Response - I - - - - -  T-5-T9  - LAS;MFR;ST;IND/INR - - LAS;MFR;ST;IND/INR - -  T5-T9 Response - I - - I - -  Ribs  - - - - IND/INR;LAS;MFR;ST - -  Ribs Response - - - - I - -  Lumbar IND/INR;LAS;MFR;ST IND/INR;LAS;MFR;ST IND/INR;LAS;MFR;ST IND/INR;LAS;MFR;ST IND/INR;LAS;MFR;ST IND/INR;LAS;MFR;ST IND/INR;LAS;MFR;ST  Lumbar Response I I I I I I I  Sacrum IND/INR;MFR;ST IND/INR;MFR;ST IND/INR;MFR;ST IND/INR;MFR;ST IND/INR;MFR;ST IND/INR;MFR;ST IND/INR;MFR;ST  Sacrum Response I I I I I I I  Pelvis LAS;MFR;ST;IND/INR;ME LAS;MFR;ST;IND/INR LAS;MFR;ST;IND/INR LAS;MFR;ST;IND/INR LAS;MFR;ST;IND/INR LAS;MFR;ST;IND/INR;ME LAS;MFR;ST;IND/INR;ME  Pelvis Response I I I I I I I  Lower Ext. LAS;MFR;ST;IND/INR;DIR LAS;MFR;ST;IND/INR;DIR LAS;MFR;ST;IND/INR;DIR LAS;MFR;ST;IND/INR;DIR LAS;MFR;ST;IND/INR;DIR  LAS;MFR;ST;IND/INR;DIR LAS;MFR;ST;IND/INR;DIR  Lower Ext. Response I I I I I I I     Push fluids and rest today. No heavy  lifting. May resume nml activity in the AM.  Continue current medications as ordered  Follow up in 4 weeks for OMM  [facility] S. Perlie Gold  [facility] [address] [place], [place] [address] [phone] Cell (Monday-Friday 8 AM - 5 PM) [phone] After 5 PM and follow prompts

## 2015-12-30 ENCOUNTER — Ambulatory Visit (INDEPENDENT_AMBULATORY_CARE_PROVIDER_SITE_OTHER): Payer: BC Managed Care – PPO | Admitting: Internal Medicine

## 2015-12-30 ENCOUNTER — Encounter: Payer: Self-pay | Admitting: Internal Medicine

## 2015-12-30 DIAGNOSIS — M9905 Segmental and somatic dysfunction of pelvic region: Secondary | ICD-10-CM | POA: Diagnosis not present

## 2015-12-30 DIAGNOSIS — M9906 Segmental and somatic dysfunction of lower extremity: Secondary | ICD-10-CM

## 2015-12-30 DIAGNOSIS — M79605 Pain in left leg: Secondary | ICD-10-CM | POA: Diagnosis not present

## 2015-12-30 DIAGNOSIS — M9904 Segmental and somatic dysfunction of sacral region: Secondary | ICD-10-CM

## 2015-12-30 DIAGNOSIS — M5432 Sciatica, left side: Secondary | ICD-10-CM | POA: Diagnosis not present

## 2015-12-30 DIAGNOSIS — M9901 Segmental and somatic dysfunction of cervical region: Secondary | ICD-10-CM | POA: Diagnosis not present

## 2015-12-30 DIAGNOSIS — M9903 Segmental and somatic dysfunction of lumbar region: Secondary | ICD-10-CM | POA: Diagnosis not present

## 2015-12-30 DIAGNOSIS — M62838 Other muscle spasm: Secondary | ICD-10-CM | POA: Diagnosis not present

## 2015-12-30 MED ORDER — LORCASERIN HCL 10 MG PO TABS: ORAL_TABLET | ORAL | Status: DC

## 2015-12-30 MED ORDER — KETOROLAC TROMETHAMINE 30 MG/ML IJ SOLN
30.0000 mg | Freq: Once | INTRAMUSCULAR | Status: AC
  Administered 2015-12-30: 30 mg via INTRAMUSCULAR

## 2015-12-30 MED ORDER — KETOROLAC TROMETHAMINE 30 MG/ML IM SOLN: 30.0000 mg | Freq: Once | INTRAMUSCULAR | Status: AC

## 2015-12-30 NOTE — Progress Notes (Signed)
Patient ID: Mackenzie Weber, female   DOB: Jun 11, 1961, 55 y.o.   MRN: [id number]    Location:    [facility]   Place of Service:   OFFICE  Chief Complaint  Patient presents with  . Medical Management of Chronic Issues    OMM    HPI:  55 yo female seen today for f/u sciatica and LBP. Back pain improving. (+) left sciatic pain. No numbness but has tingling in left hand. She still has left knee pain and swelling. No loss of bowel/bladder control. No falls.  Obesity - she attempts to exercise on a regular basis but still has abdominal obesity with increasing abdominal girth. She believes she needs help.  improved LE swelling. She has not been sitting at the computer for long periods of time. She sporadically takes maxzide. She does takes hyzaar daily.  BP controlled on hyzaar and maxzide  She uses HRT due postmenopausal sx's  Asthma stable on symbicort and prn HFA  Past Medical History  Diagnosis Date  . Hypertension   . Asthma   . Seasonal allergies   . IBS (irritable bowel syndrome)   . Sciatica of left side   . Foot pain, right   . Anemia     Past Surgical History  Procedure Laterality Date  . Total abdominal hysterectomy w/ bilateral salpingoophorectomy  1989    LSO-1987; RsO-1998  . Knee surgery      right  . Appendectomy    . Cholecystectomy    . Tonsillectomy    . Wrist surgery      carpal tunnel repair  . Breast surgery  2001    /biopsy-benign  . Small bowel surgery    . Excisional hemorrhoidectomy    . Bladder suspension  2012    TVT  . Abdominal hysterectomy      Patient Care Team: Gildardo Cranker, MD as PCP - General  Social History   Social History  . Marital Status: Married    Spouse Name: N/A  . Number of Children: N/A  . Years of Education: N/A   Occupational History  . retired in 2015 from [employer].    Social History Main Topics  . Smoking status: Never Smoker   . Smokeless tobacco: Never Used  . Alcohol Use: No  . Drug Use: No  .  Sexual Activity:    Partners: Male    Birth Control/ Protection: Surgical   Other Topics Concern  . Not on file   Social History Narrative     reports that she has never smoked. She has never used smokeless tobacco. She reports that she does not drink alcohol or use illicit drugs.  Allergies  Allergen Reactions  . Cortisone     Red area around injection site x 72mh  . Depo-Medrol [Methylprednisolone Acetate] Nausea Only and Other (See Comments)    Dizziness  . Aspirin Anxiety and Other (See Comments)    Rapid Heart beat    Medications: Patient's Medications  New Prescriptions   No medications on file  Previous Medications   ALBUTEROL (PROVENTIL HFA;VENTOLIN HFA) 108 (90 BASE) MCG/ACT INHALER    Inhale 2 puffs into the lungs every 6 (six) hours as needed for wheezing or shortness of breath.   BUDESONIDE-FORMOTEROL (SYMBICORT) 80-4.5 MCG/ACT INHALER    Take 2 puffs first thing in am and then another 2 puffs about 12 hours later.   CHLORPHENIRAMINE-HYDROCODONE (TUSSIONEX PENNKINETIC ER) 10-8 MG/5ML SUER    Take 5  mLs by mouth 2 (two) times daily.   EPINEPHRINE (EPIPEN JR) 0.15 MG/0.3ML INJECTION    Inject 0.15 mg into the muscle daily as needed for anaphylaxis.    ESTRADIOL (ESTRACE) 0.1 MG/GM VAGINAL CREAM    Place 7.41 Applicatorfuls vaginally daily.   IPRATROPIUM (ATROVENT) 0.06 % NASAL SPRAY    Place 2 sprays into both nostrils 4 (four) times daily.   LOSARTAN-HYDROCHLOROTHIAZIDE (HYZAAR) 100-12.5 MG TABLET    Take 1 tablet by mouth daily.   MELOXICAM (MOBIC) 7.5 MG TABLET    Take 1 tablet (7.5 mg total) by mouth daily. May take up to 2 tabs po daily prn severe pain   METHOCARBAMOL (ROBAXIN) 750 MG TABLET    Take 750 mg by mouth every 8 (eight) hours as needed for muscle spasms.   MULTIPLE VITAMIN (MULTIVITAMIN) CAPSULE    Take 1 capsule by mouth daily.   TRIAMTERENE-HYDROCHLOROTHIAZIDE (MAXZIDE) 75-50 MG PER TABLET    Take 0.5 tablets by mouth daily.   Modified Medications    No medications on file  Discontinued Medications   No medications on file    Review of Systems  Cardiovascular: Positive for leg swelling.  Musculoskeletal: Positive for back pain, joint swelling, arthralgias, gait problem and neck pain.  Neurological: Negative for numbness.  All other systems reviewed and are negative.   Filed Vitals:   12/30/15 1301  BP: 108/72  Pulse: 97  Temp: 97.7 F (36.5 C)  TempSrc: Oral  Resp: 18  Height: 5' (1.524 m)  Weight: 225 lb 12.8 oz (102.422 kg)  SpO2: 98%   Body mass index is 44.1 kg/(m^2).  Physical Exam  Constitutional: She is oriented to person, place, and time. She appears well-developed and well-nourished.    Neck: Neck supple.  Musculoskeletal: She exhibits edema and tenderness.  (+) left standing flexion test; left pelvic inflare with SI joint restriction and ASIS TTP; left knee swelling with reduced ROM and TTP at proximal fibular head; sacral torsion; right short leg; left greater trochanteric TTP with iliotibial band hypertrophy and TTP; OA extended; paravertebral lumbar, thoracic and cervical muscle hypertrophy with ropy tissue texture changes; reduced ROM; gait antalgic; trace distal LE edema  Neurological: She is alert and oriented to person, place, and time.  Skin: Skin is warm and dry. No rash noted.  Psychiatric: She has a normal mood and affect. Her behavior is normal. Judgment and thought content normal.     Labs reviewed: No visits with results within 3 Month(s) from this visit. Latest known visit with results is:  Admission on 07/31/2015, Discharged on 07/31/2015  Component Date Value Ref Range Status  . WBC 07/31/2015 6.8  4.0 - 10.5 K/uL Final  . RBC 07/31/2015 5.34* 3.87 - 5.11 MIL/uL Final  . Hemoglobin 07/31/2015 10.7* 12.0 - 15.0 g/dL Final  . HCT 07/31/2015 33.9* 36.0 - 46.0 % Final  . MCV 07/31/2015 63.5* 78.0 - 100.0 fL Final  . MCH 07/31/2015 20.0* 26.0 - 34.0 pg Final  . MCHC 07/31/2015 31.6  30.0 -  36.0 g/dL Final  . RDW 07/31/2015 17.4* 11.5 - 15.5 % Final  . Platelets 07/31/2015 351  150 - 400 K/uL Final  . Neutrophils Relative % 07/31/2015 51   Final  . Lymphocytes Relative 07/31/2015 39   Final  . Monocytes Relative 07/31/2015 8   Final  . Eosinophils Relative 07/31/2015 2   Final  . Basophils Relative 07/31/2015 0   Final  . Neutro Abs 07/31/2015 3.5  1.7 - 7.7  K/uL Final  . Lymphs Abs 07/31/2015 2.7  0.7 - 4.0 K/uL Final  . Monocytes Absolute 07/31/2015 0.5  0.1 - 1.0 K/uL Final  . Eosinophils Absolute 07/31/2015 0.1  0.0 - 0.7 K/uL Final  . Basophils Absolute 07/31/2015 0.0  0.0 - 0.1 K/uL Final  . RBC Morphology 07/31/2015 TARGET CELLS   Final   Comment: POLYCHROMASIA PRESENT ELLIPTOCYTES   . Sodium 07/31/2015 141  135 - 145 mmol/L Final  . Potassium 07/31/2015 3.3* 3.5 - 5.1 mmol/L Final  . Chloride 07/31/2015 106  101 - 111 mmol/L Final  . CO2 07/31/2015 28  22 - 32 mmol/L Final  . Glucose, Bld 07/31/2015 113* 65 - 99 mg/dL Final  . BUN 07/31/2015 17  6 - 20 mg/dL Final  . Creatinine, Ser 07/31/2015 0.88  0.44 - 1.00 mg/dL Final  . Calcium 07/31/2015 9.9  8.9 - 10.3 mg/dL Final  . GFR calc non Af Amer 07/31/2015 >60  >60 mL/min Final  . GFR calc Af Amer 07/31/2015 >60  >60 mL/min Final   Comment: (NOTE) The eGFR has been calculated using the CKD EPI equation. This calculation has not been validated in all clinical situations. eGFR's persistently <60 mL/min signify possible Chronic Kidney Disease.   . Anion gap 07/31/2015 7  5 - 15 Final  . Troponin I 07/31/2015 <0.03  <0.031 ng/mL Final   Comment:        NO INDICATION OF MYOCARDIAL INJURY.   Marland Kitchen D-Dimer, Quant 07/31/2015 <0.27  0.00 - 0.48 ug/mL-FEU Final   Comment:        AT THE INHOUSE ESTABLISHED CUTOFF VALUE OF 0.48 ug/mL FEU, THIS ASSAY HAS BEEN DOCUMENTED IN THE LITERATURE TO HAVE A SENSITIVITY AND NEGATIVE PREDICTIVE VALUE OF AT LEAST 98 TO 99%.  THE TEST RESULT SHOULD BE CORRELATED WITH AN  ASSESSMENT OF THE CLINICAL PROBABILITY OF DVT / VTE.     No results found.   Assessment/Plan   ICD-9-CM ICD-10-CM   1. Morbid obesity due to excess calories (HCC) 278.01 E66.01 Lorcaserin HCl (BELVIQ) 10 MG TABS  2. Left leg pain 729.5 M79.605 ketorolac (TORADOL) injection 30 mg     ketorolac (TORADOL) 30 MG/ML injection 30 mg  3. Muscle spasm of left lower extremity 728.85 M62.838   4. Sciatica of left side 724.3 M54.32   5. Somatic dysfunction of lower extremity 739.6 M99.06   6. Somatic dysfunction of lumbar region 739.3 M99.03   7. Somatic dysfunction of pelvic region 739.5 M99.05   8. Somatic dysfunction of sacral region 739.4 M99.04   9. Somatic dysfunction of cervical region 739.1 M99.01     PROCEDURE NOTE:  After verbal consent obtained, OMT utilized with improvement in ROM, tissue texture, asymmetry and tenderness. Pt tolerated procedure well.  OMT Treatment 12/30/2015 12/02/2015 11/16/2015 10/26/2015 09/16/2015 08/24/2015 07/20/2015  Head/Face - - - - CR;MFR;ST;IND/INR - -  Head/Face Response - - - - I - -  Neck  CS;IND/INR;MFR;ST;DIR CS;IND/INR;MFR;ST;DIR CS;IND/INR;MFR;ST;ME;DIR CS;IND/INR;MFR;ST CS;IND/INR;MFR;ST - CS;IND/INR;MFR;ST;ME  Neck Response I I I I I - I  T1-T4  - - LAS;MFR;ST;IND/INR - - - -  T1-T4 Response - - I - - - -  T-5-T9  - - LAS;MFR;ST;IND/INR - - LAS;MFR;ST;IND/INR -  T5-T9 Response - - I - - I -  Ribs  - - - - - IND/INR;LAS;MFR;ST -  Ribs Response - - - - - I -  Lumbar IND/INR;LAS;MFR;ST IND/INR;LAS;MFR;ST IND/INR;LAS;MFR;ST IND/INR;LAS;MFR;ST IND/INR;LAS;MFR;ST IND/INR;LAS;MFR;ST IND/INR;LAS;MFR;ST  Lumbar Response I I I I I  I I  Sacrum IND/INR;MFR;ST IND/INR;MFR;ST IND/INR;MFR;ST IND/INR;MFR;ST IND/INR;MFR;ST IND/INR;MFR;ST IND/INR;MFR;ST  Sacrum Response I I I I I I I  Pelvis LAS;MFR;ST;IND/INR;ME LAS;MFR;ST;IND/INR;ME LAS;MFR;ST;IND/INR LAS;MFR;ST;IND/INR LAS;MFR;ST;IND/INR LAS;MFR;ST;IND/INR LAS;MFR;ST;IND/INR;ME  Pelvis Response I I I I I I I   Lower Ext. LAS;MFR;ST;IND/INR;DIR LAS;MFR;ST;IND/INR;DIR LAS;MFR;ST;IND/INR;DIR LAS;MFR;ST;IND/INR;DIR LAS;MFR;ST;IND/INR;DIR LAS;MFR;ST;IND/INR;DIR LAS;MFR;ST;IND/INR;DIR  Lower Ext. Response I I I I I I I    Push fluids and rest today. No heavy lifting. May resume nml activity in the AM.  Take belviq 2 times daily for weight loss. Medication education handout given  Toradol injection given today  Apply ice as needed to left knee  Follow up in 4 weeks for OMM  [facility] S. Perlie Gold  [facility] [address] [place], [place] [address] [phone] Cell (Monday-Friday 8 AM - 5 PM) [phone] After 5 PM and follow prompts

## 2015-12-30 NOTE — Patient Instructions (Signed)
Push fluids and rest today. No heavy lifting. May resume nml activity in the AM.  Take belviq 2 times daily for weight loss  Toradol injection given today  Apply ice as needed to left knee  Follow up in 4 weeks for OMM  Lorcaserin oral tablets What is this medicine? LORCASERIN (lor ca SER in) is used to promote and maintain weight loss in obese patients. This medicine should be used with a reduced calorie diet and, if appropriate, an exercise program. This medicine may be used for other purposes; ask your health care provider or pharmacist if you have questions. What should I tell my health care provider before I take this medicine? They need to know if you have any of these conditions: -anatomical deformation of the penis, Peyronie's disease, or history of priapism (painful and prolonged erection) -diabetes -heart disease -history of blood diseases, like sickle cell anemia or leukemia -history of irregular heartbeat -kidney disease -liver disease -suicidal thoughts, plans, or attempt; a previous suicide attempt by you or a family member -an unusual or allergic reaction to lorcaserin, other medicines, foods, dyes, or preservatives -pregnant or trying to get pregnant -breast-feeding How should I use this medicine? Take this medicine by mouth with a glass of water. Follow the directions on the prescription label. You can take it with or without food. Take your medicine at regular intervals. Do not take it more often than directed. Do not stop taking except on your doctor's advice. Talk to your pediatrician regarding the use of this medicine in children. Special care may be needed. Overdosage: If you think you have taken too much of this medicine contact a poison control center or emergency room at once. NOTE: This medicine is only for you. Do not share this medicine with others. What if I miss a dose? If you miss a dose, take it as soon as you can. If it is almost time for your next  dose, take only that dose. Do not take double or extra doses. What may interact with this medicine? -cabergoline -certain medicines for depression, anxiety, or psychotic disturbances -certain medicines for erectile dysfunction -certain medicines for migraine headache like almotriptan, eletriptan, frovatriptan, naratriptan, rizatriptan, sumatriptan, zolmitriptan -dextromethorphan -linezolid -lithium -medicines for diabetes -other weight loss products -tramadol -St. John's Wort -stimulant medicines for attention disorders, weight loss, or to stay awake -tryptophan This list may not describe all possible interactions. Give your health care provider a list of all the medicines, herbs, non-prescription drugs, or dietary supplements you use. Also tell them if you smoke, drink alcohol, or use illegal drugs. Some items may interact with your medicine. What should I watch for while using this medicine? This medicine is intended to be used in addition to a healthy diet and appropriate exercise. The best results are achieved this way. Your doctor should instruct you to stop taking this medicine if you do not lose a certain amount of weight within the first 12 weeks of treatment, but it is important that you do not change your dose in any way without consulting your doctor or health care professional. Visit your doctor or health care professional for regular checkups. Your doctor may order blood tests or other tests to see how you are doing. Do not drive, use machinery, or do anything that needs mental alertness until you know how this medicine affects you. This medicine may affect blood sugar levels. If you have diabetes, check with your doctor or health care professional before you change your diet  or the dose of your diabetic medicine. Patients and their families should watch out for worsening depression or thoughts of suicide. Also watch out for sudden changes in feelings such as feeling anxious,  agitated, panicky, irritable, hostile, aggressive, impulsive, severely restless, overly excited and hyperactive, or not being able to sleep. If this happens, especially at the beginning of treatment or after a change in dose, call your health care professional. Contact your doctor or health care professional right away if you are a man with an erection that lasts longer than 4 hours or if the erection becomes painful. This may be a sign of serious problem and must be treated right away to prevent permanent damage. What side effects may I notice from receiving this medicine? Side effects that you should report to your doctor or health care professional as soon as possible: -allergic reactions like skin rash, itching or hives, swelling of the face, lips, or tongue -abnormal production of milk -breast enlargement in both males and females -breathing problems -changes in emotions or moods -changes in vision -confusion -erection lasting more than 4 hours or a painful erection -fast or irregular heart beat -feeling faint or lightheaded, falls -fever or chills, sore throat -hallucination, loss of contact with reality -high or low blood pressure -menstrual changes -restlessness -slow or irregular heartbeat -stiff muscles -sweating -suicidal thoughts or other mood changes -swelling of the ankles, feet, hands -unusually weak or tired -vomiting Side effects that usually do not require medical attention (Report these to your doctor or health care professional if they continue or are bothersome.): -back pain -constipation -cough -dry mouth -nausea -tiredness This list may not describe all possible side effects. Call your doctor for medical advice about side effects. You may report side effects to FDA at 1-800-FDA-1088. Where should I keep my medicine? Keep out of the reach of children. This medicine can be abused. Keep your medicine in a safe place to protect it from theft. Do not share this  medicine with anyone. Selling or giving away this medicine is dangerous and against the law. Store at room temperature between 15 and 30 degrees C (59 and 86 degrees F). Throw away any unused medicine after the expiration date. NOTE: This sheet is a summary. It may not cover all possible information. If you have questions about this medicine, talk to your doctor, pharmacist, or health care provider.    2016, Elsevier/Gold Standard. (2015-05-09 16:21:05)

## 2016-01-05 ENCOUNTER — Telehealth: Payer: Self-pay

## 2016-01-05 NOTE — Telephone Encounter (Signed)
Prior authorization was received for Belviq 10 mg tablets. Prior authorization was initiated via covermymeds.com, keyword: [id number].  Awaiting determination.

## 2016-01-07 ENCOUNTER — Other Ambulatory Visit: Payer: Self-pay | Admitting: Internal Medicine

## 2016-01-07 MED ORDER — CEFDINIR 300 MG PO CAPS: 300.0000 mg | ORAL_CAPSULE | Freq: Two times a day (BID) | ORAL | Status: DC

## 2016-01-13 NOTE — Telephone Encounter (Signed)
Received fax from [facility] [phone] and Melynda Keller was DENIED. Given to Dr. Eulas Post to review.  Member ID#: [id number] Certification Number: [id number]

## 2016-01-20 NOTE — Telephone Encounter (Signed)
Received fax from [facility] and medication was APPROVED 01/15/2016-04/15/2016. Member ID#: [id number] Certificate #: [id number]

## 2016-01-25 ENCOUNTER — Encounter: Payer: BC Managed Care – PPO | Admitting: Internal Medicine

## 2016-02-02 ENCOUNTER — Other Ambulatory Visit: Payer: Self-pay | Admitting: Internal Medicine

## 2016-02-17 ENCOUNTER — Ambulatory Visit (INDEPENDENT_AMBULATORY_CARE_PROVIDER_SITE_OTHER): Payer: BC Managed Care – PPO | Admitting: Internal Medicine

## 2016-02-17 ENCOUNTER — Encounter: Payer: Self-pay | Admitting: Internal Medicine

## 2016-02-17 VITALS — BP 120/62 | HR 88 | Temp 98.0°F | Resp 20 | Ht 60.0 in | Wt 224.6 lb

## 2016-02-17 DIAGNOSIS — M9904 Segmental and somatic dysfunction of sacral region: Secondary | ICD-10-CM

## 2016-02-17 DIAGNOSIS — M9906 Segmental and somatic dysfunction of lower extremity: Secondary | ICD-10-CM

## 2016-02-17 DIAGNOSIS — M62838 Other muscle spasm: Secondary | ICD-10-CM | POA: Diagnosis not present

## 2016-02-17 DIAGNOSIS — M5432 Sciatica, left side: Secondary | ICD-10-CM

## 2016-02-17 DIAGNOSIS — M9905 Segmental and somatic dysfunction of pelvic region: Secondary | ICD-10-CM

## 2016-02-17 DIAGNOSIS — M9903 Segmental and somatic dysfunction of lumbar region: Secondary | ICD-10-CM

## 2016-02-17 MED ORDER — KETOROLAC TROMETHAMINE 30 MG/ML IJ SOLN
60.0000 mg | Freq: Once | INTRAMUSCULAR | Status: AC
  Administered 2016-02-17: 60 mg via INTRAMUSCULAR

## 2016-02-17 NOTE — Patient Instructions (Addendum)
Push fluids and rest today. No heavy lifting. May resume nml activity in the AM.  May apply warm compresses and alternate with cool compresses as needed for pain  toradol 60mg  injection given today  Start belviq as ordered for weight management  Continue other medications as ordered  Follow up in 4 weeks for OMM

## 2016-02-17 NOTE — Progress Notes (Signed)
Patient ID: Mackenzie Weber, female   DOB: 1961/02/22, 55 y.o.   MRN: [id number]    Location:    [facility]   Place of Service:   OFFICE  Chief Complaint  Patient presents with  . Medical Management of Chronic Issues    OMM Tx    HPI:  55 yo female seen today for f/u sciatica and LBP. Back pain stable. (+) left sciatic pain. No numbness but has tingling in left hand. She still has left knee pain and swelling. She states left thigh cramp. No loss of bowel/bladder control. No falls.  She was dx with influenza at the end of March. She was tx with nebs, abx and steroid at urgent care. She is feeling better now.   Obesity - she attempts to exercise on a regular basis but still has abdominal obesity with increasing abdominal girth. She believes she needs help.  improved LE swelling. She has not been sitting at the computer for long periods of time. She sporadically takes maxzide. She does takes hyzaar daily.  BP controlled on hyzaar and maxzide  She uses HRT due postmenopausal sx's  Asthma stable on symbicort and prn HFA  Past Medical History  Diagnosis Date  . Hypertension   . Asthma   . Seasonal allergies   . IBS (irritable bowel syndrome)   . Sciatica of left side   . Foot pain, right   . Anemia     Past Surgical History  Procedure Laterality Date  . Total abdominal hysterectomy w/ bilateral salpingoophorectomy  1989    LSO-1987; RsO-1998  . Knee surgery      right  . Appendectomy    . Cholecystectomy    . Tonsillectomy    . Wrist surgery      carpal tunnel repair  . Breast surgery  2001    /biopsy-benign  . Small bowel surgery    . Excisional hemorrhoidectomy    . Bladder suspension  2012    TVT  . Abdominal hysterectomy      Patient Care Team: Gildardo Cranker, MD as PCP - General  Social History   Social History  . Marital Status: Married    Spouse Name: N/A  . Number of Children: N/A  . Years of Education: N/A   Occupational History  . retired in 2015 from  [employer].    Social History Main Topics  . Smoking status: Never Smoker   . Smokeless tobacco: Never Used  . Alcohol Use: No  . Drug Use: No  . Sexual Activity:    Partners: Male    Birth Control/ Protection: Surgical   Other Topics Concern  . Not on file   Social History Narrative     reports that she has never smoked. She has never used smokeless tobacco. She reports that she does not drink alcohol or use illicit drugs.  Allergies  Allergen Reactions  . Cortisone     Red area around injection site x 16mh  . Depo-Medrol [Methylprednisolone Acetate] Nausea Only and Other (See Comments)    Dizziness  . Aspirin Anxiety and Other (See Comments)    Rapid Heart beat    Medications: Patient's Medications  New Prescriptions   No medications on file  Previous Medications   ALBUTEROL (PROVENTIL HFA;VENTOLIN HFA) 108 (90 BASE) MCG/ACT INHALER    Inhale 2 puffs into the lungs every 6 (six) hours as needed for wheezing or shortness of breath.   BUDESONIDE-FORMOTEROL (SYMBICORT) 80-4.5 MCG/ACT  INHALER    Take 2 puffs first thing in am and then another 2 puffs about 12 hours later.   CHLORPHENIRAMINE-HYDROCODONE (TUSSIONEX PENNKINETIC ER) 10-8 MG/5ML SUER    Take 5 mLs by mouth 2 (two) times daily.   EPINEPHRINE (EPIPEN JR) 0.15 MG/0.3ML INJECTION    Inject 0.15 mg into the muscle daily as needed for anaphylaxis.    ESTRADIOL (ESTRACE) 0.1 MG/GM VAGINAL CREAM    Place 6.21 Applicatorfuls vaginally daily.   IPRATROPIUM (ATROVENT) 0.06 % NASAL SPRAY    Place 2 sprays into both nostrils 4 (four) times daily.   LORCASERIN HCL (BELVIQ) 10 MG TABS    Take 1 tab po BID for weight management   LOSARTAN-HYDROCHLOROTHIAZIDE (HYZAAR) 100-12.5 MG TABLET    TAKE 1 TABLET BY MOUTH EVERY DAY   MELOXICAM (MOBIC) 7.5 MG TABLET    Take 1 tablet (7.5 mg total) by mouth daily. May take up to 2 tabs po daily prn severe pain   METHOCARBAMOL (ROBAXIN) 750 MG TABLET    Take 750 mg by mouth every 8  (eight) hours as needed for muscle spasms.   MULTIPLE VITAMIN (MULTIVITAMIN) CAPSULE    Take 1 capsule by mouth daily.   TRIAMTERENE-HYDROCHLOROTHIAZIDE (MAXZIDE) 75-50 MG PER TABLET    Take 0.5 tablets by mouth daily.   Modified Medications   No medications on file  Discontinued Medications   CEFDINIR (OMNICEF) 300 MG CAPSULE    Take 1 capsule (300 mg total) by mouth 2 (two) times daily.    Review of Systems  Respiratory: Positive for cough.   Cardiovascular: Positive for leg swelling.  Musculoskeletal: Positive for joint swelling, arthralgias and gait problem.  All other systems reviewed and are negative.   Filed Vitals:   02/17/16 1300  BP: 120/62  Pulse: 88  Temp: 98 F (36.7 C)  TempSrc: Oral  Resp: 20  Height: 5' (1.524 m)  Weight: 224 lb 9.6 oz (101.878 kg)  SpO2: 96%   Body mass index is 43.86 kg/(m^2).  Physical Exam  Constitutional: She is oriented to person, place, and time. She appears well-developed and well-nourished.    Musculoskeletal: She exhibits edema and tenderness.  (+) standing flexion on left; short right leg; sacral torsion; left pelvic inflare with SI joint restriction and ASIS TTP; increased lumbar lordosis; paravertebral lumbar muscle hypertrophy with ropy tissue texture changes; left knee swelling anterior and posterior with popliteal fossa TTP; reduced ROM tibia noted; reduced ROM of knee in flexion >extension; neg drawer test; strength intact; trace LE swelling  Neurological: She is alert and oriented to person, place, and time.  Skin: Skin is warm and dry. No rash noted.  Psychiatric: She has a normal mood and affect. Her behavior is normal. Thought content normal.     Labs reviewed: No visits with results within 3 Month(s) from this visit. Latest known visit with results is:  Admission on 07/31/2015, Discharged on 07/31/2015  Component Date Value Ref Range Status  . WBC 07/31/2015 6.8  4.0 - 10.5 K/uL Final  . RBC 07/31/2015 5.34* 3.87  - 5.11 MIL/uL Final  . Hemoglobin 07/31/2015 10.7* 12.0 - 15.0 g/dL Final  . HCT 07/31/2015 33.9* 36.0 - 46.0 % Final  . MCV 07/31/2015 63.5* 78.0 - 100.0 fL Final  . MCH 07/31/2015 20.0* 26.0 - 34.0 pg Final  . MCHC 07/31/2015 31.6  30.0 - 36.0 g/dL Final  . RDW 07/31/2015 17.4* 11.5 - 15.5 % Final  . Platelets 07/31/2015 351  150 - 400  K/uL Final  . Neutrophils Relative % 07/31/2015 51   Final  . Lymphocytes Relative 07/31/2015 39   Final  . Monocytes Relative 07/31/2015 8   Final  . Eosinophils Relative 07/31/2015 2   Final  . Basophils Relative 07/31/2015 0   Final  . Neutro Abs 07/31/2015 3.5  1.7 - 7.7 K/uL Final  . Lymphs Abs 07/31/2015 2.7  0.7 - 4.0 K/uL Final  . Monocytes Absolute 07/31/2015 0.5  0.1 - 1.0 K/uL Final  . Eosinophils Absolute 07/31/2015 0.1  0.0 - 0.7 K/uL Final  . Basophils Absolute 07/31/2015 0.0  0.0 - 0.1 K/uL Final  . RBC Morphology 07/31/2015 TARGET CELLS   Final   Comment: POLYCHROMASIA PRESENT ELLIPTOCYTES   . Sodium 07/31/2015 141  135 - 145 mmol/L Final  . Potassium 07/31/2015 3.3* 3.5 - 5.1 mmol/L Final  . Chloride 07/31/2015 106  101 - 111 mmol/L Final  . CO2 07/31/2015 28  22 - 32 mmol/L Final  . Glucose, Bld 07/31/2015 113* 65 - 99 mg/dL Final  . BUN 07/31/2015 17  6 - 20 mg/dL Final  . Creatinine, Ser 07/31/2015 0.88  0.44 - 1.00 mg/dL Final  . Calcium 07/31/2015 9.9  8.9 - 10.3 mg/dL Final  . GFR calc non Af Amer 07/31/2015 >60  >60 mL/min Final  . GFR calc Af Amer 07/31/2015 >60  >60 mL/min Final   Comment: (NOTE) The eGFR has been calculated using the CKD EPI equation. This calculation has not been validated in all clinical situations. eGFR's persistently <60 mL/min signify possible Chronic Kidney Disease.   . Anion gap 07/31/2015 7  5 - 15 Final  . Troponin I 07/31/2015 <0.03  <0.031 ng/mL Final   Comment:        NO INDICATION OF MYOCARDIAL INJURY.   Marland Kitchen D-Dimer, Quant 07/31/2015 <0.27  0.00 - 0.48 ug/mL-FEU Final   Comment:         AT THE INHOUSE ESTABLISHED CUTOFF VALUE OF 0.48 ug/mL FEU, THIS ASSAY HAS BEEN DOCUMENTED IN THE LITERATURE TO HAVE A SENSITIVITY AND NEGATIVE PREDICTIVE VALUE OF AT LEAST 98 TO 99%.  THE TEST RESULT SHOULD BE CORRELATED WITH AN ASSESSMENT OF THE CLINICAL PROBABILITY OF DVT / VTE.     No results found.   Assessment/Plan   ICD-9-CM ICD-10-CM   1. Sciatica of left side 724.3 M54.32 ketorolac (TORADOL) 30 MG/ML injection 60 mg  2. Muscle spasm of left lower extremity 728.85 M62.838   3. Somatic dysfunction of lower extremity 739.6 M99.06   4. Somatic dysfunction of lumbar region 739.3 M99.03   5. Somatic dysfunction of pelvic region 739.5 M99.05   6. Somatic dysfunction of sacral region 739.4 M99.04   7. Morbid obesity due to excess calories ([place]) 278.01 E66.01     PROCEDURE NOTE:  After verbal consent obtained, OMT utilized with improvement in ROM, tissue texture, asymmetry and tenderness. Pt tolerated procedure well.  OMT Treatment 02/17/2016 12/30/2015 12/02/2015 11/16/2015 10/26/2015 09/16/2015 08/24/2015  Head/Face - - - - - CR;MFR;ST;IND/INR -  Head/Face Response - - - - - I -  Neck  - CS;IND/INR;MFR;ST;DIR CS;IND/INR;MFR;ST;DIR CS;IND/INR;MFR;ST;ME;DIR CS;IND/INR;MFR;ST CS;IND/INR;MFR;ST -  Neck Response - I I I I I -  T1-T4  - - - LAS;MFR;ST;IND/INR - - -  T1-T4 Response - - - I - - -  T-5-T9  - - - LAS;MFR;ST;IND/INR - - LAS;MFR;ST;IND/INR  T5-T9 Response - - - I - - I  Ribs  - - - - - - IND/INR;LAS;MFR;ST  Ribs Response - - - - - - I  Lumbar IND/INR;LAS;MFR;ST IND/INR;LAS;MFR;ST IND/INR;LAS;MFR;ST IND/INR;LAS;MFR;ST IND/INR;LAS;MFR;ST IND/INR;LAS;MFR;ST IND/INR;LAS;MFR;ST  Lumbar Response I I I I I I I  Sacrum IND/INR;MFR;ST IND/INR;MFR;ST IND/INR;MFR;ST IND/INR;MFR;ST IND/INR;MFR;ST IND/INR;MFR;ST IND/INR;MFR;ST  Sacrum Response I I I I I I I  Pelvis LAS;MFR;ST;IND/INR LAS;MFR;ST;IND/INR;ME LAS;MFR;ST;IND/INR;ME LAS;MFR;ST;IND/INR LAS;MFR;ST;IND/INR LAS;MFR;ST;IND/INR  LAS;MFR;ST;IND/INR  Pelvis Response I I I I I I I  Lower Ext. LAS;MFR;ST;IND/INR;DIR LAS;MFR;ST;IND/INR;DIR LAS;MFR;ST;IND/INR;DIR LAS;MFR;ST;IND/INR;DIR LAS;MFR;ST;IND/INR;DIR LAS;MFR;ST;IND/INR;DIR LAS;MFR;ST;IND/INR;DIR  Lower Ext. Response I I I I I I I    Push fluids and rest today. No heavy lifting. May resume nml activity in the AM.  May apply warm compresses and alternate with cool compresses as needed for pain  toradol 51m injection given today  Start belviq as ordered for weight management  Continue other medications as ordered  F/u with Ortho for knee pain and swelling  Follow up in 4 weeks for OMM  MHoly Cross HospitalS. CPerlie Gold PGreater Regional Medical Centerand Adult Medicine [address]GO[address] [place] [phone] (Monday-Friday 8 AM - 5 PM) ([phone] 5 PM and follow prompts

## 2016-03-01 ENCOUNTER — Other Ambulatory Visit: Payer: Self-pay | Admitting: Internal Medicine

## 2016-03-23 ENCOUNTER — Encounter: Payer: Self-pay | Admitting: Internal Medicine

## 2016-03-23 ENCOUNTER — Ambulatory Visit (INDEPENDENT_AMBULATORY_CARE_PROVIDER_SITE_OTHER): Payer: BC Managed Care – PPO | Admitting: Internal Medicine

## 2016-03-23 VITALS — BP 118/74 | HR 67 | Temp 97.8°F | Ht 60.0 in | Wt 224.4 lb

## 2016-03-23 DIAGNOSIS — J45909 Unspecified asthma, uncomplicated: Secondary | ICD-10-CM | POA: Diagnosis not present

## 2016-03-23 DIAGNOSIS — L259 Unspecified contact dermatitis, unspecified cause: Secondary | ICD-10-CM | POA: Diagnosis not present

## 2016-03-23 DIAGNOSIS — M9905 Segmental and somatic dysfunction of pelvic region: Secondary | ICD-10-CM | POA: Diagnosis not present

## 2016-03-23 DIAGNOSIS — M5432 Sciatica, left side: Secondary | ICD-10-CM | POA: Diagnosis not present

## 2016-03-23 DIAGNOSIS — M9904 Segmental and somatic dysfunction of sacral region: Secondary | ICD-10-CM | POA: Diagnosis not present

## 2016-03-23 DIAGNOSIS — M9903 Segmental and somatic dysfunction of lumbar region: Secondary | ICD-10-CM

## 2016-03-23 DIAGNOSIS — M9906 Segmental and somatic dysfunction of lower extremity: Secondary | ICD-10-CM | POA: Diagnosis not present

## 2016-03-23 DIAGNOSIS — I10 Essential (primary) hypertension: Secondary | ICD-10-CM

## 2016-03-23 DIAGNOSIS — M25562 Pain in left knee: Secondary | ICD-10-CM | POA: Diagnosis not present

## 2016-03-23 DIAGNOSIS — M62838 Other muscle spasm: Secondary | ICD-10-CM | POA: Diagnosis not present

## 2016-03-23 MED ORDER — TRIAMCINOLONE ACETONIDE 0.5 % EX CREA: 1.0000 "application " | TOPICAL_CREAM | Freq: Two times a day (BID) | CUTANEOUS | Status: DC

## 2016-03-23 NOTE — Patient Instructions (Addendum)
Push fluids and rest today. No heavy lifting. May resume nml activity in the AM.  Recommend accupuncture, increase exercise, and weight loss for knee pain  Recommend follow up with Ortho  Continue current medications as ordered  Follow up in 4 weeks for OMM

## 2016-03-23 NOTE — Progress Notes (Signed)
Patient ID: Mackenzie Weber, female   DOB: 1961/05/04, 55 y.o.   MRN: [id number]    Location:  [facility] Place of Service: OFFICE  Chief Complaint  Patient presents with  . Other    OMM    HPI:  55 yo female seen today for f/u sciatica and LBP. Back pain overall improved. (+) left sciatic pain. No numbness but has tingling in left hand. She still has left knee pain and swelling. She saw Ortho and was told she did not have enough fluid in knee to drain. She was Rx topical antiinflammatory. She states left thigh cramp. No loss of bowel/bladder control. No falls.  She traveled to [place] by plane since last visit. No significant issues with knee.  Obesity - she attempts to exercise on a regular basis but still has abdominal obesity with increasing abdominal girth. Her weight is down 3 oz since last OV. She is taking belviq. She has an appt next week to see GI for increased abdominal girth.  improved LE swelling. She has not been sitting at the computer for long periods of time. She sporadically takes maxzide. She does takes hyzaar daily.  BP controlled on hyzaar and maxzide  She uses HRT due postmenopausal sx's  Asthma stable on symbicort and prn HFA  Past Medical History  Diagnosis Date  . Hypertension   . Asthma   . Seasonal allergies   . IBS (irritable bowel syndrome)   . Sciatica of left side   . Foot pain, right   . Anemia     Past Surgical History  Procedure Laterality Date  . Total abdominal hysterectomy w/ bilateral salpingoophorectomy  1989    LSO-1987; RsO-1998  . Knee surgery      right  . Appendectomy    . Cholecystectomy    . Tonsillectomy    . Wrist surgery      carpal tunnel repair  . Breast surgery  2001    /biopsy-benign  . Small bowel surgery    . Excisional hemorrhoidectomy    . Bladder suspension  2012    TVT  . Abdominal hysterectomy      Patient Care Team: Gildardo Cranker, MD as PCP - General  Social History   Social History  . Marital Status:  Married    Spouse Name: N/A  . Number of Children: N/A  . Years of Education: N/A   Occupational History  . retired in 2015 from [employer].    Social History Main Topics  . Smoking status: Never Smoker   . Smokeless tobacco: Never Used  . Alcohol Use: No  . Drug Use: No  . Sexual Activity:    Partners: Male    Birth Control/ Protection: Surgical   Other Topics Concern  . Not on file   Social History Narrative     reports that she has never smoked. She has never used smokeless tobacco. She reports that she does not drink alcohol or use illicit drugs.  Family History  Problem Relation Age of Onset  . Diabetes Father   . Hypertension Father   . Diabetes Mother   . Hypertension Mother   . Asthma Mother   . Diabetes Brother   . Hypertension Sister   . Diabetes Sister   . Cancer Sister 71    breast  . Hypertension Paternal Uncle    Family Status  Relation Status Death Age  . Father Deceased 79    Heart Attack  .  Mother Deceased 79    Heart Attack  . Brother Alive   . Sister Alive   . Sister Alive   . Sister Alive   . Sister Alive   . Brother Alive      Allergies  Allergen Reactions  . Cortisone     Red area around injection site x 20mh  . Depo-Medrol [Methylprednisolone Acetate] Nausea Only and Other (See Comments)    Dizziness  . Aspirin Anxiety and Other (See Comments)    Rapid Heart beat    Medications: Patient's Medications  New Prescriptions   No medications on file  Previous Medications   ALBUTEROL (PROVENTIL HFA;VENTOLIN HFA) 108 (90 BASE) MCG/ACT INHALER    Inhale 2 puffs into the lungs every 6 (six) hours as needed for wheezing or shortness of breath.   BUDESONIDE-FORMOTEROL (SYMBICORT) 80-4.5 MCG/ACT INHALER    Take 2 puffs first thing in am and then another 2 puffs about 12 hours later.   CHLORPHENIRAMINE-HYDROCODONE (TUSSIONEX PENNKINETIC ER) 10-8 MG/5ML SUER    Take 5 mLs by mouth 2 (two) times daily.   EPINEPHRINE (EPIPEN JR) 0.15  MG/0.3ML INJECTION    Inject 0.15 mg into the muscle daily as needed for anaphylaxis.    ESTRADIOL (ESTRACE) 0.1 MG/GM VAGINAL CREAM    Place 05.99Applicatorfuls vaginally daily.   IPRATROPIUM (ATROVENT) 0.06 % NASAL SPRAY    Place 2 sprays into both nostrils 4 (four) times daily.   LORCASERIN HCL (BELVIQ) 10 MG TABS    Take 1 tab po BID for weight management   LOSARTAN-HYDROCHLOROTHIAZIDE (HYZAAR) 100-12.5 MG TABLET    TAKE 1 TABLET BY MOUTH EVERY DAY   MELOXICAM (MOBIC) 7.5 MG TABLET    Take 1 tablet (7.5 mg total) by mouth daily. May take up to 2 tabs po daily prn severe pain   METHOCARBAMOL (ROBAXIN) 750 MG TABLET    Take 750 mg by mouth every 8 (eight) hours as needed for muscle spasms.   MULTIPLE VITAMIN (MULTIVITAMIN) CAPSULE    Take 1 capsule by mouth daily.   TRIAMTERENE-HYDROCHLOROTHIAZIDE (MAXZIDE) 75-50 MG TABLET    TAKE 1 TABLET BY MOUTH EVERY DAY  Modified Medications   No medications on file  Discontinued Medications   No medications on file    Review of Systems  Respiratory: Positive for cough.   Cardiovascular: Positive for leg swelling.  Musculoskeletal: Positive for joint swelling, arthralgias and gait problem.  All other systems reviewed and are negative.   Filed Vitals:   03/23/16 1314  BP: 118/74  Pulse: 67  Temp: 97.8 F (36.6 C)  TempSrc: Oral  Height: 5' (1.524 m)  Weight: 224 lb 6.4 oz (101.787 kg)  SpO2: 98%   Body mass index is 43.83 kg/(m^2).  Physical Exam  Constitutional: She is oriented to person, place, and time. She appears well-developed and well-nourished.    HENT:  Mouth/Throat: Oropharynx is clear and moist. No oropharyngeal exudate.  Eyes: Pupils are equal, round, and reactive to light. No scleral icterus.  Neck: Neck supple. Carotid bruit is not present. No tracheal deviation present. No thyromegaly present.  Cardiovascular: Normal rate, regular rhythm, normal heart sounds and intact distal pulses.  Exam reveals no gallop and no  friction rub.   No murmur heard. No LE edema b/l. no calf TTP.   Pulmonary/Chest: Effort normal and breath sounds normal. No stridor. No respiratory distress. She has no wheezes. She has no rales.  Abdominal: Soft. Bowel sounds are normal. She exhibits no distension and  no mass. There is no hepatomegaly. There is no tenderness. There is no rebound and no guarding.  Musculoskeletal: She exhibits edema and tenderness.  (+) standing flexion on left; short right leg; sacral torsion; left pelvic inflare with SI joint restriction and ASIS TTP; increased lumbar lordosis; paravertebral lumbar muscle hypertrophy with ropy tissue texture changes; left knee swelling anterior and posterior with popliteal fossa TTP; reduced ROM tibia noted; reduced ROM of knee in flexion >extension; neg drawer test; strength intact; +1 pitting LE edema b/l  Lymphadenopathy:    She has no cervical adenopathy.  Neurological: She is alert and oriented to person, place, and time. She has normal reflexes.  Skin: Skin is warm and dry. Rash (eczematous rash on neck with hyperpigmented skin) noted.  Psychiatric: She has a normal mood and affect. Her behavior is normal. Judgment and thought content normal.     Labs reviewed: No visits with results within 3 Month(s) from this visit. Latest known visit with results is:  Admission on 07/31/2015, Discharged on 07/31/2015  Component Date Value Ref Range Status  . WBC 07/31/2015 6.8  4.0 - 10.5 K/uL Final  . RBC 07/31/2015 5.34* 3.87 - 5.11 MIL/uL Final  . Hemoglobin 07/31/2015 10.7* 12.0 - 15.0 g/dL Final  . HCT 07/31/2015 33.9* 36.0 - 46.0 % Final  . MCV 07/31/2015 63.5* 78.0 - 100.0 fL Final  . MCH 07/31/2015 20.0* 26.0 - 34.0 pg Final  . MCHC 07/31/2015 31.6  30.0 - 36.0 g/dL Final  . RDW 07/31/2015 17.4* 11.5 - 15.5 % Final  . Platelets 07/31/2015 351  150 - 400 K/uL Final  . Neutrophils Relative % 07/31/2015 51   Final  . Lymphocytes Relative 07/31/2015 39   Final  .  Monocytes Relative 07/31/2015 8   Final  . Eosinophils Relative 07/31/2015 2   Final  . Basophils Relative 07/31/2015 0   Final  . Neutro Abs 07/31/2015 3.5  1.7 - 7.7 K/uL Final  . Lymphs Abs 07/31/2015 2.7  0.7 - 4.0 K/uL Final  . Monocytes Absolute 07/31/2015 0.5  0.1 - 1.0 K/uL Final  . Eosinophils Absolute 07/31/2015 0.1  0.0 - 0.7 K/uL Final  . Basophils Absolute 07/31/2015 0.0  0.0 - 0.1 K/uL Final  . RBC Morphology 07/31/2015 TARGET CELLS   Final   Comment: POLYCHROMASIA PRESENT ELLIPTOCYTES   . Sodium 07/31/2015 141  135 - 145 mmol/L Final  . Potassium 07/31/2015 3.3* 3.5 - 5.1 mmol/L Final  . Chloride 07/31/2015 106  101 - 111 mmol/L Final  . CO2 07/31/2015 28  22 - 32 mmol/L Final  . Glucose, Bld 07/31/2015 113* 65 - 99 mg/dL Final  . BUN 07/31/2015 17  6 - 20 mg/dL Final  . Creatinine, Ser 07/31/2015 0.88  0.44 - 1.00 mg/dL Final  . Calcium 07/31/2015 9.9  8.9 - 10.3 mg/dL Final  . GFR calc non Af Amer 07/31/2015 >60  >60 mL/min Final  . GFR calc Af Amer 07/31/2015 >60  >60 mL/min Final   Comment: (NOTE) The eGFR has been calculated using the CKD EPI equation. This calculation has not been validated in all clinical situations. eGFR's persistently <60 mL/min signify possible Chronic Kidney Disease.   . Anion gap 07/31/2015 7  5 - 15 Final  . Troponin I 07/31/2015 <0.03  <0.031 ng/mL Final   Comment:        NO INDICATION OF MYOCARDIAL INJURY.   Marland Kitchen D-Dimer, Quant 07/31/2015 <0.27  0.00 - 0.48 ug/mL-FEU Final   Comment:  AT THE INHOUSE ESTABLISHED CUTOFF VALUE OF 0.48 ug/mL FEU, THIS ASSAY HAS BEEN DOCUMENTED IN THE LITERATURE TO HAVE A SENSITIVITY AND NEGATIVE PREDICTIVE VALUE OF AT LEAST 98 TO 99%.  THE TEST RESULT SHOULD BE CORRELATED WITH AN ASSESSMENT OF THE CLINICAL PROBABILITY OF DVT / VTE.     No results found.   Assessment/Plan   ICD-9-CM ICD-10-CM   1. Left knee pain 719.46 M25.562   2. Sciatica of left side 724.3 M54.32   3. Muscle spasm  of left lower extremity 728.85 M62.838   4. Essential hypertension 401.9 I10   5. Asthma, chronic, unspecified asthma severity, uncomplicated 568.61 U83.729   6. Somatic dysfunction of lower extremity 739.6 M99.06   7. Somatic dysfunction of lumbar region 739.3 M99.03   8. Somatic dysfunction of pelvic region 739.5 M99.05   9. Somatic dysfunction of sacral region 739.4 M99.04   10. Morbid obesity due to excess calories ([place]) 278.01 E66.01   11. Contact dermatitis 692.9 L25.9 triamcinolone cream (KENALOG) 0.5 %     PROCEDURE NOTE:  After verbal consent obtained, OMT utilized with improvement in ROM, tissue texture, asymmetry and tenderness. Pt tolerated procedure well.  OMT Treatment 03/23/2016 02/17/2016 12/30/2015 12/02/2015 11/16/2015 10/26/2015 09/16/2015  Head/Face - - - - - - CR;MFR;ST;IND/INR  Head/Face Response - - - - - - I  Neck  - - CS;IND/INR;MFR;ST;DIR CS;IND/INR;MFR;ST;DIR CS;IND/INR;MFR;ST;ME;DIR CS;IND/INR;MFR;ST CS;IND/INR;MFR;ST  Neck Response - - _0   T1-T4  - - - - LAS;MFR;ST;IND/INR - -  T1-T4 Response - - - - I - -  T-5-T9  - - - - LAS;MFR;ST;IND/INR - -  T5-T9 Response - - - - I - -  Ribs  - - - - - - -  Ribs Response - - - - - - -  Lumbar IND/INR;LAS;MFR;ST IND/INR;LAS;MFR;ST IND/INR;LAS;MFR;ST IND/INR;LAS;MFR;ST IND/INR;LAS;MFR;ST IND/INR;LAS;MFR;ST IND/INR;LAS;MFR;ST  Lumbar Response _1  I I  Sacrum IND/INR;MFR;ST IND/INR;MFR;ST IND/INR;MFR;ST IND/INR;MFR;ST IND/INR;MFR;ST IND/INR;MFR;ST IND/INR;MFR;ST  Sacrum Response _2  I I  Pelvis LAS;MFR;ST;IND/INR LAS;MFR;ST;IND/INR LAS;MFR;ST;IND/INR;ME LAS;MFR;ST;IND/INR;ME LAS;MFR;ST;IND/INR LAS;MFR;ST;IND/INR LAS;MFR;ST;IND/INR  Pelvis Response _3  I I  Lower Ext. LAS;MFR;ST;IND/INR;DIR LAS;MFR;ST;IND/INR;DIR LAS;MFR;ST;IND/INR;DIR LAS;MFR;ST;IND/INR;DIR LAS;MFR;ST;IND/INR;DIR LAS;MFR;ST;IND/INR;DIR LAS;MFR;ST;IND/INR;DIR  Lower Ext. Response _4  I I      Push fluids and rest today. No  heavy lifting. May resume nml activity in the AM.  Recommend accupuncture, increase exercise, and weight loss for knee pain  Recommend follow up with Ortho  Continue current medications as ordered  Follow up in 4 weeks for OMM  [facility] S. Perlie Gold  [facility] [address] [place], [place] [address] [phone] Cell (Monday-Friday 8 AM - 5 PM) [phone] After 5 PM and follow prompts

## 2016-03-28 DIAGNOSIS — K219 Gastro-esophageal reflux disease without esophagitis: Secondary | ICD-10-CM | POA: Insufficient documentation

## 2016-03-28 DIAGNOSIS — D649 Anemia, unspecified: Secondary | ICD-10-CM | POA: Insufficient documentation

## 2016-03-28 DIAGNOSIS — D509 Iron deficiency anemia, unspecified: Secondary | ICD-10-CM | POA: Insufficient documentation

## 2016-03-28 DIAGNOSIS — K2270 Barrett's esophagus without dysplasia: Secondary | ICD-10-CM | POA: Insufficient documentation

## 2016-03-28 DIAGNOSIS — Z86010 Personal history of colonic polyps: Secondary | ICD-10-CM | POA: Insufficient documentation

## 2016-04-13 ENCOUNTER — Emergency Department (HOSPITAL_COMMUNITY)
Admission: EM | Admit: 2016-04-13 | Discharge: 2016-04-13 | Disposition: A | Discharge status: Home / Self Care | Payer: BC Managed Care – PPO | Attending: Emergency Medicine | Admitting: Emergency Medicine

## 2016-04-13 ENCOUNTER — Emergency Department (HOSPITAL_COMMUNITY): Payer: BC Managed Care – PPO

## 2016-04-13 ENCOUNTER — Encounter (HOSPITAL_COMMUNITY): Payer: Self-pay | Admitting: *Deleted

## 2016-04-13 DIAGNOSIS — Y999 Unspecified external cause status: Secondary | ICD-10-CM | POA: Diagnosis not present

## 2016-04-13 DIAGNOSIS — Y9389 Activity, other specified: Secondary | ICD-10-CM | POA: Insufficient documentation

## 2016-04-13 DIAGNOSIS — S199XXA Unspecified injury of neck, initial encounter: Secondary | ICD-10-CM | POA: Diagnosis present

## 2016-04-13 DIAGNOSIS — I10 Essential (primary) hypertension: Secondary | ICD-10-CM | POA: Insufficient documentation

## 2016-04-13 DIAGNOSIS — S161XXA Strain of muscle, fascia and tendon at neck level, initial encounter: Secondary | ICD-10-CM

## 2016-04-13 DIAGNOSIS — J45909 Unspecified asthma, uncomplicated: Secondary | ICD-10-CM | POA: Diagnosis not present

## 2016-04-13 DIAGNOSIS — R51 Headache: Secondary | ICD-10-CM | POA: Insufficient documentation

## 2016-04-13 DIAGNOSIS — Z79899 Other long term (current) drug therapy: Secondary | ICD-10-CM | POA: Diagnosis not present

## 2016-04-13 DIAGNOSIS — Y92410 Unspecified street and highway as the place of occurrence of the external cause: Secondary | ICD-10-CM | POA: Diagnosis not present

## 2016-04-13 LAB — CBC WITH DIFFERENTIAL/PLATELET
Basophils Absolute: 0 K/uL (ref 0.0–0.1)
Basophils Relative: 0 %
Eosinophils Absolute: 0.1 K/uL (ref 0.0–0.7)
Eosinophils Relative: 1 %
HCT: 34.4 % — ABNORMAL LOW (ref 36.0–46.0)
Hemoglobin: 10.9 g/dL — ABNORMAL LOW (ref 12.0–15.0)
Lymphocytes Relative: 35 %
Lymphs Abs: 2.1 K/uL (ref 0.7–4.0)
MCH: 20.4 pg — ABNORMAL LOW (ref 26.0–34.0)
MCHC: 31.7 g/dL (ref 30.0–36.0)
MCV: 64.4 fL — ABNORMAL LOW (ref 78.0–100.0)
Monocytes Absolute: 0.3 K/uL (ref 0.1–1.0)
Monocytes Relative: 5 %
Neutro Abs: 3.4 K/uL (ref 1.7–7.7)
Neutrophils Relative %: 59 %
Platelets: 268 K/uL (ref 150–400)
RBC: 5.34 MIL/uL — ABNORMAL HIGH (ref 3.87–5.11)
RDW: 16.9 % — ABNORMAL HIGH (ref 11.5–15.5)
WBC: 5.9 K/uL (ref 4.0–10.5)

## 2016-04-13 LAB — BASIC METABOLIC PANEL WITH GFR
Anion gap: 9 (ref 5–15)
BUN: 15 mg/dL (ref 6–20)
CO2: 26 mmol/L (ref 22–32)
Calcium: 9.6 mg/dL (ref 8.9–10.3)
Chloride: 105 mmol/L (ref 101–111)
Creatinine, Ser: 0.87 mg/dL (ref 0.44–1.00)
GFR calc Af Amer: >60 mL/min
GFR calc non Af Amer: >60 mL/min
Glucose, Bld: 144 mg/dL — ABNORMAL HIGH (ref 65–99)
Potassium: 3.3 mmol/L — ABNORMAL LOW (ref 3.5–5.1)
Sodium: 140 mmol/L (ref 135–145)

## 2016-04-13 LAB — I-STAT TROPONIN, ED: Troponin i, poc: 0 ng/mL (ref 0.00–0.08)

## 2016-04-13 MED ORDER — DEXAMETHASONE SODIUM PHOSPHATE 10 MG/ML IJ SOLN
10.0000 mg | Freq: Once | INTRAMUSCULAR | Status: AC
  Administered 2016-04-13: 10 mg via INTRAVENOUS
  Filled 2016-04-13: qty 1

## 2016-04-13 MED ORDER — CYCLOBENZAPRINE HCL 10 MG PO TABS: 5.0000 mg | ORAL_TABLET | Freq: Two times a day (BID) | ORAL | Status: DC | PRN

## 2016-04-13 MED ORDER — METOCLOPRAMIDE HCL 5 MG/ML IJ SOLN
10.0000 mg | Freq: Once | INTRAMUSCULAR | Status: AC
  Administered 2016-04-13: 10 mg via INTRAVENOUS
  Filled 2016-04-13 (×2): qty 2

## 2016-04-13 MED ORDER — DIPHENHYDRAMINE HCL 50 MG/ML IJ SOLN
12.5000 mg | Freq: Once | INTRAMUSCULAR | Status: AC
  Administered 2016-04-13: 12.5 mg via INTRAVENOUS
  Filled 2016-04-13: qty 1
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
## 2016-04-13 NOTE — ED Notes (Signed)
Patient transported to CT 
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
## 2016-04-13 NOTE — ED Notes (Signed)
Bed: [id number] Expected date:  Expected time:  Means of arrival:  Comments: 38

## 2016-04-20 ENCOUNTER — Ambulatory Visit (INDEPENDENT_AMBULATORY_CARE_PROVIDER_SITE_OTHER): Payer: PRIVATE HEALTH INSURANCE | Admitting: Internal Medicine

## 2016-04-20 ENCOUNTER — Encounter: Payer: Self-pay | Admitting: Internal Medicine

## 2016-04-20 VITALS — BP 122/72 | HR 88 | Temp 98.6°F | Resp 20 | Ht 60.0 in | Wt 220.4 lb

## 2016-04-20 DIAGNOSIS — K5289 Other specified noninfective gastroenteritis and colitis: Secondary | ICD-10-CM

## 2016-04-20 DIAGNOSIS — I10 Essential (primary) hypertension: Secondary | ICD-10-CM

## 2016-04-20 DIAGNOSIS — S134XXA Sprain of ligaments of cervical spine, initial encounter: Secondary | ICD-10-CM | POA: Diagnosis not present

## 2016-04-20 DIAGNOSIS — M542 Cervicalgia: Secondary | ICD-10-CM
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
## 2016-05-30 ENCOUNTER — Other Ambulatory Visit: Payer: Self-pay | Admitting: Internal Medicine

## 2016-06-06 ENCOUNTER — Encounter: Payer: Self-pay | Admitting: Internal Medicine

## 2016-06-06 ENCOUNTER — Ambulatory Visit (INDEPENDENT_AMBULATORY_CARE_PROVIDER_SITE_OTHER): Payer: BC Managed Care – PPO | Admitting: Internal Medicine

## 2016-06-06 VITALS — BP 122/78 | HR 68 | Temp 97.8°F | Ht 60.0 in | Wt 222.2 lb

## 2016-06-06 DIAGNOSIS — M62838 Other muscle spasm: Secondary | ICD-10-CM | POA: Diagnosis not present

## 2016-06-06 DIAGNOSIS — M9903 Segmental and somatic dysfunction of lumbar region: Secondary | ICD-10-CM | POA: Diagnosis not present

## 2016-06-06 DIAGNOSIS — M9905 Segmental and somatic dysfunction of pelvic region: Secondary | ICD-10-CM | POA: Diagnosis not present

## 2016-06-06 DIAGNOSIS — M9904 Segmental and somatic dysfunction of sacral region: Secondary | ICD-10-CM

## 2016-06-06 DIAGNOSIS — M5432 Sciatica, left side: Secondary | ICD-10-CM | POA: Diagnosis not present

## 2016-06-06 DIAGNOSIS — M9906 Segmental and somatic dysfunction of lower extremity: Secondary | ICD-10-CM

## 2016-06-06 DIAGNOSIS — M25562 Pain in left knee: Secondary | ICD-10-CM

## 2016-06-06 NOTE — Patient Instructions (Addendum)
Push fluids and rest today. No heavy lifting. May resume nml activity in the AM.  Continue other medications as ordered  Follow up next available OMT slot
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
## 2016-06-13 ENCOUNTER — Other Ambulatory Visit: Payer: Self-pay | Admitting: Internal Medicine

## 2016-06-20 ENCOUNTER — Other Ambulatory Visit: Payer: Self-pay | Admitting: Internal Medicine

## 2016-06-20 MED ORDER — BUDESONIDE-FORMOTEROL FUMARATE 80-4.5 MCG/ACT IN AERO: INHALATION_SPRAY | RESPIRATORY_TRACT | 6 refills | Status: DC

## 2016-07-04 DIAGNOSIS — Z029 Encounter for administrative examinations, unspecified: Secondary | ICD-10-CM

## 2016-07-18 ENCOUNTER — Encounter: Payer: Self-pay | Admitting: Internal Medicine

## 2016-07-18 ENCOUNTER — Ambulatory Visit (INDEPENDENT_AMBULATORY_CARE_PROVIDER_SITE_OTHER): Payer: BC Managed Care – PPO | Admitting: Internal Medicine

## 2016-07-18 VITALS — BP 110/78 | HR 78 | Temp 97.9°F | Ht 60.0 in | Wt 219.4 lb

## 2016-07-18 DIAGNOSIS — G8929 Other chronic pain: Secondary | ICD-10-CM | POA: Diagnosis not present

## 2016-07-18 DIAGNOSIS — M9905 Segmental and somatic dysfunction of pelvic region: Secondary | ICD-10-CM

## 2016-07-18 DIAGNOSIS — M9904 Segmental and somatic dysfunction of sacral region: Secondary | ICD-10-CM

## 2016-07-18 DIAGNOSIS — M5432 Sciatica, left side: Secondary | ICD-10-CM

## 2016-07-18 DIAGNOSIS — M9903 Segmental and somatic dysfunction of lumbar region: Secondary | ICD-10-CM

## 2016-07-18 DIAGNOSIS — M9906 Segmental and somatic dysfunction of lower extremity: Secondary | ICD-10-CM | POA: Diagnosis not present

## 2016-07-18 DIAGNOSIS — M25562 Pain in left knee: Secondary | ICD-10-CM

## 2016-07-18 DIAGNOSIS — M62838 Other muscle spasm: Secondary | ICD-10-CM

## 2016-07-18 MED ORDER — KETOROLAC TROMETHAMINE 30 MG/ML IM SOLN: 60.0000 mg | Freq: Once | INTRAMUSCULAR | 0 refills | Status: DC

## 2016-07-18 MED ORDER — KETOROLAC TROMETHAMINE 30 MG/ML IJ SOLN
30.0000 mg | Freq: Once | INTRAMUSCULAR | Status: AC
  Administered 2016-07-18: 30 mg via INTRAMUSCULAR

## 2016-07-18 MED ORDER — MELOXICAM 7.5 MG PO TABS: 7.5000 mg | ORAL_TABLET | Freq: Every day | ORAL | 6 refills | Status: DC

## 2016-07-18 MED ORDER — KETOROLAC TROMETHAMINE 60 MG/2ML IM SOLN: 60.0000 mg | Freq: Once | INTRAMUSCULAR | Status: DC

## 2016-07-18 NOTE — Patient Instructions (Signed)
Push fluids and rest today. No heavy lifting. May resume nml activity in the AM.  Continue current medications as ordered  Follow up in 4-6 weeks for OMM
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
## 2016-09-12 ENCOUNTER — Encounter: Payer: Self-pay | Admitting: Internal Medicine

## 2016-09-12 ENCOUNTER — Ambulatory Visit (INDEPENDENT_AMBULATORY_CARE_PROVIDER_SITE_OTHER): Payer: BC Managed Care – PPO | Admitting: Internal Medicine

## 2016-09-12 VITALS — BP 124/76 | HR 72 | Temp 97.9°F | Ht 60.0 in | Wt 217.0 lb

## 2016-09-12 DIAGNOSIS — J45909 Unspecified asthma, uncomplicated: Secondary | ICD-10-CM | POA: Diagnosis not present

## 2016-09-12 DIAGNOSIS — M9903 Segmental and somatic dysfunction of lumbar region: Secondary | ICD-10-CM

## 2016-09-12 DIAGNOSIS — M9905 Segmental and somatic dysfunction of pelvic region: Secondary | ICD-10-CM | POA: Diagnosis not present

## 2016-09-12 DIAGNOSIS — M5432 Sciatica, left side: Secondary | ICD-10-CM

## 2016-09-12 DIAGNOSIS — M9904 Segmental and somatic dysfunction of sacral region: Secondary | ICD-10-CM

## 2016-09-12 DIAGNOSIS — M9901 Segmental and somatic dysfunction of cervical region: Secondary | ICD-10-CM | POA: Diagnosis not present

## 2016-09-12 DIAGNOSIS — M62838 Other muscle spasm: Secondary | ICD-10-CM

## 2016-09-12 DIAGNOSIS — M9906 Segmental and somatic dysfunction of lower extremity: Secondary | ICD-10-CM | POA: Diagnosis not present

## 2016-09-12 DIAGNOSIS — H6692 Otitis media, unspecified, left ear: Secondary | ICD-10-CM

## 2016-09-12 DIAGNOSIS — M67432 Ganglion, left wrist: Secondary | ICD-10-CM

## 2016-09-12 DIAGNOSIS — D229 Melanocytic nevi, unspecified: Secondary | ICD-10-CM

## 2016-09-12 MED ORDER — AMOXICILLIN 500 MG PO CAPS: 500.0000 mg | ORAL_CAPSULE | Freq: Two times a day (BID) | ORAL | 0 refills | Status: DC
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
## 2016-09-12 NOTE — Patient Instructions (Signed)
Push fluids and rest today. No heavy lifting. May resume nml activity in the AM.  Recommend OTC claritin daily for seasonal allergy  Take amoxicillin 2 times daily x 10 days. Take probiotic daily while on antibiotc to keep colon healthy  Follow up in 4-6 weeks for OMM

## 2016-10-10 ENCOUNTER — Encounter: Payer: BC Managed Care – PPO | Admitting: Internal Medicine

## 2016-10-24 ENCOUNTER — Encounter: Payer: Self-pay | Admitting: Internal Medicine

## 2016-10-24 ENCOUNTER — Ambulatory Visit (INDEPENDENT_AMBULATORY_CARE_PROVIDER_SITE_OTHER): Payer: BC Managed Care – PPO | Admitting: Internal Medicine

## 2016-10-24 VITALS — BP 124/78 | HR 75 | Temp 97.9°F | Ht 60.0 in | Wt 222.2 lb

## 2016-10-24 DIAGNOSIS — M9903 Segmental and somatic dysfunction of lumbar region: Secondary | ICD-10-CM | POA: Diagnosis not present

## 2016-10-24 DIAGNOSIS — G8929 Other chronic pain: Secondary | ICD-10-CM

## 2016-10-24 DIAGNOSIS — M9906 Segmental and somatic dysfunction of lower extremity: Secondary | ICD-10-CM

## 2016-10-24 DIAGNOSIS — M25562 Pain in left knee: Secondary | ICD-10-CM | POA: Diagnosis not present

## 2016-10-24 DIAGNOSIS — M5432 Sciatica, left side: Secondary | ICD-10-CM | POA: Diagnosis not present

## 2016-10-24 DIAGNOSIS — M62838 Other muscle spasm: Secondary | ICD-10-CM

## 2016-10-24 DIAGNOSIS — M9905 Segmental and somatic dysfunction of pelvic region: Secondary | ICD-10-CM | POA: Diagnosis not present

## 2016-10-24 DIAGNOSIS — M9904 Segmental and somatic dysfunction of sacral region: Secondary | ICD-10-CM
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
## 2016-10-24 NOTE — Patient Instructions (Signed)
Push fluids and rest today. No heavy lifting. May resume nml activity in the AM.  Continue current medications as ordered  follow up in 4 weeks for OMM or sooner if not feeling better  Recommend follow up with Ortho for left knee pain

## 2016-10-31 ENCOUNTER — Encounter: Payer: BC Managed Care – PPO | Admitting: Internal Medicine

## 2016-11-21 ENCOUNTER — Ambulatory Visit (INDEPENDENT_AMBULATORY_CARE_PROVIDER_SITE_OTHER): Payer: BC Managed Care – PPO | Admitting: Internal Medicine

## 2016-11-21 ENCOUNTER — Encounter: Payer: Self-pay | Admitting: Internal Medicine

## 2016-11-21 VITALS — BP 116/60 | HR 87 | Temp 98.2°F | Ht 60.0 in | Wt 227.0 lb

## 2016-11-21 DIAGNOSIS — M9903 Segmental and somatic dysfunction of lumbar region: Secondary | ICD-10-CM

## 2016-11-21 DIAGNOSIS — M5432 Sciatica, left side: Secondary | ICD-10-CM | POA: Diagnosis not present

## 2016-11-21 DIAGNOSIS — M9905 Segmental and somatic dysfunction of pelvic region: Secondary | ICD-10-CM | POA: Diagnosis not present

## 2016-11-21 DIAGNOSIS — M9901 Segmental and somatic dysfunction of cervical region: Secondary | ICD-10-CM | POA: Diagnosis not present

## 2016-11-21 DIAGNOSIS — M62838 Other muscle spasm: Secondary | ICD-10-CM | POA: Diagnosis not present

## 2016-11-21 DIAGNOSIS — M9904 Segmental and somatic dysfunction of sacral region: Secondary | ICD-10-CM

## 2016-11-21 DIAGNOSIS — M9906 Segmental and somatic dysfunction of lower extremity: Secondary | ICD-10-CM | POA: Diagnosis not present

## 2016-11-21 NOTE — Patient Instructions (Addendum)
Push fluids and rest today. No heavy lifting. May resume nml activity in the AM.  Continue current medications as ordered  Follow up in May 2018 for OMM
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
## 2016-12-19 ENCOUNTER — Ambulatory Visit (INDEPENDENT_AMBULATORY_CARE_PROVIDER_SITE_OTHER): Payer: BC Managed Care – PPO | Admitting: Internal Medicine

## 2016-12-19 ENCOUNTER — Encounter: Payer: Self-pay | Admitting: Internal Medicine

## 2016-12-19 VITALS — BP 100/62 | HR 73 | Temp 98.0°F | Ht 60.0 in | Wt 232.0 lb

## 2016-12-19 DIAGNOSIS — M5431 Sciatica, right side: Secondary | ICD-10-CM | POA: Diagnosis not present

## 2016-12-19 DIAGNOSIS — M9906 Segmental and somatic dysfunction of lower extremity: Secondary | ICD-10-CM | POA: Diagnosis not present

## 2016-12-19 DIAGNOSIS — M62838 Other muscle spasm: Secondary | ICD-10-CM

## 2016-12-19 DIAGNOSIS — M9905 Segmental and somatic dysfunction of pelvic region: Secondary | ICD-10-CM

## 2016-12-19 DIAGNOSIS — M9903 Segmental and somatic dysfunction of lumbar region: Secondary | ICD-10-CM

## 2016-12-19 DIAGNOSIS — M9904 Segmental and somatic dysfunction of sacral region: Secondary | ICD-10-CM

## 2016-12-19 DIAGNOSIS — M25562 Pain in left knee: Secondary | ICD-10-CM | POA: Diagnosis not present

## 2016-12-19 DIAGNOSIS — M5432 Sciatica, left side: Secondary | ICD-10-CM

## 2016-12-19 DIAGNOSIS — G8929 Other chronic pain: Secondary | ICD-10-CM
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
## 2016-12-19 NOTE — Patient Instructions (Signed)
Push fluids and rest today. No heavy lifting. May resume nml activity in the AM.  Continue current medications as ordered  Continue diet and exercise program  Start Belviq daily  Follow up as scheduled in June for OMM

## 2017-03-20 ENCOUNTER — Encounter: Payer: Self-pay | Admitting: Internal Medicine

## 2017-03-20 ENCOUNTER — Ambulatory Visit (INDEPENDENT_AMBULATORY_CARE_PROVIDER_SITE_OTHER): Payer: BC Managed Care – PPO | Admitting: Internal Medicine

## 2017-03-20 VITALS — BP 130/82 | HR 70 | Temp 97.9°F | Ht 60.0 in | Wt 225.6 lb

## 2017-03-20 DIAGNOSIS — M9905 Segmental and somatic dysfunction of pelvic region: Secondary | ICD-10-CM | POA: Diagnosis not present

## 2017-03-20 DIAGNOSIS — M9904 Segmental and somatic dysfunction of sacral region: Secondary | ICD-10-CM | POA: Diagnosis not present

## 2017-03-20 DIAGNOSIS — M9906 Segmental and somatic dysfunction of lower extremity: Secondary | ICD-10-CM | POA: Diagnosis not present

## 2017-03-20 DIAGNOSIS — M9903 Segmental and somatic dysfunction of lumbar region: Secondary | ICD-10-CM

## 2017-03-20 DIAGNOSIS — K633 Ulcer of intestine: Secondary | ICD-10-CM

## 2017-03-20 DIAGNOSIS — M5432 Sciatica, left side: Secondary | ICD-10-CM | POA: Diagnosis not present

## 2017-03-20 DIAGNOSIS — G8929 Other chronic pain: Secondary | ICD-10-CM | POA: Diagnosis not present

## 2017-03-20 DIAGNOSIS — M25562 Pain in left knee: Secondary | ICD-10-CM

## 2017-03-20 DIAGNOSIS — M62838 Other muscle spasm: Secondary | ICD-10-CM | POA: Diagnosis not present
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
## 2017-03-20 NOTE — Patient Instructions (Addendum)
Push fluids and rest today. No heavy lifting. May resume nml activity in the AM.  Continue current medications as ordered  Take nexium (esomeprazole) for acid reflux symptoms  Follow up in 4-6 weeks for OMM

## 2017-03-22 ENCOUNTER — Ambulatory Visit: Payer: Self-pay | Admitting: Internal Medicine

## 2017-04-23 ENCOUNTER — Encounter: Payer: Self-pay | Admitting: Internal Medicine

## 2017-04-23 ENCOUNTER — Ambulatory Visit (INDEPENDENT_AMBULATORY_CARE_PROVIDER_SITE_OTHER): Payer: BC Managed Care – PPO | Admitting: Internal Medicine

## 2017-04-23 VITALS — BP 120/78 | HR 71 | Temp 98.5°F | Ht 60.0 in | Wt 225.2 lb

## 2017-04-23 DIAGNOSIS — M62838 Other muscle spasm: Secondary | ICD-10-CM

## 2017-04-23 DIAGNOSIS — R600 Localized edema: Secondary | ICD-10-CM | POA: Insufficient documentation

## 2017-04-23 DIAGNOSIS — M9905 Segmental and somatic dysfunction of pelvic region: Secondary | ICD-10-CM | POA: Diagnosis not present

## 2017-04-23 DIAGNOSIS — M9904 Segmental and somatic dysfunction of sacral region: Secondary | ICD-10-CM

## 2017-04-23 DIAGNOSIS — M9903 Segmental and somatic dysfunction of lumbar region: Secondary | ICD-10-CM | POA: Diagnosis not present

## 2017-04-23 DIAGNOSIS — R140 Abdominal distension (gaseous): Secondary | ICD-10-CM

## 2017-04-23 DIAGNOSIS — R1013 Epigastric pain: Secondary | ICD-10-CM | POA: Diagnosis not present

## 2017-04-23 DIAGNOSIS — K633 Ulcer of intestine: Secondary | ICD-10-CM

## 2017-04-23 DIAGNOSIS — M9906 Segmental and somatic dysfunction of lower extremity: Secondary | ICD-10-CM

## 2017-04-23 DIAGNOSIS — M5432 Sciatica, left side: Secondary | ICD-10-CM

## 2017-04-23 DIAGNOSIS — E876 Hypokalemia: Secondary | ICD-10-CM

## 2017-04-23 MED ORDER — ESOMEPRAZOLE MAGNESIUM 40 MG PO CPDR
40.0000 mg | DELAYED_RELEASE_CAPSULE | Freq: Two times a day (BID) | ORAL | 0 refills | Status: DC
Start: 2017-04-23 — End: 2017-06-25

## 2017-04-23 NOTE — Patient Instructions (Signed)
Take NEXIUM 2 TIMES DAILY X 2 WEEKS THEN REDUCE TO 1 TIME DAILY  Continue other medications as ordered  Follow up with personal trainer as discussed  Will call with lab results  Push fluids and rest today. No heavy lifting. May resume nml activity in the AM.  Follow up in 4 weeks for OMM, epigastric pain and intestinal ulcer
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
## 2017-04-24 LAB — COMPLETE METABOLIC PANEL WITH GFR
ALBUMIN: 4.1 g/dL (ref 3.6–5.1)
ALK PHOS: 73 U/L (ref 33–130)
ALT: 10 U/L (ref 6–29)
AST: 14 U/L (ref 10–35)
BUN: 14 mg/dL (ref 7–25)
CO2: 26 mmol/L (ref 20–31)
Calcium: 9.9 mg/dL (ref 8.6–10.4)
Chloride: 102 mmol/L (ref 98–110)
Creat: 0.85 mg/dL (ref 0.50–1.05)
GFR, EST AFRICAN AMERICAN: 89 mL/min (ref >=60)
GFR, EST NON AFRICAN AMERICAN: 77 mL/min (ref >=60)
GLUCOSE: 102 mg/dL — AB (ref 65–99)
Potassium: 4 mmol/L (ref 3.5–5.3)
SODIUM: 138 mmol/L (ref 135–146)
Total Bilirubin: 0.2 mg/dL (ref 0.2–1.2)
Total Protein: 7.1 g/dL (ref 6.1–8.1)

## 2017-04-24 LAB — MAGNESIUM: Magnesium: 2.2 mg/dL (ref 1.5–2.5)

## 2017-05-21 ENCOUNTER — Encounter: Payer: Self-pay | Admitting: Internal Medicine

## 2017-05-21 ENCOUNTER — Ambulatory Visit (INDEPENDENT_AMBULATORY_CARE_PROVIDER_SITE_OTHER): Payer: BC Managed Care – PPO | Admitting: Internal Medicine

## 2017-05-21 VITALS — BP 110/66 | HR 76 | Temp 98.4°F | Ht 60.0 in | Wt 228.4 lb

## 2017-05-21 DIAGNOSIS — M9905 Segmental and somatic dysfunction of pelvic region: Secondary | ICD-10-CM | POA: Diagnosis not present

## 2017-05-21 DIAGNOSIS — M9903 Segmental and somatic dysfunction of lumbar region: Secondary | ICD-10-CM

## 2017-05-21 DIAGNOSIS — M62838 Other muscle spasm: Secondary | ICD-10-CM

## 2017-05-21 DIAGNOSIS — G8929 Other chronic pain: Secondary | ICD-10-CM

## 2017-05-21 DIAGNOSIS — M9906 Segmental and somatic dysfunction of lower extremity: Secondary | ICD-10-CM | POA: Diagnosis not present

## 2017-05-21 DIAGNOSIS — R600 Localized edema: Secondary | ICD-10-CM | POA: Diagnosis not present

## 2017-05-21 DIAGNOSIS — M25562 Pain in left knee: Secondary | ICD-10-CM | POA: Diagnosis not present

## 2017-05-21 DIAGNOSIS — M5432 Sciatica, left side: Secondary | ICD-10-CM | POA: Diagnosis not present

## 2017-05-21 DIAGNOSIS — M9904 Segmental and somatic dysfunction of sacral region: Secondary | ICD-10-CM | POA: Diagnosis not present

## 2017-05-21 MED ORDER — KETOROLAC TROMETHAMINE 30 MG/ML IJ SOLN
30.0000 mg | Freq: Once | INTRAMUSCULAR | Status: AC
  Administered 2017-05-21: 30 mg via INTRAMUSCULAR

## 2017-05-21 MED ORDER — TETANUS-DIPHTH-ACELL PERTUSSIS 5-2.5-18.5 LF-MCG/0.5 IM SUSP: 0.5000 mL | Freq: Once | INTRAMUSCULAR | 0 refills | Status: AC

## 2017-05-21 NOTE — Patient Instructions (Signed)
Toradol injection given today  Continue pain medications as ordered  Continue walking exercises as ordered  Push fluids and rest today. No heavy lifting. May resume nml activity in the AM.  Follow up in 2 weeks for left sciatica, OMM
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
## 2017-06-04 ENCOUNTER — Ambulatory Visit (INDEPENDENT_AMBULATORY_CARE_PROVIDER_SITE_OTHER): Payer: BC Managed Care – PPO | Admitting: Internal Medicine

## 2017-06-04 ENCOUNTER — Encounter: Payer: Self-pay | Admitting: Internal Medicine

## 2017-06-04 VITALS — BP 128/76 | HR 71 | Temp 98.0°F | Resp 20 | Ht 60.0 in | Wt 228.4 lb

## 2017-06-04 DIAGNOSIS — R600 Localized edema: Secondary | ICD-10-CM

## 2017-06-04 DIAGNOSIS — M9905 Segmental and somatic dysfunction of pelvic region: Secondary | ICD-10-CM | POA: Diagnosis not present

## 2017-06-04 DIAGNOSIS — M5432 Sciatica, left side: Secondary | ICD-10-CM | POA: Diagnosis not present

## 2017-06-04 DIAGNOSIS — M9904 Segmental and somatic dysfunction of sacral region: Secondary | ICD-10-CM

## 2017-06-04 DIAGNOSIS — M9903 Segmental and somatic dysfunction of lumbar region: Secondary | ICD-10-CM

## 2017-06-04 DIAGNOSIS — M9906 Segmental and somatic dysfunction of lower extremity: Secondary | ICD-10-CM

## 2017-06-04 DIAGNOSIS — M62838 Other muscle spasm: Secondary | ICD-10-CM

## 2017-06-04 NOTE — Patient Instructions (Signed)
Push fluids and rest today. No heavy lifting. May resume nml activity in the AM.  Continue current medications as ordered  Follow up with Ortho as scheduled  Follow up in 3 weeks for OMM.

## 2017-06-04 NOTE — Progress Notes (Signed)
Patient ID: Mackenzie Weber, female   DOB: 12-10-1960, 56 y.o.   MRN: [id number]    Location:  [facility] Place of Service: OFFICE  Chief Complaint  Patient presents with  . Medical Management of Chronic Issues    OMM    HPI:  56 yo female seen today for f/u left sciatica and OMM. She rec'd left knee synvisc injection yesterday by Ortho. She is using a crutch to ambulate. Pain is improved. She has "nagging" pain in left lower back. She spent several hrs in the airport last week while traveling from [place] to [place]. No numbness/tingling in LLE. No loss of bowel/bladder control.   sciatica and LBP -  Back pain worse. (+) left sciatic pain worse. No numbness but has tingling in left hand. No loss of bowel/bladder control. No falls. She takes meloxicam most days daily.  Past Medical History:  Diagnosis Date  . Anemia   . Asthma   . Foot pain, right   . Hypertension   . IBS (irritable bowel syndrome)   . Sciatica of left side   . Seasonal allergies     Past Surgical History:  Procedure Laterality Date  . ABDOMINAL HYSTERECTOMY    . APPENDECTOMY    . BLADDER SUSPENSION  2012   TVT  . BREAST SURGERY  2001   /biopsy-benign  . CHOLECYSTECTOMY    . EXCISIONAL HEMORRHOIDECTOMY    . KNEE SURGERY     right  . Small Bowel Surgery    . TONSILLECTOMY    . TOTAL ABDOMINAL HYSTERECTOMY W/ BILATERAL SALPINGOOPHORECTOMY  1989   LSO-1987; [id number]  . WRIST SURGERY     carpal tunnel repair    Patient Care Team: Gildardo Cranker, MD (Inactive) as PCP - General  Social History   Social History  . Marital status: Married    Spouse name: N/A  . Number of children: N/A  . Years of education: N/A   Occupational History  . retired in 2015 from [employer].    Social History Main Topics  . Smoking status: Never Smoker  . Smokeless tobacco: Never Used  . Alcohol use No  . Drug use: No  . Sexual activity: Yes    Partners: Male    Birth control/ protection: Surgical   Other  Topics Concern  . Not on file   Social History Narrative  . No narrative on file     reports that she has never smoked. She has never used smokeless tobacco. She reports that she does not drink alcohol or use drugs.  Family History  Problem Relation Age of Onset  . Diabetes Father   . Hypertension Father   . Diabetes Mother   . Hypertension Mother   . Asthma Mother   . Diabetes Brother   . Hypertension Sister   . Diabetes Sister   . Cancer Sister 72       breast  . Hypertension Paternal Uncle    Family Status  Relation Status  . Father Deceased at age 50       Heart Attack  . Mother Deceased at age 17       Heart Attack  . Brother Alive  . Sister Alive  . Sister Alive  . Sister Alive  . Sister Alive  . Brother Alive  . Annamarie Major (Not Specified)     Allergies  Allergen Reactions  . Cortisone     Red area around injection site x 40mth  .  Depo-Medrol [Methylprednisolone Acetate] Nausea Only and Other (See Comments)    Dizziness  . Aspirin Anxiety and Other (See Comments)    Rapid Heart beat    Medications: Patient's Medications  New Prescriptions   No medications on file  Previous Medications   BUDESONIDE-FORMOTEROL (SYMBICORT) 80-4.5 MCG/ACT INHALER    Take 2 puffs first thing in am and then another 2 puffs about 12 hours later.   CYCLOBENZAPRINE (FLEXERIL) 10 MG TABLET    Take 0.5-1 tablets (5-10 mg total) by mouth 2 (two) times daily as needed for muscle spasms.   EPINEPHRINE (EPIPEN JR) 0.15 MG/0.3ML INJECTION    Inject 0.15 mg into the muscle daily as needed for anaphylaxis.    ESOMEPRAZOLE (NEXIUM) 40 MG CAPSULE    Take 1 capsule (40 mg total) by mouth 2 (two) times daily before a meal.   ESTRADIOL (ESTRACE) 0.1 MG/GM VAGINAL CREAM    Place 5.32 Applicatorfuls vaginally daily.   IPRATROPIUM (ATROVENT) 0.06 % NASAL SPRAY    Place 2 sprays into both nostrils 4 (four) times daily.   LORCASERIN HCL (BELVIQ) 10 MG TABS    Take 1 tab po BID for weight  management   LOSARTAN-HYDROCHLOROTHIAZIDE (HYZAAR) 100-12.5 MG TABLET    TAKE 1 TABLET BY MOUTH EVERY DAY   MELOXICAM (MOBIC) 7.5 MG TABLET    Take 1 tablet (7.5 mg total) by mouth daily. May take up to 2 tabs po daily prn severe pain   METHOCARBAMOL (ROBAXIN) 750 MG TABLET    Take 750 mg by mouth every 8 (eight) hours as needed for muscle spasms.   MULTIPLE VITAMIN (MULTIVITAMIN) CAPSULE    Take 1 capsule by mouth daily.   PROAIR HFA 108 (90 BASE) MCG/ACT INHALER    Inhale 2 puff into the lungs every 6 hours as needed for wheezing or shortness of breath   TRIAMCINOLONE CREAM (KENALOG) 0.5 %    Apply 1 application topically 2 (two) times daily. May use as needed to rash   TRIAMTERENE-HYDROCHLOROTHIAZIDE (MAXZIDE) 75-50 MG TABLET    TAKE 1 TABLET BY MOUTH EVERY DAY  Modified Medications   No medications on file  Discontinued Medications   No medications on file    Review of Systems  Cardiovascular: Positive for leg swelling.  Musculoskeletal: Positive for arthralgias, back pain and gait problem.  All other systems reviewed and are negative.   Vitals:   06/04/17 1257  BP: 128/76  Pulse: 71  Resp: 20  Temp: 98 F (36.7 C)  Weight: 228 lb 6.4 oz (103.6 kg)  Height: 5' (1.524 m)   Body mass index is 44.61 kg/m.  Physical Exam  Constitutional: She is oriented to person, place, and time. She appears well-developed and well-nourished.    Musculoskeletal: She exhibits edema and tenderness.  Neg standing flexion test; no short leg; left pelvic inflare; left ASIS TTP with SI joint restriction; sacral torsion; paravertebral lumbar and thoracic muscle hypertrophy with ropy tissue texture; left knee swelling with reduced ROM but bo redness or d/c; gait antalgic; trace LE swelling; strength intact  Neurological: She is alert and oriented to person, place, and time.     Labs reviewed: Office Visit on 05/21/2017  Component Date Value Ref Range Status  . HM Colonoscopy 10/15/2012 Patient  Reported  See Report (in chart), Patient Reported Final   [facility], removed polyps, otherwise normal.   . HM Colonoscopy 05/14/2014 Patient Reported  See Report (in chart), Patient Reported Final   Exact Date, patient  called [facility] to confirm.  Office Visit on 04/23/2017  Component Date Value Ref Range Status  . Sodium 04/23/2017 138  135 - 146 mmol/L Final  . Potassium 04/23/2017 4.0  3.5 - 5.3 mmol/L Final  . Chloride 04/23/2017 102  98 - 110 mmol/L Final  . CO2 04/23/2017 26  20 - 31 mmol/L Final  . Glucose, Bld 04/23/2017 102* 65 - 99 mg/dL Final  . BUN 04/23/2017 14  7 - 25 mg/dL Final  . Creat 04/23/2017 0.85  0.50 - 1.05 mg/dL Final   Comment:   For patients > or = 55 years of age: The upper reference limit for Creatinine is approximately 13% higher for people identified as African-American.     . Total Bilirubin 04/23/2017 0.2  0.2 - 1.2 mg/dL Final  . Alkaline Phosphatase 04/23/2017 73  33 - 130 U/L Final  . AST 04/23/2017 14  10 - 35 U/L Final  . ALT 04/23/2017 10  6 - 29 U/L Final  . Total Protein 04/23/2017 7.1  6.1 - 8.1 g/dL Final  . Albumin 04/23/2017 4.1  3.6 - 5.1 g/dL Final  . Calcium 04/23/2017 9.9  8.6 - 10.4 mg/dL Final  . GFR, Est African American 04/23/2017 89  >=60 mL/min Final  . GFR, Est Non African American 04/23/2017 77  >=60 mL/min Final  . Magnesium 04/23/2017 2.2  1.5 - 2.5 mg/dL Final    No results found.   Assessment/Plan   ICD-10-CM   1. Chronic sciatica of left side M54.32   2. Muscle spasm of left lower extremity M62.838   3. Bilateral lower extremity edema R60.0   4. Somatic dysfunction of pelvic region M99.05   5. Somatic dysfunction of lumbar region M99.03   6. Somatic dysfunction of sacral region M99.04   7. Somatic dysfunction of lower extremity M99.06     PROCEDURE NOTE:  After verbal consent obtained, OMT utilized with improvement in ROM, tissue texture, asymmetry and tenderness. Pt tolerated  procedure well.  OMT Treatment 06/04/2017 05/21/2017 04/23/2017 03/20/2017 12/19/2016 11/21/2016 10/24/2016  Head/Face - - - - - - -  Head/Face Response - - - - - - -  Neck  - - - - - CS;IND/INR;MFR;ST;DIR -  Neck Response - - - - - I -  T1-T4  - - - - - - -  T1-T4 Response - - - - - - -  T-5-T9  - - - - - - -  T5-T9 Response - - - - - - -  Ribs  - - - - - - -  Ribs Response - - - - - - -  Lumbar IND/INR;LAS;MFR;ST IND/INR;LAS;MFR;ST IND/INR;LAS;MFR;ST IND/INR;LAS;MFR;ST IND/INR;LAS;MFR;ST IND/INR;LAS;MFR;ST IND/INR;LAS;MFR;ST  Lumbar Response I I I I I I I  Sacrum IND/INR;MFR;ST IND/INR;MFR;ST IND/INR;MFR;ST IND/INR;MFR;ST IND/INR;MFR;ST IND/INR;MFR;ST IND/INR;MFR;ST  Sacrum Response I I I I I I I  Pelvis LAS;MFR;ST;IND/INR;ME LAS;MFR;ST;IND/INR;ME LAS;MFR;ST;IND/INR LAS;MFR;ST;IND/INR LAS;MFR;ST;IND/INR;ME LAS;MFR;ST;IND/INR LAS;MFR;ST;IND/INR  Pelvis Response I I I I I I I  Lower Ext. LAS;MFR;ST;IND/INR;DIR LAS;MFR;ST;IND/INR;DIR LAS;MFR;ST;IND/INR;DIR LAS;MFR;ST;IND/INR;DIR LAS;MFR;ST;IND/INR;DIR LAS;MFR;ST;IND/INR;DIR LAS;MFR;ST;IND/INR;DIR  Lower Ext. Response I I I I I I I   Push fluids and rest today. No heavy lifting. May resume nml activity in the AM.  Continue current medications as ordered  Follow up with Ortho as scheduled  Follow up in 3 weeks for OMM.    Samanvitha Germany S. Perlie Gold  [facility] [address] [place], [place] [address] [phone] Cell (Monday-Friday  8 AM - 5 PM) [phone] After 5 PM and follow prompts

## 2017-06-25 ENCOUNTER — Ambulatory Visit (INDEPENDENT_AMBULATORY_CARE_PROVIDER_SITE_OTHER): Payer: BC Managed Care – PPO | Admitting: Internal Medicine

## 2017-06-25 ENCOUNTER — Encounter: Payer: Self-pay | Admitting: Internal Medicine

## 2017-06-25 VITALS — BP 100/58 | HR 77 | Temp 98.3°F | Ht 60.0 in | Wt 231.0 lb

## 2017-06-25 DIAGNOSIS — R600 Localized edema: Secondary | ICD-10-CM

## 2017-06-25 DIAGNOSIS — J45909 Unspecified asthma, uncomplicated: Secondary | ICD-10-CM

## 2017-06-25 DIAGNOSIS — K633 Ulcer of intestine: Secondary | ICD-10-CM | POA: Diagnosis not present

## 2017-06-25 DIAGNOSIS — K648 Other hemorrhoids: Secondary | ICD-10-CM

## 2017-06-25 DIAGNOSIS — H6692 Otitis media, unspecified, left ear: Secondary | ICD-10-CM

## 2017-06-25 DIAGNOSIS — J0100 Acute maxillary sinusitis, unspecified: Secondary | ICD-10-CM

## 2017-06-25 DIAGNOSIS — R140 Abdominal distension (gaseous): Secondary | ICD-10-CM | POA: Diagnosis not present

## 2017-06-25 MED ORDER — CEFDINIR 300 MG PO CAPS: 300.0000 mg | ORAL_CAPSULE | Freq: Two times a day (BID) | ORAL | 0 refills | Status: DC

## 2017-06-25 MED ORDER — ESOMEPRAZOLE MAGNESIUM 40 MG PO CPDR: 40.0000 mg | DELAYED_RELEASE_CAPSULE | Freq: Two times a day (BID) | ORAL | 6 refills | Status: DC

## 2017-06-25 NOTE — Progress Notes (Signed)
Patient ID: Mackenzie Weber, female   DOB: 03-31-1961, 56 y.o.   MRN: [id number]    Location:  [facility] Place of Service: OFFICE  Chief Complaint  Patient presents with  . OMM    3 week OMM follow-up   . Rectal Problems    Something in rectum, not sure what it is   . Medication Refill    No refills needed     HPI:  56 yo female seen today for chronic left sciatica and back pain. She has increased sinus congestion. She has worsening abdominal bloating with increased abdominal girth x 2-3 weeks. She has seen GI in the past and w/u neg. She would like a second opinion. She feels the sensation even when she drinks water. She takes nexium daily. She is c/a "something in my rectum" x2 mos. She tried hemorrhoid cream without relief. No pain or rectal bleeding. Last flex sigmoidoscopy in 2017. She is followed by GI Dr Dorrene German. She had a capsule endoscopy that revealed intestinal ulcer. She ran out of Nexium 1 week ago. She rec'd her last steroid injection last week into knee  sciatica and LBP -  Back pain worse. (+) left sciatic pain worse. No numbness but has tingling in left hand. No loss of bowel/bladder control. No falls. She takes meloxicam most days daily.   Past Medical History:  Diagnosis Date  . Anemia   . Asthma   . Foot pain, right   . Hypertension   . IBS (irritable bowel syndrome)   . Sciatica of left side   . Seasonal allergies     Past Surgical History:  Procedure Laterality Date  . ABDOMINAL HYSTERECTOMY    . APPENDECTOMY    . BLADDER SUSPENSION  2012   TVT  . BREAST SURGERY  2001   /biopsy-benign  . CHOLECYSTECTOMY    . EXCISIONAL HEMORRHOIDECTOMY    . KNEE SURGERY     right  . Small Bowel Surgery    . TONSILLECTOMY    . TOTAL ABDOMINAL HYSTERECTOMY W/ BILATERAL SALPINGOOPHORECTOMY  1989   LSO-1987; [id number]  . WRIST SURGERY     carpal tunnel repair    Patient Care Team: Gildardo Cranker, MD (Inactive) as PCP - General  Social History   Social History  .  Marital status: Married    Spouse name: N/A  . Number of children: N/A  . Years of education: N/A   Occupational History  . retired in 2015 from [employer].    Social History Main Topics  . Smoking status: Never Smoker  . Smokeless tobacco: Never Used  . Alcohol use No  . Drug use: No  . Sexual activity: Yes    Partners: Male    Birth control/ protection: Surgical   Other Topics Concern  . Not on file   Social History Narrative  . No narrative on file     reports that she has never smoked. She has never used smokeless tobacco. She reports that she does not drink alcohol or use drugs.  Family History  Problem Relation Age of Onset  . Diabetes Father   . Hypertension Father   . Diabetes Mother   . Hypertension Mother   . Asthma Mother   . Diabetes Brother   . Hypertension Sister   . Diabetes Sister   . Cancer Sister 70       breast  . Hypertension Paternal Uncle    Family Status  Relation Status  .  Father Deceased at age 48       Heart Attack  . Mother Deceased at age 67       Heart Attack  . Brother Alive  . Sister Alive  . Sister Alive  . Sister Alive  . Sister Alive  . Brother Alive  . Annamarie Major (Not Specified)     Allergies  Allergen Reactions  . Cortisone     Red area around injection site x 50mth  . Depo-Medrol [Methylprednisolone Acetate] Nausea Only and Other (See Comments)    Dizziness  . Aspirin Anxiety and Other (See Comments)    Rapid Heart beat    Medications: Patient's Medications  New Prescriptions   No medications on file  Previous Medications   BUDESONIDE-FORMOTEROL (SYMBICORT) 80-4.5 MCG/ACT INHALER    Take 2 puffs first thing in am and then another 2 puffs about 12 hours later.   CYCLOBENZAPRINE (FLEXERIL) 10 MG TABLET    Take 0.5-1 tablets (5-10 mg total) by mouth 2 (two) times daily as needed for muscle spasms.   EPINEPHRINE (EPIPEN JR) 0.15 MG/0.3ML INJECTION    Inject 0.15 mg into the muscle daily as needed for  anaphylaxis.    ESOMEPRAZOLE (NEXIUM) 40 MG CAPSULE    Take 1 capsule (40 mg total) by mouth 2 (two) times daily before a meal.   ESTRADIOL (ESTRACE) 0.1 MG/GM VAGINAL CREAM    Place 4.09 Applicatorfuls vaginally daily.   IPRATROPIUM (ATROVENT) 0.06 % NASAL SPRAY    Place 2 sprays into both nostrils 4 (four) times daily.   LORCASERIN HCL (BELVIQ) 10 MG TABS    Take 1 tab po BID for weight management   LOSARTAN-HYDROCHLOROTHIAZIDE (HYZAAR) 100-12.5 MG TABLET    TAKE 1 TABLET BY MOUTH EVERY DAY   MELOXICAM (MOBIC) 7.5 MG TABLET    Take 1 tablet (7.5 mg total) by mouth daily. May take up to 2 tabs po daily prn severe pain   METHOCARBAMOL (ROBAXIN) 750 MG TABLET    Take 750 mg by mouth every 8 (eight) hours as needed for muscle spasms.   MULTIPLE VITAMIN (MULTIVITAMIN) CAPSULE    Take 1 capsule by mouth daily.   PROAIR HFA 108 (90 BASE) MCG/ACT INHALER    Inhale 2 puff into the lungs every 6 hours as needed for wheezing or shortness of breath   TRIAMCINOLONE CREAM (KENALOG) 0.5 %    Apply 1 application topically 2 (two) times daily. May use as needed to rash   TRIAMTERENE-HYDROCHLOROTHIAZIDE (MAXZIDE) 75-50 MG TABLET    TAKE 1 TABLET BY MOUTH EVERY DAY  Modified Medications   No medications on file  Discontinued Medications   No medications on file    Review of Systems  Gastrointestinal: Positive for abdominal distention.       Rectal irritation  All other systems reviewed and are negative.   Vitals:   06/25/17 1304  BP: (!) 100/58  Pulse: 77  Temp: 98.3 F (36.8 C)  TempSrc: Oral  SpO2: 96%  Weight: 231 lb (104.8 kg)  Height: 5' (1.524 m)   Body mass index is 45.11 kg/m.  Physical Exam  Constitutional: She is oriented to person, place, and time. She appears well-developed and well-nourished.  HENT:  Left TM red, bulging but intact; right TM appears nml; left maxillary sinus TTP with boggy tissue texture changes; nares with enlarged grey dry turbinates; oropharynx cobblestoning  and redness but no exudate  Eyes: Pupils are equal, round, and reactive to light. Right eye exhibits no  discharge. Left eye exhibits no discharge. No scleral icterus.  Neck: Neck supple. Carotid bruit is not present. No tracheal deviation present.  Cardiovascular: Normal rate, regular rhythm and intact distal pulses.  Exam reveals no gallop and no friction rub.   Murmur (1/6 SEM) heard. No LE edema b/l. no calf TTP.   Pulmonary/Chest: Effort normal. No stridor. No respiratory distress. She has decreased breath sounds (b/l at base). She has no wheezes. She has no rhonchi. She has no rales.  Abdominal: Soft. Normal appearance and bowel sounds are normal. She exhibits distension. She exhibits no mass. There is no hepatomegaly. There is tenderness. There is no rigidity, no rebound and no guarding. No hernia.  Genitourinary: Rectal exam shows internal hemorrhoid (small, flat, nonthrombosed). Rectal exam shows no external hemorrhoid, no fissure, no mass, no tenderness and anal tone normal.  Musculoskeletal: She exhibits edema and tenderness (L>R ASIS ).  Lymphadenopathy:    She has no cervical adenopathy.  Neurological: She is alert and oriented to person, place, and time. She has normal reflexes.  Skin: Skin is warm and dry. No rash noted.  Psychiatric: She has a normal mood and affect. Her behavior is normal. Judgment and thought content normal.     Labs reviewed: Office Visit on 05/21/2017  Component Date Value Ref Range Status  . HM Colonoscopy 10/15/2012 Patient Reported  See Report (in chart), Patient Reported Final   [facility], removed polyps, otherwise normal.   . HM Colonoscopy 05/14/2014 Patient Reported  See Report (in chart), Patient Reported Final   Exact Date, patient called [facility] to confirm.  Office Visit on 04/23/2017  Component Date Value Ref Range Status  . Sodium 04/23/2017 138  135 - 146 mmol/L Final  . Potassium 04/23/2017 4.0  3.5 - 5.3 mmol/L  Final  . Chloride 04/23/2017 102  98 - 110 mmol/L Final  . CO2 04/23/2017 26  20 - 31 mmol/L Final  . Glucose, Bld 04/23/2017 102* 65 - 99 mg/dL Final  . BUN 04/23/2017 14  7 - 25 mg/dL Final  . Creat 04/23/2017 0.85  0.50 - 1.05 mg/dL Final   Comment:   For patients > or = 55 years of age: The upper reference limit for Creatinine is approximately 13% higher for people identified as African-American.     . Total Bilirubin 04/23/2017 0.2  0.2 - 1.2 mg/dL Final  . Alkaline Phosphatase 04/23/2017 73  33 - 130 U/L Final  . AST 04/23/2017 14  10 - 35 U/L Final  . ALT 04/23/2017 10  6 - 29 U/L Final  . Total Protein 04/23/2017 7.1  6.1 - 8.1 g/dL Final  . Albumin 04/23/2017 4.1  3.6 - 5.1 g/dL Final  . Calcium 04/23/2017 9.9  8.6 - 10.4 mg/dL Final  . GFR, Est African American 04/23/2017 89  >=60 mL/min Final  . GFR, Est Non African American 04/23/2017 77  >=60 mL/min Final  . Magnesium 04/23/2017 2.2  1.5 - 2.5 mg/dL Final    No results found.   Assessment/Plan   ICD-10-CM   1. Acute maxillary sinusitis, recurrence not specified J01.00 cefdinir (OMNICEF) 300 MG capsule  2. Left otitis media, unspecified otitis media type H66.92 cefdinir (OMNICEF) 300 MG capsule  3. Abdominal bloating R14.0 Ambulatory referral to Gastroenterology    esomeprazole (NEXIUM) 40 MG capsule  4. Bilateral lower extremity edema R60.0   5. Morbid obesity due to excess calories (HCC) E66.01   6. Asthma, chronic, unspecified asthma severity,  uncomplicated S50.539   7. Intestinal ulcer K63.3 Ambulatory referral to Gastroenterology    esomeprazole (NEXIUM) 40 MG capsule  8. Internal hemorrhoids without complication J67.3 hydrocortisone-pramoxine (PROCTOFOAM-HC) rectal foam    Refer to GI for 2nd opinion  Continue current medications as ordered  START OMNICEF 300MG  2 TIMES DAILY FOR 10 DAYS  TAKE PROBIOTIC daily while on omnicef  Use hemorrhoid foam as needed for discomfort  Continue current  medications as ordered  Push fluids and rest  Follow up in 3 weeks for OMM  [facility] S. Perlie Gold  [facility] [address] [place], [place] [address] [phone] Cell (Monday-Friday 8 AM - 5 PM) [phone] After 5 PM and follow prompts

## 2017-06-25 NOTE — Patient Instructions (Addendum)
Refer to GI for 2nd opinion  Continue current medications as ordered  START OMNICEF 300MG  2 TIMES DAILY FOR 10 DAYS  TAKE PROBIOTIC daily while on omnicef  Use hemorrhoid foam as needed for discomfort  Continue current medications as ordered  Push fluids and rest  Follow up in 3 weeks for OMM

## 2017-06-28 MED ORDER — HYDROCORTISONE ACE-PRAMOXINE 1-1 % RE FOAM: 1.0000 | Freq: Two times a day (BID) | RECTAL | 1 refills | Status: DC

## 2017-07-23 ENCOUNTER — Encounter: Payer: Self-pay | Admitting: Internal Medicine

## 2017-07-23 ENCOUNTER — Ambulatory Visit (INDEPENDENT_AMBULATORY_CARE_PROVIDER_SITE_OTHER): Payer: BC Managed Care – PPO | Admitting: Internal Medicine

## 2017-07-23 VITALS — BP 128/66 | HR 72 | Resp 12 | Ht 60.0 in | Wt 229.0 lb

## 2017-07-23 DIAGNOSIS — M9904 Segmental and somatic dysfunction of sacral region: Secondary | ICD-10-CM | POA: Diagnosis not present

## 2017-07-23 DIAGNOSIS — M9906 Segmental and somatic dysfunction of lower extremity: Secondary | ICD-10-CM | POA: Diagnosis not present

## 2017-07-23 DIAGNOSIS — M9903 Segmental and somatic dysfunction of lumbar region: Secondary | ICD-10-CM | POA: Diagnosis not present

## 2017-07-23 DIAGNOSIS — R140 Abdominal distension (gaseous): Secondary | ICD-10-CM

## 2017-07-23 DIAGNOSIS — M9905 Segmental and somatic dysfunction of pelvic region: Secondary | ICD-10-CM | POA: Diagnosis not present

## 2017-07-23 DIAGNOSIS — M5432 Sciatica, left side: Secondary | ICD-10-CM

## 2017-07-23 DIAGNOSIS — M9901 Segmental and somatic dysfunction of cervical region: Secondary | ICD-10-CM

## 2017-07-23 NOTE — Patient Instructions (Signed)
Push fluids and rest today. No heavy lifting. May resume nml activity in the AM.  Continue current medications as ordered  Follow up with GI for GERD/abdominal bloating  Follow up with Ortho for left knee pain  Follow up in 4-6 weeks for OMM

## 2017-07-23 NOTE — Progress Notes (Signed)
Patient ID: Mackenzie Weber, female   DOB: 27-Apr-1961, 56 y.o.   MRN: [id number]    Location:  [facility] Place of Service: OFFICE  Chief Complaint  Patient presents with  . Medical Management of Chronic Issues    OMM treatment     HPI:  56 yo female seen today for f/u OMM. She has abdominal discomfort today. She is taking nexium daily. She did not go for 2nd opinion at [facility] due poor phone etiquette experience with scheduler at [facility]. She states she will go back to Dr Dorrene German. She has popping in left neck and feels stiff at times.   sciatica and LBP -  Back pain worse. (+) left sciatic pain worse. No numbness but has tingling in left hand. No loss of bowel/bladder control. No falls. She takes meloxicam most days daily.  Past Medical History:  Diagnosis Date  . Anemia   . Asthma   . Foot pain, right   . Hypertension   . IBS (irritable bowel syndrome)   . Sciatica of left side   . Seasonal allergies     Past Surgical History:  Procedure Laterality Date  . ABDOMINAL HYSTERECTOMY    . APPENDECTOMY    . BLADDER SUSPENSION  2012   TVT  . BREAST SURGERY  2001   /biopsy-benign  . CHOLECYSTECTOMY    . EXCISIONAL HEMORRHOIDECTOMY    . KNEE SURGERY     right  . Small Bowel Surgery    . TONSILLECTOMY    . TOTAL ABDOMINAL HYSTERECTOMY W/ BILATERAL SALPINGOOPHORECTOMY  1989   LSO-1987; [id number]  . WRIST SURGERY     carpal tunnel repair    Patient Care Team: Gildardo Cranker, MD (Inactive) as PCP - General  Social History   Social History  . Marital status: Married    Spouse name: N/A  . Number of children: N/A  . Years of education: N/A   Occupational History  . retired in 2015 from [employer].    Social History Main Topics  . Smoking status: Never Smoker  . Smokeless tobacco: Never Used  . Alcohol use No  . Drug use: No  . Sexual activity: Yes    Partners: Male    Birth control/ protection: Surgical   Other Topics Concern  . Not on file   Social History  Narrative  . No narrative on file     reports that she has never smoked. She has never used smokeless tobacco. She reports that she does not drink alcohol or use drugs.  Family History  Problem Relation Age of Onset  . Diabetes Father   . Hypertension Father   . Diabetes Mother   . Hypertension Mother   . Asthma Mother   . Diabetes Brother   . Hypertension Sister   . Diabetes Sister   . Cancer Sister 62       breast  . Hypertension Paternal Uncle    Family Status  Relation Status  . Father Deceased at age 6       Heart Attack  . Mother Deceased at age 81       Heart Attack  . Brother Alive  . Sister Alive  . Sister Alive  . Sister Alive  . Sister Alive  . Brother Alive  . Annamarie Major (Not Specified)     Allergies  Allergen Reactions  . Cortisone     Red area around injection site x 57mth  . Depo-Medrol [Methylprednisolone Acetate]  Nausea Only and Other (See Comments)    Dizziness  . Aspirin Anxiety and Other (See Comments)    Rapid Heart beat    Medications: Patient's Medications  New Prescriptions   No medications on file  Previous Medications   BUDESONIDE-FORMOTEROL (SYMBICORT) 80-4.5 MCG/ACT INHALER    Take 2 puffs first thing in am and then another 2 puffs about 12 hours later.   CYCLOBENZAPRINE (FLEXERIL) 10 MG TABLET    Take 0.5-1 tablets (5-10 mg total) by mouth 2 (two) times daily as needed for muscle spasms.   EPINEPHRINE (EPIPEN JR) 0.15 MG/0.3ML INJECTION    Inject 0.15 mg into the muscle daily as needed for anaphylaxis.    ESOMEPRAZOLE (NEXIUM) 40 MG CAPSULE    Take 1 capsule (40 mg total) by mouth 2 (two) times daily before a meal.   ESTRADIOL (ESTRACE) 0.1 MG/GM VAGINAL CREAM    Place 7.90 Applicatorfuls vaginally daily.   IPRATROPIUM (ATROVENT) 0.06 % NASAL SPRAY    Place 2 sprays into both nostrils 4 (four) times daily.   LORCASERIN HCL (BELVIQ) 10 MG TABS    Take 1 tab po BID for weight management   LOSARTAN-HYDROCHLOROTHIAZIDE (HYZAAR)  100-12.5 MG TABLET    TAKE 1 TABLET BY MOUTH EVERY DAY   MELOXICAM (MOBIC) 7.5 MG TABLET    Take 1 tablet (7.5 mg total) by mouth daily. May take up to 2 tabs po daily prn severe pain   METHOCARBAMOL (ROBAXIN) 750 MG TABLET    Take 750 mg by mouth every 8 (eight) hours as needed for muscle spasms.   MULTIPLE VITAMIN (MULTIVITAMIN) CAPSULE    Take 1 capsule by mouth daily.   PROAIR HFA 108 (90 BASE) MCG/ACT INHALER    Inhale 2 puff into the lungs every 6 hours as needed for wheezing or shortness of breath   TRIAMCINOLONE CREAM (KENALOG) 0.5 %    Apply 1 application topically 2 (two) times daily. May use as needed to rash   TRIAMTERENE-HYDROCHLOROTHIAZIDE (MAXZIDE) 75-50 MG TABLET    TAKE 1 TABLET BY MOUTH EVERY DAY  Modified Medications   No medications on file  Discontinued Medications   CEFDINIR (OMNICEF) 300 MG CAPSULE    Take 1 capsule (300 mg total) by mouth 2 (two) times daily.   HYDROCORTISONE-PRAMOXINE (PROCTOFOAM-HC) RECTAL FOAM    Place 1 applicator rectally 2 (two) times daily. For hemorrhoids    Review of Systems  Gastrointestinal: Positive for abdominal distention and abdominal pain.  Musculoskeletal: Positive for arthralgias, back pain, gait problem and joint swelling.  All other systems reviewed and are negative.   Vitals:   07/23/17 1309  BP: 128/66  Pulse: 72  Resp: 12  SpO2: 97%  Weight: 229 lb (103.9 kg)  Height: 5' (1.524 m)   Body mass index is 44.72 kg/m.  Physical Exam  Constitutional: She is oriented to person, place, and time. She appears well-developed and well-nourished.    Cardiovascular:  +1 pitting LE edema b/l. No calf TTP  Abdominal: She exhibits distension.  Musculoskeletal: She exhibits edema and tenderness.  Neg standing flexion test; no short leg; +1 pitting LE edema b/l. No calf TTP; sacral torsion; left pelvic inflare with SI joint restriction; paravertebral lumbar, thoracic and cervical muscle hypertrophy with ropy tissue texture changes;  strength intact; reduced cervical ROM  Neurological: She is alert and oriented to person, place, and time.  Skin: Skin is warm and dry. No rash noted.  Psychiatric: She has a normal mood and affect. Her  behavior is normal. Judgment and thought content normal.     Labs reviewed: Office Visit on 05/21/2017  Component Date Value Ref Range Status  . HM Colonoscopy 10/15/2012 Patient Reported  See Report (in chart), Patient Reported Final   [facility], removed polyps, otherwise normal.   . HM Colonoscopy 05/14/2014 Patient Reported  See Report (in chart), Patient Reported Final   Exact Date, patient called [facility] to confirm.  Office Visit on 04/23/2017  Component Date Value Ref Range Status  . Sodium 04/23/2017 138  135 - 146 mmol/L Final  . Potassium 04/23/2017 4.0  3.5 - 5.3 mmol/L Final  . Chloride 04/23/2017 102  98 - 110 mmol/L Final  . CO2 04/23/2017 26  20 - 31 mmol/L Final  . Glucose, Bld 04/23/2017 102* 65 - 99 mg/dL Final  . BUN 04/23/2017 14  7 - 25 mg/dL Final  . Creat 04/23/2017 0.85  0.50 - 1.05 mg/dL Final   Comment:   For patients > or = 55 years of age: The upper reference limit for Creatinine is approximately 13% higher for people identified as African-American.     . Total Bilirubin 04/23/2017 0.2  0.2 - 1.2 mg/dL Final  . Alkaline Phosphatase 04/23/2017 73  33 - 130 U/L Final  . AST 04/23/2017 14  10 - 35 U/L Final  . ALT 04/23/2017 10  6 - 29 U/L Final  . Total Protein 04/23/2017 7.1  6.1 - 8.1 g/dL Final  . Albumin 04/23/2017 4.1  3.6 - 5.1 g/dL Final  . Calcium 04/23/2017 9.9  8.6 - 10.4 mg/dL Final  . GFR, Est African American 04/23/2017 89  >=60 mL/min Final  . GFR, Est Non African American 04/23/2017 77  >=60 mL/min Final  . Magnesium 04/23/2017 2.2  1.5 - 2.5 mg/dL Final    No results found.   Assessment/Plan   ICD-10-CM   1. Chronic sciatica of left side M54.32   2. Abdominal bloating R14.0   3. Somatic dysfunction of  cervical region M99.01   4. Somatic dysfunction of pelvic region M99.05   5. Somatic dysfunction of lumbar region M99.03   6. Somatic dysfunction of sacral region M99.04   7. Somatic dysfunction of lower extremity M99.06    PROCEDURE NOTE:  After verbal consent obtained, OMT utilized with improvement in ROM, tissue texture, asymmetry and tenderness. Pt tolerated procedure well.  OMT Treatment 07/23/2017 06/04/2017 05/21/2017 04/23/2017 03/20/2017 12/19/2016 11/21/2016  Head/Face - - - - - - -  Head/Face Response - - - - - - -  Neck  CS;IND/INR;MFR;ST;DIR - - - - - CS;IND/INR;MFR;ST;DIR  Neck Response I - - - - - I  T1-T4  - - - - - - -  T1-T4 Response - - - - - - -  T-5-T9  - - - - - - -  T5-T9 Response - - - - - - -  Ribs  - - - - - - -  Ribs Response - - - - - - -  Lumbar IND/INR;LAS;MFR;ST IND/INR;LAS;MFR;ST IND/INR;LAS;MFR;ST IND/INR;LAS;MFR;ST IND/INR;LAS;MFR;ST IND/INR;LAS;MFR;ST IND/INR;LAS;MFR;ST  Lumbar Response I I I I I I I  Sacrum IND/INR;MFR;ST IND/INR;MFR;ST IND/INR;MFR;ST IND/INR;MFR;ST IND/INR;MFR;ST IND/INR;MFR;ST IND/INR;MFR;ST  Sacrum Response I I I I I I I  Pelvis LAS;MFR;ST;IND/INR LAS;MFR;ST;IND/INR;ME LAS;MFR;ST;IND/INR;ME LAS;MFR;ST;IND/INR LAS;MFR;ST;IND/INR LAS;MFR;ST;IND/INR;ME LAS;MFR;ST;IND/INR  Pelvis Response I I I I I I I  Lower Ext. LAS;MFR;ST;IND/INR;DIR LAS;MFR;ST;IND/INR;DIR LAS;MFR;ST;IND/INR;DIR LAS;MFR;ST;IND/INR;DIR LAS;MFR;ST;IND/INR;DIR LAS;MFR;ST;IND/INR;DIR LAS;MFR;ST;IND/INR;DIR  Lower Ext. Response I I I I I I I  Push fluids and rest today. No heavy lifting. May resume nml activity in the AM.  Continue current medications as ordered  Follow up with GI for GERD/abdominal bloating  She is considering weight loss sx  Follow up with Ortho for left knee pain  Follow up in 4-6 weeks for OMM  [facility] S. Perlie Gold  [facility] [address] [place], [place] [address] [phone] Cell  (Monday-Friday 8 AM - 5 PM) [phone] After 5 PM and follow prompts

## 2017-08-03 ENCOUNTER — Other Ambulatory Visit: Payer: Self-pay | Admitting: Internal Medicine

## 2017-08-27 ENCOUNTER — Other Ambulatory Visit: Payer: Self-pay | Admitting: Internal Medicine

## 2017-08-28 ENCOUNTER — Encounter: Payer: Self-pay | Admitting: Internal Medicine

## 2017-08-28 ENCOUNTER — Ambulatory Visit (INDEPENDENT_AMBULATORY_CARE_PROVIDER_SITE_OTHER): Payer: BC Managed Care – PPO | Admitting: Internal Medicine

## 2017-08-28 DIAGNOSIS — B370 Candidal stomatitis: Secondary | ICD-10-CM | POA: Diagnosis not present

## 2017-08-28 DIAGNOSIS — J302 Other seasonal allergic rhinitis: Secondary | ICD-10-CM | POA: Diagnosis not present

## 2017-08-28 DIAGNOSIS — M9906 Segmental and somatic dysfunction of lower extremity: Secondary | ICD-10-CM

## 2017-08-28 DIAGNOSIS — M5442 Lumbago with sciatica, left side: Secondary | ICD-10-CM

## 2017-08-28 DIAGNOSIS — M9904 Segmental and somatic dysfunction of sacral region: Secondary | ICD-10-CM | POA: Diagnosis not present

## 2017-08-28 DIAGNOSIS — M9901 Segmental and somatic dysfunction of cervical region: Secondary | ICD-10-CM | POA: Diagnosis not present

## 2017-08-28 DIAGNOSIS — M9905 Segmental and somatic dysfunction of pelvic region: Secondary | ICD-10-CM

## 2017-08-28 DIAGNOSIS — G8929 Other chronic pain: Secondary | ICD-10-CM

## 2017-08-28 DIAGNOSIS — M9903 Segmental and somatic dysfunction of lumbar region: Secondary | ICD-10-CM

## 2017-08-28 DIAGNOSIS — M5432 Sciatica, left side: Secondary | ICD-10-CM | POA: Diagnosis not present

## 2017-08-28 MED ORDER — KETOROLAC TROMETHAMINE 30 MG/ML IJ SOLN: 30.0000 mg | Freq: Once | INTRAMUSCULAR | Status: DC

## 2017-08-28 MED ORDER — FLUCONAZOLE 100 MG PO TABS
100.0000 mg | ORAL_TABLET | Freq: Every day | ORAL | 0 refills | Status: DC
Start: 2017-08-28 — End: 2017-09-24

## 2017-08-28 MED ORDER — KETOROLAC TROMETHAMINE 30 MG/ML IJ SOLN
60.0000 mg | Freq: Once | INTRAMUSCULAR | Status: AC
  Administered 2017-08-28: 60 mg via INTRAVENOUS

## 2017-08-28 NOTE — Progress Notes (Signed)
Patient ID: Mackenzie Weber, female   DOB: 08/01/1961, 56 y.o.   MRN: [id number]    Location:  [facility] Place of Service: OFFICE  Chief Complaint  Patient presents with  . OMM    4-6 week OMM follow-up   . Medication Refill    No refills needed   . Health Maintenance    Pap completed by GYN in Feb 2018- Normal per patient     HPI:  56 yo female seen today for f/u. She had nagging cough that began this AM. She did go for her weekly allergy shot this AM. She was told to take an antihistamine. She has hx asthma.  She saw GI and started digestive enzymes which helps. She was given a list of items to avoid. Next step is BHT testing if sx's persist.  Pain severe in lower back and knees due to climbing ladder and hanging drapes in her living room. No falls. No loss of bowel/bladder control.   Morbid obesity - she looked into bariatric sx program at [facility]. She has diet and exercise on her own without success. She is interested in nutrition and exercise program at [facility] that would hold her accountable. BMI 44.53 today. She has lost 3 lbs since Sept 2018  Past Medical History:  Diagnosis Date  . Anemia   . Asthma   . Foot pain, right   . Hypertension   . IBS (irritable bowel syndrome)   . Sciatica of left side   . Seasonal allergies     Past Surgical History:  Procedure Laterality Date  . ABDOMINAL HYSTERECTOMY    . APPENDECTOMY    . BLADDER SUSPENSION  2012   TVT  . BREAST SURGERY  2001   /biopsy-benign  . CHOLECYSTECTOMY    . EXCISIONAL HEMORRHOIDECTOMY    . KNEE SURGERY     right  . Small Bowel Surgery    . TONSILLECTOMY    . TOTAL ABDOMINAL HYSTERECTOMY W/ BILATERAL SALPINGOOPHORECTOMY  1989   LSO-1987; [id number]  . WRIST SURGERY     carpal tunnel repair    Patient Care Team: Gildardo Cranker, MD (Inactive) as PCP - General  Social History   Socioeconomic History  . Marital status: Married    Spouse name: Not on file  . Number of children: Not on file  . Years of  education: Not on file  . Highest education level: Not on file  Social Needs  . Financial resource strain: Not on file  . Food insecurity - worry: Not on file  . Food insecurity - inability: Not on file  . Transportation needs - medical: Not on file  . Transportation needs - non-medical: Not on file  Occupational History  . Occupation: retired in 2015 from [employer].  Tobacco Use  . Smoking status: Never Smoker  . Smokeless tobacco: Never Used  Substance and Sexual Activity  . Alcohol use: No  . Drug use: No  . Sexual activity: Yes    Partners: Male    Birth control/protection: Surgical  Other Topics Concern  . Not on file  Social History Narrative  . Not on file     reports that  has never smoked. she has never used smokeless tobacco. She reports that she does not drink alcohol or use drugs.  Family History  Problem Relation Age of Onset  . Diabetes Father   . Hypertension Father   . Diabetes Mother   . Hypertension Mother   .  Asthma Mother   . Diabetes Brother   . Hypertension Sister   . Diabetes Sister   . Cancer Sister 23       breast  . Hypertension Paternal Uncle    Family Status  Relation Name Status  . Father Lynann Bologna Deceased at age 37       Heart Attack  . Mother Carlyle Basques Deceased at age 33       Heart Attack  . Brother [employer]  . Sister [employer]  . Sister [employer]  . Sister Graylon Good  . Sister [employer]  . Brother [employer]  . Annamarie Major  (Not Specified)     Allergies  Allergen Reactions  . Cortisone     Red area around injection site x 18mth  . Depo-Medrol [Methylprednisolone Acetate] Nausea Only and Other (See Comments)    Dizziness  . Aspirin Anxiety and Other (See Comments)    Rapid Heart beat    Medications:   Medication List        Accurate as of 08/28/17  1:24 PM. Always use your most recent med list.          cyclobenzaprine 10 MG tablet Commonly known as:  FLEXERIL Take 0.5-1 tablets (5-10 mg  total) by mouth 2 (two) times daily as needed for muscle spasms.   DIGESTIVE ENZYME PO   EPINEPHrine 0.15 MG/0.3ML injection Commonly known as:  EPIPEN JR   esomeprazole 40 MG capsule Commonly known as:  NEXIUM Take 1 capsule (40 mg total) by mouth 2 (two) times daily before a meal.   estradiol 0.1 MG/GM vaginal cream Commonly known as:  ESTRACE Place 2.13 Applicatorfuls vaginally daily.   ipratropium 0.06 % nasal spray Commonly known as:  ATROVENT Place 2 sprays into both nostrils 4 (four) times daily.   Lorcaserin HCl 10 MG Tabs Commonly known as:  BELVIQ Take 1 tab po BID for weight management   losartan-hydrochlorothiazide 100-12.5 MG tablet Commonly known as:  HYZAAR TAKE 1 TABLET BY MOUTH EVERY DAY   meloxicam 7.5 MG tablet Commonly known as:  MOBIC Take 1 tablet (7.5 mg total) by mouth daily. May take up to 2 tabs po daily prn severe pain   methocarbamol 750 MG tablet Commonly known as:  ROBAXIN   multivitamin capsule   PROAIR HFA 108 (90 Base) MCG/ACT inhaler Generic drug:  albuterol   SYMBICORT 80-4.5 MCG/ACT inhaler Generic drug:  budesonide-formoterol INHALE 2 PUFFS FIRST THING IN THE MORNING AND THEN INHALE ANOTHER 2 PUFFS ABOUT 12 HOURS LATER   triamcinolone cream 0.5 % Commonly known as:  KENALOG Apply 1 application topically 2 (two) times daily. May use as needed to rash   triamterene-hydrochlorothiazide 75-50 MG tablet Commonly known as:  MAXZIDE TAKE 1 TABLET BY MOUTH EVERY DAY       Review of Systems  Respiratory: Positive for cough.   Musculoskeletal: Positive for arthralgias, back pain, gait problem and joint swelling.  All other systems reviewed and are negative.   Vitals:   08/28/17 1314  BP: 128/78  Pulse: 66  Temp: 98 F (36.7 C)  TempSrc: Oral  SpO2: 97%  Weight: 228 lb (103.4 kg)  Height: 5' (1.524 m)   Body mass index is 44.53 kg/m.  Physical Exam  Constitutional: She is oriented to person, place, and time. She  appears well-developed and well-nourished.    Looks well in NAD, no conversational dyspnea  HENT:  Right buccal white plaque; no tongue involvement  Pulmonary/Chest: Effort  normal. No respiratory distress.  Reduced BS b/l at base but no w/r/r  Musculoskeletal: She exhibits edema and tenderness.  (+) left standing flexion test; no short leg; right medial TTP superior to medial malleolus; L>R SI joint restriction; left pelvic in flare; sacral torsion; L>R ASIS TTP; paravertebral lumbar, thoracic, cervical muscle hypertrophy with ropy tissue texture changes; strength intact; increased lumbar lordosis; trace LE swelling  Neurological: She is alert and oriented to person, place, and time.  Skin: Skin is warm and dry. No rash noted.  Psychiatric: She has a normal mood and affect. Her behavior is normal. Judgment and thought content normal.     Labs reviewed: No visits with results within 3 Month(s) from this visit.  Latest known visit with results is:  Office Visit on 05/21/2017  Component Date Value Ref Range Status  . HM Colonoscopy 10/15/2012 Patient Reported  See Report (in chart), Patient Reported Final   [facility], removed polyps, otherwise normal.   . HM Colonoscopy 05/14/2014 Patient Reported  See Report (in chart), Patient Reported Final   Exact Date, patient called [facility] to confirm.    No results found.   Assessment/Plan   ICD-10-CM   1. Morbid obesity due to excess calories (HCC) E66.01   2. Chronic sciatica of left side M54.32 ketorolac (TORADOL) 30 MG/ML injection 60 mg    DISCONTINUED: ketorolac (TORADOL) 30 MG/ML injection 30 mg  3. Chronic bilateral low back pain with left-sided sciatica M54.42 ketorolac (TORADOL) 30 MG/ML injection 60 mg   G89.29 DISCONTINUED: ketorolac (TORADOL) 30 MG/ML injection 30 mg  4. Seasonal allergic rhinitis, unspecified trigger J30.2   5. Oral thrush B37.0 fluconazole (DIFLUCAN) 100 MG tablet  6. Somatic  dysfunction of pelvic region M99.05   7. Somatic dysfunction of lumbar region M99.03   8. Somatic dysfunction of lower extremity M99.06   9. Somatic dysfunction of sacral region M99.04   10. Somatic dysfunction of cervical region M99.01    PROCEDURE NOTE:  After verbal consent obtained, OMT utilized with improvement in ROM, tissue texture, asymmetry and tenderness. Pt tolerated procedure well. OMT Treatment 08/28/2017 07/23/2017 06/04/2017 05/21/2017 04/23/2017 03/20/2017 12/19/2016  Head/Face - - - - - - -  Head/Face Response - - - - - - -  Neck  IND/INR;MFR;ST;DIR CS;IND/INR;MFR;ST;DIR - - - - -  Neck Response I I - - - - -  T1-T4  - - - - - - -  T1-T4 Response - - - - - - -  T-5-T9  - - - - - - -  T5-T9 Response - - - - - - -  Ribs  - - - - - - -  Ribs Response - - - - - - -  Lumbar IND/INR;LAS;MFR;ST IND/INR;LAS;MFR;ST IND/INR;LAS;MFR;ST IND/INR;LAS;MFR;ST IND/INR;LAS;MFR;ST IND/INR;LAS;MFR;ST IND/INR;LAS;MFR;ST  Lumbar Response I I I I I I I  Sacrum IND/INR;MFR;ST IND/INR;MFR;ST IND/INR;MFR;ST IND/INR;MFR;ST IND/INR;MFR;ST IND/INR;MFR;ST IND/INR;MFR;ST  Sacrum Response I I I I I I I  Pelvis LAS;MFR;ST;IND/INR LAS;MFR;ST;IND/INR LAS;MFR;ST;IND/INR;ME LAS;MFR;ST;IND/INR;ME LAS;MFR;ST;IND/INR LAS;MFR;ST;IND/INR LAS;MFR;ST;IND/INR;ME  Pelvis Response I I I I I I I  Lower Ext. LAS;MFR;ST;IND/INR;DIR LAS;MFR;ST;IND/INR;DIR LAS;MFR;ST;IND/INR;DIR LAS;MFR;ST;IND/INR;DIR LAS;MFR;ST;IND/INR;DIR LAS;MFR;ST;IND/INR;DIR LAS;MFR;ST;IND/INR;DIR  Lower Ext. Response I I I I I I I   Push fluids and rest today. No heavy lifting. May resume nml activity in the AM.  TORADOL 60MG  injection given today for pain - AVOID NSAIDS for at least 6 hrs  Samples of XYZAL given today - take 1 tab daily for seasonal allergy  START DIFLUCAN 100MG  TAKE  2 TABS ON DAY 1 THEN 1 TAB DAILY X 4  Continue other medications as ordered  Follow with [facility] for weight loss management program  Follow up in 4 weeks for  OMM, chronic pain    Mackenzie Weber  [facility] and [facility] [address] [place], [place] [address] [phone] Cell (Monday-Friday 8 AM - 5 PM) [phone] After 5 PM and follow prompts

## 2017-08-28 NOTE — Patient Instructions (Addendum)
Push fluids and rest today. No heavy lifting. May resume nml activity in the AM.  TORADOL 60MG  injection given today for pain - AVOID NSAIDS for at least 6 hrs  Samples of XYZAL given today - take 1 tab daily for seasonal allergy  START DIFLUCAN 100MG  TAKE 2 TABS ON DAY 1 THEN 1 TAB DAILY X 4  Continue other medications as ordered  Follow with [facility] for weight loss management program  Follow up in 4 weeks for OMM, chronic pain

## 2017-09-24 ENCOUNTER — Ambulatory Visit (INDEPENDENT_AMBULATORY_CARE_PROVIDER_SITE_OTHER): Payer: BC Managed Care – PPO | Admitting: Internal Medicine

## 2017-09-24 ENCOUNTER — Encounter: Payer: Self-pay | Admitting: Internal Medicine

## 2017-09-24 VITALS — BP 130/78 | HR 74 | Temp 97.8°F | Ht 60.0 in | Wt 231.0 lb

## 2017-09-24 DIAGNOSIS — M9906 Segmental and somatic dysfunction of lower extremity: Secondary | ICD-10-CM | POA: Diagnosis not present

## 2017-09-24 DIAGNOSIS — M9903 Segmental and somatic dysfunction of lumbar region: Secondary | ICD-10-CM

## 2017-09-24 DIAGNOSIS — S93401A Sprain of unspecified ligament of right ankle, initial encounter: Secondary | ICD-10-CM | POA: Diagnosis not present

## 2017-09-24 DIAGNOSIS — M5442 Lumbago with sciatica, left side: Secondary | ICD-10-CM | POA: Diagnosis not present

## 2017-09-24 DIAGNOSIS — G8929 Other chronic pain: Secondary | ICD-10-CM

## 2017-09-24 DIAGNOSIS — R140 Abdominal distension (gaseous): Secondary | ICD-10-CM | POA: Diagnosis not present

## 2017-09-24 DIAGNOSIS — K633 Ulcer of intestine: Secondary | ICD-10-CM

## 2017-09-24 DIAGNOSIS — M9904 Segmental and somatic dysfunction of sacral region: Secondary | ICD-10-CM | POA: Diagnosis not present

## 2017-09-24 DIAGNOSIS — M9905 Segmental and somatic dysfunction of pelvic region: Secondary | ICD-10-CM | POA: Diagnosis not present

## 2017-09-24 DIAGNOSIS — M5432 Sciatica, left side: Secondary | ICD-10-CM

## 2017-09-24 MED ORDER — ESOMEPRAZOLE MAGNESIUM 40 MG PO CPDR: 40.0000 mg | DELAYED_RELEASE_CAPSULE | Freq: Two times a day (BID) | ORAL | 4 refills | Status: DC

## 2017-09-24 MED ORDER — KETOROLAC TROMETHAMINE 30 MG/ML IJ SOLN
30.0000 mg | Freq: Once | INTRAMUSCULAR | Status: AC
  Administered 2017-09-24: 30 mg via INTRAVENOUS

## 2017-09-24 NOTE — Progress Notes (Signed)
Patient ID: Mackenzie Weber, female   DOB: [birth year], 56 y.o.   MRN: [id number]   Location:  [facility] OFFICE  Provider: DR Arletha Grippe  Code Status: FULL CODE Goals of Care:  Advanced Directives 06/04/2017  Does Patient Have a Medical Advance Directive? No  Would patient like information on creating a medical advance directive? No - Patient declined     Chief Complaint  Patient presents with  . OMM    4 week OMM treatment, FYI right sprain ankle- seen specalist   . Medication Refill    Refill Nexium     HPI: Patient is a 56 y.o. female seen today for chronic sciatica.  Her right ankle began to hurt worse after her last ov and she ended up going to Ortho. She was dx with severe right sprain and is now in a boot. She is getting PT and has had TENs unit applied as well. No falls. No loss of bowel/bladder control. No numbness or tingling. Pain in lower back worse since she is wearing a boot on right foot/leg.   Morbid obesity - she looked into bariatric sx program at [facility]. She has diet and exercise on her own without success. She is interested in nutrition and exercise program at [facility] that would hold her accountable. BMI 45.11 today. She has gained 3 lbs but is wearing right foot boot  Hx intestinal ulcer - stable on nexium  Past Medical History:  Diagnosis Date  . Anemia   . Asthma   . Foot pain, right   . Hypertension   . IBS (irritable bowel syndrome)   . Sciatica of left side   . Seasonal allergies     Past Surgical History:  Procedure Laterality Date  . ABDOMINAL HYSTERECTOMY    . APPENDECTOMY    . BLADDER SUSPENSION  2012   TVT  . BREAST SURGERY  2001   /biopsy-benign  . CHOLECYSTECTOMY    . EXCISIONAL HEMORRHOIDECTOMY    . KNEE SURGERY     right  . Small Bowel Surgery    . TONSILLECTOMY    . TOTAL ABDOMINAL HYSTERECTOMY W/ BILATERAL SALPINGOOPHORECTOMY  1989   LSO-1987; [id number]  . WRIST SURGERY     carpal tunnel repair     reports that  has never  smoked. she has never used smokeless tobacco. She reports that she does not drink alcohol or use drugs. Social History   Socioeconomic History  . Marital status: Married    Spouse name: Not on file  . Number of children: Not on file  . Years of education: Not on file  . Highest education level: Not on file  Social Needs  . Financial resource strain: Not on file  . Food insecurity - worry: Not on file  . Food insecurity - inability: Not on file  . Transportation needs - medical: Not on file  . Transportation needs - non-medical: Not on file  Occupational History  . Occupation: retired in 2015 from [employer].  Tobacco Use  . Smoking status: Never Smoker  . Smokeless tobacco: Never Used  Substance and Sexual Activity  . Alcohol use: No  . Drug use: No  . Sexual activity: Yes    Partners: Male    Birth control/protection: Surgical  Other Topics Concern  . Not on file  Social History Narrative  . Not on file    Family History  Problem Relation Age of Onset  . Diabetes Father   .  Hypertension Father   . Diabetes Mother   . Hypertension Mother   . Asthma Mother   . Diabetes Brother   . Hypertension Sister   . Diabetes Sister   . Cancer Sister 51       breast  . Hypertension Paternal Uncle     Allergies  Allergen Reactions  . Cortisone     Red area around injection site x 57mth  . Depo-Medrol [Methylprednisolone Acetate] Nausea Only and Other (See Comments)    Dizziness  . Aspirin Anxiety and Other (See Comments)    Rapid Heart beat    Outpatient Encounter Medications as of 09/24/2017  Medication Sig  . cyclobenzaprine (FLEXERIL) 10 MG tablet Take 0.5-1 tablets (5-10 mg total) by mouth 2 (two) times daily as needed for muscle spasms.  . Digestive Enzymes (DIGESTIVE ENZYME PO) Take 2 (two) times daily by mouth.  . EPINEPHrine (EPIPEN JR) 0.15 MG/0.3ML injection Inject 0.15 mg into the muscle daily as needed for anaphylaxis.   Marland Kitchen esomeprazole (NEXIUM) 40 MG  capsule Take 1 capsule (40 mg total) by mouth 2 (two) times daily before a meal.  . estradiol (ESTRACE) 0.1 MG/GM vaginal cream Place 3.87 Applicatorfuls vaginally daily.  Marland Kitchen ipratropium (ATROVENT) 0.06 % nasal spray Place 2 sprays into both nostrils 4 (four) times daily.  . Lorcaserin HCl (BELVIQ) 10 MG TABS Take 1 tab po BID for weight management  . losartan-hydrochlorothiazide (HYZAAR) 100-12.5 MG tablet TAKE 1 TABLET BY MOUTH EVERY DAY  . meloxicam (MOBIC) 7.5 MG tablet Take 1 tablet (7.5 mg total) by mouth daily. May take up to 2 tabs po daily prn severe pain  . methocarbamol (ROBAXIN) 750 MG tablet Take 750 mg by mouth every 8 (eight) hours as needed for muscle spasms.  . Multiple Vitamin (MULTIVITAMIN) capsule Take 1 capsule by mouth daily.  Marland Kitchen PROAIR HFA 108 (90 Base) MCG/ACT inhaler Inhale 2 puff into the lungs every 6 hours as needed for wheezing or shortness of breath  . SYMBICORT 80-4.5 MCG/ACT inhaler INHALE 2 PUFFS FIRST THING IN THE MORNING AND THEN INHALE ANOTHER 2 PUFFS ABOUT 12 HOURS LATER  . triamcinolone cream (KENALOG) 0.5 % Apply 1 application topically 2 (two) times daily. May use as needed to rash  . triamterene-hydrochlorothiazide (MAXZIDE) 75-50 MG tablet TAKE 1 TABLET BY MOUTH EVERY DAY  . [DISCONTINUED] fluconazole (DIFLUCAN) 100 MG tablet Take 1 tablet (100 mg total) daily by mouth.   No facility-administered encounter medications on file as of 09/24/2017.     Review of Systems:  Review of Systems  Constitutional: Positive for activity change.  Musculoskeletal: Positive for arthralgias and gait problem. Negative for neck pain.  All other systems reviewed and are negative.   Health Maintenance  Topic Date Due  . INFLUENZA VACCINE  10/15/2017 (Originally 05/15/2017)  . TETANUS/TDAP  11/15/2017 (Originally 02/18/1981)  . Hepatitis C Screening  10/15/2018 (Originally 1960-12-31)  . HIV Screening  10/15/2018 (Originally 02/18/1977)  . MAMMOGRAM  11/15/2018  . PAP SMEAR   11/16/2019  . COLONOSCOPY  07/29/2024    Physical Exam: Vitals:   09/24/17 1302  BP: 130/78  Pulse: 74  Temp: 97.8 F (36.6 C)  TempSrc: Oral  SpO2: 94%  Weight: 231 lb (104.8 kg)  Height: 5' (1.524 m)   Body mass index is 45.11 kg/m. Physical Exam  Constitutional: She is oriented to person, place, and time. She appears well-developed and well-nourished.    (+) left standing flexion test; L>R SI joint restriction; left pelvic  inflare; left short leg; sacral torsion; increased lumbar lordosis; strength intact; trace R>LLE edema; no calf TTP; right anterior/posterior to medial malleolus TTP with min swelling; transverse arch TTP with reduced dorsiflexion; no hypertrophy of plantar fascia.  Abdominal: Soft. Bowel sounds are normal. She exhibits distension. She exhibits no mass. There is no tenderness. There is no rebound and no guarding.  Musculoskeletal: She exhibits edema and tenderness.  Neurological: She is alert and oriented to person, place, and time.  Skin: Skin is warm and dry. No rash noted.  Psychiatric: She has a normal mood and affect. Her behavior is normal. Judgment and thought content normal.    Labs reviewed: Basic Metabolic Panel: Recent Labs    04/23/17 1441  NA 138  K 4.0  CL 102  CO2 26  GLUCOSE 102*  BUN 14  CREATININE 0.85  CALCIUM 9.9  MG 2.2   Liver Function Tests: Recent Labs    04/23/17 1441  AST 14  ALT 10  ALKPHOS 73  BILITOT 0.2  PROT 7.1  ALBUMIN 4.1   No results for input(s): LIPASE, AMYLASE in the last 8760 hours. No results for input(s): AMMONIA in the last 8760 hours. CBC: No results for input(s): WBC, NEUTROABS, HGB, HCT, MCV, PLT in the last 8760 hours. Lipid Panel: No results for input(s): CHOL, HDL, LDLCALC, TRIG, CHOLHDL, LDLDIRECT in the last 8760 hours. No results found for: HGBA1C  Procedures since last visit: No results found.  Assessment/Plan   ICD-10-CM   1. Sprain of right ankle, unspecified ligament,  initial encounter S93.401A ketorolac (TORADOL) 30 MG/ML injection 30 mg  2. Intestinal ulcer K63.3 esomeprazole (NEXIUM) 40 MG capsule  3. Abdominal bloating R14.0 esomeprazole (NEXIUM) 40 MG capsule  4. Morbid obesity due to excess calories (HCC) E66.01   5. Chronic sciatica of left side M54.32   6. Chronic bilateral low back pain with left-sided sciatica M54.42    G89.29   7. Somatic dysfunction of lower extremity M99.06   8. Somatic dysfunction of pelvic region M99.05   9. Somatic dysfunction of lumbar region M99.03   10. Somatic dysfunction of sacral region M99.04    PROCEDURE NOTE:  After verbal consent obtained, OMT utilized with improvement in ROM, tissue texture, asymmetry and tenderness. Pt tolerated procedure well.  OMT Treatment 09/24/2017 08/28/2017 07/23/2017 06/04/2017 05/21/2017 04/23/2017 03/20/2017  Head/Face - - - - - - -  Head/Face Response - - - - - - -  Neck  - IND/INR;MFR;ST;DIR CS;IND/INR;MFR;ST;DIR - - - -  Neck Response - I I - - - -  T1-T4  - - - - - - -  T1-T4 Response - - - - - - -  T-5-T9  - - - - - - -  T5-T9 Response - - - - - - -  Ribs  - - - - - - -  Ribs Response - - - - - - -  Lumbar LAS;MFR;ST;IND/INR;DIR IND/INR;LAS;MFR;ST IND/INR;LAS;MFR;ST IND/INR;LAS;MFR;ST IND/INR;LAS;MFR;ST IND/INR;LAS;MFR;ST IND/INR;LAS;MFR;ST  Lumbar Response I I I I I I I  Sacrum MFR;ST;IND/INR IND/INR;MFR;ST IND/INR;MFR;ST IND/INR;MFR;ST IND/INR;MFR;ST IND/INR;MFR;ST IND/INR;MFR;ST  Sacrum Response I I I I I I I  Pelvis LAS;MFR;ST;IND/INR;DIR LAS;MFR;ST;IND/INR LAS;MFR;ST;IND/INR LAS;MFR;ST;IND/INR;ME LAS;MFR;ST;IND/INR;ME LAS;MFR;ST;IND/INR LAS;MFR;ST;IND/INR  Pelvis Response I I I I I I I  Lower Ext. LAS;MFR;ST;IND/INR;DIR LAS;MFR;ST;IND/INR;DIR LAS;MFR;ST;IND/INR;DIR LAS;MFR;ST;IND/INR;DIR LAS;MFR;ST;IND/INR;DIR LAS;MFR;ST;IND/INR;DIR LAS;MFR;ST;IND/INR;DIR  Lower Ext. Response I I I I I I I   Push fluids and rest today. No heavy lifting. May resume nml activity in the  AM.  toradol 30mg  injection  given today  Continue current medications as ordered  Wear right foot boot as directed  Continue PT as ordered  Follow up with Ortho as scheduled  NO HEELS UNTIL CLEARED BY PT AND ORTHO  Follow up in 4 weeks for OMM, left sciatica, right ankle sprain  Roxi Hlavaty S. Perlie Gold  [facility] [address] [place], [place] [address] [phone] Cell (Monday-Friday 8 AM - 5 PM) [phone] After 5 PM and follow prompts

## 2017-09-24 NOTE — Patient Instructions (Signed)
Push fluids and rest today. No heavy lifting. May resume nml activity in the AM.  toradol 30mg  injection given today  Continue current medications as ordered  Wear right foot boot as directed  Continue PT as ordered  Follow up with Ortho as scheduled  NO HEELS UNTIL CLEARED BY PT AND ORTHO  Follow up in 4 weeks for OMM, left sciatica, right ankle sprain

## 2017-09-27 ENCOUNTER — Encounter: Payer: Self-pay | Admitting: Internal Medicine

## 2017-10-22 ENCOUNTER — Encounter: Payer: Self-pay | Admitting: Internal Medicine

## 2017-10-22 ENCOUNTER — Ambulatory Visit (INDEPENDENT_AMBULATORY_CARE_PROVIDER_SITE_OTHER): Payer: BC Managed Care – PPO | Admitting: Internal Medicine

## 2017-10-22 VITALS — BP 128/70 | HR 87 | Temp 98.6°F | Ht 60.0 in | Wt 228.0 lb

## 2017-10-22 DIAGNOSIS — M9907 Segmental and somatic dysfunction of upper extremity: Secondary | ICD-10-CM

## 2017-10-22 DIAGNOSIS — M5432 Sciatica, left side: Secondary | ICD-10-CM | POA: Diagnosis not present

## 2017-10-22 DIAGNOSIS — S93401D Sprain of unspecified ligament of right ankle, subsequent encounter: Secondary | ICD-10-CM

## 2017-10-22 DIAGNOSIS — M9903 Segmental and somatic dysfunction of lumbar region: Secondary | ICD-10-CM

## 2017-10-22 DIAGNOSIS — M9905 Segmental and somatic dysfunction of pelvic region: Secondary | ICD-10-CM

## 2017-10-22 DIAGNOSIS — M9904 Segmental and somatic dysfunction of sacral region: Secondary | ICD-10-CM | POA: Diagnosis not present

## 2017-10-22 DIAGNOSIS — M9906 Segmental and somatic dysfunction of lower extremity: Secondary | ICD-10-CM | POA: Diagnosis not present

## 2017-10-22 DIAGNOSIS — G8929 Other chronic pain: Secondary | ICD-10-CM

## 2017-10-22 DIAGNOSIS — J302 Other seasonal allergic rhinitis: Secondary | ICD-10-CM | POA: Diagnosis not present

## 2017-10-22 DIAGNOSIS — M5442 Lumbago with sciatica, left side: Secondary | ICD-10-CM | POA: Diagnosis not present

## 2017-10-22 NOTE — Progress Notes (Signed)
Patient ID: Mackenzie Weber, female   DOB: 08/13/1961, 57 y.o.   MRN: [id number]    Location:  [facility] OFFICE  Provider: DR Arletha Grippe  Code Status: FULL CODE Goals of Care:  Advanced Directives 06/04/2017  Does Patient Have a Medical Advance Directive? No  Would patient like information on creating a medical advance directive? No - Patient declined     Chief Complaint  Patient presents with  . OMM    Osteo Med Treatment   . Medication Refill    No refills needed     HPI: Patient is a 57 y.o. female seen today for chronic sciatica. Right ankle sprain is much better but still occasionally sore. She no longer needs to wear boot. She has about 2 PT appts left. No falls. No loss of bowel/bladder control. No numbness or tingling. Pain in lower back is "not too bad". She had a charley horse in left calf yesterday. She went to water aerobics and it felt better.   Morbid obesity - she looked into bariatric sx program at [facility]. She has diet and exercise on her own without success. She is interested in nutrition and exercise program at [facility] that would hold her accountable. BMI 44.53 today. She LOST 3 lbs since last month   Past Medical History:  Diagnosis Date  . Anemia   . Asthma   . Foot pain, right   . Hypertension   . IBS (irritable bowel syndrome)   . Sciatica of left side   . Seasonal allergies     Past Surgical History:  Procedure Laterality Date  . ABDOMINAL HYSTERECTOMY    . APPENDECTOMY    . BLADDER SUSPENSION  2012   TVT  . BREAST SURGERY  2001   /biopsy-benign  . CHOLECYSTECTOMY    . EXCISIONAL HEMORRHOIDECTOMY    . KNEE SURGERY     right  . Small Bowel Surgery    . TONSILLECTOMY    . TOTAL ABDOMINAL HYSTERECTOMY W/ BILATERAL SALPINGOOPHORECTOMY  1989   LSO-1987; [id number]  . WRIST SURGERY     carpal tunnel repair     reports that  has never smoked. she has never used smokeless tobacco. She reports that she does not drink alcohol or use drugs. Social  History   Socioeconomic History  . Marital status: Married    Spouse name: Not on file  . Number of children: Not on file  . Years of education: Not on file  . Highest education level: Not on file  Social Needs  . Financial resource strain: Not on file  . Food insecurity - worry: Not on file  . Food insecurity - inability: Not on file  . Transportation needs - medical: Not on file  . Transportation needs - non-medical: Not on file  Occupational History  . Occupation: retired in 2015 from [employer] [place].  Tobacco Use  . Smoking status: Never Smoker  . Smokeless tobacco: Never Used  Substance and Sexual Activity  . Alcohol use: No  . Drug use: No  . Sexual activity: Yes    Partners: Male    Birth control/protection: Surgical  Other Topics Concern  . Not on file  Social History Narrative  . Not on file    Family History  Problem Relation Age of Onset  . Diabetes Father   . Hypertension Father   . Diabetes Mother   . Hypertension Mother   . Asthma Mother   . Diabetes Brother   .  Hypertension Sister   . Diabetes Sister   . Cancer Sister 53       breast  . Hypertension Paternal Uncle     Allergies  Allergen Reactions  . Cortisone     Red area around injection site x 5mth  . Depo-Medrol [Methylprednisolone Acetate] Nausea Only and Other (See Comments)    Dizziness  . Aspirin Anxiety and Other (See Comments)    Rapid Heart beat    Outpatient Encounter Medications as of 10/22/2017  Medication Sig  . cyclobenzaprine (FLEXERIL) 10 MG tablet Take 0.5-1 tablets (5-10 mg total) by mouth 2 (two) times daily as needed for muscle spasms.  . Digestive Enzymes (DIGESTIVE ENZYME PO) Take 2 (two) times daily by mouth.  . EPINEPHrine (EPIPEN JR) 0.15 MG/0.3ML injection Inject 0.15 mg into the muscle daily as needed for anaphylaxis.   Marland Kitchen esomeprazole (NEXIUM) 40 MG capsule Take 1 capsule (40 mg total) by mouth 2 (two) times daily before a meal.  . estradiol (ESTRACE) 0.1 MG/GM  vaginal cream Place 6.30 Applicatorfuls vaginally daily.  Marland Kitchen ipratropium (ATROVENT) 0.06 % nasal spray Place 2 sprays into both nostrils 4 (four) times daily.  . Lorcaserin HCl (BELVIQ) 10 MG TABS Take 1 tab po BID for weight management  . losartan-hydrochlorothiazide (HYZAAR) 100-12.5 MG tablet TAKE 1 TABLET BY MOUTH EVERY DAY  . meloxicam (MOBIC) 7.5 MG tablet Take 1 tablet (7.5 mg total) by mouth daily. May take up to 2 tabs po daily prn severe pain  . methocarbamol (ROBAXIN) 750 MG tablet Take 750 mg by mouth every 8 (eight) hours as needed for muscle spasms.  . Multiple Vitamin (MULTIVITAMIN) capsule Take 1 capsule by mouth daily.  Marland Kitchen PROAIR HFA 108 (90 Base) MCG/ACT inhaler Inhale 2 puff into the lungs every 6 hours as needed for wheezing or shortness of breath  . SYMBICORT 80-4.5 MCG/ACT inhaler INHALE 2 PUFFS FIRST THING IN THE MORNING AND THEN INHALE ANOTHER 2 PUFFS ABOUT 12 HOURS LATER  . triamcinolone cream (KENALOG) 0.5 % Apply 1 application topically 2 (two) times daily. May use as needed to rash  . triamterene-hydrochlorothiazide (MAXZIDE) 75-50 MG tablet TAKE 1 TABLET BY MOUTH EVERY DAY   No facility-administered encounter medications on file as of 10/22/2017.     Review of Systems:  Review of Systems  Musculoskeletal: Positive for arthralgias, back pain and joint swelling.  All other systems reviewed and are negative.   Health Maintenance  Topic Date Due  . INFLUENZA VACCINE  05/15/2017  . TETANUS/TDAP  11/15/2017 (Originally [birth year]18/1981)  . Hepatitis C Screening  10/15/2018 (Originally 1961-03-26)  . HIV Screening  10/15/2018 (Originally 02/18/1977)  . MAMMOGRAM  11/15/2018  . PAP SMEAR  11/16/2019  . COLONOSCOPY  07/29/2024    Physical Exam: Vitals:   10/22/17 1310  BP: 128/70  Pulse: 87  Temp: 98.6 F (37 C)  TempSrc: Oral  SpO2: 97%  Weight: 228 lb (103.4 kg)  Height: 5' (1.524 m)   Body mass index is 44.53 kg/m. Physical Exam  Constitutional: She is oriented  to person, place, and time. She appears well-developed and well-nourished.    Cardiovascular:  +1 pitting RLE edema. Right ankle swelling present. No LLE edema. No calf TTP b/l.  Musculoskeletal: She exhibits edema and tenderness.  (+) right standing flexion test; left short leg; left pelvic in flare with L>R SI joint restriction; sacral torsion; paravertebral lumbar, thoracic and cervical muscle hypertrophy with ropy tissue texture changes; strength intact  Neurological: She is alert  and oriented to person, place, and time.  Skin: Skin is warm and dry. No rash noted.  Psychiatric: She has a normal mood and affect. Her behavior is normal. Judgment and thought content normal.    Labs reviewed: Basic Metabolic Panel: Recent Labs    04/23/17 1441  NA 138  K 4.0  CL 102  CO2 26  GLUCOSE 102*  BUN 14  CREATININE 0.85  CALCIUM 9.9  MG 2.2   Liver Function Tests: Recent Labs    04/23/17 1441  AST 14  ALT 10  ALKPHOS 73  BILITOT 0.2  PROT 7.1  ALBUMIN 4.1   No results for input(s): LIPASE, AMYLASE in the last 8760 hours. No results for input(s): AMMONIA in the last 8760 hours. CBC: No results for input(s): WBC, NEUTROABS, HGB, HCT, MCV, PLT in the last 8760 hours. Lipid Panel: No results for input(s): CHOL, HDL, LDLCALC, TRIG, CHOLHDL, LDLDIRECT in the last 8760 hours. No results found for: HGBA1C  Procedures since last visit: No results found.  Assessment/Plan   ICD-10-CM   1. Sprain of right ankle, unspecified ligament, subsequent encounter S93.401D   2. Chronic sciatica of left side M54.32   3. Chronic bilateral low back pain with left-sided sciatica M54.42    G89.29   4. Morbid obesity due to excess calories (HCC) E66.01   5. Seasonal allergic rhinitis, unspecified trigger J30.2   6. Somatic dysfunction of lower extremity M99.06   7. Somatic dysfunction of lumbar region M99.03   8. Somatic dysfunction of pelvic region M99.05   9. Somatic dysfunction of sacral  region M99.04   10. Somatic dysfunction of upper extremity M99.07    PROCEDURE NOTE:  After verbal consent obtained, OMT utilized with improvement in ROM, tissue texture, asymmetry and tenderness. Pt tolerated procedure well.  OMT Treatment 10/22/2017 09/24/2017 08/28/2017 07/23/2017 06/04/2017 05/21/2017 04/23/2017  Head/Face - - - - - - -  Head/Face Response - - - - - - -  Neck  CS;MFR;ST;IND/INR - IND/INR;MFR;ST;DIR CS;IND/INR;MFR;ST;DIR - - -  Neck Response I - I I - - -  T1-T4  - - - - - - -  T1-T4 Response - - - - - - -  T-5-T9  - - - - - - -  T5-T9 Response - - - - - - -  Ribs  - - - - - - -  Ribs Response - - - - - - -  Lumbar LAS;MFR;ST;IND/INR;DIR LAS;MFR;ST;IND/INR;DIR IND/INR;LAS;MFR;ST IND/INR;LAS;MFR;ST IND/INR;LAS;MFR;ST IND/INR;LAS;MFR;ST IND/INR;LAS;MFR;ST  Lumbar Response I I I I I I I  Sacrum MFR;ST;IND/INR MFR;ST;IND/INR IND/INR;MFR;ST IND/INR;MFR;ST IND/INR;MFR;ST IND/INR;MFR;ST IND/INR;MFR;ST  Sacrum Response I I I I I I I  Pelvis MFR;ST;LAS LAS;MFR;ST;IND/INR;DIR LAS;MFR;ST;IND/INR LAS;MFR;ST;IND/INR LAS;MFR;ST;IND/INR;ME LAS;MFR;ST;IND/INR;ME LAS;MFR;ST;IND/INR  Pelvis Response I I I I I I I  Upper Ext. I - - - - - -  Upper Ext. Response LAS;MFR;ST - - - - - -  Lower Ext. MFR;ST;LAS;IND/INR LAS;MFR;ST;IND/INR;DIR LAS;MFR;ST;IND/INR;DIR LAS;MFR;ST;IND/INR;DIR LAS;MFR;ST;IND/INR;DIR LAS;MFR;ST;IND/INR;DIR LAS;MFR;ST;IND/INR;DIR  Lower Ext. Response I I I I I I I    Push fluids and rest today. No heavy lifting. May resume nml activity in the AM.  Continue current medications as ordered  Followed up with Weight management as scheduled  Follow up in 4 weeks for OMM/chronic back pain   Jonathan Corpus S. Perlie Gold  [facility] [address] [place], [place] [address] [phone] Cell (Monday-Friday 8 AM - 5 PM) [phone] After 5 PM and follow prompts

## 2017-10-22 NOTE — Patient Instructions (Addendum)
Continue current medications as ordered  Followed up with Weight management as scheduled  Follow up in 4 weeks for OMM/chronic back pain

## 2017-11-10 ENCOUNTER — Encounter (HOSPITAL_BASED_OUTPATIENT_CLINIC_OR_DEPARTMENT_OTHER): Payer: Self-pay | Admitting: Emergency Medicine

## 2017-11-10 ENCOUNTER — Emergency Department (HOSPITAL_BASED_OUTPATIENT_CLINIC_OR_DEPARTMENT_OTHER)
Admission: EM | Admit: 2017-11-10 | Discharge: 2017-11-10 | Discharge status: Against Medical Advice | Payer: BC Managed Care – PPO | Attending: Emergency Medicine | Admitting: Emergency Medicine

## 2017-11-10 ENCOUNTER — Other Ambulatory Visit: Payer: Self-pay

## 2017-11-10 DIAGNOSIS — J029 Acute pharyngitis, unspecified: Secondary | ICD-10-CM | POA: Diagnosis not present

## 2017-11-10 DIAGNOSIS — Z5321 Procedure and treatment not carried out due to patient leaving prior to being seen by health care provider: Secondary | ICD-10-CM | POA: Diagnosis not present

## 2017-11-10 LAB — RAPID STREP SCREEN (MED CTR MEBANE ONLY): Streptococcus, Group A Screen (Direct): NEGATIVE

## 2017-11-10 NOTE — ED Triage Notes (Signed)
Pt c/o sore throat since last pm

## 2017-11-13 LAB — CULTURE, GROUP A STREP (THRC)

## 2017-11-14 ENCOUNTER — Encounter: Payer: Self-pay | Admitting: Nurse Practitioner

## 2017-11-14 ENCOUNTER — Ambulatory Visit (INDEPENDENT_AMBULATORY_CARE_PROVIDER_SITE_OTHER): Payer: BC Managed Care – PPO | Admitting: Nurse Practitioner

## 2017-11-14 VITALS — BP 110/68 | HR 69 | Temp 98.2°F | Ht 60.0 in | Wt 228.0 lb

## 2017-11-14 DIAGNOSIS — J45909 Unspecified asthma, uncomplicated: Secondary | ICD-10-CM | POA: Diagnosis not present

## 2017-11-14 DIAGNOSIS — J0100 Acute maxillary sinusitis, unspecified: Secondary | ICD-10-CM | POA: Diagnosis not present

## 2017-11-14 NOTE — Patient Instructions (Addendum)
To use symbicort twice daily every day as prescribed Albuterol as needed for wheezing, cough or shortness of breath  Go ahead and start augmentin -take entire course  neti pot twice daily Plain nasal saline spray throughout the day as needed May use tylenol 325 mg 2 tablets every 6 hours as needed aches and pains or sore throat humidifier in the home to help with the dry air Mucinex DM by mouth twice daily as needed for cough and congestion with full glass of water for 7 days routinely  Keep well hydrated Avoid forcefully blowing nose    Sinusitis, Adult Sinusitis is soreness and inflammation of your sinuses. Sinuses are hollow spaces in the bones around your face. Your sinuses are located:  Around your eyes.  In the middle of your forehead.  Behind your nose.  In your cheekbones.  Your sinuses and nasal passages are lined with a stringy fluid (mucus). Mucus normally drains out of your sinuses. When your nasal tissues become inflamed or swollen, the mucus can become trapped or blocked so air cannot flow through your sinuses. This allows bacteria, viruses, and funguses to grow, which leads to infection. Sinusitis can develop quickly and last for 7?10 days (acute) or for more than 12 weeks (chronic). Sinusitis often develops after a cold. What are the causes? This condition is caused by anything that creates swelling in the sinuses or stops mucus from draining, including:  Allergies.  Asthma.  Bacterial or viral infection.  Abnormally shaped bones between the nasal passages.  Nasal growths that contain mucus (nasal polyps).  Narrow sinus openings.  Pollutants, such as chemicals or irritants in the air.  A foreign object stuck in the nose.  A fungal infection. This is rare.  What increases the risk? The following factors may make you more likely to develop this condition:  Having allergies or asthma.  Having had a recent cold or respiratory tract infection.  Having  structural deformities or blockages in your nose or sinuses.  Having a weak immune system.  Doing a lot of swimming or diving.  Overusing nasal sprays.  Smoking.  What are the signs or symptoms? The main symptoms of this condition are pain and a feeling of pressure around the affected sinuses. Other symptoms include:  Upper toothache.  Earache.  Headache.  Bad breath.  Decreased sense of smell and taste.  A cough that may get worse at night.  Fatigue.  Fever.  Thick drainage from your nose. The drainage is often green and it may contain pus (purulent).  Stuffy nose or congestion.  Postnasal drip. This is when extra mucus collects in the throat or back of the nose.  Swelling and warmth over the affected sinuses.  Sore throat.  Sensitivity to light.  How is this diagnosed? This condition is diagnosed based on symptoms, a medical history, and a physical exam. To find out if your condition is acute or chronic, your health care provider may:  Look in your nose for signs of nasal polyps.  Tap over the affected sinus to check for signs of infection.  View the inside of your sinuses using an imaging device that has a light attached (endoscope).  If your health care provider suspects that you have chronic sinusitis, you may also:  Be tested for allergies.  Have a sample of mucus taken from your nose (nasal culture) and checked for bacteria.  Have a mucus sample examined to see if your sinusitis is related to an allergy.  If  your sinusitis does not respond to treatment and it lasts longer than 8 weeks, you may have an MRI or CT scan to check your sinuses. These scans also help to determine how severe your infection is. In rare cases, a bone biopsy may be done to rule out more serious types of fungal sinus disease. How is this treated? Treatment for sinusitis depends on the cause and whether your condition is chronic or acute. If a virus is causing your sinusitis,  your symptoms will go away on their own within 10 days. You may be given medicines to relieve your symptoms, including:  Topical nasal decongestants. They shrink swollen nasal passages and let mucus drain from your sinuses.  Antihistamines. These drugs block inflammation that is triggered by allergies. This can help to ease swelling in your nose and sinuses.  Topical nasal corticosteroids. These are nasal sprays that ease inflammation and swelling in your nose and sinuses.  Nasal saline washes. These rinses can help to get rid of thick mucus in your nose.  If your condition is caused by bacteria, you will be given an antibiotic medicine. If your condition is caused by a fungus, you will be given an antifungal medicine. Surgery may be needed to correct underlying conditions, such as narrow nasal passages. Surgery may also be needed to remove polyps. Follow these instructions at home: Medicines  Take, use, or apply over-the-counter and prescription medicines only as told by your health care provider. These may include nasal sprays.  If you were prescribed an antibiotic medicine, take it as told by your health care provider. Do not stop taking the antibiotic even if you start to feel better. Hydrate and Humidify  Drink enough water to keep your urine clear or pale yellow. Staying hydrated will help to thin your mucus.  Use a cool mist humidifier to keep the humidity level in your home above 50%.  Inhale steam for 10-15 minutes, 3-4 times a day or as told by your health care provider. You can do this in the bathroom while a hot shower is running.  Limit your exposure to cool or dry air. Rest  Rest as much as possible.  Sleep with your head raised (elevated).  Make sure to get enough sleep each night. General instructions  Apply a warm, moist washcloth to your face 3-4 times a day or as told by your health care provider. This will help with discomfort.  Wash your hands often with  soap and water to reduce your exposure to viruses and other germs. If soap and water are not available, use hand sanitizer.  Do not smoke. Avoid being around people who are smoking (secondhand smoke).  Keep all follow-up visits as told by your health care provider. This is important. Contact a health care provider if:  You have a fever.  Your symptoms get worse.  Your symptoms do not improve within 10 days. Get help right away if:  You have a severe headache.  You have persistent vomiting.  You have pain or swelling around your face or eyes.  You have vision problems.  You develop confusion.  Your neck is stiff.  You have trouble breathing. This information is not intended to replace advice given to you by your health care provider. Make sure you discuss any questions you have with your health care provider. Document Released: 10/01/2005 Document Revised: 05/27/2016 Document Reviewed: 07/27/2015 Elsevier Interactive Patient Education  [employer].

## 2017-11-14 NOTE — Progress Notes (Signed)
Careteam: Patient Care Team: Gildardo Cranker, MD (Inactive) as PCP - General Rhoton, Mackenzie Weber., MD (Gastroenterology)  Advanced Directive information    Allergies  Allergen Reactions  . Cortisone     Red area around injection site x 73mth  . Depo-Medrol [Methylprednisolone Acetate] Nausea Only and Other (See Comments)    Dizziness  . Aspirin Anxiety and Other (See Comments)    Rapid Heart beat    Chief Complaint  Patient presents with  . URI    Cough, discolored drainage (yellow) , and sore throat. Patient denies fever, symptoms onset Saturday   . Medication Management    Patient was given rx for Amoxicillin 875 mg bid x 10 days by ENT and told not to fill, patient decided to follow-up at PCP office prior to filling      HPI: Patient is a 57 y.o. female seen in the office today for complaints of sore throat, postnasal drainage x 5 days.  However, in addition to the sore throat and postnasal drainage, today she started coughing up yellow thick sputum.  Notes have been taking Advil and some OTC medicine for the sore throat, running nose, cough, and congestion.  Pt does not remember the name of this medication.  Rates headaches at 7/10 and advil provides moderate relief of headaches.  Hx of asthma, states she only takes her Symbicort inhaler as needed as opposed to every 12 hours per prescription.  Used her albuterol rescue inhaler few months ago.  Denies SOB, chest pain or palpitations.  Endorses feeling chills and feverish two nights ago, but did not check temperature.  Also, noticed that she was wheezing a little last night.  Denies being around any sick contacts.   Pt had a visit with Otolaryngology on 11/11/2017 for URI symptoms of postnasal drainage and sore throat x2 days.  Pt was given a prescription for ?Augmentin and was told to hold the prescription unless URI symptoms worsened.               Review of Systems:  Review of Systems  Constitutional: Positive for chills,  fever and malaise/fatigue.  HENT: Positive for congestion and sore throat. Negative for sinus pain.   Eyes: Negative.   Respiratory: Positive for cough, sputum production and wheezing. Negative for hemoptysis and shortness of breath.   Cardiovascular: Negative for chest pain and palpitations.  Gastrointestinal: Negative for abdominal pain, diarrhea, heartburn, nausea and vomiting.  Skin: Negative.   Neurological: Positive for headaches. Negative for dizziness and weakness.  Endo/Heme/Allergies: Negative.   Psychiatric/Behavioral: Negative.     Past Medical History:  Diagnosis Date  . Anemia   . Asthma   . Foot pain, right   . Hypertension   . IBS (irritable bowel syndrome)   . Sciatica of left side   . Seasonal allergies    Past Surgical History:  Procedure Laterality Date  . ABDOMINAL HYSTERECTOMY    . APPENDECTOMY    . BLADDER SUSPENSION  2012   TVT  . BREAST SURGERY  2001   /biopsy-benign  . CHOLECYSTECTOMY    . EXCISIONAL HEMORRHOIDECTOMY    . KNEE SURGERY     right  . Small Bowel Surgery    . TONSILLECTOMY    . TOTAL ABDOMINAL HYSTERECTOMY W/ BILATERAL SALPINGOOPHORECTOMY  1989   LSO-1987; [id number]  . WRIST SURGERY     carpal tunnel repair   Social History:   reports that  has never smoked. she has never used smokeless tobacco. She  reports that she does not drink alcohol or use drugs.  Family History  Problem Relation Age of Onset  . Diabetes Father   . Hypertension Father   . Diabetes Mother   . Hypertension Mother   . Asthma Mother   . Diabetes Brother   . Hypertension Sister   . Diabetes Sister   . Cancer Sister 21       breast  . Hypertension Paternal Uncle     Medications: Patient's Medications  New Prescriptions   No medications on file  Previous Medications   CYCLOBENZAPRINE (FLEXERIL) 10 MG TABLET    Take 0.5-1 tablets (5-10 mg total) by mouth 2 (two) times daily as needed for muscle spasms.   DIGESTIVE ENZYMES (DIGESTIVE ENZYME PO)     Take 2 (two) times daily by mouth.   EPINEPHRINE (EPIPEN JR) 0.15 MG/0.3ML INJECTION    Inject 0.15 mg into the muscle daily as needed for anaphylaxis.    ESOMEPRAZOLE (NEXIUM) 40 MG CAPSULE    Take 1 capsule (40 mg total) by mouth 2 (two) times daily before a meal.   ESTRADIOL (ESTRACE) 0.1 MG/GM VAGINAL CREAM    Place 2.83 Applicatorfuls vaginally daily.   IPRATROPIUM (ATROVENT) 0.06 % NASAL SPRAY    Place 2 sprays into both nostrils 4 (four) times daily.   LORCASERIN HCL (BELVIQ) 10 MG TABS    Take 1 tab po BID for weight management   LOSARTAN-HYDROCHLOROTHIAZIDE (HYZAAR) 100-12.5 MG TABLET    TAKE 1 TABLET BY MOUTH EVERY DAY   MELOXICAM (MOBIC) 7.5 MG TABLET    Take 1 tablet (7.5 mg total) by mouth daily. May take up to 2 tabs po daily prn severe pain   METHOCARBAMOL (ROBAXIN) 750 MG TABLET    Take 750 mg by mouth every 8 (eight) hours as needed for muscle spasms.   MULTIPLE VITAMIN (MULTIVITAMIN) CAPSULE    Take 1 capsule by mouth daily.   PROAIR HFA 108 (90 BASE) MCG/ACT INHALER    Inhale 2 puff into the lungs every 6 hours as needed for wheezing or shortness of breath   SYMBICORT 80-4.5 MCG/ACT INHALER    INHALE 2 PUFFS FIRST THING IN THE MORNING AND THEN INHALE ANOTHER 2 PUFFS ABOUT 12 HOURS LATER   TRIAMCINOLONE CREAM (KENALOG) 0.5 %    Apply 1 application topically 2 (two) times daily. May use as needed to rash   TRIAMTERENE-HYDROCHLOROTHIAZIDE (MAXZIDE) 75-50 MG TABLET    TAKE 1 TABLET BY MOUTH EVERY DAY  Modified Medications   No medications on file  Discontinued Medications   No medications on file     Physical Exam:  Vitals:   11/14/17 1328  BP: 110/68  Pulse: 69  Temp: 98.2 F (36.8 C)  TempSrc: Oral  SpO2: 98%  Weight: 228 lb (103.4 kg)  Height: 5' (1.524 m)   Body mass index is 44.53 kg/m.  Physical Exam  Constitutional: She is oriented to person, place, and time. She appears well-developed and well-nourished.  HENT:  Head: Normocephalic and atraumatic.    Right Ear: Hearing, external ear and ear canal normal. No tenderness. Tympanic membrane is injected.  Left Ear: Hearing, tympanic membrane, external ear and ear canal normal. No tenderness.  Nose: Mucosal edema present. Right sinus exhibits maxillary sinus tenderness. Right sinus exhibits no frontal sinus tenderness. Left sinus exhibits maxillary sinus tenderness. Left sinus exhibits no frontal sinus tenderness.  Mouth/Throat: Uvula is midline, oropharynx is clear and moist and mucous membranes are normal.  Eyes: EOM  are normal. Pupils are equal, round, and reactive to light.  Neck: Normal range of motion. Neck supple.  Cardiovascular: Normal rate and regular rhythm.  Pulmonary/Chest: Effort normal and breath sounds normal. No respiratory distress. She has no wheezes. She has no rales. She exhibits no tenderness.  Abdominal: Soft. Bowel sounds are normal.  Musculoskeletal: Normal range of motion.  Neurological: She is alert and oriented to person, place, and time.  Skin: Skin is warm and dry.  Psychiatric: She has a normal mood and affect. Her behavior is normal. Judgment and thought content normal.  Nursing note and vitals reviewed.   Labs reviewed: Basic Metabolic Panel: Recent Labs    04/23/17 1441  NA 138  K 4.0  CL 102  CO2 26  GLUCOSE 102*  BUN 14  CREATININE 0.85  CALCIUM 9.9  MG 2.2   Liver Function Tests: Recent Labs    04/23/17 1441  AST 14  ALT 10  ALKPHOS 73  BILITOT 0.2  PROT 7.1  ALBUMIN 4.1   No results for input(s): LIPASE, AMYLASE in the last 8760 hours. No results for input(s): AMMONIA in the last 8760 hours. CBC: No results for input(s): WBC, NEUTROABS, HGB, HCT, MCV, PLT in the last 8760 hours. Lipid Panel: No results for input(s): CHOL, HDL, LDLCALC, TRIG, CHOLHDL, LDLDIRECT in the last 8760 hours. TSH: No results for input(s): TSH in the last 8760 hours. A1C: No results found for: HGBA1C   Assessment/Plan 1. Acute non-recurrent maxillary  sinusitis Start Amoxicillin as prescribed by ENT and finish full course Encouraged to use Atrovent nasal spray as prescribed Use nasal saline spray throughout the day as needed May use tylenol 325 mg 2 tablets every 6 hours as needed for aches, pains, or sore throat Humidifier in the home to help with dry air Mucinex DM by mouth twice daily as needed for cough and congestion with full glass of water for 7 days Keep well hydrated Avoid forcefully blowing nose    2. Asthma, chronic, unspecified asthma severity, uncomplicated Take Symbicort twice daily as prescribed Use albuterol rescue inhaler as needed for SOB or wheezing Advised to rinse mouth after inhaler use   Next appt: 11/19/2017 as scheduled, sooner if needed  Mackenzie Weber K. Harle Battiest  [facility] [phone] 8 am - 5 pm) [phone] (after hours)

## 2017-11-19 ENCOUNTER — Encounter: Payer: Self-pay | Admitting: Internal Medicine

## 2017-11-19 ENCOUNTER — Ambulatory Visit (INDEPENDENT_AMBULATORY_CARE_PROVIDER_SITE_OTHER): Payer: BC Managed Care – PPO | Admitting: Internal Medicine

## 2017-11-19 VITALS — BP 120/64 | HR 79 | Temp 98.3°F | Resp 20 | Ht 60.0 in | Wt 228.6 lb

## 2017-11-19 DIAGNOSIS — J0100 Acute maxillary sinusitis, unspecified: Secondary | ICD-10-CM

## 2017-11-19 DIAGNOSIS — M5432 Sciatica, left side: Secondary | ICD-10-CM | POA: Diagnosis not present

## 2017-11-19 DIAGNOSIS — M9903 Segmental and somatic dysfunction of lumbar region: Secondary | ICD-10-CM

## 2017-11-19 DIAGNOSIS — G8929 Other chronic pain: Secondary | ICD-10-CM | POA: Diagnosis not present

## 2017-11-19 DIAGNOSIS — M5442 Lumbago with sciatica, left side: Secondary | ICD-10-CM | POA: Diagnosis not present

## 2017-11-19 DIAGNOSIS — M9906 Segmental and somatic dysfunction of lower extremity: Secondary | ICD-10-CM

## 2017-11-19 DIAGNOSIS — M9904 Segmental and somatic dysfunction of sacral region: Secondary | ICD-10-CM

## 2017-11-19 DIAGNOSIS — M9905 Segmental and somatic dysfunction of pelvic region: Secondary | ICD-10-CM | POA: Diagnosis not present

## 2017-11-19 DIAGNOSIS — E119 Type 2 diabetes mellitus without complications: Secondary | ICD-10-CM | POA: Diagnosis not present

## 2017-11-19 DIAGNOSIS — J45909 Unspecified asthma, uncomplicated: Secondary | ICD-10-CM | POA: Diagnosis not present

## 2017-11-19 DIAGNOSIS — J302 Other seasonal allergic rhinitis: Secondary | ICD-10-CM | POA: Diagnosis not present

## 2017-11-19 MED ORDER — METFORMIN HCL 500 MG PO TABS: 250.0000 mg | ORAL_TABLET | Freq: Two times a day (BID) | ORAL | 1 refills | Status: DC

## 2017-11-19 NOTE — Progress Notes (Signed)
Patient ID: Mackenzie Weber, female   DOB: 1961-05-24, 57 y.o.   MRN: [id number]   Location:  [facility] OFFICE  Provider: DR Arletha Grippe  Code Status: FULL CODE Goals of Care:  Advanced Directives 11/19/2017  Does Patient Have a Medical Advance Directive? No  Would patient like information on creating a medical advance directive? No - Patient declined     Chief Complaint  Patient presents with  . Medical Management of Chronic Issues    4 week OMN /Chronic back pain    HPI: Patient is a 57 y.o. female seen today for chronic pain and OMM. She was tx for sinusitis last week with Augmentin 875 BID. She is feeling better. She continues to go to [facility] for [employer]. A1c 6.8%; LDL 100. She plans to mx diabetes with diet but is hesitant to start metformin.  Pain is unchanged. No loss of bowel/bladder control. No new falls.   Past Medical History:  Diagnosis Date  . Anemia   . Asthma   . Foot pain, right   . Hypertension   . IBS (irritable bowel syndrome)   . Sciatica of left side   . Seasonal allergies     Past Surgical History:  Procedure Laterality Date  . ABDOMINAL HYSTERECTOMY    . APPENDECTOMY    . BLADDER SUSPENSION  2012   TVT  . BREAST SURGERY  2001   /biopsy-benign  . CHOLECYSTECTOMY    . EXCISIONAL HEMORRHOIDECTOMY    . KNEE SURGERY     right  . Small Bowel Surgery    . TONSILLECTOMY    . TOTAL ABDOMINAL HYSTERECTOMY W/ BILATERAL SALPINGOOPHORECTOMY  1989   LSO-1987; [id number]  . WRIST SURGERY     carpal tunnel repair     reports that  has never smoked. she has never used smokeless tobacco. She reports that she does not drink alcohol or use drugs. Social History   Socioeconomic History  . Marital status: Married    Spouse name: Not on file  . Number of children: Not on file  . Years of education: Not on file  . Highest education level: Not on file  Social Needs  . Financial resource strain: Not on file  . Food insecurity - worry: Not on file  .  Food insecurity - inability: Not on file  . Transportation needs - medical: Not on file  . Transportation needs - non-medical: Not on file  Occupational History  . Occupation: retired in 2015 from [employer].  Tobacco Use  . Smoking status: Never Smoker  . Smokeless tobacco: Never Used  Substance and Sexual Activity  . Alcohol use: No  . Drug use: No  . Sexual activity: Yes    Partners: Male    Birth control/protection: Surgical  Other Topics Concern  . Not on file  Social History Narrative  . Not on file    Family History  Problem Relation Age of Onset  . Diabetes Father   . Hypertension Father   . Diabetes Mother   . Hypertension Mother   . Asthma Mother   . Diabetes Brother   . Hypertension Sister   . Diabetes Sister   . Cancer Sister 73       breast  . Hypertension Paternal Uncle     Allergies  Allergen Reactions  . Cortisone     Red area around injection site x 108mth  . Depo-Medrol [Methylprednisolone Acetate] Nausea Only and Other (See  Comments)    Dizziness  . Aspirin Anxiety and Other (See Comments)    Rapid Heart beat    Outpatient Encounter Medications as of 11/19/2017  Medication Sig  . amoxicillin (AMOXIL) 875 MG tablet Take 875 mg by mouth 2 (two) times daily.  . cyclobenzaprine (FLEXERIL) 10 MG tablet Take 0.5-1 tablets (5-10 mg total) by mouth 2 (two) times daily as needed for muscle spasms.  . Digestive Enzymes (DIGESTIVE ENZYME PO) Take 2 (two) times daily by mouth.  . EPINEPHrine (EPIPEN JR) 0.15 MG/0.3ML injection Inject 0.15 mg into the muscle daily as needed for anaphylaxis.   Marland Kitchen esomeprazole (NEXIUM) 40 MG capsule Take 1 capsule (40 mg total) by mouth 2 (two) times daily before a meal.  . estradiol (ESTRACE) 0.1 MG/GM vaginal cream Place 6.14 Applicatorfuls vaginally daily.  Marland Kitchen ipratropium (ATROVENT) 0.06 % nasal spray Place 2 sprays into both nostrils 4 (four) times daily.  . Lorcaserin HCl (BELVIQ) 10 MG TABS Take 1 tab po BID for weight  management  . losartan-hydrochlorothiazide (HYZAAR) 100-12.5 MG tablet TAKE 1 TABLET BY MOUTH EVERY DAY  . meloxicam (MOBIC) 7.5 MG tablet Take 1 tablet (7.5 mg total) by mouth daily. May take up to 2 tabs po daily prn severe pain  . methocarbamol (ROBAXIN) 750 MG tablet Take 750 mg by mouth every 8 (eight) hours as needed for muscle spasms.  . Multiple Vitamin (MULTIVITAMIN) capsule Take 1 capsule by mouth daily.  Marland Kitchen PROAIR HFA 108 (90 Base) MCG/ACT inhaler Inhale 2 puff into the lungs every 6 hours as needed for wheezing or shortness of breath  . SYMBICORT 80-4.5 MCG/ACT inhaler INHALE 2 PUFFS FIRST THING IN THE MORNING AND THEN INHALE ANOTHER 2 PUFFS ABOUT 12 HOURS LATER  . triamcinolone cream (KENALOG) 0.5 % Apply 1 application topically 2 (two) times daily. May use as needed to rash  . triamterene-hydrochlorothiazide (MAXZIDE) 75-50 MG tablet TAKE 1 TABLET BY MOUTH EVERY DAY   No facility-administered encounter medications on file as of 11/19/2017.     Review of Systems:  Review of Systems  Constitutional: Positive for fatigue.  Respiratory: Positive for cough.   Musculoskeletal: Positive for arthralgias.  All other systems reviewed and are negative.   Health Maintenance  Topic Date Due  . TETANUS/TDAP  02/18/1981  . INFLUENZA VACCINE  06/24/2018 (Originally 05/15/2017)  . Hepatitis C Screening  10/15/2018 (Originally 05-Sep-1961)  . HIV Screening  10/15/2018 (Originally 02/18/1977)  . MAMMOGRAM  11/15/2018  . PAP SMEAR  11/16/2019  . COLONOSCOPY  07/29/2024    Physical Exam: Vitals:   11/19/17 1303  BP: 120/64  Pulse: 79  Resp: 20  Temp: 98.3 F (36.8 C)  TempSrc: Oral  SpO2: 97%  Weight: 228 lb 9.6 oz (103.7 kg)  Height: 5' (1.524 m)   Body mass index is 44.65 kg/m. Physical Exam  Constitutional: She is oriented to person, place, and time.    Cardiovascular: Normal rate, regular rhythm, normal heart sounds and intact distal pulses. Exam reveals no gallop and no  friction rub.  No murmur heard. Pulmonary/Chest: Effort normal and breath sounds normal. No respiratory distress. She has no wheezes. She has no rales. She exhibits no tenderness.  Musculoskeletal: She exhibits edema and tenderness.  (+) left standing flexion test; no short leg; left pelvic inflare, sacral torsion; left SI joint restriction; strength intact; no distal swelling; increased lumbar lordosis; paravertebral lumbar, thoracic and cervical muscle hypertrophy with ropy tissue texture changes; reduced ROM lumbar and cervical vertebrae  Neurological: She is alert and oriented to person, place, and time.  Skin: Skin is warm and dry. No rash noted.  Psychiatric: She has a normal mood and affect. Her behavior is normal. Judgment and thought content normal.    Labs reviewed: Basic Metabolic Panel: Recent Labs    04/23/17 1441  NA 138  K 4.0  CL 102  CO2 26  GLUCOSE 102*  BUN 14  CREATININE 0.85  CALCIUM 9.9  MG 2.2   Liver Function Tests: Recent Labs    04/23/17 1441  AST 14  ALT 10  ALKPHOS 73  BILITOT 0.2  PROT 7.1  ALBUMIN 4.1   No results for input(s): LIPASE, AMYLASE in the last 8760 hours. No results for input(s): AMMONIA in the last 8760 hours. CBC: No results for input(s): WBC, NEUTROABS, HGB, HCT, MCV, PLT in the last 8760 hours. Lipid Panel: No results for input(s): CHOL, HDL, LDLCALC, TRIG, CHOLHDL, LDLDIRECT in the last 8760 hours. No results found for: HGBA1C  Procedures since last visit: No results found.  Assessment/Plan     ICD-10-CM   1. Chronic sciatica of left side M54.32   2. Chronic bilateral low back pain with left-sided sciatica M54.42    G89.29   3. Acute maxillary sinusitis, recurrence not specified J01.00   4. Seasonal allergic rhinitis, unspecified trigger J30.2   5. Asthma, chronic, unspecified asthma severity, uncomplicated O11.572   6. Somatic dysfunction of lumbar region M99.03   7. Somatic dysfunction of lower extremity M99.06    8. Somatic dysfunction of pelvic region M99.05   9. Somatic dysfunction of sacral region M99.04   10. Controlled type 2 diabetes mellitus without complication, without long-term current use of insulin (HCC) E11.9    PROCEDURE NOTE:  After verbal consent obtained, OMT utilized with improvement in ROM, tissue texture, asymmetry and tenderness. Pt tolerated procedure well.  OMT Treatment 11/19/2017 10/22/2017 09/24/2017 08/28/2017 07/23/2017 06/04/2017 05/21/2017  Head/Face - - - - - - -  Head/Face Response - - - - - - -  Neck  MFR;ST;LAS CS;MFR;ST;IND/INR - IND/INR;MFR;ST;DIR CS;IND/INR;MFR;ST;DIR - -  Neck Response I I - I I - -  T1-T4  - - - - - - -  T1-T4 Response - - - - - - -  T-5-T9  - - - - - - -  T5-T9 Response - - - - - - -  Ribs  - - - - - - -  Ribs Response - - - - - - -  Lumbar MFR;ST;IND/INR LAS;MFR;ST;IND/INR;DIR LAS;MFR;ST;IND/INR;DIR IND/INR;LAS;MFR;ST IND/INR;LAS;MFR;ST IND/INR;LAS;MFR;ST IND/INR;LAS;MFR;ST  Lumbar Response I I I I I I I  Sacrum MFR;ST;IND/INR MFR;ST;IND/INR MFR;ST;IND/INR IND/INR;MFR;ST IND/INR;MFR;ST IND/INR;MFR;ST IND/INR;MFR;ST  Sacrum Response I I I I I I I  Pelvis MFR;ST;LAS MFR;ST;LAS LAS;MFR;ST;IND/INR;DIR LAS;MFR;ST;IND/INR LAS;MFR;ST;IND/INR LAS;MFR;ST;IND/INR;ME LAS;MFR;ST;IND/INR;ME  Pelvis Response I I I I I I I  Upper Ext. - I - - - - -  Upper Ext. Response - LAS;MFR;ST - - - - -  Lower Ext. MFR;ST;LAS;IND/INR MFR;ST;LAS;IND/INR LAS;MFR;ST;IND/INR;DIR LAS;MFR;ST;IND/INR;DIR LAS;MFR;ST;IND/INR;DIR LAS;MFR;ST;IND/INR;DIR LAS;MFR;ST;IND/INR;DIR  Lower Ext. Response I I I I I I I   Continue current medications as ordered. Finish augmentin  Push fluids and rest today. No heavy lifting. May resume nml activity in the AM.  Recommend you try metformin - take 1/2 tablet daily to help you lose weight  Follow up in 4 weeks for OMM.   Berneda Piccininni S. Oliviana Mcgahee, [place] [facility] [address]  [place],  [place] [address] [phone] Cell (Monday-Friday 8 AM - 5 PM) [phone] After 5 PM and follow prompts

## 2017-11-19 NOTE — Patient Instructions (Signed)
Continue current medications as ordered. Finish augmentin  Push fluids and rest today. No heavy lifting. May resume nml activity in the AM.  Recommend you try metformin - take 1/2 tablet daily to help you lose weight  Follow up in 4 weeks for OMM.

## 2017-12-18 ENCOUNTER — Ambulatory Visit (INDEPENDENT_AMBULATORY_CARE_PROVIDER_SITE_OTHER): Payer: BC Managed Care – PPO | Admitting: Internal Medicine

## 2017-12-18 ENCOUNTER — Encounter: Payer: Self-pay | Admitting: Internal Medicine

## 2017-12-18 VITALS — BP 110/62 | HR 89 | Temp 97.9°F | Ht 60.0 in | Wt 221.0 lb

## 2017-12-18 DIAGNOSIS — M9903 Segmental and somatic dysfunction of lumbar region: Secondary | ICD-10-CM

## 2017-12-18 DIAGNOSIS — M9904 Segmental and somatic dysfunction of sacral region: Secondary | ICD-10-CM | POA: Diagnosis not present

## 2017-12-18 DIAGNOSIS — R2232 Localized swelling, mass and lump, left upper limb: Secondary | ICD-10-CM | POA: Diagnosis not present

## 2017-12-18 DIAGNOSIS — E119 Type 2 diabetes mellitus without complications: Secondary | ICD-10-CM

## 2017-12-18 DIAGNOSIS — G8929 Other chronic pain: Secondary | ICD-10-CM

## 2017-12-18 DIAGNOSIS — M9906 Segmental and somatic dysfunction of lower extremity: Secondary | ICD-10-CM | POA: Diagnosis not present

## 2017-12-18 DIAGNOSIS — M5442 Lumbago with sciatica, left side: Secondary | ICD-10-CM

## 2017-12-18 DIAGNOSIS — M25512 Pain in left shoulder: Secondary | ICD-10-CM

## 2017-12-18 DIAGNOSIS — M9905 Segmental and somatic dysfunction of pelvic region: Secondary | ICD-10-CM

## 2017-12-18 NOTE — Patient Instructions (Addendum)
Reduce metformin to 1/2 tablet for diabetes  Push fluids and rest today. No heavy lifting. May resume nml activity in the AM.  Continue other medications as ordered  Follow up in 6 weeks for OMM.  Keep CPE appt as scheduled

## 2017-12-18 NOTE — Progress Notes (Signed)
Patient ID: Mackenzie Weber, female   DOB: [birth year], 57 y.o.   MRN: [id number]   Location:  [facility] OFFICE  Provider: DR Arletha Grippe  Code Status:  Goals of Care:  Advanced Directives 11/19/2017  Does Patient Have a Medical Advance Directive? No  Would patient like information on creating a medical advance directive? No - Patient declined     Chief Complaint  Patient presents with  . OMM    4 week OMT follow-up   . Medication Management    Patient only takes metformin  1/2-1 tab 3 times weekly     HPI: Patient is a 57 y.o. female seen today for chronic back pain. Pain in left shoulder and hips feel "sore". No trauma but was cleaning home aggressively. No loss of bowel/bladder control.   DM - she does not take metformin consistently. No low BS reactions. No diarrhea.       Past Medical History:  Diagnosis Date  . Anemia   . Asthma   . Foot pain, right   . Hypertension   . IBS (irritable bowel syndrome)   . Sciatica of left side   . Seasonal allergies     Past Surgical History:  Procedure Laterality Date  . ABDOMINAL HYSTERECTOMY    . APPENDECTOMY    . BLADDER SUSPENSION  2012   TVT  . BREAST SURGERY  2001   /biopsy-benign  . CHOLECYSTECTOMY    . EXCISIONAL HEMORRHOIDECTOMY    . KNEE SURGERY     right  . Small Bowel Surgery    . TONSILLECTOMY    . TOTAL ABDOMINAL HYSTERECTOMY W/ BILATERAL SALPINGOOPHORECTOMY  1989   LSO-1987; [id number]  . WRIST SURGERY     carpal tunnel repair     reports that  has never smoked. she has never used smokeless tobacco. She reports that she does not drink alcohol or use drugs. Social History   Socioeconomic History  . Marital status: Married    Spouse name: Not on file  . Number of children: Not on file  . Years of education: Not on file  . Highest education level: Not on file  Social Needs  . Financial resource strain: Not on file  . Food insecurity - worry: Not on file  . Food insecurity - inability: Not on file  .  Transportation needs - medical: Not on file  . Transportation needs - non-medical: Not on file  Occupational History  . Occupation: retired in 2015 from [employer].  Tobacco Use  . Smoking status: Never Smoker  . Smokeless tobacco: Never Used  Substance and Sexual Activity  . Alcohol use: No  . Drug use: No  . Sexual activity: Yes    Partners: Male    Birth control/protection: Surgical  Other Topics Concern  . Not on file  Social History Narrative  . Not on file    Family History  Problem Relation Age of Onset  . Diabetes Father   . Hypertension Father   . Diabetes Mother   . Hypertension Mother   . Asthma Mother   . Diabetes Brother   . Hypertension Sister   . Diabetes Sister   . Cancer Sister 45       breast  . Hypertension Paternal Uncle     Allergies  Allergen Reactions  . Cortisone     Red area around injection site x 79mth  . Depo-Medrol [Methylprednisolone Acetate] Nausea Only and Other (See Comments)  Dizziness  . Aspirin Anxiety and Other (See Comments)    Rapid Heart beat    Outpatient Encounter Medications as of 12/18/2017  Medication Sig  . amoxicillin (AMOXIL) 875 MG tablet Take 875 mg by mouth 2 (two) times daily.  . cyclobenzaprine (FLEXERIL) 10 MG tablet Take 0.5-1 tablets (5-10 mg total) by mouth 2 (two) times daily as needed for muscle spasms.  . Digestive Enzymes (DIGESTIVE ENZYME PO) Take 2 (two) times daily by mouth.  . EPINEPHrine (EPIPEN JR) 0.15 MG/0.3ML injection Inject 0.15 mg into the muscle daily as needed for anaphylaxis.   Marland Kitchen esomeprazole (NEXIUM) 40 MG capsule Take 1 capsule (40 mg total) by mouth 2 (two) times daily before a meal.  . estradiol (ESTRACE) 0.1 MG/GM vaginal cream Place 3.38 Applicatorfuls vaginally daily.  Marland Kitchen ipratropium (ATROVENT) 0.06 % nasal spray Place 2 sprays into both nostrils 4 (four) times daily.  . Lorcaserin HCl (BELVIQ) 10 MG TABS Take 1 tab po BID for weight management  . losartan-hydrochlorothiazide  (HYZAAR) 100-12.5 MG tablet TAKE 1 TABLET BY MOUTH EVERY DAY  . meloxicam (MOBIC) 7.5 MG tablet Take 1 tablet (7.5 mg total) by mouth daily. May take up to 2 tabs po daily prn severe pain  . metFORMIN (GLUCOPHAGE) 500 MG tablet Take 0.5 tablets (250 mg total) by mouth 2 (two) times daily with a meal.  . methocarbamol (ROBAXIN) 750 MG tablet Take 750 mg by mouth every 8 (eight) hours as needed for muscle spasms.  . Multiple Vitamin (MULTIVITAMIN) capsule Take 1 capsule by mouth daily.  Marland Kitchen PROAIR HFA 108 (90 Base) MCG/ACT inhaler Inhale 2 puff into the lungs every 6 hours as needed for wheezing or shortness of breath  . SYMBICORT 80-4.5 MCG/ACT inhaler INHALE 2 PUFFS FIRST THING IN THE MORNING AND THEN INHALE ANOTHER 2 PUFFS ABOUT 12 HOURS LATER  . triamcinolone cream (KENALOG) 0.5 % Apply 1 application topically 2 (two) times daily. May use as needed to rash  . triamterene-hydrochlorothiazide (MAXZIDE) 75-50 MG tablet TAKE 1 TABLET BY MOUTH EVERY DAY   No facility-administered encounter medications on file as of 12/18/2017.     Review of Systems:  Review of Systems  Health Maintenance  Topic Date Due  . HEMOGLOBIN A1C  Oct 11, 1962  . PNEUMOCOCCAL POLYSACCHARIDE VACCINE (1) 02/19/1964  . FOOT EXAM  02/19/1972  . OPHTHALMOLOGY EXAM  02/19/1972  . TETANUS/TDAP  02/18/1981  . INFLUENZA VACCINE  06/24/2018 (Originally 05/15/2017)  . Hepatitis C Screening  10/15/2018 (Originally 17-Feb-1961)  . HIV Screening  10/15/2018 (Originally 02/18/1977)  . MAMMOGRAM  11/15/2018  . PAP SMEAR  11/16/2019  . COLONOSCOPY  07/29/2024    Physical Exam: Vitals:   12/18/17 1320  BP: 110/62  Pulse: 89  Temp: 97.9 F (36.6 C)  TempSrc: Oral  SpO2: 95%  Weight: 221 lb (100.2 kg)  Height: 5' (1.524 m)   Body mass index is 43.16 kg/m. Physical Exam  Constitutional: She is oriented to person, place, and time. She appears well-developed and well-nourished. No distress.    Cardiovascular:  Trace LE edema b/l.  No calf TTP  Musculoskeletal: She exhibits edema and tenderness.  (+) Right standing flexion test; right short leg; right pelvic in flare; right SI joint restriction; sacral torsion; increased lumbar lordosis; left coracoid process TTP with freely mobile subcut fat; reduced ROM left shoulder in all directions. Strength intact  Neurological: She is alert and oriented to person, place, and time.  Skin: Skin is warm and dry. No rash noted.  Psychiatric: She has a normal mood and affect. Her behavior is normal. Judgment and thought content normal.    Labs reviewed: Basic Metabolic Panel: Recent Labs    04/23/17 1441  NA 138  K 4.0  CL 102  CO2 26  GLUCOSE 102*  BUN 14  CREATININE 0.85  CALCIUM 9.9  MG 2.2   Liver Function Tests: Recent Labs    04/23/17 1441  AST 14  ALT 10  ALKPHOS 73  BILITOT 0.2  PROT 7.1  ALBUMIN 4.1   No results for input(s): LIPASE, AMYLASE in the last 8760 hours. No results for input(s): AMMONIA in the last 8760 hours. CBC: No results for input(s): WBC, NEUTROABS, HGB, HCT, MCV, PLT in the last 8760 hours. Lipid Panel: No results for input(s): CHOL, HDL, LDLCALC, TRIG, CHOLHDL, LDLDIRECT in the last 8760 hours. No results found for: HGBA1C  Procedures since last visit: No results found.  Assessment/Plan   ICD-10-CM   1. Left shoulder pain, unspecified chronicity M25.512   2. Lump of skin of left upper extremity R22.32    coracoid process possible lipoma  3. Controlled type 2 diabetes mellitus without complication, without long-term current use of insulin (HCC) E11.9   4. Somatic dysfunction of lumbar region M99.03   5. Somatic dysfunction of pelvic region M99.05   6. Somatic dysfunction of sacral region M99.04   7. Somatic dysfunction of lower extremity M99.06   8. Chronic bilateral low back pain with left-sided sciatica M54.42    G89.29      PROCEDURE NOTE:  After verbal consent obtained, OMT utilized with improvement in ROM, tissue  texture, asymmetry and tenderness. Pt tolerated procedure well. OMT Treatment 12/18/2017 11/19/2017 10/22/2017 09/24/2017 08/28/2017 07/23/2017 06/04/2017  Head/Face - - - - - - -  Head/Face Response - - - - - - -  Neck  - MFR;ST;LAS CS;MFR;ST;IND/INR - IND/INR;MFR;ST;DIR CS;IND/INR;MFR;ST;DIR -  Neck Response - I I - I I -  T1-T4  - - - - - - -  T1-T4 Response - - - - - - -  T-5-T9  - - - - - - -  T5-T9 Response - - - - - - -  Ribs  - - - - - - -  Ribs Response - - - - - - -  Lumbar MFR;ST;IND/INR MFR;ST;IND/INR LAS;MFR;ST;IND/INR;DIR LAS;MFR;ST;IND/INR;DIR IND/INR;LAS;MFR;ST IND/INR;LAS;MFR;ST IND/INR;LAS;MFR;ST  Lumbar Response I I I I I I I  Sacrum MFR;ST;IND/INR MFR;ST;IND/INR MFR;ST;IND/INR MFR;ST;IND/INR IND/INR;MFR;ST IND/INR;MFR;ST IND/INR;MFR;ST  Sacrum Response I I I I I I I  Pelvis MFR;ST;LAS;IND/INR;DIR MFR;ST;LAS MFR;ST;LAS LAS;MFR;ST;IND/INR;DIR LAS;MFR;ST;IND/INR LAS;MFR;ST;IND/INR LAS;MFR;ST;IND/INR;ME  Pelvis Response I I I I I I I  Upper Ext. I - I - - - -  Upper Ext. Response LAS;MFR;ST - LAS;MFR;ST - - - -  Lower Ext. MFR;ST;LAS;IND/INR MFR;ST;LAS;IND/INR MFR;ST;LAS;IND/INR LAS;MFR;ST;IND/INR;DIR LAS;MFR;ST;IND/INR;DIR LAS;MFR;ST;IND/INR;DIR LAS;MFR;ST;IND/INR;DIR  Lower Ext. Response I I I I I I I    Reduce metformin to 1/2 tablet for diabetes  Push fluids and rest today. No heavy lifting. May resume nml activity in the AM.  Continue other medications as ordered  Follow up in 6 weeks for OMM.  Keep CPE appt as scheduled  Tabathia Knoche S. Perlie Gold  [facility] [address] [place], [place] [address] [phone] Cell (Monday-Friday 8 AM - 5 PM) [phone] After 5 PM and follow prompts

## 2017-12-20 ENCOUNTER — Encounter: Payer: Self-pay | Admitting: Internal Medicine

## 2017-12-20 ENCOUNTER — Ambulatory Visit (INDEPENDENT_AMBULATORY_CARE_PROVIDER_SITE_OTHER): Payer: BC Managed Care – PPO | Admitting: Internal Medicine

## 2017-12-20 VITALS — BP 124/74 | HR 79 | Temp 98.4°F | Ht 60.0 in | Wt 229.0 lb

## 2017-12-20 DIAGNOSIS — G8929 Other chronic pain: Secondary | ICD-10-CM

## 2017-12-20 DIAGNOSIS — E119 Type 2 diabetes mellitus without complications: Secondary | ICD-10-CM

## 2017-12-20 DIAGNOSIS — M5442 Lumbago with sciatica, left side: Secondary | ICD-10-CM | POA: Diagnosis not present

## 2017-12-20 DIAGNOSIS — M25512 Pain in left shoulder: Secondary | ICD-10-CM

## 2017-12-20 DIAGNOSIS — Z0000 Encounter for general adult medical examination without abnormal findings: Secondary | ICD-10-CM

## 2017-12-20 DIAGNOSIS — J45909 Unspecified asthma, uncomplicated: Secondary | ICD-10-CM | POA: Diagnosis not present

## 2017-12-20 DIAGNOSIS — R2232 Localized swelling, mass and lump, left upper limb: Secondary | ICD-10-CM

## 2017-12-20 NOTE — Progress Notes (Signed)
Patient ID: Mackenzie Weber, female   DOB: [birth year], 57 y.o.   MRN: [id number]   Location:  [facility]  Place of Service:  OFFICE  Provider: Arletha Grippe, DO  Patient Care Team: Gildardo Cranker, MD (Inactive) as PCP - General Rhoton, Shelda Altes., MD (Gastroenterology)  Extended Emergency Contact Information Primary Emergency Contact: Gotschall,Carl Address: [address], [place] Phone: [phone] Work Phone: [phone] Mobile Phone: [phone] Relation: Spouse  Code Status: Goals of Care: Advanced Directive information Advanced Directives 11/19/2017  Does Patient Have a Medical Advance Directive? No  Would patient like information on creating a medical advance directive? No - Patient declined     Chief Complaint  Patient presents with  . Medical Management of Chronic Issues    Pt is being seen for a physical. Pt reports no concerns today.     HPI: Patient is a 57 y.o. female seen in today for comprehensive exam. She is c/a left shoulder lump that is painful and is worse when wearing bra and lying on left side. She has FHx breast CA. She had a neg 3D breast mammogram in Feb 2019.  sciatica and LBP -  Back pain worse today. (+) left sciatic pain worse. No numbness but has tingling in left hand. She still has left knee pain and swelling. She saw Ortho and was told she did not have enough fluid in knee to drain. She was Rx topical antiinflammatory. She states left thigh cramp. No loss of bowel/bladder control. No falls. She takes meloxicam most days daily.  Obesity - she attempts to exercise on a regular basis but still has abdominal obesity with increasing abdominal girth. She is no longer taking belviq. She has gained 5 lbs since last OV. She completed a 21 day fast.  Edema -  She notes more swelling in LE due to recent prolonged driving in cramped vehicle. She has been taking diuretic daily.   HTN - BP controlled on hyzaar and  prn maxzide  DM -  A1c 6.8%; LDL 100; HDL 57  Depression screen [facility] 2/9 12/20/2017 10/22/2017 04/23/2017 04/20/2016 02/17/2016  Decreased Interest 0 0 0 0 0  Down, Depressed, Hopeless 0 0 0 0 0  PHQ - 2 Score 0 0 0 0 0    Fall Risk  12/20/2017 12/18/2017 10/22/2017 09/24/2017 08/28/2017  Falls in the past year? No No No No No   No flowsheet data found.   Health Maintenance  Topic Date Due  . HEMOGLOBIN A1C  1962[birth year]  . PNEUMOCOCCAL POLYSACCHARIDE VACCINE (1) 02/19/1964  . FOOT EXAM  02/19/1972  . OPHTHALMOLOGY EXAM  02/19/1972  . TETANUS/TDAP  02/18/1981  . INFLUENZA VACCINE  06/24/2018 (Originally 05/15/2017)  . Hepatitis C Screening  10/15/2018 (Originally [birth year])  . HIV Screening  10/15/2018 (Originally 02/18/1977)  . MAMMOGRAM  11/15/2018  . PAP SMEAR  11/16/2019  . COLONOSCOPY  07/29/2024    Past Medical History:  Diagnosis Date  . Anemia   . Asthma   . Foot pain, right   . Hypertension   . IBS (irritable bowel syndrome)   . Sciatica of left side   . Seasonal allergies     Past Surgical History:  Procedure Laterality Date  . ABDOMINAL HYSTERECTOMY    . APPENDECTOMY    . BLADDER SUSPENSION  2012   TVT  . BREAST SURGERY  2001   /biopsy-benign  . CHOLECYSTECTOMY    .  EXCISIONAL HEMORRHOIDECTOMY    . KNEE SURGERY     right  . Small Bowel Surgery    . TONSILLECTOMY    . TOTAL ABDOMINAL HYSTERECTOMY W/ BILATERAL SALPINGOOPHORECTOMY  1989   LSO-1987; [id number]  . WRIST SURGERY     carpal tunnel repair    Family History  Problem Relation Age of Onset  . Diabetes Father   . Hypertension Father   . Diabetes Mother   . Hypertension Mother   . Asthma Mother   . Diabetes Brother   . Hypertension Sister   . Diabetes Sister   . Cancer Sister 67       breast  . Hypertension Paternal Uncle    Family Status  Relation Name Status  . Father Lynann Bologna Deceased at age 60       Heart Attack  . Mother Carlyle Basques Deceased at age 70       Heart Attack  . Brother [employer]    . Sister [employer]  . Sister [employer]  . Sister Graylon Good  . Sister [employer]  . Brother [employer]  . Annamarie Major  (Not Specified)     Social History   Socioeconomic History  . Marital status: Married    Spouse name: Not on file  . Number of children: Not on file  . Years of education: Not on file  . Highest education level: Not on file  Social Needs  . Financial resource strain: Not on file  . Food insecurity - worry: Not on file  . Food insecurity - inability: Not on file  . Transportation needs - medical: Not on file  . Transportation needs - non-medical: Not on file  Occupational History  . Occupation: retired in 2015 from [employer].  Tobacco Use  . Smoking status: Never Smoker  . Smokeless tobacco: Never Used  Substance and Sexual Activity  . Alcohol use: No  . Drug use: No  . Sexual activity: Yes    Partners: Male    Birth control/protection: Surgical  Other Topics Concern  . Not on file  Social History Narrative  . Not on file    Allergies  Allergen Reactions  . Cortisone     Red area around injection site x 70mh  . Depo-Medrol [Methylprednisolone Acetate] Nausea Only and Other (See Comments)    Dizziness  . Aspirin Anxiety and Other (See Comments)    Rapid Heart beat    Allergies as of 12/20/2017      Reactions   Cortisone    Red area around injection site x 150m   Depo-medrol [methylprednisolone Acetate] Nausea Only, Other (See Comments)   Dizziness   Aspirin Anxiety, Other (See Comments)   Rapid Heart beat      Medication List        Accurate as of 12/20/17 12:57 PM. Always use your most recent med list.          cyclobenzaprine 10 MG tablet Commonly known as:  FLEXERIL Take 0.5-1 tablets (5-10 mg total) by mouth 2 (two) times daily as needed for muscle spasms.   DIGESTIVE ENZYME PO Take 2 (two) times daily by mouth.   EPINEPHrine 0.15 MG/0.3ML injection Commonly known as:  EPIPEN JR Inject 0.15 mg into the  muscle daily as needed for anaphylaxis.   esomeprazole 40 MG capsule Commonly known as:  NEXIUM Take 1 capsule (40 mg total) by mouth 2 (two) times daily before a meal.   estradiol  0.1 MG/GM vaginal cream Commonly known as:  ESTRACE Place 1.93 Applicatorfuls vaginally daily.   ipratropium 0.06 % nasal spray Commonly known as:  ATROVENT Place 2 sprays into both nostrils 4 (four) times daily.   losartan-hydrochlorothiazide 100-12.5 MG tablet Commonly known as:  HYZAAR TAKE 1 TABLET BY MOUTH EVERY DAY   meloxicam 7.5 MG tablet Commonly known as:  MOBIC Take 1 tablet (7.5 mg total) by mouth daily. May take up to 2 tabs po daily prn severe pain   metFORMIN 500 MG tablet Commonly known as:  GLUCOPHAGE Take 0.5 tablets (250 mg total) by mouth 2 (two) times daily with a meal.   multivitamin capsule Take 1 capsule by mouth daily.   PROAIR HFA 108 (90 Base) MCG/ACT inhaler Generic drug:  albuterol Inhale 2 puff into the lungs every 6 hours as needed for wheezing or shortness of breath   SYMBICORT 80-4.5 MCG/ACT inhaler Generic drug:  budesonide-formoterol INHALE 2 PUFFS FIRST THING IN THE MORNING AND THEN INHALE ANOTHER 2 PUFFS ABOUT 12 HOURS LATER   triamterene-hydrochlorothiazide 75-50 MG tablet Commonly known as:  MAXZIDE TAKE 1 TABLET BY MOUTH EVERY DAY        Review of Systems:  Review of Systems  Physical Exam: Vitals:   12/20/17 1251  BP: 124/74  Pulse: 79  Temp: 98.4 F (36.9 C)  TempSrc: Oral  SpO2: 99%  Weight: 229 lb (103.9 kg)  Height: 5' (1.524 m)   Body mass index is 44.72 kg/m. Physical Exam  Constitutional: She is oriented to person, place, and time. She appears well-developed and well-nourished. No distress.  HENT:  Head: Normocephalic and atraumatic.  Right Ear: External ear normal.  Left Ear: External ear normal.  Mouth/Throat: Oropharynx is clear and moist. No oropharyngeal exudate.  MMM; no oral thrush  Eyes: EOM are normal. Pupils are  equal, round, and reactive to light. No scleral icterus.  Neck: Normal range of motion. Neck supple. Carotid bruit is not present. No tracheal deviation present. No thyromegaly present.  Cardiovascular: Normal rate, regular rhythm and intact distal pulses. Exam reveals no gallop and no friction rub.  No murmur heard. No LE edema b/l. No calf TTP  Pulmonary/Chest: Effort normal and breath sounds normal. No respiratory distress. She has no wheezes. She has no rales. She exhibits no tenderness. Right breast exhibits no inverted nipple, no mass, no nipple discharge, no skin change and no tenderness. Left breast exhibits no inverted nipple, no mass, no nipple discharge, no skin change and no tenderness. Breasts are symmetrical.  No rhonchi  Abdominal: Soft. Bowel sounds are normal. She exhibits no distension and no mass. There is no hepatosplenomegaly or hepatomegaly. There is no tenderness. There is no rebound and no guarding. No hernia.  Musculoskeletal: She exhibits edema. She exhibits no deformity.  Lymphadenopathy:    She has no cervical adenopathy.  Neurological: She is alert and oriented to person, place, and time. She has normal reflexes.  Skin: Skin is warm and dry. No rash noted.     Psychiatric: She has a normal mood and affect. Her behavior is normal. Judgment and thought content normal.  Vitals reviewed.  Diabetic Foot Exam - Simple   Simple Foot Form Diabetic Foot exam was performed with the following findings:  Yes 12/20/2017  1:48 PM  Visual Inspection No deformities, no ulcerations, no other skin breakdown bilaterally:  Yes Sensation Testing Intact to touch and monofilament testing bilaterally:  Yes Pulse Check Posterior Tibialis and Dorsalis pulse intact bilaterally:  Yes Comments      Labs reviewed:  Basic Metabolic Panel: Recent Labs    04/23/17 1441  NA 138  K 4.0  CL 102  CO2 26  GLUCOSE 102*  BUN 14  CREATININE 0.85  CALCIUM 9.9  MG 2.2   Liver Function  Tests: Recent Labs    04/23/17 1441  AST 14  ALT 10  ALKPHOS 73  BILITOT 0.2  PROT 7.1  ALBUMIN 4.1   No results for input(s): LIPASE, AMYLASE in the last 8760 hours. No results for input(s): AMMONIA in the last 8760 hours. CBC: No results for input(s): WBC, NEUTROABS, HGB, HCT, MCV, PLT in the last 8760 hours. Lipid Panel: No results for input(s): CHOL, HDL, LDLCALC, TRIG, CHOLHDL, LDLDIRECT in the last 8760 hours. No results found for: HGBA1C  Procedures: No results found.  Assessment/Plan   ICD-10-CM   1. Well adult exam Z00.00   2. Lump of skin of left upper extremity R22.32 CT SHOULDER LEFT W WO CONTRAST  3. Left shoulder pain, unspecified chronicity M25.512 CT SHOULDER LEFT W WO CONTRAST  4. Controlled type 2 diabetes mellitus without complication, without long-term current use of insulin (HCC) E11.9 BMP with eGFR([facility])    ALT    Lipid Panel    Microalbumin/Creatinine Ratio, Urine    Urinalysis with Reflex Microscopic    Hemoglobin A1c  5. Asthma, chronic, unspecified asthma severity, uncomplicated B52.481   6. Chronic bilateral low back pain with left-sided sciatica M54.42    G89.29     Will call with imaging appt  Continue current medications as ordered  Follow up as scheduled in April 2019. Fasting labs prior to appt.   Keeping You Healthy handout given  Will need ECG at next [place]. Perlie Gold  [facility] and [facility] [address] [place], [place] [address] [phone] Cell (Monday-Friday 8 AM - 5 PM) [phone] After 5 PM and follow prompts

## 2017-12-20 NOTE — Patient Instructions (Signed)
Will call with imaging appt  Continue current medications as ordered  Follow up as scheduled in April 2019. Fasting labs prior to appt.   Keeping You Healthy  Get These Tests  Blood Pressure- Have your blood pressure checked by your healthcare provider at least once a year.  Normal blood pressure is 120/80.  Weight- Have your body mass index (BMI) calculated to screen for obesity.  BMI is a measure of body fat based on height and weight.  You can calculate your own BMI at [employer]  Cholesterol- Have your cholesterol checked every year.  Diabetes- Have your blood sugar checked every year if you have high blood pressure, high cholesterol, a family history of diabetes or if you are overweight.  Pap Test - Have a pap test every 1 to 5 years if you have been sexually active.  If you are older than 65 and recent pap tests have been normal you may not need additional pap tests.  In addition, if you have had a hysterectomy  for benign disease additional pap tests are not necessary.  Mammogram-Yearly mammograms are essential for early detection of breast cancer  Screening for Colon Cancer- Colonoscopy starting at age 3. Screening may begin sooner depending on your family history and other health conditions.  Follow up colonoscopy as directed by your Gastroenterologist.  Screening for Osteoporosis- Screening begins at age 49 with bone density scanning, sooner if you are at higher risk for developing Osteoporosis.  Get these medicines  Calcium with Vitamin D- Your body requires 1200-1500 mg of Calcium a day and [phone] IU of Vitamin D a day.  You can only absorb 500 mg of Calcium at a time therefore Calcium must be taken in 2 or 3 separate doses throughout the day.  Hormones- Hormone therapy has been associated with increased risk for certain cancers and heart disease.  Talk to your healthcare provider about if you need relief from menopausal symptoms.  Aspirin- Ask your  healthcare provider about taking Aspirin to prevent Heart Disease and Stroke.  Get these Immuniztions  Flu shot- Every fall  Pneumonia shot- Once after the age of 34; if you are younger ask your healthcare provider if you need a pneumonia shot.  Tetanus- Every ten years.  Zostavax- Once after the age of 3 to prevent shingles.  Take these steps  Don't smoke- Your healthcare provider can help you quit. For tips on how to quit, ask your healthcare provider or go to www.smokefree.gov or call 1-800 QUIT-NOW.  Be physically active- Exercise 5 days a week for a minimum of 30 minutes.  If you are not already physically active, start slow and gradually work up to 30 minutes of moderate physical activity.  Try walking, dancing, bike riding, swimming, etc.  Eat a healthy diet- Eat a variety of healthy foods such as fruits, vegetables, whole grains, low fat milk, low fat cheeses, yogurt, lean meats, chicken, fish, eggs, dried beans, tofu, etc.  For more information go to www.thenutritionsource.org  Dental visit- Brush and floss teeth twice daily; visit your dentist twice a year.  Eye exam- Visit your Optometrist or Ophthalmologist yearly.  Drink alcohol in moderation- Limit alcohol intake to one drink or less a day.  Never drink and drive.  Depression- Your emotional health is as important as your physical health.  If you're feeling down or losing interest in things you normally enjoy, please talk to your healthcare provider.  Seat Belts- can save your life; always wear one  Smoke/Carbon  Monoxide detectors- These detectors need to be installed on the appropriate level of your home.  Replace batteries at least once a year.  Violence- If anyone is threatening or hurting you, please tell your healthcare provider.  Living Will/ Health care power of attorney- Discuss with your healthcare provider and family.

## 2017-12-24 ENCOUNTER — Telehealth: Payer: Self-pay

## 2017-12-24 DIAGNOSIS — R2232 Localized swelling, mass and lump, left upper limb: Secondary | ICD-10-CM

## 2017-12-24 DIAGNOSIS — M25512 Pain in left shoulder: Secondary | ICD-10-CM

## 2017-12-24 NOTE — Telephone Encounter (Signed)
Order placed and faxed to [facility] @ [phone] along with insurance card. Patient aware she can walk in M-F 8 am- 5 pm

## 2017-12-24 NOTE — Telephone Encounter (Signed)
Get xray left shoulder re: pain and lump

## 2017-12-24 NOTE — Telephone Encounter (Signed)
Amy called patients insurance company [facility]) to get approval of CT-left shoulder. I picked up on call to answer clinical questions.  Per clinical review nurse, CT denied. Patient needs to have standard imaging such as xray, ultrasound, or previous CT/MRI results prior to a CT.   If Dr.Carter would like to do a peer-to-peer please call [phone] by 4:00 pm today

## 2017-12-27 ENCOUNTER — Telehealth: Payer: Self-pay

## 2017-12-27 DIAGNOSIS — M25512 Pain in left shoulder: Secondary | ICD-10-CM

## 2017-12-27 DIAGNOSIS — R2232 Localized swelling, mass and lump, left upper limb: Secondary | ICD-10-CM

## 2017-12-27 NOTE — Telephone Encounter (Signed)
Per Dr.Carter, no mass on xray of shoulder. Patient to have a MRI left shoulder without GAD re: pain and palpable mass.  I called patient and informed her of results. Patient verbalized understanding.  Order pending, please complete (routed back to [place])

## 2018-01-01 ENCOUNTER — Encounter: Payer: Self-pay | Admitting: Internal Medicine

## 2018-01-02 ENCOUNTER — Telehealth: Payer: Self-pay | Admitting: *Deleted

## 2018-01-02 NOTE — Telephone Encounter (Signed)
Received MRI Left Shoulder without Contrast from [facility]   Impression:Negative for mass or fluid collection.  Rotator cuff tendinopathy without tear appears worst in the supraspinatus and infraspinatus.  Mild to Moderate acromioclavicular osteoarthritis. Small volume of fluid in the subacromial/subdeltoid bursa compatible with bursitis.   Placed results in Dr. Vale Haven folder to review.

## 2018-01-07 NOTE — Telephone Encounter (Signed)
MRI reveals arthritis at [facility] joint, bursitis, tendon enlargement in rotator cuff muscles but no tear. No other process seen

## 2018-01-07 NOTE — Telephone Encounter (Signed)
Patient is calling requesting results from her MRI she had done. Please Advise.

## 2018-01-08 NOTE — Telephone Encounter (Signed)
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 Patient stated that she is very confused. Stated it is negative for mass but she has a tumor on her arm. Please advise.

## 2018-01-08 NOTE — Telephone Encounter (Signed)
Patient stated she would like for you to place a referral to a surgeon.

## 2018-01-08 NOTE — Telephone Encounter (Signed)
MRI did not pick up a mass. Probably fatty tissue. If she remains concerned recommend referral to general sx

## 2018-01-09 ENCOUNTER — Other Ambulatory Visit: Payer: Self-pay | Admitting: Internal Medicine

## 2018-01-09 DIAGNOSIS — R2232 Localized swelling, mass and lump, left upper limb: Secondary | ICD-10-CM

## 2018-01-13 ENCOUNTER — Encounter: Payer: Self-pay | Admitting: Nurse Practitioner

## 2018-01-13 ENCOUNTER — Ambulatory Visit (INDEPENDENT_AMBULATORY_CARE_PROVIDER_SITE_OTHER): Payer: BC Managed Care – PPO | Admitting: Nurse Practitioner

## 2018-01-13 VITALS — BP 122/72 | HR 76 | Temp 98.1°F | Ht 60.0 in | Wt 221.0 lb

## 2018-01-13 DIAGNOSIS — S29012A Strain of muscle and tendon of back wall of thorax, initial encounter: Secondary | ICD-10-CM

## 2018-01-13 DIAGNOSIS — J302 Other seasonal allergic rhinitis: Secondary | ICD-10-CM

## 2018-01-13 NOTE — Progress Notes (Signed)
Careteam: Patient Care Team: Gildardo Cranker, MD (Inactive) as PCP - General Rhoton, Shelda Altes., MD (Gastroenterology)  Advanced Directive information    Allergies  Allergen Reactions  . Cortisone     Red area around injection site x 51mth  . Depo-Medrol [Methylprednisolone Acetate] Nausea Only and Other (See Comments)    Dizziness  . Aspirin Anxiety and Other (See Comments)    Rapid Heart beat    Chief Complaint  Patient presents with  . Acute Visit    Pt is being seen due to pain at right shoulder blade/upper back that can be felt under right breast at times. Pt also has sinus congestion and drainage with cough.      HPI: Patient is a 57 y.o. female seen in the office today due to allergies and back pain.  1) reports drainage, low voice and cough which is making back pain worse. Post nasal drip. Reports worsening of symptoms for the past 3- 4 days. Started off with sneezing. Taking Claritin 10 mg daily since Friday. Burning/runny nose.  Has also been doing nasal spray and inhaler- using Symbicort and albuterol as needed  Nothing has helped symptoms.   2) reached behind seat to get something in her car- otherwise no injury or time that she would have caused pain but reports pain in right shoulder blade that started 3 days that she noticed when she started to do something. Also notes she has been sleeping differently due to lipoma on right shoulder.  Pain been about the same since she noticed the pain. Describes pain as sharp 7/10, worse with movement of right arm. Worse with cough or belching.  Has been using cream- voltaren gel, medicated patch, hot shower and got some patches for her tens unit but has not used yet.  Has used flexeril but has not help.  Has used meloxicam once but did not take again because it did not help.   Review of Systems:  Review of Systems  Constitutional: Negative for chills, fever and malaise/fatigue.  HENT: Positive for congestion. Negative for  ear discharge, ear pain, nosebleeds, sinus pain and sore throat.   Respiratory: Negative for shortness of breath.   Cardiovascular: Negative for chest pain.  Musculoskeletal: Positive for back pain and myalgias. Negative for falls and joint pain.  Endo/Heme/Allergies: Positive for environmental allergies.    Past Medical History:  Diagnosis Date  . Anemia   . Asthma   . Foot pain, right   . Hypertension   . IBS (irritable bowel syndrome)   . Sciatica of left side   . Seasonal allergies    Past Surgical History:  Procedure Laterality Date  . ABDOMINAL HYSTERECTOMY    . APPENDECTOMY    . BLADDER SUSPENSION  2012   TVT  . BREAST SURGERY  2001   /biopsy-benign  . CHOLECYSTECTOMY    . EXCISIONAL HEMORRHOIDECTOMY    . KNEE SURGERY     right  . Small Bowel Surgery    . TONSILLECTOMY    . TOTAL ABDOMINAL HYSTERECTOMY W/ BILATERAL SALPINGOOPHORECTOMY  1989   LSO-1987; [id number]  . WRIST SURGERY     carpal tunnel repair   Social History:   reports that she has never smoked. She has never used smokeless tobacco. She reports that she does not drink alcohol or use drugs.  Family History  Problem Relation Age of Onset  . Diabetes Father   . Hypertension Father   . Diabetes Mother   . Hypertension Mother   .  Asthma Mother   . Diabetes Brother   . Hypertension Sister   . Diabetes Sister   . Cancer Sister 58       breast  . Hypertension Paternal Uncle     Medications: Patient's Medications  New Prescriptions   No medications on file  Previous Medications   CYCLOBENZAPRINE (FLEXERIL) 10 MG TABLET    Take 0.5-1 tablets (5-10 mg total) by mouth 2 (two) times daily as needed for muscle spasms.   DIGESTIVE ENZYMES (DIGESTIVE ENZYME PO)    Take 2 (two) times daily by mouth.   EPINEPHRINE (EPIPEN JR) 0.15 MG/0.3ML INJECTION    Inject 0.15 mg into the muscle daily as needed for anaphylaxis.    ESOMEPRAZOLE (NEXIUM) 40 MG CAPSULE    Take 1 capsule (40 mg total) by mouth 2 (two)  times daily before a meal.   ESTRADIOL (ESTRACE) 0.1 MG/GM VAGINAL CREAM    Place 9.37 Applicatorfuls vaginally daily.   IPRATROPIUM (ATROVENT) 0.06 % NASAL SPRAY    Place 2 sprays into both nostrils 4 (four) times daily.   LOSARTAN-HYDROCHLOROTHIAZIDE (HYZAAR) 100-12.5 MG TABLET    TAKE 1 TABLET BY MOUTH EVERY DAY   MELOXICAM (MOBIC) 7.5 MG TABLET    Take 1 tablet (7.5 mg total) by mouth daily. May take up to 2 tabs po daily prn severe pain   METFORMIN (GLUCOPHAGE) 500 MG TABLET    Take 0.5 tablets (250 mg total) by mouth 2 (two) times daily with a meal.   MULTIPLE VITAMIN (MULTIVITAMIN) CAPSULE    Take 1 capsule by mouth daily.   PROAIR HFA 108 (90 BASE) MCG/ACT INHALER    Inhale 2 puff into the lungs every 6 hours as needed for wheezing or shortness of breath   SYMBICORT 80-4.5 MCG/ACT INHALER    INHALE 2 PUFFS FIRST THING IN THE MORNING AND THEN INHALE ANOTHER 2 PUFFS ABOUT 12 HOURS LATER   TRIAMTERENE-HYDROCHLOROTHIAZIDE (MAXZIDE) 75-50 MG TABLET    TAKE 1 TABLET BY MOUTH EVERY DAY  Modified Medications   No medications on file  Discontinued Medications   No medications on file     Physical Exam:  Vitals:   01/13/18 1525  BP: 122/72  Pulse: 76  Temp: 98.1 F (36.7 C)  TempSrc: Oral  SpO2: 99%  Weight: 221 lb (100.2 kg)  Height: 5' (1.524 m)   Body mass index is 43.16 kg/m.  Physical Exam  Constitutional: She is oriented to person, place, and time. She appears well-developed and well-nourished.  HENT:  Head: Normocephalic and atraumatic.  Right Ear: External ear normal.  Left Ear: External ear normal.  Nose: Mucosal edema and rhinorrhea present.  Mouth/Throat: Oropharynx is clear and moist. No oropharyngeal exudate.  Cardiovascular: Normal rate, regular rhythm and normal heart sounds.  Pulmonary/Chest: Effort normal and breath sounds normal.  Musculoskeletal:       Back:  Neurological: She is alert and oriented to person, place, and time.  Skin: Skin is warm and  dry.    Labs reviewed: Basic Metabolic Panel: Recent Labs    04/23/17 1441  NA 138  K 4.0  CL 102  CO2 26  GLUCOSE 102*  BUN 14  CREATININE 0.85  CALCIUM 9.9  MG 2.2   Liver Function Tests: Recent Labs    04/23/17 1441  AST 14  ALT 10  ALKPHOS 73  BILITOT 0.2  PROT 7.1  ALBUMIN 4.1   No results for input(s): LIPASE, AMYLASE in the last 8760 hours. No results for  input(s): AMMONIA in the last 8760 hours. CBC: No results for input(s): WBC, NEUTROABS, HGB, HCT, MCV, PLT in the last 8760 hours. Lipid Panel: No results for input(s): CHOL, HDL, LDLCALC, TRIG, CHOLHDL, LDLDIRECT in the last 8760 hours. TSH: No results for input(s): TSH in the last 8760 hours. A1C: No results found for: HGBA1C   Assessment/Plan .1. Muscle strain of right upper back, initial encounter Pt with recent lipoma to left shoulder which causes pain to sleep on that side, now having to sleep differently and noted pain to right upper back, question if this is cause as she does not have known injury or strain other than reaching behind car seat.  To start meloxicam 15 mg by mouth daily for 3 days then decrease to 7.5 mg daily for 1 week To use heating pad ~20 mins 2-3 times daily x 7 days  To apply muscle rub after heat To use flexeril as needed, may use at night if makes sleepy during the day.   2. Seasonal allergies To use noral AD by mouth as needed for allergies- this is behind the counter  To continue to use nasal sprays  Can also add plain saline as needed throughout the day.   Next appt: as scheduled.  Carlos American. [place], [facility] [phone]

## 2018-01-13 NOTE — Patient Instructions (Addendum)
To start meloxicam 15 mg by mouth daily for 3 days then decrease to 7.5 mg daily for 1 week To use heating pad ~20 mins 2-3 times daily x 7 days  To apply muscle rub after heat To use flexeril as needed, may use at night if makes sleepy during the day.   To use noral AD by mouth as needed for allergies- this is behind the counter  To continue to use nasal sprays  Can also add plain saline as needed throughout the day.   Allergic Rhinitis, Adult Allergic rhinitis is an allergic reaction that affects the mucous membrane inside the nose. It causes sneezing, a runny or stuffy nose, and the feeling of mucus going down the back of the throat (postnasal drip). Allergic rhinitis can be mild to severe. There are two types of allergic rhinitis:  Seasonal. This type is also called hay fever. It happens only during certain seasons.  Perennial. This type can happen at any time of the year.  What are the causes? This condition happens when the body's defense system (immune system) responds to certain harmless substances called allergens as though they were germs.  Seasonal allergic rhinitis is triggered by pollen, which can come from grasses, trees, and weeds. Perennial allergic rhinitis may be caused by:  House dust mites.  Pet dander.  Mold spores.  What are the signs or symptoms? Symptoms of this condition include:  Sneezing.  Runny or stuffy nose (nasal congestion).  Postnasal drip.  Itchy nose.  Tearing of the eyes.  Trouble sleeping.  Daytime sleepiness.  How is this diagnosed? This condition may be diagnosed based on:  Your medical history.  A physical exam.  Tests to check for related conditions, such as: ? Asthma. ? Pink eye. ? Ear infection. ? Upper respiratory infection.  Tests to find out which allergens trigger your symptoms. These may include skin or blood tests.  How is this treated? There is no cure for this condition, but treatment can help control  symptoms. Treatment may include:  Taking medicines that block allergy symptoms, such as antihistamines. Medicine may be given as a shot, nasal spray, or pill.  Avoiding the allergen.  Desensitization. This treatment involves getting ongoing shots until your body becomes less sensitive to the allergen. This treatment may be done if other treatments do not help.  If taking medicine and avoiding the allergen does not work, new, stronger medicines may be prescribed.  Follow these instructions at home:  Find out what you are allergic to. Common allergens include smoke, dust, and pollen.  Avoid the things you are allergic to. These are some things you can do to help avoid allergens: ? Replace carpet with wood, tile, or vinyl flooring. Carpet can trap dander and dust. ? Do not smoke. Do not allow smoking in your home. ? Change your heating and air conditioning filter at least once a month. ? During allergy season:  Keep windows closed as much as possible.  Plan outdoor activities when pollen counts are lowest. This is usually during the evening hours.  When coming indoors, change clothing and shower before sitting on furniture or bedding.  Take over-the-counter and prescription medicines only as told by your health care provider.  Keep all follow-up visits as told by your health care provider. This is important. Contact a health care provider if:  You have a fever.  You develop a persistent cough.  You make whistling sounds when you breathe (you wheeze).  Your symptoms interfere  with your normal daily activities. Get help right away if:  You have shortness of breath. Summary  This condition can be managed by taking medicines as directed and avoiding allergens.  Contact your health care provider if you develop a persistent cough or fever.  During allergy season, keep windows closed as much as possible. This information is not intended to replace advice given to you by your  health care provider. Make sure you discuss any questions you have with your health care provider. Document Released: 06/26/2001 Document Revised: 11/08/2016 Document Reviewed: 11/08/2016 Elsevier Interactive Patient Education  [employer].

## 2018-01-22 ENCOUNTER — Other Ambulatory Visit: Payer: BC Managed Care – PPO

## 2018-01-22 ENCOUNTER — Other Ambulatory Visit: Payer: Self-pay

## 2018-01-22 DIAGNOSIS — E119 Type 2 diabetes mellitus without complications: Secondary | ICD-10-CM

## 2018-01-23 LAB — LIPID PANEL
Cholesterol: 159 mg/dL (ref <200)
HDL: 56 mg/dL (ref >50)
LDL CHOLESTEROL (CALC): 90 mg/dL
Non-HDL Cholesterol (Calc): 103 mg/dL (calc) (ref <130)
TRIGLYCERIDES: 45 mg/dL (ref <150)
Total CHOL/HDL Ratio: 2.8 (calc) (ref <5.0)

## 2018-01-23 LAB — URINALYSIS, ROUTINE W REFLEX MICROSCOPIC
Bilirubin Urine: NEGATIVE
GLUCOSE, UA: NEGATIVE
Hgb urine dipstick: NEGATIVE
KETONES UR: NEGATIVE
Leukocytes, UA: NEGATIVE
NITRITE: NEGATIVE
Protein, ur: NEGATIVE
SPECIFIC GRAVITY, URINE: 1.026 (ref 1.001–1.03)

## 2018-01-23 LAB — MICROALBUMIN / CREATININE URINE RATIO
Creatinine, Urine: 200 mg/dL (ref 20–275)
MICROALB UR: 0.9 mg/dL
Microalb Creat Ratio: 5 mcg/mg creat (ref <30)

## 2018-01-23 LAB — BASIC METABOLIC PANEL WITH GFR
BUN: 14 mg/dL (ref 7–25)
CALCIUM: 9.6 mg/dL (ref 8.6–10.4)
CO2: 30 mmol/L (ref 20–32)
Chloride: 104 mmol/L (ref 98–110)
Creat: 0.82 mg/dL (ref 0.50–1.05)
GFR, EST NON AFRICAN AMERICAN: 81 mL/min/{1.73_m2} (ref >=60)
GFR, Est African American: 93 mL/min/{1.73_m2} (ref >=60)
Glucose, Bld: 99 mg/dL (ref 65–99)
Potassium: 4 mmol/L (ref 3.5–5.3)
Sodium: 140 mmol/L (ref 135–146)

## 2018-01-23 LAB — HEMOGLOBIN A1C
Hgb A1c MFr Bld: 6.5 % of total Hgb — ABNORMAL HIGH (ref <5.7)
MEAN PLASMA GLUCOSE: 140 (calc)
eAG (mmol/L): 7.7 (calc)

## 2018-01-23 LAB — ALT: ALT: 11 U/L (ref 6–29)

## 2018-01-28 ENCOUNTER — Encounter: Payer: Self-pay | Admitting: Internal Medicine

## 2018-01-28 ENCOUNTER — Ambulatory Visit (INDEPENDENT_AMBULATORY_CARE_PROVIDER_SITE_OTHER): Payer: BC Managed Care – PPO | Admitting: Internal Medicine

## 2018-01-28 VITALS — BP 110/60 | HR 77 | Temp 98.0°F | Ht 60.0 in | Wt 223.0 lb

## 2018-01-28 DIAGNOSIS — I10 Essential (primary) hypertension: Secondary | ICD-10-CM

## 2018-01-28 DIAGNOSIS — M9906 Segmental and somatic dysfunction of lower extremity: Secondary | ICD-10-CM | POA: Diagnosis not present

## 2018-01-28 DIAGNOSIS — D1722 Benign lipomatous neoplasm of skin and subcutaneous tissue of left arm: Secondary | ICD-10-CM

## 2018-01-28 DIAGNOSIS — M9903 Segmental and somatic dysfunction of lumbar region: Secondary | ICD-10-CM

## 2018-01-28 DIAGNOSIS — S8012XA Contusion of left lower leg, initial encounter: Secondary | ICD-10-CM

## 2018-01-28 DIAGNOSIS — M9904 Segmental and somatic dysfunction of sacral region: Secondary | ICD-10-CM

## 2018-01-28 DIAGNOSIS — M62838 Other muscle spasm: Secondary | ICD-10-CM | POA: Diagnosis not present

## 2018-01-28 DIAGNOSIS — M5442 Lumbago with sciatica, left side: Secondary | ICD-10-CM | POA: Diagnosis not present

## 2018-01-28 DIAGNOSIS — M9905 Segmental and somatic dysfunction of pelvic region: Secondary | ICD-10-CM | POA: Diagnosis not present

## 2018-01-28 DIAGNOSIS — G8929 Other chronic pain: Secondary | ICD-10-CM

## 2018-01-28 NOTE — Progress Notes (Signed)
Patient ID: Mackenzie Weber, female   DOB: Jun 25, 1961, 57 y.o.   MRN: [id number]   Location:  [facility] OFFICE  Provider: DR Arletha Grippe  Code Status:  Goals of Care:  Advanced Directives 11/19/2017  Does Patient Have a Medical Advance Directive? No  Would patient like information on creating a medical advance directive? No - Patient declined     Chief Complaint  Patient presents with  . Medical Management of Chronic Issues    OMM; Pt c/o (L) left sheen hurt with knot on it; EKG (not completed at pervious CPX visit)   . Medication Refill    No Refills  . Advance Care Planning    Not on file    HPI: Patient is a 57 y.o. female seen today for chronic pain/OMM. She hit her left leg against bar on shopping cart last week. She now has a painful lump on anterior leg. She plans to have left shoulder lipoma removed in early May by Dr Brantley Stage. MRI did not reveal any rotator cuff injury or other occult process. 5/10 pain on scale in general. No numbness or tingling. No loss of bowel/bladder control.  sciatica and LBP -  Back pain stable. (+) left sciatic pain. She still has left knee pain and swelling. She has seen Ortho in the past for her knee. She uses voltaren gel. She takes meloxicam most days daily.  Obesity - she attends bariatric clinic at [facility]. Current BMI 43.55. Weight down 6 lbs since Mar 2019 ov. She stopped metformin as she did not see benefit  Edema -  Worse after attending theater performance last week. She has been taking diuretic daily.   HTN - BP stable on hyzaar and prn maxzide  DM -  currently diet controlled. She stopped taking her metformin. A1c 6.5%; LDL 90; HDL 56; urine microalbumin/Cr ratio 5. She is on ARB    Past Medical History:  Diagnosis Date  . Anemia   . Asthma   . Foot pain, right   . Hypertension   . IBS (irritable bowel syndrome)   . Sciatica of left side   . Seasonal allergies     Past Surgical History:  Procedure Laterality Date  . ABDOMINAL  HYSTERECTOMY    . APPENDECTOMY    . BLADDER SUSPENSION  2012   TVT  . BREAST SURGERY  2001   /biopsy-benign  . CHOLECYSTECTOMY    . EXCISIONAL HEMORRHOIDECTOMY    . KNEE SURGERY     right  . Small Bowel Surgery    . TONSILLECTOMY    . TOTAL ABDOMINAL HYSTERECTOMY W/ BILATERAL SALPINGOOPHORECTOMY  1989   LSO-1987; [id number]  . WRIST SURGERY     carpal tunnel repair     reports that she has never smoked. She has never used smokeless tobacco. She reports that she does not drink alcohol or use drugs. Social History   Socioeconomic History  . Marital status: Married    Spouse name: Not on file  . Number of children: Not on file  . Years of education: Not on file  . Highest education level: Not on file  Occupational History  . Occupation: retired in 2015 from [employer].  Social Needs  . Financial resource strain: Not on file  . Food insecurity:    Worry: Not on file    Inability: Not on file  . Transportation needs:    Medical: Not on file    Non-medical: Not on file  Tobacco Use  . Smoking status: Never Smoker  . Smokeless tobacco: Never Used  Substance and Sexual Activity  . Alcohol use: No  . Drug use: No  . Sexual activity: Yes    Partners: Male    Birth control/protection: Surgical  Lifestyle  . Physical activity:    Days per week: Not on file    Minutes per session: Not on file  . Stress: Not on file  Relationships  . Social connections:    Talks on phone: Not on file    Gets together: Not on file    Attends religious service: Not on file    Active member of club or organization: Not on file    Attends meetings of clubs or organizations: Not on file    Relationship status: Not on file  . Intimate partner violence:    Fear of current or ex partner: Not on file    Emotionally abused: Not on file    Physically abused: Not on file    Forced sexual activity: Not on file  Other Topics Concern  . Not on file  Social History Narrative  . Not on file     Family History  Problem Relation Age of Onset  . Diabetes Father   . Hypertension Father   . Diabetes Mother   . Hypertension Mother   . Asthma Mother   . Diabetes Brother   . Hypertension Sister   . Diabetes Sister   . Cancer Sister 63       breast  . Hypertension Paternal Uncle     Allergies  Allergen Reactions  . Cortisone     Red area around injection site x 27mth  . Depo-Medrol [Methylprednisolone Acetate] Nausea Only and Other (See Comments)    Dizziness  . Aspirin Anxiety and Other (See Comments)    Rapid Heart beat    Outpatient Encounter Medications as of 01/28/2018  Medication Sig  . cyclobenzaprine (FLEXERIL) 10 MG tablet Take 0.5-1 tablets (5-10 mg total) by mouth 2 (two) times daily as needed for muscle spasms.  . Digestive Enzymes (DIGESTIVE ENZYME PO) Take 2 (two) times daily by mouth.  . EPINEPHrine (EPIPEN JR) 0.15 MG/0.3ML injection Inject 0.15 mg into the muscle daily as needed for anaphylaxis.   Marland Kitchen esomeprazole (NEXIUM) 40 MG capsule Take 1 capsule (40 mg total) by mouth 2 (two) times daily before a meal.  . estradiol (ESTRACE) 0.1 MG/GM vaginal cream Place 2.70 Applicatorfuls vaginally daily.  Marland Kitchen ipratropium (ATROVENT) 0.06 % nasal spray Place 2 sprays into both nostrils 4 (four) times daily.  Marland Kitchen losartan-hydrochlorothiazide (HYZAAR) 100-12.5 MG tablet TAKE 1 TABLET BY MOUTH EVERY DAY  . meloxicam (MOBIC) 7.5 MG tablet Take 1 tablet (7.5 mg total) by mouth daily. May take up to 2 tabs po daily prn severe pain  . metFORMIN (GLUCOPHAGE) 500 MG tablet Take 0.5 tablets (250 mg total) by mouth 2 (two) times daily with a meal. (Patient taking differently: Take 250 mg by mouth daily. )  . Multiple Vitamin (MULTIVITAMIN) capsule Take 1 capsule by mouth daily.  Marland Kitchen PROAIR HFA 108 (90 Base) MCG/ACT inhaler Inhale 2 puff into the lungs every 6 hours as needed for wheezing or shortness of breath  . SYMBICORT 80-4.5 MCG/ACT inhaler INHALE 2 PUFFS FIRST THING IN THE  MORNING AND THEN INHALE ANOTHER 2 PUFFS ABOUT 12 HOURS LATER  . triamterene-hydrochlorothiazide (MAXZIDE) 75-50 MG tablet TAKE 1 TABLET BY MOUTH EVERY DAY   No facility-administered encounter medications on file  as of 01/28/2018.     Review of Systems:  Review of Systems  Musculoskeletal: Positive for arthralgias, back pain and joint swelling.  Skin:       Left shoulder lump  All other systems reviewed and are negative.   Health Maintenance  Topic Date Due  . PNEUMOCOCCAL POLYSACCHARIDE VACCINE (1) 02/19/1964  . OPHTHALMOLOGY EXAM  02/19/1972  . TETANUS/TDAP  02/18/1981  . INFLUENZA VACCINE  06/24/2018 (Originally 05/15/2018)  . Hepatitis C Screening  10/15/2018 (Originally 1960-11-07)  . HIV Screening  10/15/2018 (Originally 02/18/1977)  . HEMOGLOBIN A1C  07/24/2018  . MAMMOGRAM  11/15/2018  . FOOT EXAM  12/21/2018  . PAP SMEAR  11/16/2019  . COLONOSCOPY  07/29/2024    Physical Exam: Vitals:   01/28/18 1255  BP: 110/60  Pulse: 77  Temp: 98 F (36.7 C)  TempSrc: Oral  SpO2: 98%  Weight: 223 lb (101.2 kg)  Height: 5' (1.524 m)   Body mass index is 43.55 kg/m. Physical Exam  Constitutional: She is oriented to person, place, and time. She appears well-developed and well-nourished.    Cardiovascular:  +1 pitting LE edema b/l. No calf TTP. Left 10 cent sized TTP contusion on distal anterior leg  Musculoskeletal: She exhibits edema and tenderness.  (+) left standing flexion test; no short leg; L>R SI joint restriction with ASIS TTP; left pelvic in flare; sacral torsion and reduced ROM; paravertebral lumbar, thoracic and cervical muscle hypertrophy with ropy tissue texture changes; strength intact; b/l LE distal swelling  Neurological: She is alert and oriented to person, place, and time.  Skin: Skin is warm and dry. No rash noted.     Psychiatric: She has a normal mood and affect. Her behavior is normal. Judgment and thought content normal.    Labs reviewed: Basic  Metabolic Panel: Recent Labs    04/23/17 1441 01/22/18 0823  NA 138 140  K 4.0 4.0  CL 102 104  CO2 26 30  GLUCOSE 102* 99  BUN 14 14  CREATININE 0.85 0.82  CALCIUM 9.9 9.6  MG 2.2  --    Liver Function Tests: Recent Labs    04/23/17 1441 01/22/18 0823  AST 14  --   ALT 10 11  ALKPHOS 73  --   BILITOT 0.2  --   PROT 7.1  --   ALBUMIN 4.1  --    No results for input(s): LIPASE, AMYLASE in the last 8760 hours. No results for input(s): AMMONIA in the last 8760 hours. CBC: No results for input(s): WBC, NEUTROABS, HGB, HCT, MCV, PLT in the last 8760 hours. Lipid Panel: Recent Labs    01/22/18 0823  CHOL 159  HDL 56  LDLCALC 90  TRIG 45  CHOLHDL 2.8   Lab Results  Component Value Date   HGBA1C 6.5 (H) 01/22/2018    Procedures since last visit: No results found. ECG OBTAINED AND REVIEWED BY MYSELF: NSR @ 72 bpm, nml axis, NSST changes. No change since 03/2016  Assessment/Plan   ICD-10-CM   1. Chronic left-sided low back pain with left-sided sciatica M54.42    G89.29   2. 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 Muscle spasm of left lower extremity M62.838   4. Essential hypertension I10 EKG 12-Lead  5. Somatic dysfunction of lower extremity M99.06   6. Somatic dysfunction of pelvic region M99.05   7. Somatic dysfunction of lumbar region M99.03   8. Somatic dysfunction of sacral region M99.04   9. Contusion of left  lower leg, initial encounter S80.12XA    PROCEDURE NOTE:  After verbal consent obtained, OMT utilized with improvement in ROM, tissue texture, asymmetry and tenderness. Pt tolerated procedure well.  OMT Treatment 01/28/2018 12/18/2017 11/19/2017 10/22/2017 09/24/2017 08/28/2017 07/23/2017  Head/Face - - - - - - -  Head/Face Response - - - - - - -  Neck  - - MFR;ST;LAS CS;MFR;ST;IND/INR - IND/INR;MFR;ST;DIR CS;IND/INR;MFR;ST;DIR  Neck Response - - I I - I I  T1-T4  - - - - - - -  T1-T4 Response - - - - - - -  T-5-T9  - - - - - - -  T5-T9 Response  - - - - - - -  Ribs  - - - - - - -  Ribs Response - - - - - - -  Lumbar MFR;ST;IND/INR MFR;ST;IND/INR MFR;ST;IND/INR LAS;MFR;ST;IND/INR;DIR LAS;MFR;ST;IND/INR;DIR IND/INR;LAS;MFR;ST IND/INR;LAS;MFR;ST  Lumbar Response I I I I I I I  Sacrum MFR;ST;IND/INR MFR;ST;IND/INR MFR;ST;IND/INR MFR;ST;IND/INR MFR;ST;IND/INR IND/INR;MFR;ST IND/INR;MFR;ST  Sacrum Response I I I I I I I  Pelvis MFR;ST;LAS;IND/INR;DIR MFR;ST;LAS;IND/INR;DIR MFR;ST;LAS MFR;ST;LAS LAS;MFR;ST;IND/INR;DIR LAS;MFR;ST;IND/INR LAS;MFR;ST;IND/INR  Pelvis Response I I I I I I I  Upper Ext. - I - I - - -  Upper Ext. Response - LAS;MFR;ST - LAS;MFR;ST - - -  Lower Ext. MFR;ST;LAS;IND/INR MFR;ST;LAS;IND/INR MFR;ST;LAS;IND/INR MFR;ST;LAS;IND/INR LAS;MFR;ST;IND/INR;DIR LAS;MFR;ST;IND/INR;DIR LAS;MFR;ST;IND/INR;DIR  Lower Ext. Response I I I I I I I   Push fluids and rest today. No heavy lifting. May resume nml activity in the AM.  Continue current medications as ordered - resume metformin and take daily  Follow up with specialists as scheduled  Apply cool compress and alternate with warm compress to left shin  Follow up in 5 weeks for OMM   [facility] S. Perlie Gold  [facility] [place] [facility] [address] [place], [place] [address] [phone] Cell (Monday-Friday 8 AM - 5 PM) [phone] After 5 PM and follow prompts

## 2018-01-28 NOTE — Patient Instructions (Addendum)
Push fluids and rest today. No heavy lifting. May resume nml activity in the AM.  Continue current medications as ordered  Follow up with specialists as scheduled  Apply cool compress and alternate with warm compress to left shin  Follow up in 5 weeks for OMM

## 2018-01-29 DIAGNOSIS — D1722 Benign lipomatous neoplasm of skin and subcutaneous tissue of left arm: Secondary | ICD-10-CM | POA: Insufficient documentation

## 2018-01-29 DIAGNOSIS — M62838 Other muscle spasm: Secondary | ICD-10-CM | POA: Insufficient documentation

## 2018-02-13 HISTORY — PX: SHOULDER SURGERY: SHX246

## 2018-03-03 ENCOUNTER — Ambulatory Visit: Payer: Self-pay | Admitting: Surgery

## 2018-03-04 ENCOUNTER — Encounter: Payer: BC Managed Care – PPO | Admitting: Internal Medicine

## 2018-03-18 ENCOUNTER — Ambulatory Visit (INDEPENDENT_AMBULATORY_CARE_PROVIDER_SITE_OTHER): Payer: BC Managed Care – PPO | Admitting: Internal Medicine

## 2018-03-18 ENCOUNTER — Encounter: Payer: Self-pay | Admitting: Internal Medicine

## 2018-03-18 VITALS — BP 110/64 | HR 80 | Temp 97.5°F | Resp 10 | Ht 60.0 in | Wt 220.0 lb

## 2018-03-18 DIAGNOSIS — R600 Localized edema: Secondary | ICD-10-CM

## 2018-03-18 DIAGNOSIS — K529 Noninfective gastroenteritis and colitis, unspecified: Secondary | ICD-10-CM

## 2018-03-18 DIAGNOSIS — Z9889 Other specified postprocedural states: Secondary | ICD-10-CM | POA: Diagnosis not present

## 2018-03-18 LAB — CBC WITH DIFFERENTIAL/PLATELET
BASOS PCT: 0.3 %
Basophils Absolute: 20 cells/uL (ref 0–200)
EOS PCT: 1.6 %
Eosinophils Absolute: 107 cells/uL (ref 15–500)
HCT: 38 % (ref 35.0–45.0)
Hemoglobin: 11.6 g/dL — ABNORMAL LOW (ref 11.7–15.5)
Lymphs Abs: 2131 cells/uL (ref 850–3900)
MCH: 20.9 pg — ABNORMAL LOW (ref 27.0–33.0)
MCHC: 30.5 g/dL — ABNORMAL LOW (ref 32.0–36.0)
MCV: 68.5 fL — ABNORMAL LOW (ref 80.0–100.0)
MPV: 10.7 fL (ref 7.5–12.5)
Monocytes Relative: 6.4 %
NEUTROS ABS: 4013 {cells}/uL (ref 1500–7800)
Neutrophils Relative %: 59.9 %
Platelets: 340 10*3/uL (ref 140–400)
RBC: 5.55 10*6/uL — ABNORMAL HIGH (ref 3.80–5.10)
RDW: 15.4 % — ABNORMAL HIGH (ref 11.0–15.0)
Total Lymphocyte: 31.8 %
WBC mixed population: 429 cells/uL (ref 200–950)
WBC: 6.7 10*3/uL (ref 3.8–10.8)

## 2018-03-18 LAB — BASIC METABOLIC PANEL WITH GFR
BUN/Creatinine Ratio: 16 (calc) (ref 6–22)
BUN: 17 mg/dL (ref 7–25)
CHLORIDE: 101 mmol/L (ref 98–110)
CO2: 29 mmol/L (ref 20–32)
Calcium: 10.1 mg/dL (ref 8.6–10.4)
Creat: 1.07 mg/dL — ABNORMAL HIGH (ref 0.50–1.05)
GFR, Est African American: 67 mL/min/{1.73_m2} (ref >=60)
GFR, Est Non African American: 58 mL/min/{1.73_m2} — ABNORMAL LOW (ref >=60)
Glucose, Bld: 113 mg/dL (ref 65–139)
POTASSIUM: 3.8 mmol/L (ref 3.5–5.3)
Sodium: 138 mmol/L (ref 135–146)

## 2018-03-18 MED ORDER — PROAIR HFA 108 (90 BASE) MCG/ACT IN AERS: 2.0000 | INHALATION_SPRAY | Freq: Four times a day (QID) | RESPIRATORY_TRACT | 5 refills | Status: DC | PRN

## 2018-03-18 MED ORDER — CIPROFLOXACIN HCL 250 MG PO TABS: 250.0000 mg | ORAL_TABLET | Freq: Two times a day (BID) | ORAL | 0 refills | Status: DC

## 2018-03-18 NOTE — Patient Instructions (Addendum)
Push fluids and rest  Recommend OTC imodium as needed for diarrhea  START CIPRO 250MG  2 TIMES DAILY FOR 7 DAYS  Will call with lab results  Follow up in 2-3 weeks for OMM/gatroenteritis   Food Choices to Help Relieve Diarrhea, Adult When you have diarrhea, the foods you eat and your eating habits are very important. Choosing the right foods and drinks can help:  Relieve diarrhea.  Replace lost fluids and nutrients.  Prevent dehydration.  What general guidelines should I follow? Relieving diarrhea  Choose foods with less than 2 g or .07 oz. of fiber per serving.  Limit fats to less than 8 tsp (38 g or 1.34 oz.) a day.  Avoid the following: ? Foods and beverages sweetened with high-fructose corn syrup, honey, or sugar alcohols such as xylitol, sorbitol, and mannitol. ? Foods that contain a lot of fat or sugar. ? Fried, greasy, or spicy foods. ? High-fiber grains, breads, and cereals. ? Raw fruits and vegetables.  Eat foods that are rich in probiotics. These foods include dairy products such as yogurt and fermented milk products. They help increase healthy bacteria in the stomach and intestines (gastrointestinal tract, or GI tract).  If you have lactose intolerance, avoid dairy products. These may make your diarrhea worse.  Take medicine to help stop diarrhea (antidiarrheal medicine) only as told by your health care provider. Replacing nutrients  Eat small meals or snacks every 3-4 hours.  Eat bland foods, such as white rice, toast, or baked potato, until your diarrhea starts to get better. Gradually reintroduce nutrient-rich foods as tolerated or as told by your health care provider. This includes: ? Well-cooked protein foods. ? Peeled, seeded, and soft-cooked fruits and vegetables. ? Low-fat dairy products.  Take vitamin and mineral supplements as told by your health care provider. Preventing dehydration   Start by sipping water or a special solution to prevent  dehydration (oral rehydration solution, ORS). Urine that is clear or pale yellow means that you are getting enough fluid.  Try to drink at least 8-10 cups of fluid each day to help replace lost fluids.  You may add other liquids in addition to water, such as clear juice or decaffeinated sports drinks, as tolerated or as told by your health care provider.  Avoid drinks with caffeine, such as coffee, tea, or soft drinks.  Avoid alcohol. What foods are recommended? The items listed may not be a complete list. Talk with your health care provider about what dietary choices are best for you. Grains White rice. White, [place], or pita breads (fresh or toasted), including plain rolls, buns, or bagels. White pasta. Saltine, soda, or graham crackers. Pretzels. Low-fiber cereal. Cooked cereals made with water (such as cornmeal, farina, or cream cereals). Plain muffins. Matzo. Melba toast. Zwieback. Vegetables Potatoes (without the skin). Most well-cooked and canned vegetables without skins or seeds. Tender lettuce. Fruits Apple sauce. Fruits canned in juice. Cooked apricots, cherries, grapefruit, peaches, pears, or plums. Fresh bananas and cantaloupe. Meats and other protein foods Baked or boiled chicken. Eggs. Tofu. Fish. Seafood. Smooth nut butters. Ground or well-cooked tender beef, ham, veal, lamb, pork, or poultry. Dairy Plain yogurt, kefir, and unsweetened liquid yogurt. Lactose-free milk, buttermilk, skim milk, or soy milk. Low-fat or nonfat hard cheese. Beverages Water. Low-calorie sports drinks. Fruit juices without pulp. Strained tomato and vegetable juices. Decaffeinated teas. Sugar-free beverages not sweetened with sugar alcohols. Oral rehydration solutions, if approved by your health care provider. Seasoning and other foods Bouillon, broth, or  soups made from recommended foods. What foods are not recommended? The items listed may not be a complete list. Talk with your health care provider  about what dietary choices are best for you. Grains Whole grain, whole wheat, bran, or rye breads, rolls, pastas, and crackers. Wild or brown rice. Whole grain or bran cereals. Barley. Oats and oatmeal. Corn tortillas or taco shells. Granola. Popcorn. Vegetables Raw vegetables. Fried vegetables. Cabbage, broccoli, Brussels sprouts, artichokes, baked beans, beet greens, corn, kale, legumes, peas, sweet potatoes, and yams. Potato skins. Cooked spinach and cabbage. Fruits Dried fruit, including raisins and dates. Raw fruits. Stewed or dried prunes. Canned fruits with syrup. Meat and other protein foods Fried or fatty meats. Deli meats. Chunky nut butters. Nuts and seeds. Beans and lentils. Berniece Salines. Hot dogs. Sausage. Dairy High-fat cheeses. Whole milk, chocolate milk, and beverages made with milk, such as milk shakes. Half-and-half. Cream. sour cream. Ice cream. Beverages Caffeinated beverages (such as coffee, tea, soda, or energy drinks). Alcoholic beverages. Fruit juices with pulp. Prune juice. Soft drinks sweetened with high-fructose corn syrup or sugar alcohols. High-calorie sports drinks. Fats and oils Butter. Cream sauces. Margarine. Salad oils. Plain salad dressings. Olives. Avocados. Mayonnaise. Sweets and desserts Sweet rolls, doughnuts, and sweet breads. Sugar-free desserts sweetened with sugar alcohols such as xylitol and sorbitol. Seasoning and other foods Honey. Hot sauce. Chili powder. Gravy. Cream-based or milk-based soups. Pancakes and waffles. Summary  When you have diarrhea, the foods you eat and your eating habits are very important.  Make sure you get at least 8-10 cups of fluid each day, or enough to keep your urine clear or pale yellow.  Eat bland foods and gradually reintroduce healthy, nutrient-rich foods as tolerated, or as told by your health care provider.  Avoid high-fiber, fried, greasy, or spicy foods. This information is not intended to replace advice given to  you by your health care provider. Make sure you discuss any questions you have with your health care provider. Document Released: 12/22/2003 Document Revised: 09/28/2016 Document Reviewed: 09/28/2016 Elsevier Interactive Patient Education  [employer].

## 2018-03-18 NOTE — Progress Notes (Signed)
Patient ID: Mackenzie Weber, female   DOB: June 18, 1961, 57 y.o.   MRN: [id number]   Location:  [facility] OFFICE  Provider: DR Arletha Grippe  Code Status: FULL CODE Goals of Care:  Advanced Directives 11/19/2017  Does Patient Have a Medical Advance Directive? No  Would patient like information on creating a medical advance directive? No - Patient declined     Chief Complaint  Patient presents with  . OMT    OMT/OMM treatment  . GI Problem    Diarrhea and upset stomach   . Medication Refill    Refill [facility]- [facility]     HPI: Patient is a 57 y.o. female seen today for chronic back pain. She has GI c/o today - diarrhea and upset stomach x 4 days. Weight down 3 lbs. She had chicken grilled on charcoal grill on 03/15/18. Sx's began 1 hr after consumption - started with increased belching --> abdominal cramping and diarrhea (watery stool x 10 on 03/16/18). She has gone 3 times today already. No bloody stools. No f/c. She feels nauseated but no emesis. She is drinking plenty of fluid. Tried pepto bismul 03/16/18 which helped a little. Appetite reduced. She was traveling a lot and had significant swelling in her legs. She took a dose of lasix yesterday.  She needs a RF on [facility]   Past Medical History:  Diagnosis Date  . Anemia   . Asthma   . Foot pain, right   . Hypertension   . IBS (irritable bowel syndrome)   . Sciatica of left side   . Seasonal allergies     Past Surgical History:  Procedure Laterality Date  . ABDOMINAL HYSTERECTOMY    . APPENDECTOMY    . BLADDER SUSPENSION  2012   TVT  . BREAST SURGERY  2001   /biopsy-benign  . CHOLECYSTECTOMY    . EXCISIONAL HEMORRHOIDECTOMY    . KNEE SURGERY     right  . SHOULDER SURGERY Left 02/13/2018   Lipoma removed   . Small Bowel Surgery    . TONSILLECTOMY    . TOTAL ABDOMINAL HYSTERECTOMY W/ BILATERAL SALPINGOOPHORECTOMY  1989   LSO-1987; [id number]  . WRIST SURGERY     carpal tunnel repair     reports that she has never smoked. She  has never used smokeless tobacco. She reports that she does not drink alcohol or use drugs. Social History   Socioeconomic History  . Marital status: Married    Spouse name: Not on file  . Number of children: Not on file  . Years of education: Not on file  . Highest education level: Not on file  Occupational History  . Occupation: retired in 2015 from [employer].  Social Needs  . Financial resource strain: Not on file  . Food insecurity:    Worry: Not on file    Inability: Not on file  . Transportation needs:    Medical: Not on file    Non-medical: Not on file  Tobacco Use  . Smoking status: Never Smoker  . Smokeless tobacco: Never Used  Substance and Sexual Activity  . Alcohol use: No  . Drug use: No  . Sexual activity: Yes    Partners: Male    Birth control/protection: Surgical  Lifestyle  . Physical activity:    Days per week: Not on file    Minutes per session: Not on file  . Stress: Not on file  Relationships  . Social connections:  Talks on phone: Not on file    Gets together: Not on file    Attends religious service: Not on file    Active member of club or organization: Not on file    Attends meetings of clubs or organizations: Not on file    Relationship status: Not on file  . Intimate partner violence:    Fear of current or ex partner: Not on file    Emotionally abused: Not on file    Physically abused: Not on file    Forced sexual activity: Not on file  Other Topics Concern  . Not on file  Social History Narrative  . Not on file    Family History  Problem Relation Age of Onset  . Diabetes Father   . Hypertension Father   . Diabetes Mother   . Hypertension Mother   . Asthma Mother   . Diabetes Brother   . Hypertension Sister   . Diabetes Sister   . Cancer Sister 19       breast  . Hypertension Paternal Uncle     Allergies  Allergen Reactions  . Cortisone     Red area around injection site x 49mh  . Depo-Medrol [Methylprednisolone  Acetate] Nausea Only and Other (See Comments)    Dizziness  . Aspirin Anxiety and Other (See Comments)    Rapid Heart beat    Outpatient Encounter Medications as of 03/18/2018  Medication Sig  . cyclobenzaprine (FLEXERIL) 10 MG tablet Take 0.5-1 tablets (5-10 mg total) by mouth 2 (two) times daily as needed for muscle spasms.  . Digestive Enzymes (DIGESTIVE ENZYME PO) Take 2 (two) times daily by mouth.  . EPINEPHrine (EPIPEN JR) 0.15 MG/0.3ML injection Inject 0.15 mg into the muscle daily as needed for anaphylaxis.   .Marland Kitchenesomeprazole (NEXIUM) 40 MG capsule Take 1 capsule (40 mg total) by mouth 2 (two) times daily before a meal.  . estradiol (ESTRACE) 0.1 MG/GM vaginal cream Place 08.18Applicatorfuls vaginally daily.  .Marland Kitchenipratropium (ATROVENT) 0.06 % nasal spray Place 2 sprays into both nostrils 4 (four) times daily.  .Marland Kitchenlosartan-hydrochlorothiazide (HYZAAR) 100-12.5 MG tablet TAKE 1 TABLET BY MOUTH EVERY DAY  . meloxicam (MOBIC) 7.5 MG tablet Take 1 tablet (7.5 mg total) by mouth daily. May take up to 2 tabs po daily prn severe pain  . metFORMIN (GLUCOPHAGE) 500 MG tablet Take 0.5 tablets (250 mg total) by mouth 2 (two) times daily with a meal.  . Multiple Vitamin (MULTIVITAMIN) capsule Take 1 capsule by mouth daily.  .Marland KitchenPROAIR HFA 108 (90 Base) MCG/ACT inhaler Inhale 2 puff into the lungs every 6 hours as needed for wheezing or shortness of breath  . SYMBICORT 80-4.5 MCG/ACT inhaler INHALE 2 PUFFS FIRST THING IN THE MORNING AND THEN INHALE ANOTHER 2 PUFFS ABOUT 12 HOURS LATER  . triamterene-hydrochlorothiazide (MAXZIDE) 75-50 MG tablet TAKE 1 TABLET BY MOUTH EVERY DAY   No facility-administered encounter medications on file as of 03/18/2018.     Review of Systems:  Review of Systems  Cardiovascular: Positive for leg swelling.  Gastrointestinal: Positive for abdominal distention, abdominal pain, diarrhea and nausea.  All other systems reviewed and are negative.   Health Maintenance  Topic  Date Due  . PNEUMOCOCCAL POLYSACCHARIDE VACCINE (1) 02/19/1964  . OPHTHALMOLOGY EXAM  02/19/1972  . TETANUS/TDAP  02/18/1981  . INFLUENZA VACCINE  06/24/2018 (Originally 05/15/2018)  . Hepatitis C Screening  10/15/2018 (Originally 51963/03/16  . HIV Screening  10/15/2018 (Originally 02/18/1977)  . HEMOGLOBIN A1C  07/24/2018  . MAMMOGRAM  11/15/2018  . FOOT EXAM  12/21/2018  . PAP SMEAR  11/16/2019  . COLONOSCOPY  07/29/2024    Physical Exam: Vitals:   03/18/18 1307  BP: 110/64  Pulse: 80  Resp: 10  Temp: (!) 97.5 F (36.4 C)  TempSrc: Oral  SpO2: 96%  Weight: 220 lb (99.8 kg)  Height: 5' (1.524 m)   Body mass index is 42.97 kg/m. Physical Exam  Constitutional: She is oriented to person, place, and time. She appears well-developed and well-nourished.  Looks ill in NAD  HENT:  Mouth/Throat: Oropharynx is clear and moist. No oropharyngeal exudate.  MMM; no oral thrush  Eyes: Pupils are equal, round, and reactive to light. No scleral icterus.  Neck: Neck supple. Carotid bruit is not present.  Cardiovascular: Normal rate, regular rhythm, normal heart sounds and intact distal pulses. Exam reveals no gallop and no friction rub.  No murmur heard. +1 pitting LE edema b/l. no calf TTP.   Pulmonary/Chest: Effort normal and breath sounds normal. No stridor. No respiratory distress. She has no wheezes. She has no rales.  Abdominal: Soft. Normal appearance. She exhibits distension. She exhibits no mass. Bowel sounds are increased. There is no hepatomegaly. There is tenderness. There is no rigidity, no rebound and no guarding. No hernia.  Musculoskeletal: She exhibits edema.  Left anterior shoulder incision well healed with min swelling. No redness or dehiscence  Lymphadenopathy:    She has no cervical adenopathy.  Neurological: She is alert and oriented to person, place, and time. She has normal reflexes.  Skin: Skin is warm and dry. No rash noted.  Psychiatric: She has a normal mood and  affect. Her behavior is normal. Judgment and thought content normal.    Labs reviewed: Basic Metabolic Panel: Recent Labs    04/23/17 1441 01/22/18 0823  NA 138 140  K 4.0 4.0  CL 102 104  CO2 26 30  GLUCOSE 102* 99  BUN 14 14  CREATININE 0.85 0.82  CALCIUM 9.9 9.6  MG 2.2  --    Liver Function Tests: Recent Labs    04/23/17 1441 01/22/18 0823  AST 14  --   ALT 10 11  ALKPHOS 73  --   BILITOT 0.2  --   PROT 7.1  --   ALBUMIN 4.1  --    No results for input(s): LIPASE, AMYLASE in the last 8760 hours. No results for input(s): AMMONIA in the last 8760 hours. CBC: No results for input(s): WBC, NEUTROABS, HGB, HCT, MCV, PLT in the last 8760 hours. Lipid Panel: Recent Labs    01/22/18 0823  CHOL 159  HDL 56  LDLCALC 90  TRIG 45  CHOLHDL 2.8   Lab Results  Component Value Date   HGBA1C 6.5 (H) 01/22/2018    Procedures since last visit: No results found.  Assessment/Plan   ICD-10-CM   1. Gastroenteritis K52.9 BMP with eGFR([facility])    CBC with Differential/Platelets    ciprofloxacin (CIPRO) 250 MG tablet  2. Bilateral lower extremity edema R60.0   3. Status post shoulder surgery Z98.890    lipoma resection; left   No OMT today due to acute illness  Push fluids and rest  Recommend OTC imodium as needed for diarrhea  START CIPRO 250MG 2 TIMES DAILY FOR 7 DAYS  Will call with lab results  Follow up in 2-3 weeks for OMM/gastroenteritis   [facility] [place], [place] [address] [phone] Cell (Monday-Friday 8 AM - 5 PM) [phone] After 5 PM and follow prompts

## 2018-04-15 ENCOUNTER — Encounter: Payer: Self-pay | Admitting: Internal Medicine

## 2018-04-15 ENCOUNTER — Ambulatory Visit (INDEPENDENT_AMBULATORY_CARE_PROVIDER_SITE_OTHER): Payer: BC Managed Care – PPO | Admitting: Internal Medicine

## 2018-04-15 VITALS — BP 120/72 | HR 75 | Temp 98.6°F | Ht 60.0 in | Wt 221.0 lb

## 2018-04-15 DIAGNOSIS — M9905 Segmental and somatic dysfunction of pelvic region: Secondary | ICD-10-CM | POA: Diagnosis not present

## 2018-04-15 DIAGNOSIS — M25551 Pain in right hip: Secondary | ICD-10-CM

## 2018-04-15 DIAGNOSIS — M9906 Segmental and somatic dysfunction of lower extremity: Secondary | ICD-10-CM | POA: Diagnosis not present

## 2018-04-15 DIAGNOSIS — G8929 Other chronic pain: Secondary | ICD-10-CM

## 2018-04-15 DIAGNOSIS — M9904 Segmental and somatic dysfunction of sacral region: Secondary | ICD-10-CM | POA: Diagnosis not present

## 2018-04-15 DIAGNOSIS — M25512 Pain in left shoulder: Secondary | ICD-10-CM

## 2018-04-15 DIAGNOSIS — N289 Disorder of kidney and ureter, unspecified: Secondary | ICD-10-CM

## 2018-04-15 DIAGNOSIS — M5442 Lumbago with sciatica, left side: Secondary | ICD-10-CM

## 2018-04-15 DIAGNOSIS — M9903 Segmental and somatic dysfunction of lumbar region: Secondary | ICD-10-CM

## 2018-04-15 DIAGNOSIS — J302 Other seasonal allergic rhinitis: Secondary | ICD-10-CM

## 2018-04-15 DIAGNOSIS — J45909 Unspecified asthma, uncomplicated: Secondary | ICD-10-CM

## 2018-04-15 LAB — BASIC METABOLIC PANEL
BUN: 15 mg/dL (ref 7–25)
CO2: 31 mmol/L (ref 20–32)
CREATININE: 0.89 mg/dL (ref 0.50–1.05)
Calcium: 9.8 mg/dL (ref 8.6–10.4)
Chloride: 104 mmol/L (ref 98–110)
GLUCOSE: 91 mg/dL (ref 65–139)
POTASSIUM: 3.8 mmol/L (ref 3.5–5.3)
SODIUM: 140 mmol/L (ref 135–146)

## 2018-04-15 MED ORDER — KETOROLAC TROMETHAMINE 30 MG/ML IJ SOLN
30.0000 mg | Freq: Once | INTRAMUSCULAR | Status: AC
  Administered 2018-04-15: 30 mg via INTRAMUSCULAR

## 2018-04-15 MED ORDER — MONTELUKAST SODIUM 10 MG PO TABS: 10.0000 mg | ORAL_TABLET | Freq: Every day | ORAL | 6 refills | Status: DC

## 2018-04-15 NOTE — Patient Instructions (Addendum)
Push fluids and rest today. No heavy lifting. May resume nml activity in the AM.  TORADOL 60MG  INJECTION GIVEN TODAY  Continue other medications as ordered  START GENERIC SINGULAIR 10MG  AT BEDTIME FOR SEASONAL ALLERGY  RECOMMEND PLAIN CLARITIN OTC DAILY FOR SEASONAL ALLERGY  Follow up in 4 weeks for OMM or sooner if need be.

## 2018-04-15 NOTE — Progress Notes (Signed)
Patient ID: Mackenzie Weber, female   DOB: 1961-03-07, 57 y.o.   MRN: [id number]   Location:  [facility] OFFICE  Provider: DR Arletha Grippe  Code Status:  Goals of Care:  Advanced Directives 11/19/2017  Does Patient Have a Medical Advance Directive? No  Would patient like information on creating a medical advance directive? No - Patient declined     Chief Complaint  Patient presents with  . OMM    2-3 week OMM/OMT   . URI    Hoarness, cough-dry, and ear concerns. Symptoms onset 1 week ago Sunday     HPI: Patient is a 57 y.o. female seen today for OMM f/u and chronic pain. She reports dry cough with associated hoarseness, decreased right hearing, dizziness and ear pressure. No sinus pressure, sore throat, HA, CP, SOB, f/c. She is using HFA more due to coughing. No wheezing.  Pain in left shoulder is worse but no redness or d/c. Incision sensation is returning. Pain in right hip is worse as she has been sleeping on that side. No loss of bowel/bladder control.  She was seen at wt loss center yesterday. She is no longer eligible for wt loss surgery as wt has improved on its own with conservative tx and lifestyle modification. Current BMI 43.16   Past Medical History:  Diagnosis Date  . Anemia   . Asthma   . Foot pain, right   . Hypertension   . IBS (irritable bowel syndrome)   . Sciatica of left side   . Seasonal allergies     Past Surgical History:  Procedure Laterality Date  . ABDOMINAL HYSTERECTOMY    . APPENDECTOMY    . BLADDER SUSPENSION  2012   TVT  . BREAST SURGERY  2001   /biopsy-benign  . CHOLECYSTECTOMY    . EXCISIONAL HEMORRHOIDECTOMY    . KNEE SURGERY     right  . SHOULDER SURGERY Left 02/13/2018   Lipoma removed   . Small Bowel Surgery    . TONSILLECTOMY    . TOTAL ABDOMINAL HYSTERECTOMY W/ BILATERAL SALPINGOOPHORECTOMY  1989   LSO-1987; [id number]  . WRIST SURGERY     carpal tunnel repair     reports that she has never smoked. She has never used smokeless  tobacco. She reports that she does not drink alcohol or use drugs. Social History   Socioeconomic History  . Marital status: Married    Spouse name: Not on file  . Number of children: Not on file  . Years of education: Not on file  . Highest education level: Not on file  Occupational History  . Occupation: retired in 2015 from [employer].  Social Needs  . Financial resource strain: Not on file  . Food insecurity:    Worry: Not on file    Inability: Not on file  . Transportation needs:    Medical: Not on file    Non-medical: Not on file  Tobacco Use  . Smoking status: Never Smoker  . Smokeless tobacco: Never Used  Substance and Sexual Activity  . Alcohol use: No  . Drug use: No  . Sexual activity: Yes    Partners: Male    Birth control/protection: Surgical  Lifestyle  . Physical activity:    Days per week: Not on file    Minutes per session: Not on file  . Stress: Not on file  Relationships  . Social connections:    Talks on phone: Not on file  Gets together: Not on file    Attends religious service: Not on file    Active member of club or organization: Not on file    Attends meetings of clubs or organizations: Not on file    Relationship status: Not on file  . Intimate partner violence:    Fear of current or ex partner: Not on file    Emotionally abused: Not on file    Physically abused: Not on file    Forced sexual activity: Not on file  Other Topics Concern  . Not on file  Social History Narrative  . Not on file    Family History  Problem Relation Age of Onset  . Diabetes Father   . Hypertension Father   . Diabetes Mother   . Hypertension Mother   . Asthma Mother   . Diabetes Brother   . Hypertension Sister   . Diabetes Sister   . Cancer Sister 49       breast  . Hypertension Paternal Uncle     Allergies  Allergen Reactions  . Cortisone     Red area around injection site x 53mth  . Depo-Medrol [Methylprednisolone Acetate] Nausea Only and  Other (See Comments)    Dizziness  . Aspirin Anxiety and Other (See Comments)    Rapid Heart beat    Outpatient Encounter Medications as of 04/15/2018  Medication Sig  . ciprofloxacin (CIPRO) 250 MG tablet Take 1 tablet (250 mg total) by mouth 2 (two) times daily.  . cyclobenzaprine (FLEXERIL) 10 MG tablet Take 0.5-1 tablets (5-10 mg total) by mouth 2 (two) times daily as needed for muscle spasms.  . Digestive Enzymes (DIGESTIVE ENZYME PO) Take 2 (two) times daily by mouth.  . EPINEPHrine (EPIPEN JR) 0.15 MG/0.3ML injection Inject 0.15 mg into the muscle daily as needed for anaphylaxis.   Marland Kitchen esomeprazole (NEXIUM) 40 MG capsule Take 1 capsule (40 mg total) by mouth 2 (two) times daily before a meal.  . estradiol (ESTRACE) 0.1 MG/GM vaginal cream Place 5.88 Applicatorfuls vaginally daily.  Marland Kitchen ipratropium (ATROVENT) 0.06 % nasal spray Place 2 sprays into both nostrils 4 (four) times daily.  Marland Kitchen losartan-hydrochlorothiazide (HYZAAR) 100-12.5 MG tablet TAKE 1 TABLET BY MOUTH EVERY DAY  . meloxicam (MOBIC) 7.5 MG tablet Take 1 tablet (7.5 mg total) by mouth daily. May take up to 2 tabs po daily prn severe pain  . metFORMIN (GLUCOPHAGE) 500 MG tablet Take 0.5 tablets (250 mg total) by mouth 2 (two) times daily with a meal.  . Multiple Vitamin (MULTIVITAMIN) capsule Take 1 capsule by mouth daily.  Marland Kitchen PROAIR HFA 108 (90 Base) MCG/ACT inhaler Inhale 2 puffs into the lungs every 6 (six) hours as needed for wheezing or shortness of breath. Inhale 2 puff into the lungs every 6 hours as needed for wheezing or shortness of breath  . SYMBICORT 80-4.5 MCG/ACT inhaler INHALE 2 PUFFS FIRST THING IN THE MORNING AND THEN INHALE ANOTHER 2 PUFFS ABOUT 12 HOURS LATER  . triamterene-hydrochlorothiazide (MAXZIDE) 75-50 MG tablet TAKE 1 TABLET BY MOUTH EVERY DAY   No facility-administered encounter medications on file as of 04/15/2018.     Review of Systems:  Review of Systems  Constitutional: Positive for fatigue.  HENT:  Positive for congestion, postnasal drip and voice change.   Respiratory: Positive for cough and wheezing.   Cardiovascular: Positive for chest pain.  Musculoskeletal: Positive for arthralgias, back pain and gait problem.  All other systems reviewed and are negative.   Health Maintenance  Topic Date Due  . PNEUMOCOCCAL POLYSACCHARIDE VACCINE (1) 02/19/1964  . OPHTHALMOLOGY EXAM  02/19/1972  . TETANUS/TDAP  02/18/1981  . INFLUENZA VACCINE  06/24/2018 (Originally 05/15/2018)  . Hepatitis C Screening  10/15/2018 (Originally 1961-07-10)  . HIV Screening  10/15/2018 (Originally 02/18/1977)  . HEMOGLOBIN A1C  07/24/2018  . MAMMOGRAM  11/15/2018  . FOOT EXAM  12/21/2018  . PAP SMEAR  11/16/2019  . COLONOSCOPY  07/29/2024    Physical Exam: Vitals:   04/15/18 1308  BP: 120/72  Pulse: 75  Temp: 98.6 F (37 C)  TempSrc: Oral  SpO2: 96%  Weight: 221 lb (100.2 kg)  Height: 5' (1.524 m)   Body mass index is 43.16 kg/m. Physical Exam  Constitutional: She is oriented to person, place, and time. She appears well-developed and well-nourished.    HENT:  TMs intact, dull b/l but no redness; left maxillary sinus TTP with boggy tissue texture changes; oropharynx cobblestoning and redness but no exudate  Eyes: Pupils are equal, round, and reactive to light.  Neck: Neck supple.  Cardiovascular: Normal rate, regular rhythm and intact distal pulses. Exam reveals no gallop and no friction rub.  Murmur (1/6 SEM) heard. +1 pitting LE edema b/l. No calf TTP   Pulmonary/Chest: Effort normal and breath sounds normal. No stridor. No respiratory distress. She has no wheezes. She has no rales. She exhibits tenderness.  Prolonged expiratory phase  Musculoskeletal: She exhibits edema and tenderness.   SI joint restriction R>L  ASIS TTP right  Pelvic right out flare  Short leg right  Sacral torsion (+)  Paravertebral muscle hypertrophy with ropy tissue texture changes lumbar, thoracic and  cervical  OA extended     Lymphadenopathy:    She has no cervical adenopathy.       Right: No supraclavicular adenopathy present.       Left: No supraclavicular adenopathy present.  Neurological: She is alert and oriented to person, place, and time.  Skin: Skin is warm and dry. No rash noted.  Left shoulder anterior incisional scar with no redness, d/c or dehiscence. (+) TTP  Psychiatric: She has a normal mood and affect. Her behavior is normal. Judgment and thought content normal.    Labs reviewed: Basic Metabolic Panel: Recent Labs    04/23/17 1441 01/22/18 0823 03/18/18 1357  NA 138 140 138  K 4.0 4.0 3.8  CL 102 104 101  CO2 26 30 29   GLUCOSE 102* 99 113  BUN 14 14 17   CREATININE 0.85 0.82 1.07*  CALCIUM 9.9 9.6 10.1  MG 2.2  --   --    Liver Function Tests: Recent Labs    04/23/17 1441 01/22/18 0823  AST 14  --   ALT 10 11  ALKPHOS 73  --   BILITOT 0.2  --   PROT 7.1  --   ALBUMIN 4.1  --    No results for input(s): LIPASE, AMYLASE in the last 8760 hours. No results for input(s): AMMONIA in the last 8760 hours. CBC: Recent Labs    03/18/18 1357  WBC 6.7  NEUTROABS 4,013  HGB 11.6*  HCT 38.0  MCV 68.5*  PLT 340   Lipid Panel: Recent Labs    01/22/18 0823  CHOL 159  HDL 56  LDLCALC 90  TRIG 45  CHOLHDL 2.8   Lab Results  Component Value Date   HGBA1C 6.5 (H) 01/22/2018    Procedures since last visit: No results found.  Assessment/Plan   ICD-10-CM   1. Seasonal  allergies J30.2 montelukast (SINGULAIR) 10 MG tablet  2. Pain in right hip M25.551 ketorolac (TORADOL) 30 MG/ML injection 30 mg  3. Left shoulder pain, unspecified chronicity M25.512 ketorolac (TORADOL) 30 MG/ML injection 30 mg  4. Asthma, chronic, unspecified asthma severity, uncomplicated T62.563   5. Chronic bilateral low back pain with left-sided sciatica M54.42    G89.29   6. Somatic dysfunction of pelvic region M99.05   7. Somatic dysfunction of lumbar region M99.03    8. Somatic dysfunction of lower extremity M99.06   9. Somatic dysfunction of sacral region M99.04   10. Renal insufficiency S93.7 Basic metabolic panel   PROCEDURE NOTE:  After verbal consent obtained, OMT utilized with improvement in ROM, tissue texture, asymmetry and tenderness. Pt tolerated procedure well.   OMT Treatment 04/15/2018 01/28/2018 12/18/2017 11/19/2017 10/22/2017 09/24/2017 08/28/2017  Head/Face - - - - - - -  Head/Face Response - - - - - - -  Neck  MFR;ST;LAS;CS - - MFR;ST;LAS CS;MFR;ST;IND/INR - IND/INR;MFR;ST;DIR  Neck Response I - - I I - I  T1-T4  - - - - - - -  T1-T4 Response - - - - - - -  T-5-T9  - - - - - - -  T5-T9 Response - - - - - - -  Ribs  - - - - - - -  Ribs Response - - - - - - -  Lumbar MFR;ST;IND/INR;DIR MFR;ST;IND/INR MFR;ST;IND/INR MFR;ST;IND/INR LAS;MFR;ST;IND/INR;DIR LAS;MFR;ST;IND/INR;DIR IND/INR;LAS;MFR;ST  Lumbar Response I I I I I I I  Sacrum MFR;ST;IND/INR MFR;ST;IND/INR MFR;ST;IND/INR MFR;ST;IND/INR MFR;ST;IND/INR MFR;ST;IND/INR IND/INR;MFR;ST  Sacrum Response I I I I I I I  Pelvis MFR;ST;LAS;IND/INR;DIR MFR;ST;LAS;IND/INR;DIR MFR;ST;LAS;IND/INR;DIR MFR;ST;LAS MFR;ST;LAS LAS;MFR;ST;IND/INR;DIR LAS;MFR;ST;IND/INR  Pelvis Response I I I I I I I  Upper Ext. - - I - I - -  Upper Ext. Response - - LAS;MFR;ST - LAS;MFR;ST - -  Lower Ext. MFR;ST;LAS;IND/INR MFR;ST;LAS;IND/INR MFR;ST;LAS;IND/INR MFR;ST;LAS;IND/INR MFR;ST;LAS;IND/INR LAS;MFR;ST;IND/INR;DIR LAS;MFR;ST;IND/INR;DIR  Lower Ext. Response I I I I I I I    Push fluids and rest today. No heavy lifting. May resume nml activity in the AM.  TORADOL 60MG  INJECTION GIVEN TODAY  Continue other medications as ordered  START GENERIC SINGULAIR 10MG  AT BEDTIME FOR SEASONAL ALLERGY  RECOMMEND PLAIN CLARITIN OTC DAILY FOR SEASONAL ALLERGY  Follow up in 4 weeks for OMM or sooner if need be.  Mackenzie Weber  [facility] [address] [place], [place] [address] [phone] Cell (Monday-Friday 8 AM - 5 PM) [phone] After 5 PM and follow prompts

## 2018-04-16 ENCOUNTER — Encounter: Payer: Self-pay | Admitting: Internal Medicine

## 2018-05-04 ENCOUNTER — Other Ambulatory Visit: Payer: Self-pay | Admitting: Internal Medicine

## 2018-05-12 HISTORY — PX: ESOPHAGOGASTRODUODENOSCOPY: SHX1529

## 2018-05-12 LAB — HM COLONOSCOPY

## 2018-05-20 ENCOUNTER — Encounter: Payer: Self-pay | Admitting: Internal Medicine

## 2018-05-20 ENCOUNTER — Ambulatory Visit (INDEPENDENT_AMBULATORY_CARE_PROVIDER_SITE_OTHER): Payer: BC Managed Care – PPO | Admitting: Internal Medicine

## 2018-05-20 VITALS — BP 134/78 | HR 81 | Temp 98.4°F | Ht 60.0 in | Wt 218.0 lb

## 2018-05-20 DIAGNOSIS — M25551 Pain in right hip: Secondary | ICD-10-CM

## 2018-05-20 DIAGNOSIS — M5440 Lumbago with sciatica, unspecified side: Secondary | ICD-10-CM | POA: Diagnosis not present

## 2018-05-20 DIAGNOSIS — J302 Other seasonal allergic rhinitis: Secondary | ICD-10-CM | POA: Diagnosis not present

## 2018-05-20 DIAGNOSIS — M9906 Segmental and somatic dysfunction of lower extremity: Secondary | ICD-10-CM | POA: Diagnosis not present

## 2018-05-20 DIAGNOSIS — M25512 Pain in left shoulder: Secondary | ICD-10-CM

## 2018-05-20 DIAGNOSIS — R600 Localized edema: Secondary | ICD-10-CM

## 2018-05-20 DIAGNOSIS — M9905 Segmental and somatic dysfunction of pelvic region: Secondary | ICD-10-CM

## 2018-05-20 DIAGNOSIS — J3089 Other allergic rhinitis: Secondary | ICD-10-CM | POA: Insufficient documentation

## 2018-05-20 DIAGNOSIS — M9904 Segmental and somatic dysfunction of sacral region: Secondary | ICD-10-CM

## 2018-05-20 DIAGNOSIS — M9903 Segmental and somatic dysfunction of lumbar region: Secondary | ICD-10-CM

## 2018-05-20 DIAGNOSIS — M5442 Lumbago with sciatica, left side: Secondary | ICD-10-CM

## 2018-05-20 DIAGNOSIS — G8929 Other chronic pain: Secondary | ICD-10-CM

## 2018-05-20 NOTE — Progress Notes (Signed)
Patient ID: Mackenzie Weber, female   DOB: 06/21/1961, 57 y.o.   MRN: [id number]   Location:  [facility] OFFICE  Provider: DR Arletha Grippe  Code Status:  Goals of Care:  Advanced Directives 11/19/2017  Does Patient Have a Medical Advance Directive? No  Would patient like information on creating a medical advance directive? No - Patient declined     Chief Complaint  Patient presents with  . Medical Management of Chronic Issues    Pt is being seen for an OMM visit.     HPI: Patient is a 57 y.o. female seen today for medical management of chronic diseases.  She c/o "nagging" cough with "clumpy lumpy stuff" produced. She has post nasal drip.   Pain is stable overall. Left shoulder causes discomfort. She does wear a bra daily. She has right hip pain due to sleeping on right side. No falls lately. She has been keeping her 38 mo old grandson. No loss of bowel/bladder control.  Morbid obesity - Current BMI 42.58. She lost 3 lbs since last month  Past Medical History:  Diagnosis Date  . Anemia   . Asthma   . Foot pain, right   . Hypertension   . IBS (irritable bowel syndrome)   . Sciatica of left side   . Seasonal allergies     Past Surgical History:  Procedure Laterality Date  . ABDOMINAL HYSTERECTOMY    . APPENDECTOMY    . BLADDER SUSPENSION  2012   TVT  . BREAST SURGERY  2001   /biopsy-benign  . CHOLECYSTECTOMY    . EXCISIONAL HEMORRHOIDECTOMY    . KNEE SURGERY     right  . SHOULDER SURGERY Left 02/13/2018   Lipoma removed   . Small Bowel Surgery    . TONSILLECTOMY    . TOTAL ABDOMINAL HYSTERECTOMY W/ BILATERAL SALPINGOOPHORECTOMY  1989   LSO-1987; [id number]  . WRIST SURGERY     carpal tunnel repair     reports that she has never smoked. She has never used smokeless tobacco. She reports that she does not drink alcohol or use drugs. Social History   Socioeconomic History  . Marital status: Married    Spouse name: Not on file  . Number of children: Not on file  .  Years of education: Not on file  . Highest education level: Not on file  Occupational History  . Occupation: retired in 2015 from [employer].  Social Needs  . Financial resource strain: Not on file  . Food insecurity:    Worry: Not on file    Inability: Not on file  . Transportation needs:    Medical: Not on file    Non-medical: Not on file  Tobacco Use  . Smoking status: Never Smoker  . Smokeless tobacco: Never Used  Substance and Sexual Activity  . Alcohol use: No  . Drug use: No  . Sexual activity: Yes    Partners: Male    Birth control/protection: Surgical  Lifestyle  . Physical activity:    Days per week: Not on file    Minutes per session: Not on file  . Stress: Not on file  Relationships  . Social connections:    Talks on phone: Not on file    Gets together: Not on file    Attends religious service: Not on file    Active member of club or organization: Not on file    Attends meetings of clubs or organizations: Not on file  Relationship status: Not on file  . Intimate partner violence:    Fear of current or ex partner: Not on file    Emotionally abused: Not on file    Physically abused: Not on file    Forced sexual activity: Not on file  Other Topics Concern  . Not on file  Social History Narrative  . Not on file    Family History  Problem Relation Age of Onset  . Diabetes Father   . Hypertension Father   . Diabetes Mother   . Hypertension Mother   . Asthma Mother   . Diabetes Brother   . Hypertension Sister   . Diabetes Sister   . Cancer Sister 23       breast  . Hypertension Paternal Uncle     Allergies  Allergen Reactions  . Cortisone     Red area around injection site x 41mth  . Depo-Medrol [Methylprednisolone Acetate] Nausea Only and Other (See Comments)    Dizziness  . Aspirin Anxiety and Other (See Comments)    Rapid Heart beat    Outpatient Encounter Medications as of 05/20/2018  Medication Sig  . cyclobenzaprine (FLEXERIL) 10  MG tablet Take 0.5-1 tablets (5-10 mg total) by mouth 2 (two) times daily as needed for muscle spasms.  . Digestive Enzymes (DIGESTIVE ENZYME PO) Take 2 (two) times daily by mouth.  . EPINEPHrine (EPIPEN JR) 0.15 MG/0.3ML injection Inject 0.15 mg into the muscle daily as needed for anaphylaxis.   Marland Kitchen esomeprazole (NEXIUM) 40 MG capsule Take 1 capsule (40 mg total) by mouth 2 (two) times daily before a meal.  . estradiol (ESTRACE) 0.1 MG/GM vaginal cream Place 8.25 Applicatorfuls vaginally daily.  Marland Kitchen ipratropium (ATROVENT) 0.06 % nasal spray Place 2 sprays into both nostrils 4 (four) times daily.  Marland Kitchen losartan-hydrochlorothiazide (HYZAAR) 100-12.5 MG tablet TAKE 1 TABLET BY MOUTH EVERY DAY  . meloxicam (MOBIC) 7.5 MG tablet Take 1 tablet (7.5 mg total) by mouth daily. May take up to 2 tabs po daily prn severe pain  . metFORMIN (GLUCOPHAGE) 500 MG tablet Take 0.5 tablets (250 mg total) by mouth 2 (two) times daily with a meal.  . montelukast (SINGULAIR) 10 MG tablet Take 1 tablet (10 mg total) by mouth at bedtime. FOR SEASONAL ALLERGY  . Multiple Vitamin (MULTIVITAMIN) capsule Take 1 capsule by mouth daily.  Marland Kitchen PROAIR HFA 108 (90 Base) MCG/ACT inhaler Inhale 2 puffs into the lungs every 6 (six) hours as needed for wheezing or shortness of breath. Inhale 2 puff into the lungs every 6 hours as needed for wheezing or shortness of breath  . SYMBICORT 80-4.5 MCG/ACT inhaler INHALE 2 PUFFS FIRST THING IN THE MORNING AND THEN INHALE ANOTHER 2 PUFFS ABOUT 12 HOURS LATER  . triamterene-hydrochlorothiazide (MAXZIDE) 75-50 MG tablet TAKE 1 TABLET BY MOUTH EVERY DAY  . [DISCONTINUED] ciprofloxacin (CIPRO) 250 MG tablet Take 1 tablet (250 mg total) by mouth 2 (two) times daily.   No facility-administered encounter medications on file as of 05/20/2018.     Review of Systems:  Review of Systems  HENT: Positive for postnasal drip.   Respiratory: Positive for cough and wheezing.   Musculoskeletal: Positive for  arthralgias, back pain and joint swelling.  All other systems reviewed and are negative.   Health Maintenance  Topic Date Due  . PNEUMOCOCCAL POLYSACCHARIDE VACCINE (1) 02/19/1964  . OPHTHALMOLOGY EXAM  02/19/1972  . TETANUS/TDAP  02/18/1981  . INFLUENZA VACCINE  06/24/2018 (Originally 05/15/2018)  . Hepatitis C Screening  10/15/2018 (Originally 11/06/61)  . HIV Screening  10/15/2018 (Originally 02/18/1977)  . HEMOGLOBIN A1C  07/24/2018  . MAMMOGRAM  11/15/2018  . FOOT EXAM  12/21/2018  . PAP SMEAR  11/16/2019  . COLONOSCOPY  07/29/2024    Physical Exam: Vitals:   05/20/18 1259  BP: 134/78  Pulse: 81  Temp: 98.4 F (36.9 C)  TempSrc: Oral  SpO2: 99%  Weight: 218 lb (98.9 kg)  Height: 5' (1.524 m)   Body mass index is 42.58 kg/m. Physical Exam  Constitutional: She is oriented to person, place, and time. She appears well-developed and well-nourished.    HENT:  Maxillary sinus TTP with boggy tissue texture changes; oropharynx cobblestoning and redness but no exudate  Cardiovascular:  Murmur (1/6 SEM) heard. +1 pitting LE edema b/l. No calf TTP  Musculoskeletal: She exhibits edema and tenderness.  Standing flexion test (+) right  SI joint restriction L>R  ASIS TTP left  Pelvic left pelvic inflare  Short leg - none  Sacral torsion (+)  Paravertebral muscle hypertrophy with ropy tissue texture changes in lumbar, thoracic and cervical muscles  Increased lumbar lordosis     Neurological: She is alert and oriented to person, place, and time.  Skin: Skin is warm and dry. No rash noted.  Psychiatric: She has a normal mood and affect. Her behavior is normal. Judgment and thought content normal.    Labs reviewed: Basic Metabolic Panel: Recent Labs    01/22/18 0823 03/18/18 1357 04/15/18 1430  NA 140 138 140  K 4.0 3.8 3.8  CL 104 101 104  CO2 30 29 31   GLUCOSE 99 113 91  BUN 14 17 15   CREATININE 0.82 1.07* 0.89  CALCIUM 9.6 10.1 9.8   Liver Function  Tests: Recent Labs    01/22/18 0823  ALT 11   No results for input(s): LIPASE, AMYLASE in the last 8760 hours. No results for input(s): AMMONIA in the last 8760 hours. CBC: Recent Labs    03/18/18 1357  WBC 6.7  NEUTROABS 4,013  HGB 11.6*  HCT 38.0  MCV 68.5*  PLT 340   Lipid Panel: Recent Labs    01/22/18 0823  CHOL 159  HDL 56  LDLCALC 90  TRIG 45  CHOLHDL 2.8   Lab Results  Component Value Date   HGBA1C 6.5 (H) 01/22/2018    Procedures since last visit: No results found.  Assessment/Plan   ICD-10-CM   1. Seasonal allergies J30.2   2. Pain in right hip M25.551   3. Left shoulder pain, unspecified chronicity M25.512   4. Chronic bilateral low back pain with left-sided sciatica M54.42    G89.29   5. Bilateral lower extremity edema R60.0   6. Somatic dysfunction of pelvic region M99.05   7. Somatic dysfunction of lumbar region M99.03   8. Somatic dysfunction of sacral region M99.04   9. Somatic dysfunction of lower extremity M99.06     PROCEDURE NOTE:  After verbal consent obtained, OMT utilized with improvement in ROM, tissue texture, asymmetry and tenderness. Pt tolerated procedure well. OMT Treatment 05/20/2018 04/15/2018 01/28/2018 12/18/2017 11/19/2017 10/22/2017 09/24/2017  Head/Face - - - - - - -  Head/Face Response - - - - - - -  Neck  - MFR;ST;LAS;CS - - MFR;ST;LAS CS;MFR;ST;IND/INR -  Neck Response - I - - I I -  T1-T4  - - - - - - -  T1-T4 Response - - - - - - -  T-5-T9  - - - - - - -  T5-T9 Response - - - - - - -  Ribs  - - - - - - -  Ribs Response - - - - - - -  Lumbar MFR;ST;IND/INR;DIR MFR;ST;IND/INR;DIR MFR;ST;IND/INR MFR;ST;IND/INR MFR;ST;IND/INR LAS;MFR;ST;IND/INR;DIR LAS;MFR;ST;IND/INR;DIR  Lumbar Response I I I I I I I  Sacrum MFR;ST;IND/INR MFR;ST;IND/INR MFR;ST;IND/INR MFR;ST;IND/INR MFR;ST;IND/INR MFR;ST;IND/INR MFR;ST;IND/INR  Sacrum Response I I I I I I I  Pelvis MFR;ST;LAS;IND/INR;DIR MFR;ST;LAS;IND/INR;DIR MFR;ST;LAS;IND/INR;DIR  MFR;ST;LAS;IND/INR;DIR MFR;ST;LAS MFR;ST;LAS LAS;MFR;ST;IND/INR;DIR  Pelvis Response I I I I I I I  Upper Ext. - - - I - I -  Upper Ext. Response - - - LAS;MFR;ST - LAS;MFR;ST -  Lower Ext. MFR;ST;LAS;IND/INR MFR;ST;LAS;IND/INR MFR;ST;LAS;IND/INR MFR;ST;LAS;IND/INR MFR;ST;LAS;IND/INR MFR;ST;LAS;IND/INR LAS;MFR;ST;IND/INR;DIR  Lower Ext. Response I I I I I I I   RESUME SINGULAIR 1 TABLET AT BEDTIME FOR SEASONAL ALLERGY  Continue other medications as ordered  Push fluids and rest today. No heavy lifting. May resume nml activity in the AM.  Follow up in 4 weeks for OMM, chronic back pain or sooner if need be    [facility] S. Perlie Gold  [facility] and Adult Medicine [address] [place], [place] [address] [phone] Cell (Monday-Friday 8 AM - 5 PM) [phone] After 5 PM and follow prompts

## 2018-05-20 NOTE — Patient Instructions (Signed)
RESUME SINGULAIR 1 TABLET AT BEDTIME FOR SEASONAL ALLERGY  Continue other medications as ordered  Push fluids and rest today. No heavy lifting. May resume nml activity in the AM.  Follow up in 4 weeks for OMM, chronic back pain or sooner if need be

## 2018-05-24 ENCOUNTER — Other Ambulatory Visit: Payer: Self-pay | Admitting: Internal Medicine

## 2018-05-24 MED ORDER — CEFDINIR 300 MG PO CAPS: 300.0000 mg | ORAL_CAPSULE | Freq: Two times a day (BID) | ORAL | 0 refills | Status: DC

## 2018-06-17 ENCOUNTER — Ambulatory Visit (INDEPENDENT_AMBULATORY_CARE_PROVIDER_SITE_OTHER): Payer: BC Managed Care – PPO | Admitting: Internal Medicine

## 2018-06-17 ENCOUNTER — Encounter: Payer: Self-pay | Admitting: Internal Medicine

## 2018-06-17 VITALS — BP 128/82 | HR 71 | Temp 98.1°F | Ht 60.0 in | Wt 221.2 lb

## 2018-06-17 DIAGNOSIS — M9904 Segmental and somatic dysfunction of sacral region: Secondary | ICD-10-CM | POA: Diagnosis not present

## 2018-06-17 DIAGNOSIS — M5440 Lumbago with sciatica, unspecified side: Secondary | ICD-10-CM

## 2018-06-17 DIAGNOSIS — M9905 Segmental and somatic dysfunction of pelvic region: Secondary | ICD-10-CM | POA: Diagnosis not present

## 2018-06-17 DIAGNOSIS — M9906 Segmental and somatic dysfunction of lower extremity: Secondary | ICD-10-CM

## 2018-06-17 DIAGNOSIS — M9903 Segmental and somatic dysfunction of lumbar region: Secondary | ICD-10-CM

## 2018-06-17 DIAGNOSIS — G8929 Other chronic pain: Secondary | ICD-10-CM | POA: Diagnosis not present

## 2018-06-17 DIAGNOSIS — M5441 Lumbago with sciatica, right side: Secondary | ICD-10-CM

## 2018-06-17 DIAGNOSIS — M25551 Pain in right hip: Secondary | ICD-10-CM

## 2018-06-17 NOTE — Progress Notes (Signed)
Patient ID: Mackenzie Weber, female   DOB: 1961/01/22, 57 y.o.   MRN: [id number]   Location:  [facility] OFFICE  Provider: DR Arletha Grippe  Code Status: Goals of Care:  Advanced Directives 11/19/2017  Does Patient Have a Medical Advance Directive? No  Would patient like information on creating a medical advance directive? No - Patient declined     Chief Complaint  Patient presents with  . Medical Management of Chronic Issues    Pt is being seen for a 4 week OMM visit.   . Audit C Screening    score of 0    HPI: Patient is a 57 y.o. female seen today for medical management of chronic diseases.    Pain is slightly better in right hip. She continues to sleep on right side due to left hsoulder pain and numbness. Pain controlled with meloxicam. No loss of bowel/bladder control.   Past Medical History:  Diagnosis Date  . Anemia   . Asthma   . Foot pain, right   . Hypertension   . IBS (irritable bowel syndrome)   . Sciatica of left side   . Seasonal allergies     Past Surgical History:  Procedure Laterality Date  . ABDOMINAL HYSTERECTOMY    . APPENDECTOMY    . BLADDER SUSPENSION  2012   TVT  . BREAST SURGERY  2001   /biopsy-benign  . CHOLECYSTECTOMY    . EXCISIONAL HEMORRHOIDECTOMY    . KNEE SURGERY     right  . SHOULDER SURGERY Left 02/13/2018   Lipoma removed   . Small Bowel Surgery    . TONSILLECTOMY    . TOTAL ABDOMINAL HYSTERECTOMY W/ BILATERAL SALPINGOOPHORECTOMY  1989   LSO-1987; [id number]  . WRIST SURGERY     carpal tunnel repair     reports that she has never smoked. She has never used smokeless tobacco. She reports that she does not drink alcohol or use drugs. Social History   Socioeconomic History  . Marital status: Married    Spouse name: Not on file  . Number of children: Not on file  . Years of education: Not on file  . Highest education level: Not on file  Occupational History  . Occupation: retired in 2015 from [employer].  Social Needs    . Financial resource strain: Not on file  . Food insecurity:    Worry: Not on file    Inability: Not on file  . Transportation needs:    Medical: Not on file    Non-medical: Not on file  Tobacco Use  . Smoking status: Never Smoker  . Smokeless tobacco: Never Used  Substance and Sexual Activity  . Alcohol use: No  . Drug use: No  . Sexual activity: Yes    Partners: Male    Birth control/protection: Surgical  Lifestyle  . Physical activity:    Days per week: Not on file    Minutes per session: Not on file  . Stress: Not on file  Relationships  . Social connections:    Talks on phone: Not on file    Gets together: Not on file    Attends religious service: Not on file    Active member of club or organization: Not on file    Attends meetings of clubs or organizations: Not on file    Relationship status: Not on file  . Intimate partner violence:    Fear of current or ex partner: Not on file  Emotionally abused: Not on file    Physically abused: Not on file    Forced sexual activity: Not on file  Other Topics Concern  . Not on file  Social History Narrative  . Not on file    Family History  Problem Relation Age of Onset  . Diabetes Father   . Hypertension Father   . Diabetes Mother   . Hypertension Mother   . Asthma Mother   . Diabetes Brother   . Hypertension Sister   . Diabetes Sister   . Cancer Sister 52       breast  . Hypertension Paternal Uncle     Allergies  Allergen Reactions  . Cortisone     Red area around injection site x 93mth  . Depo-Medrol [Methylprednisolone Acetate] Nausea Only and Other (See Comments)    Dizziness  . Aspirin Anxiety and Other (See Comments)    Rapid Heart beat    Outpatient Encounter Medications as of 06/17/2018  Medication Sig  . cyclobenzaprine (FLEXERIL) 10 MG tablet Take 0.5-1 tablets (5-10 mg total) by mouth 2 (two) times daily as needed for muscle spasms.  . Digestive Enzymes (DIGESTIVE ENZYME PO) Take 2 (two) times  daily by mouth.  . EPINEPHrine (EPIPEN JR) 0.15 MG/0.3ML injection Inject 0.15 mg into the muscle daily as needed for anaphylaxis.   Marland Kitchen esomeprazole (NEXIUM) 40 MG capsule Take 1 capsule (40 mg total) by mouth 2 (two) times daily before a meal.  . estradiol (ESTRACE) 0.1 MG/GM vaginal cream Place 2.02 Applicatorfuls vaginally daily.  Marland Kitchen losartan-hydrochlorothiazide (HYZAAR) 100-12.5 MG tablet TAKE 1 TABLET BY MOUTH EVERY DAY  . meloxicam (MOBIC) 7.5 MG tablet Take 1 tablet (7.5 mg total) by mouth daily. May take up to 2 tabs po daily prn severe pain  . metFORMIN (GLUCOPHAGE) 500 MG tablet Take 0.5 tablets (250 mg total) by mouth 2 (two) times daily with a meal.  . montelukast (SINGULAIR) 10 MG tablet Take 1 tablet (10 mg total) by mouth at bedtime. FOR SEASONAL ALLERGY  . Multiple Vitamin (MULTIVITAMIN) capsule Take 1 capsule by mouth daily.  Marland Kitchen PROAIR HFA 108 (90 Base) MCG/ACT inhaler Inhale 2 puffs into the lungs every 6 (six) hours as needed for wheezing or shortness of breath. Inhale 2 puff into the lungs every 6 hours as needed for wheezing or shortness of breath  . SYMBICORT 80-4.5 MCG/ACT inhaler INHALE 2 PUFFS FIRST THING IN THE MORNING AND THEN INHALE ANOTHER 2 PUFFS ABOUT 12 HOURS LATER  . triamterene-hydrochlorothiazide (MAXZIDE) 75-50 MG tablet TAKE 1 TABLET BY MOUTH EVERY DAY  . [DISCONTINUED] cefdinir (OMNICEF) 300 MG capsule Take 1 capsule (300 mg total) by mouth 2 (two) times daily. For sinusitis  . [DISCONTINUED] ipratropium (ATROVENT) 0.06 % nasal spray Place 2 sprays into both nostrils 4 (four) times daily.   No facility-administered encounter medications on file as of 06/17/2018.     Review of Systems:  Review of Systems  Musculoskeletal: Positive for arthralgias and back pain.  All other systems reviewed and are negative.   Health Maintenance  Topic Date Due  . PNEUMOCOCCAL POLYSACCHARIDE VACCINE AGE 16-64 HIGH RISK  02/19/1964  . OPHTHALMOLOGY EXAM  02/19/1972  .  TETANUS/TDAP  02/18/1981  . INFLUENZA VACCINE  06/24/2018 (Originally 05/15/2018)  . Hepatitis C Screening  10/15/2018 (Originally 02[birth year]10[birth year]62)  . HIV Screening  10/15/2018 (Originally 02/18/1977)  . HEMOGLOBIN A1C  07/24/2018  . MAMMOGRAM  11/15/2018  . FOOT EXAM  12/21/2018  . PAP SMEAR  11/16/2019  .  COLONOSCOPY  07/29/2024    Physical Exam: Vitals:   06/17/18 1302  BP: 128/82  Pulse: 71  Temp: 98.1 F (36.7 C)  TempSrc: Oral  SpO2: 98%  Weight: 221 lb 3.2 oz (100.3 kg)  Height: 5' (1.524 m)   Body mass index is 43.2 kg/m. Physical Exam  Constitutional: She is oriented to person, place, and time. She appears well-developed and well-nourished.    Cardiovascular:  No LE edema b/l. No calf TTP   Musculoskeletal: She exhibits edema and tenderness.  Standing flexion test on left  SI joint restriction R>L  ASIS TTP right  Pelvic left inflare  Short leg none  Sacral torsion (+)  Paravertebral muscle hypertrophy with ropy tissue texture changes lumbar, thoracic and cervical  Increased lumbar lordosis   Neurological: She is alert and oriented to person, place, and time.  Skin: Skin is warm and dry. No rash noted.  Psychiatric: She has a normal mood and affect. Her behavior is normal. Judgment and thought content normal.    Labs reviewed: Basic Metabolic Panel: Recent Labs    01/22/18 0823 03/18/18 1357 04/15/18 1430  NA 140 138 140  K 4.0 3.8 3.8  CL 104 101 104  CO2 30 29 31   GLUCOSE 99 113 91  BUN 14 17 15   CREATININE 0.82 1.07* 0.89  CALCIUM 9.6 10.1 9.8   Liver Function Tests: Recent Labs    01/22/18 0823  ALT 11   No results for input(s): LIPASE, AMYLASE in the last 8760 hours. No results for input(s): AMMONIA in the last 8760 hours. CBC: Recent Labs    03/18/18 1357  WBC 6.7  NEUTROABS 4,013  HGB 11.6*  HCT 38.0  MCV 68.5*  PLT 340   Lipid Panel: Recent Labs    01/22/18 0823  CHOL 159  HDL 56  LDLCALC 90  TRIG 45  CHOLHDL 2.8     Lab Results  Component Value Date   HGBA1C 6.5 (H) 01/22/2018    Procedures since last visit: No results found.  Assessment/Plan   ICD-10-CM   1. Pain in right hip M25.551   2. Chronic bilateral low back pain with sciatica, sciatica laterality unspecified M54.40    G89.29   3. Somatic dysfunction of lower extremity M99.06   4. Somatic dysfunction of pelvic region M99.05   5. Somatic dysfunction of lumbar region M99.03   6. Somatic dysfunction of sacral region M99.04    PROCEDURE NOTE:  After verbal consent obtained, OMT utilized with improvement in ROM, tissue texture, asymmetry and tenderness. Pt tolerated procedure well.   OMT Treatment 06/17/2018 05/20/2018 04/15/2018 01/28/2018 12/18/2017 11/19/2017 10/22/2017  Head/Face - - - - - - -  Head/Face Response - - - - - - -  Neck  - - MFR;ST;LAS;CS - - MFR;ST;LAS CS;MFR;ST;IND/INR  Neck Response - - I - - I I  T1-T4  - - - - - - -  T1-T4 Response - - - - - - -  T-5-T9  - - - - - - -  T5-T9 Response - - - - - - -  Ribs  - - - - - - -  Ribs Response - - - - - - -  Lumbar MFR;ST;IND/INR;DIR MFR;ST;IND/INR;DIR MFR;ST;IND/INR;DIR MFR;ST;IND/INR MFR;ST;IND/INR MFR;ST;IND/INR LAS;MFR;ST;IND/INR;DIR  Lumbar Response I I I I I I I  Sacrum MFR;ST;IND/INR MFR;ST;IND/INR MFR;ST;IND/INR MFR;ST;IND/INR MFR;ST;IND/INR MFR;ST;IND/INR MFR;ST;IND/INR  Sacrum Response I I I I I I I  Pelvis MFR;ST;LAS;IND/INR;DIR;ME MFR;ST;LAS;IND/INR;DIR MFR;ST;LAS;IND/INR;DIR MFR;ST;LAS;IND/INR;DIR MFR;ST;LAS;IND/INR;DIR MFR;ST;LAS MFR;ST;LAS  Pelvis  Response I I I I I I I  Upper Ext. - - - - I - I  Upper Ext. Response - - - - LAS;MFR;ST - LAS;MFR;ST  Lower Ext. MFR;ST;LAS;IND/INR MFR;ST;LAS;IND/INR MFR;ST;LAS;IND/INR MFR;ST;LAS;IND/INR MFR;ST;LAS;IND/INR MFR;ST;LAS;IND/INR MFR;ST;LAS;IND/INR  Lower Ext. Response I I I I I I I   Push fluids and rest today. No heavy lifting. May resume nml activity in the AM.  Continue current medications as ordered  Recommend  ELEMIS CELLUTOX ACTIVE BODY OIL (cellulite and body cleansing oil) - use 3-4 drops daily to affected joint  Follow up in 4 weeks for OMM   [facility] S. Perlie Gold  [facility] and Adult Medicine [address] [place], [place] [address] [phone] Cell (Monday-Friday 8 AM - 5 PM) [phone] After 5 PM and follow prompts

## 2018-06-17 NOTE — Patient Instructions (Signed)
Push fluids and rest today. No heavy lifting. May resume nml activity in the AM.  Continue current medications as ordered  Recommend ELEMIS CELLUTOX ACTIVE BODY OIL (cellulite and body cleansing oil) - use 3-4 drops daily to affected joint  Follow up in 4 weeks for OMM

## 2018-06-19 ENCOUNTER — Encounter: Payer: Self-pay | Admitting: Internal Medicine

## 2018-07-15 ENCOUNTER — Ambulatory Visit: Payer: BC Managed Care – PPO | Admitting: Internal Medicine

## 2018-07-15 ENCOUNTER — Encounter: Payer: Self-pay | Admitting: Internal Medicine

## 2018-07-15 ENCOUNTER — Ambulatory Visit (INDEPENDENT_AMBULATORY_CARE_PROVIDER_SITE_OTHER): Payer: BC Managed Care – PPO | Admitting: Internal Medicine

## 2018-07-15 VITALS — BP 136/78 | HR 72 | Temp 98.2°F | Ht 60.0 in | Wt 222.6 lb

## 2018-07-15 DIAGNOSIS — M9904 Segmental and somatic dysfunction of sacral region: Secondary | ICD-10-CM

## 2018-07-15 DIAGNOSIS — G8929 Other chronic pain: Secondary | ICD-10-CM | POA: Diagnosis not present

## 2018-07-15 DIAGNOSIS — R600 Localized edema: Secondary | ICD-10-CM

## 2018-07-15 DIAGNOSIS — M9905 Segmental and somatic dysfunction of pelvic region: Secondary | ICD-10-CM | POA: Diagnosis not present

## 2018-07-15 DIAGNOSIS — M9906 Segmental and somatic dysfunction of lower extremity: Secondary | ICD-10-CM | POA: Diagnosis not present

## 2018-07-15 DIAGNOSIS — M9903 Segmental and somatic dysfunction of lumbar region: Secondary | ICD-10-CM | POA: Diagnosis not present

## 2018-07-15 DIAGNOSIS — M5440 Lumbago with sciatica, unspecified side: Secondary | ICD-10-CM

## 2018-07-15 DIAGNOSIS — J302 Other seasonal allergic rhinitis: Secondary | ICD-10-CM

## 2018-07-15 MED ORDER — KETOROLAC TROMETHAMINE 30 MG/ML IJ SOLN
30.0000 mg | Freq: Once | INTRAMUSCULAR | Status: AC
  Administered 2018-07-15: 30 mg via INTRAMUSCULAR

## 2018-07-15 NOTE — Progress Notes (Signed)
Patient ID: Mackenzie Weber, female   DOB: 1961/02/06, 57 y.o.   MRN: [id number]   Location:  [facility] OFFICE  Provider: DR Arletha Grippe  Code Status: FULL CODE Goals of Care:  Advanced Directives 11/19/2017  Does Patient Have a Medical Advance Directive? No  Would patient like information on creating a medical advance directive? No - Patient declined     Chief Complaint  Patient presents with  . Medical Management of Chronic Issues    OMM Treatment. She is also going to [facility] for weight loss management. She has been prescribed the lomaira and topiramate. She complains of her ears draining. No pain.     HPI: Patient is a 57 y.o. female seen today for medical management of chronic diseases. She has some sinus congestion and ear pressure. Weight stable. She takes oral wt loss most days. Followed by [facility]. She reports diarrhea on lomaira and topiramate.  Chronic pain syndrome - due to multiple joint pain; she takes meloxicam, prn flexeril. She has more LE swelling due to prolonged driving and sitting. She has increased right hip/leg pain due to increased water exercises and walking on track. No falls. No loss of bowel/bladder control.  Morbid obesity - Current BMI 43.47. She takes lomaira and topiramate. Followed by [facility] weight loss center  Past Medical History:  Diagnosis Date  . Anemia   . Asthma   . Foot pain, right   . Hypertension   . IBS (irritable bowel syndrome)   . Sciatica of left side   . Seasonal allergies     Past Surgical History:  Procedure Laterality Date  . ABDOMINAL HYSTERECTOMY    . APPENDECTOMY    . BLADDER SUSPENSION  2012   TVT  . BREAST SURGERY  2001   /biopsy-benign  . CHOLECYSTECTOMY    . EXCISIONAL HEMORRHOIDECTOMY    . KNEE SURGERY     right  . SHOULDER SURGERY Left 02/13/2018   Lipoma removed   . Small Bowel Surgery    . TONSILLECTOMY    . TOTAL ABDOMINAL HYSTERECTOMY W/ BILATERAL SALPINGOOPHORECTOMY  1989   LSO-1987;  [id number]  . WRIST SURGERY     carpal tunnel repair     reports that she has never smoked. She has never used smokeless tobacco. She reports that she does not drink alcohol or use drugs. Social History   Socioeconomic History  . Marital status: Married    Spouse name: Not on file  . Number of children: Not on file  . Years of education: Not on file  . Highest education level: Not on file  Occupational History  . Occupation: retired in 2015 from [employer].  Social Needs  . Financial resource strain: Not on file  . Food insecurity:    Worry: Not on file    Inability: Not on file  . Transportation needs:    Medical: Not on file    Non-medical: Not on file  Tobacco Use  . Smoking status: Never Smoker  . Smokeless tobacco: Never Used  Substance and Sexual Activity  . Alcohol use: No  . Drug use: No  . Sexual activity: Yes    Partners: Male    Birth control/protection: Surgical  Lifestyle  . Physical activity:    Days per week: Not on file    Minutes per session: Not on file  . Stress: Not on file  Relationships  . Social connections:    Talks on phone:  Not on file    Gets together: Not on file    Attends religious service: Not on file    Active member of club or organization: Not on file    Attends meetings of clubs or organizations: Not on file    Relationship status: Not on file  . Intimate partner violence:    Fear of current or ex partner: Not on file    Emotionally abused: Not on file    Physically abused: Not on file    Forced sexual activity: Not on file  Other Topics Concern  . Not on file  Social History Narrative  . Not on file    Family History  Problem Relation Age of Onset  . Diabetes Father   . Hypertension Father   . Diabetes Mother   . Hypertension Mother   . Asthma Mother   . Diabetes Brother   . Hypertension Sister   . Diabetes Sister   . Cancer Sister 18       breast  . Hypertension Paternal Uncle     Allergies  Allergen  Reactions  . Cortisone     Red area around injection site x 27mth  . Depo-Medrol [Methylprednisolone Acetate] Nausea Only and Other (See Comments)    Dizziness  . Aspirin Anxiety and Other (See Comments)    Rapid Heart beat    Outpatient Encounter Medications as of 07/15/2018  Medication Sig  . cyclobenzaprine (FLEXERIL) 10 MG tablet Take 0.5-1 tablets (5-10 mg total) by mouth 2 (two) times daily as needed for muscle spasms.  . Digestive Enzymes (DIGESTIVE ENZYME PO) Take 2 (two) times daily by mouth.  . EPINEPHrine (EPIPEN JR) 0.15 MG/0.3ML injection Inject 0.15 mg into the muscle daily as needed for anaphylaxis.   Marland Kitchen esomeprazole (NEXIUM) 40 MG capsule Take 1 capsule (40 mg total) by mouth 2 (two) times daily before a meal.  . estradiol (ESTRACE) 0.1 MG/GM vaginal cream Place 1.02 Applicatorfuls vaginally daily.  Marland Kitchen losartan-hydrochlorothiazide (HYZAAR) 100-12.5 MG tablet TAKE 1 TABLET BY MOUTH EVERY DAY  . meloxicam (MOBIC) 7.5 MG tablet Take 1 tablet (7.5 mg total) by mouth daily. May take up to 2 tabs po daily prn severe pain  . metFORMIN (GLUCOPHAGE) 500 MG tablet Take 0.5 tablets (250 mg total) by mouth 2 (two) times daily with a meal.  . montelukast (SINGULAIR) 10 MG tablet Take 1 tablet (10 mg total) by mouth at bedtime. FOR SEASONAL ALLERGY  . Multiple Vitamin (MULTIVITAMIN) capsule Take 1 capsule by mouth daily.  . Phentermine HCl (LOMAIRA) 8 MG TABS Take 8 mg by mouth daily.  Marland Kitchen PROAIR HFA 108 (90 Base) MCG/ACT inhaler Inhale 2 puffs into the lungs every 6 (six) hours as needed for wheezing or shortness of breath. Inhale 2 puff into the lungs every 6 hours as needed for wheezing or shortness of breath  . SYMBICORT 80-4.5 MCG/ACT inhaler INHALE 2 PUFFS FIRST THING IN THE MORNING AND THEN INHALE ANOTHER 2 PUFFS ABOUT 12 HOURS LATER  . topiramate (TOPAMAX) 25 MG tablet Take 25 mg by mouth daily.  Marland Kitchen triamterene-hydrochlorothiazide (MAXZIDE) 75-50 MG tablet TAKE 1 TABLET BY MOUTH EVERY DAY    No facility-administered encounter medications on file as of 07/15/2018.     Review of Systems:  Review of Systems  HENT: Positive for sinus pressure. Negative for ear pain.   Musculoskeletal: Positive for arthralgias and back pain.  Neurological: Negative for numbness.  All other systems reviewed and are negative.   Health Maintenance  Topic Date Due  . PNEUMOCOCCAL POLYSACCHARIDE VACCINE AGE 20-64 HIGH RISK  02/19/1964  . OPHTHALMOLOGY EXAM  02/19/1972  . TETANUS/TDAP  02/18/1981  . INFLUENZA VACCINE  05/15/2018  . Hepatitis C Screening  10/15/2018 (Originally 1961/01/10)  . HIV Screening  10/15/2018 (Originally 02/18/1977)  . HEMOGLOBIN A1C  07/24/2018  . MAMMOGRAM  11/15/2018  . FOOT EXAM  12/21/2018  . PAP SMEAR  11/16/2019  . COLONOSCOPY  07/29/2024    Physical Exam: There were no vitals filed for this visit. There is no height or weight on file to calculate BMI. Physical Exam  Constitutional: She is oriented to person, place, and time. She appears well-developed and well-nourished. No distress.    Looks uncomfortable in NAD  HENT:  TMs intact, dull b/l but no redness/d/c; L>R maxillary sinus TTP with boggy tissue texture changes; oropharynx cobblestoning and redness but no exudate; MMM; no oral thrush  Cardiovascular:  No LE edema b/l. No calf TTP   Musculoskeletal: She exhibits edema and tenderness.  Standing flexion test (+) left  SI joint restriction R>L  ASIS TTP R>L  Pelvic right in flare  Short leg left  Sacral torsion (+)  Paravertebral muscle hypertrophy with ropy tissue texture changes in lumbar, thoracic and cervical spine  OA extended  Increased lumbar lordosis  Neurological: She is alert and oriented to person, place, and time. Gait (antalgic) abnormal.  Skin: Skin is warm and dry. No rash noted.  Psychiatric: She has a normal mood and affect. Her behavior is normal. Judgment and thought content normal.    Labs reviewed: Basic Metabolic  Panel: Recent Labs    01/22/18 0823 03/18/18 1357 04/15/18 1430  NA 140 138 140  K 4.0 3.8 3.8  CL 104 101 104  CO2 30 29 31   GLUCOSE 99 113 91  BUN 14 17 15   CREATININE 0.82 1.07* 0.89  CALCIUM 9.6 10.1 9.8   Liver Function Tests: Recent Labs    01/22/18 0823  ALT 11   No results for input(s): LIPASE, AMYLASE in the last 8760 hours. No results for input(s): AMMONIA in the last 8760 hours. CBC: Recent Labs    03/18/18 1357  WBC 6.7  NEUTROABS 4,013  HGB 11.6*  HCT 38.0  MCV 68.5*  PLT 340   Lipid Panel: Recent Labs    01/22/18 0823  CHOL 159  HDL 56  LDLCALC 90  TRIG 45  CHOLHDL 2.8   Lab Results  Component Value Date   HGBA1C 6.5 (H) 01/22/2018    Procedures since last visit: No results found.  Assessment/Plan   ICD-10-CM   1. Chronic bilateral low back pain with sciatica, sciatica laterality unspecified M54.40 ketorolac (TORADOL) 30 MG/ML injection 30 mg   G89.29   2. Seasonal allergic rhinitis, unspecified trigger J30.2   3. Bilateral lower extremity edema R60.0   4. Somatic dysfunction of lumbar region M99.03   5. Somatic dysfunction of lower extremity M99.06   6. Somatic dysfunction of pelvic region M99.05   7. Somatic dysfunction of sacral region M99.04    PROCEDURE NOTE:  After verbal consent obtained, OMT utilized with improvement in ROM, tissue texture, asymmetry and tenderness. Pt tolerated procedure well. OMT Treatment 07/15/2018 06/17/2018 05/20/2018 04/15/2018 01/28/2018 12/18/2017 11/19/2017  Head/Face - - - - - - -  Head/Face Response - - - - - - -  Neck  - - - MFR;ST;LAS;CS - - MFR;ST;LAS  Neck Response - - - I - - I  T1-T4  - - - - - - -  T1-T4 Response - - - - - - -  T-5-T9  - - - - - - -  T5-T9 Response - - - - - - -  Ribs  - - - - - - -  Ribs Response - - - - - - -  Lumbar MFR;ST;IND/INR;DIR MFR;ST;IND/INR;DIR MFR;ST;IND/INR;DIR MFR;ST;IND/INR;DIR MFR;ST;IND/INR MFR;ST;IND/INR MFR;ST;IND/INR  Lumbar Response I I I I I I I  Sacrum  MFR;ST;IND/INR MFR;ST;IND/INR MFR;ST;IND/INR MFR;ST;IND/INR MFR;ST;IND/INR MFR;ST;IND/INR MFR;ST;IND/INR  Sacrum Response I I I I I I I  Pelvis MFR;ST;LAS;IND/INR;DIR;ME MFR;ST;LAS;IND/INR;DIR;ME MFR;ST;LAS;IND/INR;DIR MFR;ST;LAS;IND/INR;DIR MFR;ST;LAS;IND/INR;DIR MFR;ST;LAS;IND/INR;DIR MFR;ST;LAS  Pelvis Response I I I I I I I  Upper Ext. - - - - - I -  Upper Ext. Response - - - - - LAS;MFR;ST -  Lower Ext. MFR;ST;LAS;IND/INR MFR;ST;LAS;IND/INR MFR;ST;LAS;IND/INR MFR;ST;LAS;IND/INR MFR;ST;LAS;IND/INR MFR;ST;LAS;IND/INR MFR;ST;LAS;IND/INR  Lower Ext. Response I I I I I I I   Push fluids and rest today. No heavy lifting. May resume nml activity in the AM.  toradol 30mg  IM given today  Continue current medications as ordered  May take 600mg  (total 3 tabs of OTC) ibuprofen with food for pain   Follow up with weight loss center  Exercise as tolerated in moderation  Follow up with Wilfred Lacy, NP at [facility] in 1-2 mos  Follow up as scheduled for last OMT at end of October     Lavern Maslow S. Perlie Gold  [facility] [address] [place], [place] [address] [phone] Cell (Monday-Friday 8 AM - 5 PM) [phone] After 5 PM and follow prompts

## 2018-07-15 NOTE — Patient Instructions (Addendum)
Continue current medications as ordered  Push fluids and rest today. No heavy lifting. May resume nml activity in the AM.  May take 600mg  (total 3 tabs of OTC) ibuprofen with food for pain   Follow up with weight loss center  Exercise as tolerated in moderation  Follow up with Wilfred Lacy, NP at [facility] in 1-2 mos  Follow up as scheduled for last OMT

## 2018-08-12 ENCOUNTER — Encounter: Payer: Self-pay | Admitting: Internal Medicine

## 2018-08-12 ENCOUNTER — Ambulatory Visit (INDEPENDENT_AMBULATORY_CARE_PROVIDER_SITE_OTHER): Payer: BC Managed Care – PPO | Admitting: Internal Medicine

## 2018-08-12 VITALS — BP 112/62 | HR 80 | Temp 98.1°F | Ht 60.0 in | Wt 221.0 lb

## 2018-08-12 DIAGNOSIS — M9904 Segmental and somatic dysfunction of sacral region: Secondary | ICD-10-CM | POA: Diagnosis not present

## 2018-08-12 DIAGNOSIS — M9906 Segmental and somatic dysfunction of lower extremity: Secondary | ICD-10-CM

## 2018-08-12 DIAGNOSIS — M5440 Lumbago with sciatica, unspecified side: Secondary | ICD-10-CM

## 2018-08-12 DIAGNOSIS — G8929 Other chronic pain: Secondary | ICD-10-CM | POA: Diagnosis not present

## 2018-08-12 DIAGNOSIS — E119 Type 2 diabetes mellitus without complications: Secondary | ICD-10-CM

## 2018-08-12 DIAGNOSIS — M25551 Pain in right hip: Secondary | ICD-10-CM | POA: Diagnosis not present

## 2018-08-12 DIAGNOSIS — M9905 Segmental and somatic dysfunction of pelvic region: Secondary | ICD-10-CM | POA: Diagnosis not present

## 2018-08-12 DIAGNOSIS — M9903 Segmental and somatic dysfunction of lumbar region: Secondary | ICD-10-CM | POA: Diagnosis not present

## 2018-08-12 MED ORDER — METFORMIN HCL 500 MG PO TABS: 250.0000 mg | ORAL_TABLET | Freq: Two times a day (BID) | ORAL | 6 refills | Status: DC

## 2018-08-12 MED ORDER — TRIAMTERENE-HCTZ 75-50 MG PO TABS: 1.0000 | ORAL_TABLET | Freq: Every day | ORAL | 1 refills | Status: DC

## 2018-08-12 MED ORDER — BUDESONIDE-FORMOTEROL FUMARATE 80-4.5 MCG/ACT IN AERO: INHALATION_SPRAY | RESPIRATORY_TRACT | 5 refills | Status: DC

## 2018-08-12 MED ORDER — MELOXICAM 7.5 MG PO TABS: 7.5000 mg | ORAL_TABLET | Freq: Every day | ORAL | 6 refills | Status: DC

## 2018-08-12 MED ORDER — KETOROLAC TROMETHAMINE 30 MG/ML IJ SOLN
30.0000 mg | Freq: Once | INTRAMUSCULAR | Status: AC
  Administered 2018-08-12: 30 mg via INTRAMUSCULAR

## 2018-08-12 NOTE — Progress Notes (Signed)
Patient ID: Mackenzie Weber, female   DOB: Nov 24, 1960, 57 y.o.   MRN: [id number]   Location:  [facility] OFFICE  Provider: DR Arletha Grippe  Code Status:  Goals of Care:  Advanced Directives 11/19/2017  Does Patient Have a Medical Advance Directive? No  Would patient like information on creating a medical advance directive? No - Patient declined     Chief Complaint  Patient presents with  . OMM    OMM Treatment   . Medication Management    Discuss weigh loss medication     HPI: Patient is a 57 y.o. female seen today for medical management of chronic diseases.  Her pain has not changed much since last ov. Pain worse with prolonged standing/sitting and worse on right side. No numbness/tingling. No loss of bowel/bladder control.  Chronic pain syndrome - due to multiple joint pain; she takes meloxicam, prn flexeril. She has more LE swelling due to prolonged driving and sitting. She has increased right hip/leg pain due to increased water exercises and walking on track. No falls. No loss of bowel/bladder control.  Morbid obesity - Current BMI 43.16. Weight down 1 lb. When she weighed this AM at home. Weight was 214 lbs. She takes lomaira and topiramate. Followed by [facility] weight loss center   Past Medical History:  Diagnosis Date  . Anemia   . Asthma   . Foot pain, right   . Hypertension   . IBS (irritable bowel syndrome)   . Sciatica of left side   . Seasonal allergies     Past Surgical History:  Procedure Laterality Date  . ABDOMINAL HYSTERECTOMY    . APPENDECTOMY    . BLADDER SUSPENSION  2012   TVT  . BREAST SURGERY  2001   /biopsy-benign  . CHOLECYSTECTOMY    . EXCISIONAL HEMORRHOIDECTOMY    . KNEE SURGERY     right  . SHOULDER SURGERY Left 02/13/2018   Lipoma removed   . Small Bowel Surgery    . TONSILLECTOMY    . TOTAL ABDOMINAL HYSTERECTOMY W/ BILATERAL SALPINGOOPHORECTOMY  1989   LSO-1987; [id number]  . WRIST SURGERY     carpal tunnel repair     reports that  she has never smoked. She has never used smokeless tobacco. She reports that she does not drink alcohol or use drugs. Social History   Socioeconomic History  . Marital status: Married    Spouse name: Not on file  . Number of children: Not on file  . Years of education: Not on file  . Highest education level: Not on file  Occupational History  . Occupation: retired in 2015 from [employer].  Social Needs  . Financial resource strain: Not on file  . Food insecurity:    Worry: Not on file    Inability: Not on file  . Transportation needs:    Medical: Not on file    Non-medical: Not on file  Tobacco Use  . Smoking status: Never Smoker  . Smokeless tobacco: Never Used  Substance and Sexual Activity  . Alcohol use: No  . Drug use: No  . Sexual activity: Yes    Partners: Male    Birth control/protection: Surgical  Lifestyle  . Physical activity:    Days per week: Not on file    Minutes per session: Not on file  . Stress: Not on file  Relationships  . Social connections:    Talks on phone: Not on file  Gets together: Not on file    Attends religious service: Not on file    Active member of club or organization: Not on file    Attends meetings of clubs or organizations: Not on file    Relationship status: Not on file  . Intimate partner violence:    Fear of current or ex partner: Not on file    Emotionally abused: Not on file    Physically abused: Not on file    Forced sexual activity: Not on file  Other Topics Concern  . Not on file  Social History Narrative  . Not on file    Family History  Problem Relation Age of Onset  . Diabetes Father   . Hypertension Father   . Diabetes Mother   . Hypertension Mother   . Asthma Mother   . Diabetes Brother   . Hypertension Sister   . Diabetes Sister   . Cancer Sister 59       breast  . Hypertension Paternal Uncle     Allergies  Allergen Reactions  . Cortisone     Red area around injection site x 61mth  .  Depo-Medrol [Methylprednisolone Acetate] Nausea Only and Other (See Comments)    Dizziness  . Aspirin Anxiety and Other (See Comments)    Rapid Heart beat    Outpatient Encounter Medications as of 08/12/2018  Medication Sig  . Digestive Enzymes (DIGESTIVE ENZYME PO) Take 2 (two) times daily by mouth.  . EPINEPHrine (EPIPEN JR) 0.15 MG/0.3ML injection Inject 0.15 mg into the muscle daily as needed for anaphylaxis.   Marland Kitchen esomeprazole (NEXIUM) 40 MG capsule Take 1 capsule (40 mg total) by mouth 2 (two) times daily before a meal.  . estradiol (ESTRACE) 0.1 MG/GM vaginal cream Place 6.96 Applicatorfuls vaginally daily.  Marland Kitchen losartan-hydrochlorothiazide (HYZAAR) 100-12.5 MG tablet TAKE 1 TABLET BY MOUTH EVERY DAY  . meloxicam (MOBIC) 7.5 MG tablet Take 1 tablet (7.5 mg total) by mouth daily. May take up to 2 tabs po daily prn severe pain  . metFORMIN (GLUCOPHAGE) 500 MG tablet Take 0.5 tablets (250 mg total) by mouth 2 (two) times daily with a meal.  . montelukast (SINGULAIR) 10 MG tablet Take 1 tablet (10 mg total) by mouth at bedtime. FOR SEASONAL ALLERGY  . Multiple Vitamin (MULTIVITAMIN) capsule Take 1 capsule by mouth daily.  Marland Kitchen PROAIR HFA 108 (90 Base) MCG/ACT inhaler Inhale 2 puffs into the lungs every 6 (six) hours as needed for wheezing or shortness of breath. Inhale 2 puff into the lungs every 6 hours as needed for wheezing or shortness of breath  . SYMBICORT 80-4.5 MCG/ACT inhaler INHALE 2 PUFFS FIRST THING IN THE MORNING AND THEN INHALE ANOTHER 2 PUFFS ABOUT 12 HOURS LATER  . triamterene-hydrochlorothiazide (MAXZIDE) 75-50 MG tablet TAKE 1 TABLET BY MOUTH EVERY DAY  . [DISCONTINUED] cyclobenzaprine (FLEXERIL) 10 MG tablet Take 0.5-1 tablets (5-10 mg total) by mouth 2 (two) times daily as needed for muscle spasms. (Patient not taking: Reported on 08/12/2018)  . [DISCONTINUED] Phentermine HCl (LOMAIRA) 8 MG TABS Take 8 mg by mouth daily.  . [DISCONTINUED] topiramate (TOPAMAX) 25 MG tablet Take  25 mg by mouth daily.   No facility-administered encounter medications on file as of 08/12/2018.     Review of Systems:  Review of Systems  Musculoskeletal: Positive for arthralgias, back pain and joint swelling.  All other systems reviewed and are negative.   Health Maintenance  Topic Date Due  . PNEUMOCOCCAL POLYSACCHARIDE VACCINE AGE 38-64  HIGH RISK  02/19/1964  . OPHTHALMOLOGY EXAM  02/19/1972  . TETANUS/TDAP  02/18/1981  . INFLUENZA VACCINE  05/15/2018  . HEMOGLOBIN A1C  07/24/2018  . Hepatitis C Screening  10/15/2018 (Originally 09-15-1961)  . HIV Screening  10/15/2018 (Originally 02/18/1977)  . MAMMOGRAM  11/15/2018  . FOOT EXAM  12/21/2018  . PAP SMEAR  11/16/2019  . COLONOSCOPY  07/29/2024    Physical Exam: Vitals:   08/12/18 1301  BP: 112/62  Pulse: 80  Temp: 98.1 F (36.7 C)  TempSrc: Oral  SpO2: 98%  Weight: 221 lb (100.2 kg)  Height: 5' (1.524 m)   Body mass index is 43.16 kg/m. Physical Exam  Constitutional: She is oriented to person, place, and time. She appears well-developed and well-nourished.    Cardiovascular:  No LE edema b/l. No calf TTP   Musculoskeletal: She exhibits edema (large and small joints) and tenderness.  Standing flexion test on left  SI joint restriction R>L  ASIS TTP right  Pelvic right outflare   Short leg left  Sacral torsion (+)  Paravertebral muscle hypertrophy with ropy tissue texture changes in lumbar, thoracic and cervical muscle hypertrophy  Increased lumbar lordosis  Reduced ROM in lumbar, hip and L>R knee  Neurological: She is alert and oriented to person, place, and time.  Skin: Skin is warm and dry. No rash noted.  Psychiatric: She has a normal mood and affect. Her behavior is normal. Judgment and thought content normal.    Labs reviewed: Basic Metabolic Panel: Recent Labs    01/22/18 0823 03/18/18 1357 04/15/18 1430  NA 140 138 140  K 4.0 3.8 3.8  CL 104 101 104  CO2 30 29 31   GLUCOSE 99 113  91  BUN 14 17 15   CREATININE 0.82 1.07* 0.89  CALCIUM 9.6 10.1 9.8   Liver Function Tests: Recent Labs    01/22/18 0823  ALT 11   No results for input(s): LIPASE, AMYLASE in the last 8760 hours. No results for input(s): AMMONIA in the last 8760 hours. CBC: Recent Labs    03/18/18 1357  WBC 6.7  NEUTROABS 4,013  HGB 11.6*  HCT 38.0  MCV 68.5*  PLT 340   Lipid Panel: Recent Labs    01/22/18 0823  CHOL 159  HDL 56  LDLCALC 90  TRIG 45  CHOLHDL 2.8   Lab Results  Component Value Date   HGBA1C 6.5 (H) 01/22/2018    Procedures since last visit: No results found.  Assessment/Plan   ICD-10-CM   1. Chronic bilateral low back pain with sciatica, sciatica laterality unspecified M54.40 ketorolac (TORADOL) 30 MG/ML injection 30 mg   G89.29   2. Pain in right hip M25.551   3. Morbid obesity due to excess calories (HCC) E66.01   4. Somatic dysfunction of pelvic region M99.05   5. Somatic dysfunction of lumbar region M99.03   6. Somatic dysfunction of lower extremity M99.06   7. Somatic dysfunction of sacral region M99.04   8. Diabetes mellitus without complication (HCC) T61.4 Hemoglobin A1c    Lipid Panel    PROCEDURE NOTE:  After verbal consent obtained, OMT utilized with improvement in ROM, tissue texture, asymmetry and tenderness. Pt tolerated procedure well. OMT Treatment 08/12/2018 07/15/2018 06/17/2018 05/20/2018 04/15/2018 01/28/2018 12/18/2017  Head/Face - - - - - - -  Head/Face Response - - - - - - -  Neck  - - - - MFR;ST;LAS;CS - -  Neck Response - - - - I - -  T1-T4  - - - - - - -  T1-T4 Response - - - - - - -  T-5-T9  - - - - - - -  T5-T9 Response - - - - - - -  Ribs  - - - - - - -  Ribs Response - - - - - - -  Lumbar MFR;ST;IND/INR;DIR MFR;ST;IND/INR;DIR MFR;ST;IND/INR;DIR MFR;ST;IND/INR;DIR MFR;ST;IND/INR;DIR MFR;ST;IND/INR MFR;ST;IND/INR  Lumbar Response I I I I I I I  Sacrum MFR;ST;IND/INR MFR;ST;IND/INR MFR;ST;IND/INR MFR;ST;IND/INR MFR;ST;IND/INR  MFR;ST;IND/INR MFR;ST;IND/INR  Sacrum Response I I I I I I I  Pelvis MFR;ST;LAS;IND/INR;DIR;ME MFR;ST;LAS;IND/INR;DIR;ME MFR;ST;LAS;IND/INR;DIR;ME MFR;ST;LAS;IND/INR;DIR MFR;ST;LAS;IND/INR;DIR MFR;ST;LAS;IND/INR;DIR MFR;ST;LAS;IND/INR;DIR  Pelvis Response I I I I I I I  Upper Ext. - - - - - - I  Upper Ext. Response - - - - - - LAS;MFR;ST  Lower Ext. MFR;ST;LAS;IND/INR MFR;ST;LAS;IND/INR MFR;ST;LAS;IND/INR MFR;ST;LAS;IND/INR MFR;ST;LAS;IND/INR MFR;ST;LAS;IND/INR MFR;ST;LAS;IND/INR  Lower Ext. Response I I I I I I I   Will call with lab results  Toradol 60mg  injection given today  Follow up with [facility] as scheduled  Continue current medications as ordered  Push fluids and rest today. No heavy lifting. May resume nml activity in the AM.  Follow up in 3 mos with new PCP, Wilfred Lacy, NP-C at [facility].     Fransico Sciandra S. Perlie Gold  [facility] [address] [place], [place] [address] [phone] Cell (Monday-Friday 8 AM - 5 PM) [phone] After 5 PM and follow prompts

## 2018-08-12 NOTE — Patient Instructions (Addendum)
Will call with lab results  Follow up with [facility] as scheduled  Continue current medications as ordered  Push fluids and rest today. No heavy lifting. May resume nml activity in the AM.  Follow up in 3 mos with new PCP, Wilfred Lacy, NP-C at [facility].

## 2018-08-13 LAB — HEMOGLOBIN A1C
EAG (MMOL/L): 7.7 (calc)
HEMOGLOBIN A1C: 6.5 %{Hb} — AB (ref <5.7)
Mean Plasma Glucose: 140 (calc)

## 2018-08-13 LAB — LIPID PANEL
CHOL/HDL RATIO: 2.8 (calc) (ref <5.0)
CHOLESTEROL: 187 mg/dL (ref <200)
HDL: 66 mg/dL (ref >50)
LDL CHOLESTEROL (CALC): 106 mg/dL — AB
Non-HDL Cholesterol (Calc): 121 mg/dL (calc) (ref <130)
Triglycerides: 61 mg/dL (ref <150)

## 2018-10-29 ENCOUNTER — Encounter: Payer: Self-pay | Admitting: Nurse Practitioner

## 2018-10-29 ENCOUNTER — Ambulatory Visit (INDEPENDENT_AMBULATORY_CARE_PROVIDER_SITE_OTHER): Payer: BC Managed Care – PPO | Admitting: Nurse Practitioner

## 2018-10-29 VITALS — BP 120/70 | HR 76 | Temp 98.1°F | Ht 60.0 in | Wt 227.0 lb

## 2018-10-29 DIAGNOSIS — R600 Localized edema: Secondary | ICD-10-CM

## 2018-10-29 DIAGNOSIS — D509 Iron deficiency anemia, unspecified: Secondary | ICD-10-CM

## 2018-10-29 DIAGNOSIS — R0609 Other forms of dyspnea: Secondary | ICD-10-CM

## 2018-10-29 DIAGNOSIS — I10 Essential (primary) hypertension: Secondary | ICD-10-CM

## 2018-10-29 DIAGNOSIS — Z8249 Family history of ischemic heart disease and other diseases of the circulatory system: Secondary | ICD-10-CM

## 2018-10-29 DIAGNOSIS — M791 Myalgia, unspecified site: Secondary | ICD-10-CM | POA: Diagnosis not present

## 2018-10-29 LAB — BASIC METABOLIC PANEL
BUN: 17 mg/dL (ref 6–23)
CO2: 29 mEq/L (ref 19–32)
Calcium: 9.9 mg/dL (ref 8.4–10.5)
Chloride: 104 mEq/L (ref 96–112)
Creatinine, Ser: 0.84 mg/dL (ref 0.40–1.20)
GFR: 89.97 mL/min (ref >60.00)
Glucose, Bld: 97 mg/dL (ref 70–99)
Potassium: 4.1 mEq/L (ref 3.5–5.1)
Sodium: 140 mEq/L (ref 135–145)

## 2018-10-29 NOTE — Patient Instructions (Signed)
Go to lab for blood draw.  You will be contacted to schedule stress test.  Continue with diet and exercise.

## 2018-10-29 NOTE — Progress Notes (Signed)
Subjective:  Patient ID: Mackenzie Weber, female    DOB: 1961/05/12  Age: 58 y.o. MRN: [id number]  CC: Establish Care (est care,weight loss consult/pt is doing light exercise and eatting good at times. )  Shortness of Breath  This is a chronic problem. The current episode started more than 1 year ago. The problem occurs intermittently. The problem has been waxing and waning. Associated symptoms include leg swelling. Pertinent negatives include no chest pain, orthopnea, PND, sputum production or wheezing. Exacerbated by: exertion. Risk factors include oral contraceptive. She has tried beta agonist inhalers and steroid inhalers for the symptoms. The treatment provided moderate relief. Her past medical history is significant for asthma. There is no history of CAD, DVT or a heart failure.   Obesity: BMI of 44 Current patient of [facility] weight loss clinic. She considered bariatric surgery (sleeve), will like it done by 01/2019 Current use of metformin, phentermine and topamax Still needs to make appt with nutritionist  exercise on hold for now due to recent URI, typically walks daily. Weight down to 215lbs with topoamax and phentermine x 48months Heaviest weight 230lbs Lowest weight 210lbs in past 12yrs. Wt Readings from Last 3 Encounters:  10/29/18 227 lb (103 kg)  08/12/18 221 lb (100.2 kg)  07/15/18 222 lb 9.6 oz (101 kg)   GYN: last seen 12/2017, with [place] [facility]: Dr. Earnstine Weber S/p hysterectomy.  HTN: Controlled with hyzaar 100/12.5, and maxzide 75/25mg . Chronic LE edema (improves with elevation and walking, worse with prolong sitting) BP Readings from Last 3 Encounters:  10/29/18 120/70  08/12/18 112/62  07/15/18 136/78    GERD: Stable with Nexium and sucralfate. Managed by Dr. Dorrene Weber with [facility]. Last colonoscopy 2015 (colon polyp, benign)   Asthma: Stable with symbicort, albiterol, and singulair. PFT done 2016.  Reviewed past Medical, Social and Family history  today.  Outpatient Medications Prior to Visit  Medication Sig Dispense Refill  . albuterol (PROVENTIL HFA;VENTOLIN HFA) 108 (90 Base) MCG/ACT inhaler albuterol sulfate HFA 90 mcg/actuation aerosol inhaler    . budesonide-formoterol (SYMBICORT) 80-4.5 MCG/ACT inhaler INHALE 2 PUFFS FIRST THING IN THE MORNING AND THEN INHALE ANOTHER 2 PUFFS ABOUT 12 HOURS LATER 10.2 g 5  . Digestive Enzymes (DIGESTIVE ENZYME PO) Take 2 (two) times daily by mouth.    . EPINEPHrine (EPIPEN JR) 0.15 MG/0.3ML injection Inject 0.15 mg into the muscle daily as needed for anaphylaxis.     Marland Kitchen esomeprazole (NEXIUM) 40 MG capsule Take 1 capsule (40 mg total) by mouth 2 (two) times daily before a meal. 180 capsule 4  . estradiol (ESTRACE) 0.1 MG/GM vaginal cream Place 4.74 Applicatorfuls vaginally daily. 42.5 g 11  . losartan-hydrochlorothiazide (HYZAAR) 100-12.5 MG tablet TAKE 1 TABLET BY MOUTH EVERY DAY 90 tablet 1  . meloxicam (MOBIC) 7.5 MG tablet Take 1 tablet (7.5 mg total) by mouth daily. May take up to 2 tabs po daily prn severe pain 60 tablet 6  . meloxicam (MOBIC) 7.5 MG tablet meloxicam 7.5 mg tablet    . metFORMIN (GLUCOPHAGE) 500 MG tablet Take 0.5 tablets (250 mg total) by mouth 2 (two) times daily with a meal. 30 tablet 6  . montelukast (SINGULAIR) 10 MG tablet Take 1 tablet (10 mg total) by mouth at bedtime. FOR SEASONAL ALLERGY 30 tablet 6  . Multiple Vitamin (MULTIVITAMIN) capsule Take 1 capsule by mouth daily.    Marland Kitchen PROAIR HFA 108 (90 Base) MCG/ACT inhaler Inhale 2 puffs into the lungs every 6 (six) hours as  needed for wheezing or shortness of breath. Inhale 2 puff into the lungs every 6 hours as needed for wheezing or shortness of breath 18 g 5  . sucralfate (CARAFATE) 1 g tablet sucralfate 1 gram tablet    . topiramate (TOPAMAX) 25 MG tablet topiramate 25 mg tablet    . triamterene-hydrochlorothiazide (MAXZIDE) 75-50 MG tablet Take 1 tablet by mouth daily. 90 tablet 1  . losartan-hydrochlorothiazide  (HYZAAR) 100-12.5 MG tablet losartan 100 mg-hydrochlorothiazide 12.5 mg tablet    . Multiple Vitamins-Minerals (MULTIVITAMIN ADULT EXTRA C) CHEW Chew by mouth.    . Phentermine HCl (LOMAIRA) 8 MG TABS Lomaira 8 mg tablet  TK 1 T PO AT NOON     No facility-administered medications prior to visit.    ROS See HPI  Objective:  BP 120/70   Pulse 76   Temp 98.1 F (36.7 C) (Oral)   Ht 5' (1.524 m)   Wt 227 lb (103 kg)   SpO2 98%   BMI 44.33 kg/m   BP Readings from Last 3 Encounters:  10/29/18 120/70  08/12/18 112/62  07/15/18 136/78    Wt Readings from Last 3 Encounters:  10/29/18 227 lb (103 kg)  08/12/18 221 lb (100.2 kg)  07/15/18 222 lb 9.6 oz (101 kg)    Physical Exam Vitals signs reviewed.  Neck:     Musculoskeletal: Normal range of motion and neck supple.  Cardiovascular:     Rate and Rhythm: Normal rate and regular rhythm.     Pulses: Normal pulses.     Heart sounds: Normal heart sounds.  Pulmonary:     Effort: Pulmonary effort is normal.     Breath sounds: Normal breath sounds.  Musculoskeletal:     Right lower leg: Edema present.     Left lower leg: Edema present.  Neurological:     Mental Status: She is alert and oriented to person, place, and time.    Lab Results  Component Value Date   WBC 6.7 03/18/2018   HGB 11.6 (L) 03/18/2018   HCT 38.0 03/18/2018   PLT 340 03/18/2018   GLUCOSE 91 04/15/2018   CHOL 187 08/12/2018   TRIG 61 08/12/2018   HDL 66 08/12/2018   LDLCALC 106 (H) 08/12/2018   ALT 11 01/22/2018   AST 14 04/23/2017   NA 140 04/15/2018   K 3.8 04/15/2018   CL 104 04/15/2018   CREATININE 0.89 04/15/2018   BUN 15 04/15/2018   CO2 31 04/15/2018   HGBA1C 6.5 (H) 08/12/2018   MICROALBUR 0.9 01/22/2018    Assessment & Plan:   I cautioned Mackenzie.Weber possible side effects from prolonged use of appetite suppressants.  Mackenzie Weber was seen today for establish care.  Diagnoses and all orders for this visit:  Essential hypertension -      Basic metabolic panel  Bilateral lower extremity edema -     Basic metabolic panel  Myalgia -     Vitamin D 1,25 dihydroxy  Iron deficiency anemia, unspecified iron deficiency anemia type -     Iron, TIBC and Ferritin Panel  Dyspnea on exertion -     Exercise Tolerance Test; Future  Family history of premature CAD -     Exercise Tolerance Test; Future   I am having Mackenzie Weber maintain her EPINEPHrine, estradiol, multivitamin, Digestive Enzymes (DIGESTIVE ENZYME PO), esomeprazole, PROAIR HFA, montelukast, losartan-hydrochlorothiazide, meloxicam, budesonide-formoterol, metFORMIN, triamterene-hydrochlorothiazide, MULTIVITAMIN ADULT EXTRA C, topiramate, sucralfate, albuterol, losartan-hydrochlorothiazide, meloxicam, and Phentermine HCl.  No orders of the defined  types were placed in this encounter.   Problem List Items Addressed This Visit      Cardiovascular and Mediastinum   Essential hypertension - Primary   Relevant Medications   losartan-hydrochlorothiazide (HYZAAR) 100-12.5 MG tablet   Other Relevant Orders   Basic metabolic panel     Other   Bilateral lower extremity edema   Relevant Orders   Basic metabolic panel    Other Visit Diagnoses    Myalgia       Relevant Orders   Vitamin D 1,25 dihydroxy   Iron deficiency anemia, unspecified iron deficiency anemia type       Relevant Orders   Iron, TIBC and Ferritin Panel   Dyspnea on exertion       Relevant Orders   Exercise Tolerance Test   Family history of premature CAD       Relevant Orders   Exercise Tolerance Test      Follow-up: Return in about 2 months (around 12/29/2018) for CPE (fasting).  Wilfred Lacy, NP

## 2018-11-02 LAB — IRON,TIBC AND FERRITIN PANEL
%SAT: 20 % (calc) (ref 16–45)
Ferritin: 45 ng/mL (ref 16–232)
Iron: 53 ug/dL (ref 45–160)
TIBC: 262 mcg/dL (calc) (ref 250–450)

## 2018-11-02 LAB — VITAMIN D 1,25 DIHYDROXY
Vitamin D 1, 25 (OH)2 Total: 48 pg/mL (ref 18–72)
Vitamin D2 1, 25 (OH)2: <8 pg/mL
Vitamin D3 1, 25 (OH)2: 48 pg/mL

## 2018-11-06 ENCOUNTER — Ambulatory Visit (INDEPENDENT_AMBULATORY_CARE_PROVIDER_SITE_OTHER): Payer: BC Managed Care – PPO

## 2018-11-06 DIAGNOSIS — R0609 Other forms of dyspnea: Secondary | ICD-10-CM | POA: Diagnosis not present

## 2018-11-06 DIAGNOSIS — Z8249 Family history of ischemic heart disease and other diseases of the circulatory system: Secondary | ICD-10-CM

## 2018-11-06 LAB — EXERCISE TOLERANCE TEST
CSEPPHR: 142 {beats}/min
Estimated workload: 7.2 METS
Exercise duration (min): 4 min
Exercise duration (sec): 7 s
MPHR: 164 {beats}/min
Percent HR: 86 %
RPE: 19
Rest HR: 69 {beats}/min

## 2018-11-10 ENCOUNTER — Encounter: Payer: Self-pay | Admitting: Nurse Practitioner

## 2018-11-13 LAB — HM MAMMOGRAPHY

## 2018-11-20 ENCOUNTER — Other Ambulatory Visit: Payer: Self-pay | Admitting: Surgery

## 2018-12-29 ENCOUNTER — Encounter: Payer: BC Managed Care – PPO | Admitting: Nurse Practitioner

## 2019-01-01 ENCOUNTER — Other Ambulatory Visit: Payer: Self-pay

## 2019-01-01 ENCOUNTER — Encounter: Payer: Self-pay | Admitting: Nurse Practitioner

## 2019-01-01 ENCOUNTER — Ambulatory Visit (INDEPENDENT_AMBULATORY_CARE_PROVIDER_SITE_OTHER): Payer: BC Managed Care – PPO | Admitting: Nurse Practitioner

## 2019-01-01 VITALS — BP 114/70 | HR 69 | Temp 98.5°F | Ht 60.0 in | Wt 231.2 lb

## 2019-01-01 DIAGNOSIS — I10 Essential (primary) hypertension: Secondary | ICD-10-CM

## 2019-01-01 DIAGNOSIS — E119 Type 2 diabetes mellitus without complications: Secondary | ICD-10-CM

## 2019-01-01 DIAGNOSIS — J302 Other seasonal allergic rhinitis: Secondary | ICD-10-CM

## 2019-01-01 DIAGNOSIS — Z136 Encounter for screening for cardiovascular disorders: Secondary | ICD-10-CM

## 2019-01-01 DIAGNOSIS — E1165 Type 2 diabetes mellitus with hyperglycemia: Secondary | ICD-10-CM | POA: Insufficient documentation

## 2019-01-01 DIAGNOSIS — Z0001 Encounter for general adult medical examination with abnormal findings: Secondary | ICD-10-CM | POA: Diagnosis not present

## 2019-01-01 DIAGNOSIS — Z13220 Encounter for screening for lipoid disorders: Secondary | ICD-10-CM | POA: Diagnosis not present

## 2019-01-01 LAB — LIPID PANEL
CHOLESTEROL: 165 mg/dL (ref 0–200)
HDL: 53.3 mg/dL (ref >39.00)
LDL Cholesterol: 100 mg/dL — ABNORMAL HIGH (ref 0–99)
NonHDL: 112.16
Total CHOL/HDL Ratio: 3
Triglycerides: 63 mg/dL (ref 0.0–149.0)
VLDL: 12.6 mg/dL (ref 0.0–40.0)

## 2019-01-01 LAB — COMPREHENSIVE METABOLIC PANEL
ALT: 11 U/L (ref 0–35)
AST: 13 U/L (ref 0–37)
Albumin: 4 g/dL (ref 3.5–5.2)
Alkaline Phosphatase: 61 U/L (ref 39–117)
BILIRUBIN TOTAL: 0.2 mg/dL (ref 0.2–1.2)
BUN: 17 mg/dL (ref 6–23)
CO2: 29 mEq/L (ref 19–32)
Calcium: 9.7 mg/dL (ref 8.4–10.5)
Chloride: 104 mEq/L (ref 96–112)
Creatinine, Ser: 0.91 mg/dL (ref 0.40–1.20)
GFR: 77.14 mL/min (ref >60.00)
Glucose, Bld: 95 mg/dL (ref 70–99)
Potassium: 4 mEq/L (ref 3.5–5.1)
Sodium: 139 mEq/L (ref 135–145)
Total Protein: 6.8 g/dL (ref 6.0–8.3)

## 2019-01-01 LAB — MICROALBUMIN / CREATININE URINE RATIO
Creatinine,U: 78.7 mg/dL
Microalb Creat Ratio: 0.9 mg/g (ref 0.0–30.0)
Microalb, Ur: <0.7 mg/dL (ref 0.0–1.9)

## 2019-01-01 LAB — TSH: TSH: 2.84 u[IU]/mL (ref 0.35–4.50)

## 2019-01-01 LAB — HEMOGLOBIN A1C: Hgb A1c MFr Bld: 7 % — ABNORMAL HIGH (ref 4.6–6.5)

## 2019-01-01 MED ORDER — LOSARTAN POTASSIUM-HCTZ 100-12.5 MG PO TABS: 1.0000 | ORAL_TABLET | Freq: Every day | ORAL | 3 refills | Status: DC

## 2019-01-01 MED ORDER — MONTELUKAST SODIUM 10 MG PO TABS: 10.0000 mg | ORAL_TABLET | Freq: Every day | ORAL | 3 refills | Status: DC

## 2019-01-01 MED ORDER — METFORMIN HCL 500 MG PO TABS: 500.0000 mg | ORAL_TABLET | Freq: Two times a day (BID) | ORAL | 5 refills | Status: DC

## 2019-01-01 NOTE — Patient Instructions (Addendum)
Normal urine microalbumin Increase in hgbA1c 6.5 to 7.0. need to resume metformin 500mg  BID with food. New rx sent. Normal TSH, lipid panel, and cmp F/up in 31months  Exercising to Lose Weight Exercise is structured, repetitive physical activity to improve fitness and health. Getting regular exercise is important for everyone. It is especially important if you are overweight. Being overweight increases your risk of heart disease, stroke, diabetes, high blood pressure, and several types of cancer. Reducing your calorie intake and exercising can help you lose weight. Exercise is usually categorized as moderate or vigorous intensity. To lose weight, most people need to do a certain amount of moderate-intensity or vigorous-intensity exercise each week. Moderate-intensity exercise  Moderate-intensity exercise is any activity that gets you moving enough to burn at least three times more energy (calories) than if you were sitting. Examples of moderate exercise include:  Walking a mile in 15 minutes.  Doing light yard work.  Biking at an easy pace. Most people should get at least 150 minutes (2 hours and 30 minutes) a week of moderate-intensity exercise to maintain their body weight. Vigorous-intensity exercise Vigorous-intensity exercise is any activity that gets you moving enough to burn at least six times more calories than if you were sitting. When you exercise at this intensity, you should be working hard enough that you are not able to carry on a conversation. Examples of vigorous exercise include:  Running.  Playing a team sport, such as football, basketball, and soccer.  Jumping rope. Most people should get at least 75 minutes (1 hour and 15 minutes) a week of vigorous-intensity exercise to maintain their body weight. How can exercise affect me? When you exercise enough to burn more calories than you eat, you lose weight. Exercise also reduces body fat and builds muscle. The more muscle  you have, the more calories you burn. Exercise also:  Improves mood.  Reduces stress and tension.  Improves your overall fitness, flexibility, and endurance.  Increases bone strength. The amount of exercise you need to lose weight depends on:  Your age.  The type of exercise.  Any health conditions you have.  Your overall physical ability. Talk to your health care provider about how much exercise you need and what types of activities are safe for you. What actions can I take to lose weight? Nutrition   Make changes to your diet as told by your health care provider or diet and nutrition specialist (dietitian). This may include: ? Eating fewer calories. ? Eating more protein. ? Eating less unhealthy fats. ? Eating a diet that includes fresh fruits and vegetables, whole grains, low-fat dairy products, and lean protein. ? Avoiding foods with added fat, salt, and sugar.  Drink plenty of water while you exercise to prevent dehydration or heat stroke. Activity  Choose an activity that you enjoy and set realistic goals. Your health care provider can help you make an exercise plan that works for you.  Exercise at a moderate or vigorous intensity most days of the week. ? The intensity of exercise may vary from person to person. You can tell how intense a workout is for you by paying attention to your breathing and heartbeat. Most people will notice their breathing and heartbeat get faster with more intense exercise.  Do resistance training twice each week, such as: ? Push-ups. ? Sit-ups. ? Lifting weights. ? Using resistance bands.  Getting short amounts of exercise can be just as helpful as long structured periods of exercise. If you have  trouble finding time to exercise, try to include exercise in your daily routine. ? Get up, stretch, and walk around every 30 minutes throughout the day. ? Go for a walk during your lunch break. ? Park your car farther away from your  destination. ? If you take public transportation, get off one stop early and walk the rest of the way. ? Make phone calls while standing up and walking around. ? Take the stairs instead of elevators or escalators.  Wear comfortable clothes and shoes with good support.  Do not exercise so much that you hurt yourself, feel dizzy, or get very short of breath. Where to find more information  [place] Department of Health and Human Services: [employer]at  Centers for Disease Control and Prevention (CDC): http://www.wolf.info/ Contact a health care provider:  Before starting a new exercise program.  If you have questions or concerns about your weight.  If you have a medical problem that keeps you from exercising. Get help right away if you have any of the following while exercising:  Injury.  Dizziness.  Difficulty breathing or shortness of breath that does not go away when you stop exercising.  Chest pain.  Rapid heartbeat. Summary  Being overweight increases your risk of heart disease, stroke, diabetes, high blood pressure, and several types of cancer.  Losing weight happens when you burn more calories than you eat.  Reducing the amount of calories you eat in addition to getting regular moderate or vigorous exercise each week helps you lose weight. This information is not intended to replace advice given to you by your health care provider. Make sure you discuss any questions you have with your health care provider. Document Released: 11/03/2010 Document Revised: 10/14/2017 Document Reviewed: 10/14/2017 Elsevier Interactive Patient Education  2019 [employer].

## 2019-01-01 NOTE — Progress Notes (Signed)
Subjective:    Patient ID: Mackenzie Weber, female    DOB: 1961/08/20, 58 y.o.   MRN: [id number]  Patient presents today for complete physical and re eval of HTN and DM  HPI  HTN: Controlled with losartan HCTZ Intermittent LE edema, improves with elevation BP Readings from Last 3 Encounters:  01/01/19 114/70  10/29/18 120/70  08/12/18 112/62   DM: Last hgbA1c of 6.5 No longer taking metformin  Asthma: Controlled, triggers: stress, change in weather, exercise Use of albuterol prn. Use of symbicort twice a week.  Sexual History (orientation,birth control, marital status, STD):up to date with PAP and mammogram, done by [facility] [place].  Depression/Suicide: Depression screen [facility] 2/9 10/29/2018 12/20/2017 10/22/2017 04/23/2017 04/20/2016 02/17/2016 04/13/2015  Decreased Interest 0 0 0 0 0 0 0  Down, Depressed, Hopeless 0 0 0 0 0 0 0  PHQ - 2 Score 0 0 0 0 0 0 0   Vision:up to date, done annually, use of corrective lens  Dental:up to date, done every 79months  Immunizations: (TDAP, Hep C screen, Pneumovax, Influenza, zoster)  Health Maintenance  Topic Date Due  . Eye exam for diabetics  02/19/1972  . Mammogram  11/15/2018  . Complete foot exam   12/21/2018  . Flu Shot  01/13/2019*  . Pneumococcal vaccine  01/01/2020*  .  Hepatitis C: One time screening is recommended by Center for Disease Control  (CDC) for  adults born from [birth year] through 1964.   01/01/2020*  . HIV Screening  01/01/2020*  . Hemoglobin A1C  02/11/2019  . Pap Smear  11/16/2019  . Colon Cancer Screening  07/29/2024  . Tetanus Vaccine  02/13/2027  *Topic was postponed. The date shown is not the original due date.   Diet:regular.  Weight:  Wt Readings from Last 3 Encounters:  01/01/19 231 lb 3.2 oz (104.9 kg)  10/29/18 227 lb (103 kg)  08/12/18 221 lb (100.2 kg)    Exercise:none  Fall Risk: Fall Risk  10/29/2018 07/15/2018 06/17/2018 05/20/2018 04/15/2018 03/18/2018 01/28/2018  Falls in the past year? 0 No No  No No No No   Advanced Directive: Advanced Directives 11/19/2017  Does Patient Have a Medical Advance Directive? No  Would patient like information on creating a medical advance directive? No - Patient declined     Medications and allergies reviewed with patient and updated if appropriate.  Patient Active Problem List   Diagnosis Date Noted  . Type 2 diabetes mellitus without complication, without long-term current use of insulin ([place]) 01/01/2019  . Seasonal allergies 05/20/2018  . Chronic bilateral low back pain with left-sided sciatica 05/20/2018  . Lipoma of left upper extremity 01/29/2018  . Muscle spasm of left lower extremity 01/29/2018  . Intestinal ulcer 04/23/2017  . Bilateral lower extremity edema 04/23/2017  . Somatic dysfunction of cervical region 11/16/2015  . Chronic sciatica 01/28/2015  . Somatic dysfunction of pelvic region 01/28/2015  . Somatic dysfunction of lumbar region 01/28/2015  . Somatic dysfunction of lower extremity 01/28/2015  . Somatic dysfunction of sacral region 01/28/2015  . Mild persistent asthma with allergic rhinitis   10/22/2014  . Essential hypertension 10/22/2014  . Morbid obesity due to excess calories ([place]) 10/22/2014  . Abdominal muscle strain 06/07/2014  . Sciatica of left side 11/03/2012    Current Outpatient Medications on File Prior to Visit  Medication Sig Dispense Refill  . budesonide-formoterol (SYMBICORT) 80-4.5 MCG/ACT inhaler INHALE 2 PUFFS FIRST THING IN THE MORNING AND THEN INHALE ANOTHER 2 PUFFS ABOUT  12 HOURS LATER 10.2 g 5  . EPINEPHrine (EPIPEN JR) 0.15 MG/0.3ML injection Inject 0.15 mg into the muscle daily as needed for anaphylaxis.     Marland Kitchen esomeprazole (NEXIUM) 40 MG capsule Take 1 capsule (40 mg total) by mouth 2 (two) times daily before a meal. 180 capsule 4  . estradiol (ESTRACE) 0.1 MG/GM vaginal cream Place 9.56 Applicatorfuls vaginally daily. 42.5 g 11  . meloxicam (MOBIC) 7.5 MG tablet Take 1 tablet (7.5 mg total) by  mouth daily. May take up to 2 tabs po daily prn severe pain 60 tablet 6  . Multiple Vitamin (MULTIVITAMIN) capsule Take 1 capsule by mouth daily.    Marland Kitchen PROAIR HFA 108 (90 Base) MCG/ACT inhaler Inhale 2 puffs into the lungs every 6 (six) hours as needed for wheezing or shortness of breath. Inhale 2 puff into the lungs every 6 hours as needed for wheezing or shortness of breath 18 g 5  . sucralfate (CARAFATE) 1 g tablet sucralfate 1 gram tablet     No current facility-administered medications on file prior to visit.     Past Medical History:  Diagnosis Date  . Anemia   . Asthma   . Diabetes mellitus without complication ([place])   . Foot pain, right   . Hypertension   . IBS (irritable bowel syndrome)   . Sciatica of left side   . Seasonal allergies     Past Surgical History:  Procedure Laterality Date  . ABDOMINAL HYSTERECTOMY    . APPENDECTOMY    . BLADDER SUSPENSION  2012   TVT  . BREAST SURGERY  2001   /biopsy-benign  . CHOLECYSTECTOMY    . EXCISIONAL HEMORRHOIDECTOMY    . KNEE SURGERY     right  . SHOULDER SURGERY Left 02/13/2018   Lipoma removed   . Small Bowel Surgery    . TONSILLECTOMY    . TOTAL ABDOMINAL HYSTERECTOMY W/ BILATERAL SALPINGOOPHORECTOMY  1989   LSO-[id number]; [id number]  . WRIST SURGERY     carpal tunnel repair    Social History   Socioeconomic History  . Marital status: Married    Spouse name: Not on file  . Number of children: Not on file  . Years of education: Not on file  . Highest education level: Not on file  Occupational History  . Occupation: retired in 2015 from [employer].  Social Needs  . Financial resource strain: Not on file  . Food insecurity:    Worry: Not on file    Inability: Not on file  . Transportation needs:    Medical: Not on file    Non-medical: Not on file  Tobacco Use  . Smoking status: Never Smoker  . Smokeless tobacco: Never Used  Substance and Sexual Activity  . Alcohol use: No  . Drug use: No  . Sexual  activity: Yes    Partners: Male    Birth control/protection: Surgical  Lifestyle  . Physical activity:    Days per week: Not on file    Minutes per session: Not on file  . Stress: Not on file  Relationships  . Social connections:    Talks on phone: Not on file    Gets together: Not on file    Attends religious service: Not on file    Active member of club or organization: Not on file    Attends meetings of clubs or organizations: Not on file    Relationship status: Not on file  Other Topics  Concern  . Not on file  Social History Narrative  . Not on file    Family History  Problem Relation Age of Onset  . Diabetes Father   . Hypertension Father   . Heart disease Father   . Diabetes Mother   . Hypertension Mother   . Asthma Mother   . Heart disease Mother   . Diabetes Brother   . Hypertension Sister   . Diabetes Sister   . Cancer Sister 89       breast  . Hypertension Paternal Uncle         Review of Systems  Constitutional: Negative for fever, malaise/fatigue and weight loss.  HENT: Negative for congestion and sore throat.   Eyes:       Negative for visual changes  Respiratory: Negative for cough and shortness of breath.   Cardiovascular: Negative for chest pain, palpitations and leg swelling.  Gastrointestinal: Negative for blood in stool, constipation, diarrhea and heartburn.  Genitourinary: Negative for dysuria, frequency and urgency.  Musculoskeletal: Negative for falls, joint pain and myalgias.  Skin: Negative for rash.  Neurological: Negative for dizziness, sensory change and headaches.  Endo/Heme/Allergies: Does not bruise/bleed easily.  Psychiatric/Behavioral: Negative for depression, substance abuse and suicidal ideas. The patient is not nervous/anxious.     Objective:   Vitals:   01/01/19 0814  BP: 114/70  Pulse: 69  Temp: 98.5 F (36.9 C)  SpO2: 98%    Body mass index is 45.15 kg/m.   Physical Examination:  Physical Exam Vitals  signs reviewed.  Constitutional:      Appearance: She is obese.  HENT:     Right Ear: Tympanic membrane, ear canal and external ear normal.     Left Ear: Tympanic membrane, ear canal and external ear normal.     Nose: Nose normal.     Mouth/Throat:     Mouth: Mucous membranes are moist.  Eyes:     Extraocular Movements: Extraocular movements intact.     Conjunctiva/sclera: Conjunctivae normal.     Pupils: Pupils are equal, round, and reactive to light.  Neck:     Musculoskeletal: Normal range of motion and neck supple.     Thyroid: No thyroid mass, thyromegaly or thyroid tenderness.  Cardiovascular:     Rate and Rhythm: Normal rate and regular rhythm.     Pulses: Normal pulses.     Heart sounds: Normal heart sounds.  Pulmonary:     Effort: Pulmonary effort is normal.     Breath sounds: Normal breath sounds.  Abdominal:     General: There is no distension.     Tenderness: There is no abdominal tenderness.  Genitourinary:    Comments: Deferred breast and pelvic exam to GYN Musculoskeletal:     Right lower leg: No edema.     Left lower leg: Edema present.  Lymphadenopathy:     Cervical: No cervical adenopathy.  Skin:    Findings: No erythema or rash.     Comments: Normal diabetic foot exam  Neurological:     Mental Status: She is alert and oriented to person, place, and time.  Psychiatric:        Mood and Affect: Mood normal.        Behavior: Behavior normal.        Thought Content: Thought content normal.     ASSESSMENT and PLAN:  Mackenzie Weber was seen today for annual exam.  Diagnoses and all orders for this visit:  Encounter for preventative adult health  care exam with abnormal findings -     Comprehensive metabolic panel -     TSH  Essential hypertension -     Amb Ref to Medical Weight Management -     losartan-hydrochlorothiazide (HYZAAR) 100-12.5 MG tablet; Take 1 tablet by mouth daily.  Morbid obesity due to excess calories (HCC) -     Lipid panel -     Amb  Ref to Medical Weight Management  Encounter for lipid screening for cardiovascular disease -     Lipid panel  Type 2 diabetes mellitus without complication, without long-term current use of insulin (HCC) -     Hemoglobin A1c -     Microalbumin / creatinine urine ratio -     Amb Ref to Medical Weight Management -     metFORMIN (GLUCOPHAGE) 500 MG tablet; Take 1 tablet (500 mg total) by mouth 2 (two) times daily with a meal.  Seasonal allergies -     montelukast (SINGULAIR) 10 MG tablet; Take 1 tablet (10 mg total) by mouth at bedtime. FOR SEASONAL ALLERGY    Morbid obesity due to excess calories (HCC) Use of metformin, topoamax and phentermine x 71months. Heaviest weight 230lbs Lowest weight 210lbs in past 19yrs.  Admits to stress eating. Has difficulty making recommended diet. Has difficulty with regular exercise due to chronic knee and back pain.  Spent about 40mins discussing the importance of weight loss through diet and exercise. We also talked about her risk for CVD and CAD due to age, gender, DM, and HTN. She verbalized understanding and opted to go to weight loss clinic with [facility]. Referral entered. Current BMI 45.      Problem List Items Addressed This Visit      Cardiovascular and Mediastinum   Essential hypertension   Relevant Medications   losartan-hydrochlorothiazide (HYZAAR) 100-12.5 MG tablet   Other Relevant Orders   Amb Ref to Medical Weight Management     Endocrine   Type 2 diabetes mellitus without complication, without long-term current use of insulin (HCC)   Relevant Medications   metFORMIN (GLUCOPHAGE) 500 MG tablet   losartan-hydrochlorothiazide (HYZAAR) 100-12.5 MG tablet   Other Relevant Orders   Hemoglobin A1c (Completed)   Microalbumin / creatinine urine ratio (Completed)   Amb Ref to Medical Weight Management     Other   Morbid obesity due to excess calories (HCC)    Use of metformin, topoamax and phentermine x 75months. Heaviest weight  230lbs Lowest weight 210lbs in past 19yrs.  Admits to stress eating. Has difficulty making recommended diet. Has difficulty with regular exercise due to chronic knee and back pain.  Spent about 53mins discussing the importance of weight loss through diet and exercise. We also talked about her risk for CVD and CAD due to age, gender, DM, and HTN. She verbalized understanding and opted to go to weight loss clinic with [facility]. Referral entered. Current BMI 45.      Relevant Medications   metFORMIN (GLUCOPHAGE) 500 MG tablet   Other Relevant Orders   Lipid panel (Completed)   Amb Ref to Medical Weight Management   Seasonal allergies   Relevant Medications   montelukast (SINGULAIR) 10 MG tablet    Other Visit Diagnoses    Encounter for preventative adult health care exam with abnormal findings    -  Primary   Relevant Orders   Comprehensive metabolic panel (Completed)   TSH (Completed)   Encounter for lipid screening for cardiovascular disease  Relevant Orders   Lipid panel (Completed)       Follow up: Return in about 3 months (around 04/03/2019) for DM and HTN.  Wilfred Lacy, NP

## 2019-01-01 NOTE — Assessment & Plan Note (Addendum)
Use of metformin, topoamax and phentermine x 25months. Heaviest weight 230lbs Lowest weight 210lbs in past 70yrs.  Admits to stress eating. Has difficulty making recommended diet. Has difficulty with regular exercise due to chronic knee and back pain.  Spent about 58mins discussing the importance of weight loss through diet and exercise. We also talked about her risk for CVD and CAD due to age, gender, DM, and HTN. She verbalized understanding and opted to go to weight loss clinic with [facility]. Referral entered. Current BMI 45.

## 2019-01-29 ENCOUNTER — Telehealth: Payer: Self-pay | Admitting: Nurse Practitioner

## 2019-01-29 NOTE — Telephone Encounter (Signed)
Pt stated our office call and want the number and address for Heather to send the referral to. Please check and help the pt.    Copied from [place] [id number]. Topic: General - Inquiry >> Jan 29, 2019  2:34 PM Virl Axe D wrote: Reason for CRM: Pt left vm regarding a referral being sent to [facility] at [place], [place]. Please advise. [phone] Please reach to pt for more information if needed. CB#[phone]

## 2019-01-30 NOTE — Telephone Encounter (Signed)
Referral faxed

## 2019-01-30 NOTE — Telephone Encounter (Signed)
Mackenzie Weber -see message below

## 2019-03-11 LAB — HEPATIC FUNCTION PANEL
ALT: 10 (ref 7–35)
AST: 14 (ref 13–35)
Alkaline Phosphatase: 67 (ref 25–125)
Bilirubin, Total: 0.2

## 2019-03-11 LAB — LIPID PANEL
Cholesterol: 161 (ref 0–200)
HDL: 63 (ref 35–70)
LDL Cholesterol: 87
Triglycerides: 54 (ref 40–160)

## 2019-03-11 LAB — HEMOGLOBIN A1C: Hemoglobin A1C: 6.5

## 2019-03-11 LAB — BASIC METABOLIC PANEL
BUN: 14 (ref 4–21)
Creatinine: 0.8 (ref 0.5–1.1)
Glucose: 82

## 2019-04-06 ENCOUNTER — Telehealth: Payer: Self-pay | Admitting: Nurse Practitioner

## 2019-04-06 NOTE — Telephone Encounter (Signed)
Questions for Screening COVID-19  Symptom onset: n/a  Travel or Contacts: no  During this illness, did/does the patient experience any of the following symptoms? Fever >100.80F []   Yes [x]   No []   Unknown Subjective fever (felt feverish) []   Yes [x]   No []   Unknown Chills []   Yes [x]   No []   Unknown Muscle aches (myalgia) []   Yes [x]   No []   Unknown Runny nose (rhinorrhea) []   Yes [x]   No []   Unknown Sore throat []   Yes [x]   No []   Unknown Cough (new onset or worsening of chronic cough) []   Yes [x]   No []   Unknown Shortness of breath (dyspnea) []   Yes [x]   No []   Unknown Nausea or vomiting []   Yes [x]   No []   Unknown Headache []   Yes [x]   No []   Unknown Abdominal pain  []   Yes [x]   No []   Unknown Diarrhea (?3 loose/looser than normal stools/24hr period) []   Yes [x]   No []   Unknown Other, specify:  Patient risk factors: Smoker? []   Current []   Former []   Never If female, currently pregnant? []   Yes []   No  Patient Active Problem List   Diagnosis Date Noted  . Type 2 diabetes mellitus without complication, without long-term current use of insulin ([place]) 01/01/2019  . Seasonal allergies 05/20/2018  . Chronic bilateral low back pain with left-sided sciatica 05/20/2018  . Lipoma of left upper extremity 01/29/2018  . Muscle spasm of left lower extremity 01/29/2018  . Intestinal ulcer 04/23/2017  . Bilateral lower extremity edema 04/23/2017  . Somatic dysfunction of cervical region 11/16/2015  . Chronic sciatica 01/28/2015  . Somatic dysfunction of pelvic region 01/28/2015  . Somatic dysfunction of lumbar region 01/28/2015  . Somatic dysfunction of lower extremity 01/28/2015  . Somatic dysfunction of sacral region 01/28/2015  . Mild persistent asthma with allergic rhinitis   10/22/2014  . Essential hypertension 10/22/2014  . Morbid obesity due to excess calories ([place]) 10/22/2014  . Abdominal muscle strain 06/07/2014  . Sciatica of left side 11/03/2012    Plan:  []   High risk  for COVID-19 with red flags go to ED (with CP, SOB, weak/lightheaded, or fever > 101.5). Call ahead.  []   High risk for COVID-19 but stable. Inform provider and coordinate time for [facility] visit.   []   No red flags but URI signs or symptoms okay for [facility] visit.

## 2019-04-07 ENCOUNTER — Other Ambulatory Visit: Payer: Self-pay

## 2019-04-07 ENCOUNTER — Ambulatory Visit (INDEPENDENT_AMBULATORY_CARE_PROVIDER_SITE_OTHER): Payer: BC Managed Care – PPO | Admitting: Nurse Practitioner

## 2019-04-07 ENCOUNTER — Encounter: Payer: Self-pay | Admitting: Nurse Practitioner

## 2019-04-07 VITALS — BP 126/66 | HR 83 | Temp 98.1°F | Ht 60.0 in | Wt 230.6 lb

## 2019-04-07 DIAGNOSIS — E119 Type 2 diabetes mellitus without complications: Secondary | ICD-10-CM | POA: Diagnosis not present

## 2019-04-07 DIAGNOSIS — I10 Essential (primary) hypertension: Secondary | ICD-10-CM | POA: Diagnosis not present

## 2019-04-07 DIAGNOSIS — M5442 Lumbago with sciatica, left side: Secondary | ICD-10-CM | POA: Diagnosis not present

## 2019-04-07 DIAGNOSIS — G8929 Other chronic pain: Secondary | ICD-10-CM

## 2019-04-07 LAB — GLUCOSE, POCT (MANUAL RESULT ENTRY): POC Glucose: 127 mg/dl — AB (ref 70–99)

## 2019-04-07 MED ORDER — METFORMIN HCL ER 750 MG PO TB24: 750.0000 mg | ORAL_TABLET | Freq: Every day | ORAL | 1 refills | Status: DC

## 2019-04-07 MED ORDER — KETOROLAC TROMETHAMINE 30 MG/ML IJ SOLN
30.0000 mg | Freq: Once | INTRAMUSCULAR | Status: AC
  Administered 2019-04-07: 30 mg via INTRAMUSCULAR

## 2019-04-07 NOTE — Progress Notes (Signed)
Subjective:  Patient ID: Mackenzie Weber, female    DOB: September 29, 1961  Age: 58 y.o. 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 Radiation to left thigh, no weakness, no loss of sensation, no change in GU/GI function, no perianal numbness. Minimal improvement with ibuprofen and tylenol.  She is requesting for toradol injection, last injection 07/2018 with significant relief per patient.  DM: Unable to tolerate metformin BID, reports diarrhea and ABD pain. Takes metformin once a day. Last hgb A1c of 7.0. Up to date with eye exam, normal urine microalbumin, no neuropathy.  HTN: BP at goal with losartan/HCTZ BP Readings from Last 3 Encounters:  04/07/19 126/66  01/01/19 114/70  10/29/18 120/70   Obesity: Trying to make changes to diet and start exercise with guidance from [facility] weight loss clinic. Wt Readings from Last 3 Encounters:  04/07/19 230 lb 9.6 oz (104.6 kg)  01/01/19 231 lb 3.2 oz (104.9 kg)  10/29/18 227 lb (103 kg)   Reviewed past Medical, Social and Family history today.  Outpatient Medications Prior to Visit  Medication Sig Dispense Refill  . budesonide-formoterol (SYMBICORT) 80-4.5 MCG/ACT inhaler INHALE 2 PUFFS FIRST THING IN THE MORNING AND THEN INHALE ANOTHER 2 PUFFS ABOUT 12 HOURS LATER 10.2 g 5  . EPINEPHrine (EPIPEN JR) 0.15 MG/0.3ML injection Inject 0.15 mg into the muscle daily as needed for anaphylaxis.     Marland Kitchen esomeprazole (NEXIUM) 40 MG capsule Take 1 capsule (40 mg total) by mouth 2 (two) times daily before a meal. 180 capsule 4  . estradiol (ESTRACE) 0.1 MG/GM vaginal cream Place 9.79 Applicatorfuls vaginally daily. 42.5 g 11  . losartan-hydrochlorothiazide  (HYZAAR) 100-12.5 MG tablet Take 1 tablet by mouth daily. 90 tablet 3  . meloxicam (MOBIC) 7.5 MG tablet Take 1 tablet (7.5 mg total) by mouth daily. May take up to 2 tabs po daily prn severe pain 60 tablet 6  . montelukast (SINGULAIR) 10 MG tablet Take 1 tablet (10 mg total) by mouth at bedtime. FOR SEASONAL ALLERGY 90 tablet 3  . Multiple Vitamin (MULTIVITAMIN) capsule Take 1 capsule by mouth daily.    Marland Kitchen PROAIR HFA 108 (90 Base) MCG/ACT inhaler Inhale 2 puffs into the lungs every 6 (six) hours as needed for wheezing or shortness of breath. Inhale 2 puff into the lungs every 6 hours as needed for wheezing or shortness of breath 18 g 5  . metFORMIN (GLUCOPHAGE) 500 MG tablet Take 1 tablet (500 mg total) by mouth 2 (two) times daily with a meal. 60 tablet 5  . sucralfate (CARAFATE) 1 g tablet sucralfate 1 gram tablet     No facility-administered medications prior to visit.     ROS See HPI  Objective:  BP 126/66   Pulse 83   Temp 98.1 F (36.7 C) (Oral)   Ht 5' (1.524 m)   Wt 230 lb 9.6 oz (104.6 kg)   SpO2 99%   BMI 45.04 kg/m   BP Readings from Last 3 Encounters:  04/07/19 126/66  01/01/19 114/70  10/29/18 120/70    Wt Readings from Last 3 Encounters:  04/07/19 230 lb 9.6 oz (104.6 kg)  01/01/19 231 lb 3.2 oz (104.9 kg)  10/29/18 227  lb (103 kg)    Physical Exam Vitals signs reviewed.  Cardiovascular:     Rate and Rhythm: Normal rate and regular rhythm.     Pulses: Normal pulses.     Heart sounds: Normal heart sounds.  Pulmonary:     Effort: Pulmonary effort is normal.     Breath sounds: Normal breath sounds.  Musculoskeletal:     Right hip: Normal.     Left hip: Normal.     Lumbar back: She exhibits tenderness. She exhibits normal range of motion and no bony tenderness.     Right lower leg: Edema present.     Left lower leg: Edema present.  Neurological:     Mental Status: She is alert and oriented to person, place, and time.  Psychiatric:        Mood and  Affect: Mood normal.        Behavior: Behavior normal.        Thought Content: Thought content normal.     Lab Results  Component Value Date   WBC 6.7 03/18/2018   HGB 11.6 (L) 03/18/2018   HCT 38.0 03/18/2018   PLT 340 03/18/2018   GLUCOSE 95 01/01/2019   CHOL 165 01/01/2019   TRIG 63.0 01/01/2019   HDL 53.30 01/01/2019   LDLCALC 100 (H) 01/01/2019   ALT 11 01/01/2019   AST 13 01/01/2019   NA 139 01/01/2019   K 4.0 01/01/2019   CL 104 01/01/2019   CREATININE 0.91 01/01/2019   BUN 17 01/01/2019   CO2 29 01/01/2019   TSH 2.84 01/01/2019   HGBA1C 7.0 (H) 01/01/2019   MICROALBUR <0.7 01/01/2019    Assessment & Plan:   Mackenzie Weber was seen today for follow-up.  Diagnoses and all orders for this visit:  Essential hypertension  Type 2 diabetes mellitus without complication, without long-term current use of insulin (HCC) -     POCT Glucose (CBG) -     metFORMIN (GLUCOPHAGE-XR) 750 MG 24 hr tablet; Take 1 tablet (750 mg total) by mouth daily with breakfast.  Chronic bilateral low back pain with left-sided sciatica -     ketorolac (TORADOL) 30 MG/ML injection 30 mg   I have discontinued Mackenzie Weber's metFORMIN. I am also having her start on metFORMIN. Additionally, I am having her maintain her EPINEPHrine, estradiol, multivitamin, esomeprazole, ProAir HFA, meloxicam, budesonide-formoterol, sucralfate, losartan-hydrochlorothiazide, and montelukast. We administered ketorolac.  Meds ordered this encounter  Medications  . ketorolac (TORADOL) 30 MG/ML injection 30 mg  . metFORMIN (GLUCOPHAGE-XR) 750 MG 24 hr tablet    Sig: Take 1 tablet (750 mg total) by mouth daily with breakfast.    Dispense:  90 tablet    Refill:  1    Discontinue metformin 500mg     Order Specific Question:   Supervising Provider    Answer:   Lucille Passy [[id number]]    Problem List Items Addressed This Visit      Cardiovascular and Mediastinum   Essential hypertension - Primary     Endocrine   Type  2 diabetes mellitus without complication, without long-term current use of insulin (HCC)   Relevant Medications   metFORMIN (GLUCOPHAGE-XR) 750 MG 24 hr tablet   Other Relevant Orders   POCT Glucose (CBG) (Completed)     Nervous and Auditory   Chronic bilateral low back pain with left-sided sciatica       Follow-up: Return in about 3 months (around 07/08/2019) for DM and HTN, hyperlipidemia (fasting).  Wilfred Lacy, NP

## 2019-04-07 NOTE — Patient Instructions (Signed)
Change metformin with 750mg  XR.  Start back stretching exercise, alternate between warm and cold compress as needed

## 2019-04-14 ENCOUNTER — Encounter: Payer: Self-pay | Admitting: Nurse Practitioner

## 2019-04-14 NOTE — Progress Notes (Signed)
Abstracted result and sent to scan  

## 2019-04-24 ENCOUNTER — Emergency Department (HOSPITAL_BASED_OUTPATIENT_CLINIC_OR_DEPARTMENT_OTHER)
Admission: EM | Admit: 2019-04-24 | Discharge: 2019-04-24 | Disposition: A | Discharge status: Home / Self Care | Payer: BC Managed Care – PPO | Attending: Emergency Medicine | Admitting: Emergency Medicine

## 2019-04-24 ENCOUNTER — Emergency Department (HOSPITAL_BASED_OUTPATIENT_CLINIC_OR_DEPARTMENT_OTHER): Payer: BC Managed Care – PPO

## 2019-04-24 ENCOUNTER — Other Ambulatory Visit: Payer: Self-pay

## 2019-04-24 ENCOUNTER — Encounter (HOSPITAL_BASED_OUTPATIENT_CLINIC_OR_DEPARTMENT_OTHER): Payer: Self-pay | Admitting: *Deleted

## 2019-04-24 DIAGNOSIS — M25511 Pain in right shoulder: Secondary | ICD-10-CM | POA: Insufficient documentation

## 2019-04-24 DIAGNOSIS — M25551 Pain in right hip: Secondary | ICD-10-CM | POA: Diagnosis present

## 2019-04-24 DIAGNOSIS — E119 Type 2 diabetes mellitus without complications: Secondary | ICD-10-CM | POA: Diagnosis not present

## 2019-04-24 DIAGNOSIS — S161XXA Strain of muscle, fascia and tendon at neck level, initial encounter: Secondary | ICD-10-CM

## 2019-04-24 DIAGNOSIS — J45909 Unspecified asthma, uncomplicated: Secondary | ICD-10-CM | POA: Insufficient documentation

## 2019-04-24 DIAGNOSIS — I10 Essential (primary) hypertension: Secondary | ICD-10-CM | POA: Insufficient documentation

## 2019-04-24 DIAGNOSIS — Y999 Unspecified external cause status: Secondary | ICD-10-CM | POA: Diagnosis not present

## 2019-04-24 DIAGNOSIS — Z7984 Long term (current) use of oral hypoglycemic drugs: Secondary | ICD-10-CM | POA: Diagnosis not present

## 2019-04-24 DIAGNOSIS — Y92410 Unspecified street and highway as the place of occurrence of the external cause: Secondary | ICD-10-CM | POA: Insufficient documentation

## 2019-04-24 DIAGNOSIS — Y939 Activity, unspecified: Secondary | ICD-10-CM | POA: Insufficient documentation

## 2019-04-24 DIAGNOSIS — Z79899 Other long term (current) drug therapy: Secondary | ICD-10-CM | POA: Insufficient documentation

## 2019-04-24 MED ORDER — IBUPROFEN 400 MG PO TABS
600.0000 mg | ORAL_TABLET | Freq: Once | ORAL | Status: AC
  Administered 2019-04-24: 600 mg via ORAL
  Filled 2019-04-24: qty 1

## 2019-04-24 MED ORDER — METHOCARBAMOL 500 MG PO TABS
500.0000 mg | ORAL_TABLET | Freq: Once | ORAL | Status: AC
  Administered 2019-04-24: 500 mg via ORAL
  Filled 2019-04-24: qty 1

## 2019-04-24 MED ORDER — METHOCARBAMOL 500 MG PO TABS: 500.0000 mg | ORAL_TABLET | Freq: Two times a day (BID) | ORAL | 0 refills | Status: DC

## 2019-04-24 NOTE — ED Notes (Signed)
ED Provider at bedside. 
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
## 2019-04-28 ENCOUNTER — Encounter: Payer: Self-pay | Admitting: Nurse Practitioner

## 2019-04-28 NOTE — Progress Notes (Signed)
Abstracted result and sent to scan  

## 2019-04-30 ENCOUNTER — Other Ambulatory Visit: Payer: Self-pay

## 2019-05-01 ENCOUNTER — Encounter: Payer: Self-pay | Admitting: Nurse Practitioner

## 2019-05-01 ENCOUNTER — Ambulatory Visit (INDEPENDENT_AMBULATORY_CARE_PROVIDER_SITE_OTHER): Payer: BC Managed Care – PPO | Admitting: Nurse Practitioner

## 2019-05-01 ENCOUNTER — Ambulatory Visit: Payer: BC Managed Care – PPO

## 2019-05-01 ENCOUNTER — Ambulatory Visit (INDEPENDENT_AMBULATORY_CARE_PROVIDER_SITE_OTHER): Payer: BC Managed Care – PPO

## 2019-05-01 DIAGNOSIS — M5441 Lumbago with sciatica, right side: Secondary | ICD-10-CM | POA: Diagnosis not present

## 2019-05-01 MED ORDER — TRAMADOL HCL 50 MG PO TABS: 50.0000 mg | ORAL_TABLET | Freq: Two times a day (BID) | ORAL | 0 refills | Status: AC | PRN

## 2019-05-01 MED ORDER — CYCLOBENZAPRINE HCL 5 MG PO TABS: 5.0000 mg | ORAL_TABLET | Freq: Every day | ORAL | 0 refills | Status: DC

## 2019-05-01 MED ORDER — PREDNISONE 20 MG PO TABS: ORAL_TABLET | ORAL | 0 refills | Status: AC

## 2019-05-01 NOTE — Patient Instructions (Addendum)
No acute finding. Oral prednisone sent. Stop robaxin and use flexeril at bedtime only. Alternated between warm and cold compress as needed. Avoid prolong sitting. Call office if no improvement in 2weeks
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
## 2019-05-28 ENCOUNTER — Telehealth: Payer: Self-pay

## 2019-05-28 NOTE — Telephone Encounter (Signed)
Questions for Screening COVID-19  Symptom onset: n/a  Travel or Contacts:no  During this illness, did/does the patient experience any of the following symptoms? Fever >100.70F []   Yes [x]   No []   Unknown Subjective fever (felt feverish) []   Yes [x]   No []   Unknown Chills []   Yes [x]   No []   Unknown Muscle aches (myalgia) []   Yes [x]   No []   Unknown Runny nose (rhinorrhea) []   Yes [x]   No []   Unknown Sore throat []   Yes [x]   No []   Unknown Cough (new onset or worsening of chronic cough) []   Yes [x]   No []   Unknown Shortness of breath (dyspnea) []   Yes [x]   No []   Unknown Nausea or vomiting []   Yes [x]   No []   Unknown Headache []   Yes [x]   No []   Unknown Abdominal pain  []   Yes [x]   No []   Unknown Diarrhea (?3 loose/looser than normal stools/24hr period) []   Yes [x]   No []   Unknown Other, specify:  Patient risk factors: Smoker? []   Current []   Former []   Never If female, currently pregnant? []   Yes []   No  Patient Active Problem List   Diagnosis Date Noted  . Type 2 diabetes mellitus without complication, without long-term current use of insulin ([place]) 01/01/2019  . Seasonal allergies 05/20/2018  . Chronic bilateral low back pain with left-sided sciatica 05/20/2018  . Lipoma of left upper extremity 01/29/2018  . Muscle spasm of left lower extremity 01/29/2018  . Intestinal ulcer 04/23/2017  . Bilateral lower extremity edema 04/23/2017  . Somatic dysfunction of cervical region 11/16/2015  . Chronic sciatica 01/28/2015  . Somatic dysfunction of pelvic region 01/28/2015  . Somatic dysfunction of lumbar region 01/28/2015  . Somatic dysfunction of lower extremity 01/28/2015  . Somatic dysfunction of sacral region 01/28/2015  . Mild persistent asthma with allergic rhinitis   10/22/2014  . Essential hypertension 10/22/2014  . Morbid obesity due to excess calories ([place]) 10/22/2014  . Abdominal muscle strain 06/07/2014  . Sciatica of left side 11/03/2012    Plan:  []   High risk for  COVID-19 with red flags go to ED (with CP, SOB, weak/lightheaded, or fever > 101.5). Call ahead.  []   High risk for COVID-19 but stable. Inform provider and coordinate time for [facility] visit.   []   No red flags but URI signs or symptoms okay for [facility] visit.

## 2019-05-29 ENCOUNTER — Other Ambulatory Visit: Payer: Self-pay

## 2019-05-29 ENCOUNTER — Ambulatory Visit (INDEPENDENT_AMBULATORY_CARE_PROVIDER_SITE_OTHER): Payer: BC Managed Care – PPO | Admitting: Nurse Practitioner

## 2019-05-29 ENCOUNTER — Encounter: Payer: Self-pay | Admitting: Nurse Practitioner

## 2019-05-29 VITALS — BP 124/76 | HR 80 | Temp 98.7°F | Ht 60.0 in | Wt 229.6 lb

## 2019-05-29 DIAGNOSIS — M5441 Lumbago with sciatica, right side: Secondary | ICD-10-CM

## 2019-05-29 DIAGNOSIS — M25551 Pain in right hip: Secondary | ICD-10-CM | POA: Diagnosis not present

## 2019-05-29 DIAGNOSIS — G8929 Other chronic pain: Secondary | ICD-10-CM | POA: Diagnosis not present

## 2019-05-29 MED ORDER — NABUMETONE 500 MG PO TABS: 500.0000 mg | ORAL_TABLET | Freq: Two times a day (BID) | ORAL | 0 refills | Status: DC | PRN
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
## 2019-05-29 NOTE — Patient Instructions (Signed)
Use relafen as needed for pain. You will be contacted to schedule appt for physical therapy.  Schedule appt for hip injection if you change your mind.

## 2019-06-02 ENCOUNTER — Telehealth: Payer: Self-pay

## 2019-06-02 NOTE — Telephone Encounter (Signed)
Pt request a call back around 330 today

## 2019-06-02 NOTE — Telephone Encounter (Signed)
appt made

## 2019-06-02 NOTE — Telephone Encounter (Signed)
Copied from [place] [phone] [id number]. Topic: Appointment Scheduling - Scheduling Inquiry for Clinic >> Jun 01, 2019  4:03 PM Celene Kras A wrote: Reason for CRM: Pt called stating PCP recommended a shot for her. Pt states she does not remember the name of it. Please advise.

## 2019-06-09 ENCOUNTER — Ambulatory Visit: Payer: BC Managed Care – PPO | Admitting: Nurse Practitioner

## 2019-06-10 ENCOUNTER — Telehealth: Payer: Self-pay | Admitting: Nurse Practitioner

## 2019-06-10 NOTE — Telephone Encounter (Signed)

## 2019-06-11 ENCOUNTER — Ambulatory Visit (INDEPENDENT_AMBULATORY_CARE_PROVIDER_SITE_OTHER): Payer: BC Managed Care – PPO | Admitting: Nurse Practitioner

## 2019-06-11 ENCOUNTER — Encounter: Payer: Self-pay | Admitting: Nurse Practitioner

## 2019-06-11 ENCOUNTER — Other Ambulatory Visit: Payer: Self-pay

## 2019-06-11 VITALS — BP 120/70 | HR 79 | Temp 97.6°F | Ht 60.0 in | Wt 226.2 lb

## 2019-06-11 DIAGNOSIS — G8929 Other chronic pain: Secondary | ICD-10-CM

## 2019-06-11 DIAGNOSIS — M7061 Trochanteric bursitis, right hip: Secondary | ICD-10-CM | POA: Diagnosis not present

## 2019-06-11 DIAGNOSIS — M25551 Pain in right hip: Secondary | ICD-10-CM

## 2019-06-11 MED ORDER — METHYLPREDNISOLONE ACETATE 40 MG/ML IJ SUSP
20.0000 mg | Freq: Once | INTRAMUSCULAR | Status: AC
  Administered 2019-06-11: 20 mg via INTRA_ARTICULAR

## 2019-06-11 NOTE — Patient Instructions (Signed)
Joint Steroid Injection A joint steroid injection is a procedure to relieve swelling and pain in a joint. Steroids are medicines that reduce inflammation. In this procedure, your health care provider uses a syringe and a needle to inject a steroid medicine into a painful and inflamed joint. A pain-relieving medicine (anesthetic) may be injected along with the steroid. In some cases, your health care provider may use an imaging technique such as ultrasound or fluoroscopy to guide the injection. Joints that are often treated with steroid injections include the knee, shoulder, hip, and spine. These injections may also be used in the elbow, ankle, and joints of the hands or feet. You may have joint steroid injections as part of your treatment for inflammation caused by:  Gout.  Rheumatoid arthritis.  Advanced wear-and-tear arthritis (osteoarthritis).  Tendinitis.  Bursitis. Joint steroid injections may be repeated, but having them too often can damage a joint or the skin over the joint. You should not have joint steroid injections less than 6 weeks apart or more than four times a year. Tell a health care provider about:  Any allergies you have.  All medicines you are taking, including vitamins, herbs, eye drops, creams, and over-the-counter medicines.  Any problems you or family members have had with anesthetic medicines.  Any blood disorders you have.  Any surgeries you have had.  Any medical conditions you have.  Whether you are pregnant or may be pregnant. What are the risks? Generally, this is a safe treatment. However, problems may occur, including:  Infection.  Bleeding.  Allergic reactions to medicines.  Damage to the joint or tissues around the joint.  Thinning of skin or loss of skin color over the joint.  Temporary flushing of the face or chest.  Temporary increase in pain.  Temporary increase in blood sugar.  Failure to relieve inflammation or pain. What  happens before the treatment?  You may have imaging tests of your joint.  Ask your health care provider about: ? Changing or stopping your regular medicines. This is especially important if you are taking diabetes medicines or blood thinners. ? Taking medicines such as aspirin and ibuprofen. These medicines can thin your blood. Do not take these medicines unless your health care provider tells you to take them. ? Taking over-the-counter medicines, vitamins, herbs, and supplements.  Ask your health care provider if you can drive yourself home after the procedure. What happens during the treatment?   Your health care provider will position you for the injection and locate the injection site over your joint.  The skin over the joint will be cleaned with a germ-killing soap.  Your health care provider may: ? Spray a numbing solution (topical anesthetic) over the injection site. ? Inject a local anesthetic under the skin above your joint.  The needle will be placed through your skin into your joint. Your health care provider may use imaging to guide the needle to the right spot for the injection. If imaging is used, a special contrast dye may be injected to confirm that the needle is in the correct location.  The steroid medicine will be injected into your joint.  Anesthetic may be injected along with the steroid. This may be a medicine that relieves pain for a short time (short-acting anesthetic) or for a longer time (long-acting anesthetic).  The needle will be removed, and an adhesive bandage (dressing) will be placed over the injection site. The procedure may vary among health care providers and hospitals. What can I   expect after the treatment?  You will be able to go home after the treatment.  It is normal to feel slight flushing for a few days after the injection.  After the treatment, it is common to have an increase in joint pain after the anesthetic has worn off. This may  happen about an hour after a short-acting anesthetic or about 8 hours after a longer-acting anesthetic.  You should begin to feel relief from joint pain and swelling after 24 to 48 hours. Follow these instructions at home: Injection site care  Leave the adhesive dressing over your injection site in place until your health care provider says you can remove it.  Check your injection site every day for signs of infection. Check for: ? Redness, swelling, or pain. ? Fluid or blood. ? Warmth. ? Pus or a bad smell. Activity  Return to your normal activities as told by your health care provider. Ask your health care provider what activities are safe for you. You may be asked to limit activities that put stress on the joint for a few days.  Do joint exercises as told by your health care provider.  Do not take baths, swim, or use a hot tub until your health care provider approves. Managing pain, stiffness, and swelling   If directed, put ice on the joint. ? Put ice in a plastic bag. ? Place a towel between your skin and the bag. ? Leave the ice on for 20 minutes, 2-3 times a day.  Raise (elevate) your joint above the level of your heart when you are sitting or lying down. General instructions  Take over-the-counter and prescription medicines only as told by your health care provider.  Do not use any products that contain nicotine or tobacco, such as cigarettes, e-cigarettes, and chewing tobacco. These can delay joint healing. If you need help quitting, ask your health care provider.  If you have diabetes, be aware that your blood sugar may be slightly elevated for several days after the injection.  Keep all follow-up visits as told by your health care provider. This is important. Contact a health care provider if you have:  Chills or a fever.  Any signs of infection at your injection site.  Increased pain or swelling or no relief after 2 days. Summary  A joint steroid injection  is a treatment to relieve pain and swelling in a joint.  Steroids are medicines that reduce inflammation. Your health care provider may add an anesthetic along with the steroid.  You may have joint steroid injections as part of your arthritis treatment.  Joint steroid injections may be repeated, but having them too often can damage a joint or the skin over the joint.  Contact your health care provider if you have a fever, chills, or signs of infection or if you get no relief from joint pain or swelling. This information is not intended to replace advice given to you by your health care provider. Make sure you discuss any questions you have with your health care provider. Document Released: 06/03/2018 Document Revised: 06/03/2018 Document Reviewed: 06/03/2018 Elsevier Patient Education  2020 [employer]  
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
## 2019-06-17 ENCOUNTER — Ambulatory Visit: Payer: BC Managed Care – PPO | Attending: Nurse Practitioner | Admitting: Physical Therapy

## 2019-06-17 ENCOUNTER — Encounter: Payer: Self-pay | Admitting: Physical Therapy

## 2019-06-17 ENCOUNTER — Other Ambulatory Visit: Payer: Self-pay

## 2019-06-17 DIAGNOSIS — M5441 Lumbago with sciatica, right side: Secondary | ICD-10-CM

## 2019-06-17 DIAGNOSIS — M6281 Muscle weakness (generalized): Secondary | ICD-10-CM | POA: Insufficient documentation

## 2019-06-17 DIAGNOSIS — R29898 Other symptoms and signs involving the musculoskeletal system: Secondary | ICD-10-CM | POA: Diagnosis present

## 2019-06-17 DIAGNOSIS — M25551 Pain in right hip: Secondary | ICD-10-CM | POA: Diagnosis not present
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
## 2019-06-24 HISTORY — PX: COLONOSCOPY: SHX174

## 2019-06-25 ENCOUNTER — Ambulatory Visit: Payer: BC Managed Care – PPO

## 2019-06-25 ENCOUNTER — Other Ambulatory Visit: Payer: Self-pay

## 2019-06-25 DIAGNOSIS — M25551 Pain in right hip: Secondary | ICD-10-CM

## 2019-06-25 DIAGNOSIS — M5441 Lumbago with sciatica, right side: Secondary | ICD-10-CM

## 2019-06-25 DIAGNOSIS — R29898 Other symptoms and signs involving the musculoskeletal system: Secondary | ICD-10-CM

## 2019-06-25 DIAGNOSIS — M6281 Muscle weakness (generalized): Secondary | ICD-10-CM
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
## 2019-06-26 ENCOUNTER — Ambulatory Visit: Payer: BC Managed Care – PPO | Admitting: Physical Therapy

## 2019-06-29 ENCOUNTER — Other Ambulatory Visit: Payer: Self-pay

## 2019-06-29 ENCOUNTER — Ambulatory Visit: Payer: BC Managed Care – PPO

## 2019-06-29 DIAGNOSIS — M25551 Pain in right hip: Secondary | ICD-10-CM | POA: Diagnosis not present

## 2019-06-29 DIAGNOSIS — M5441 Lumbago with sciatica, right side: Secondary | ICD-10-CM

## 2019-06-29 DIAGNOSIS — R29898 Other symptoms and signs involving the musculoskeletal system: Secondary | ICD-10-CM

## 2019-06-29 DIAGNOSIS — M6281 Muscle weakness (generalized): Secondary | ICD-10-CM
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
## 2019-06-29 NOTE — Patient Instructions (Addendum)

## 2019-06-30 ENCOUNTER — Telehealth: Payer: Self-pay | Admitting: Nurse Practitioner

## 2019-06-30 DIAGNOSIS — E119 Type 2 diabetes mellitus without complications: Secondary | ICD-10-CM

## 2019-06-30 MED ORDER — BLOOD GLUCOSE MONITOR KIT: PACK | 0 refills | Status: AC

## 2019-06-30 NOTE — Telephone Encounter (Signed)
Rx faxed to pharmacy. Pt is aware

## 2019-06-30 NOTE — Telephone Encounter (Signed)
appt for 6 mo follow up set.

## 2019-06-30 NOTE — Telephone Encounter (Signed)
Ok to enter order for glucometer

## 2019-06-30 NOTE — Telephone Encounter (Signed)
Mackenzie Weber please advise, ok to send in new kit.     Copied from Mackenzie Weber [phone]. Topic: Quick Communication - See Telephone Encounter >> Jun 30, 2019  8:56 AM Mackenzie Weber wrote: CRM for notification. See Telephone encounter for: 06/30/19.pt states at last visit it was discussed her getting a meter, she went to pick it up and not there. She has a coupon from her insurance for a [employer] please call in this and supplies @ [facility] on [address]

## 2019-07-01 ENCOUNTER — Ambulatory Visit: Payer: BC Managed Care – PPO

## 2019-07-01 ENCOUNTER — Other Ambulatory Visit: Payer: Self-pay

## 2019-07-01 DIAGNOSIS — M25551 Pain in right hip: Secondary | ICD-10-CM | POA: Diagnosis not present

## 2019-07-01 DIAGNOSIS — M6281 Muscle weakness (generalized): Secondary | ICD-10-CM

## 2019-07-01 DIAGNOSIS — R29898 Other symptoms and signs involving the musculoskeletal system: Secondary | ICD-10-CM

## 2019-07-01 DIAGNOSIS — M5441 Lumbago with sciatica, right side: Secondary | ICD-10-CM
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
## 2019-07-06 ENCOUNTER — Ambulatory Visit: Payer: BC Managed Care – PPO

## 2019-07-06 ENCOUNTER — Other Ambulatory Visit: Payer: Self-pay

## 2019-07-06 ENCOUNTER — Ambulatory Visit: Payer: BC Managed Care – PPO | Admitting: Nurse Practitioner

## 2019-07-06 DIAGNOSIS — R29898 Other symptoms and signs involving the musculoskeletal system: Secondary | ICD-10-CM

## 2019-07-06 DIAGNOSIS — M25551 Pain in right hip: Secondary | ICD-10-CM | POA: Diagnosis not present

## 2019-07-06 DIAGNOSIS — M5441 Lumbago with sciatica, right side: Secondary | ICD-10-CM

## 2019-07-06 DIAGNOSIS — M6281 Muscle weakness (generalized): Secondary | ICD-10-CM
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
## 2019-07-13 ENCOUNTER — Ambulatory Visit: Payer: BC Managed Care – PPO | Admitting: Physical Therapy

## 2019-07-15 ENCOUNTER — Encounter: Payer: Self-pay | Admitting: Physical Therapy

## 2019-07-15 ENCOUNTER — Ambulatory Visit: Payer: BC Managed Care – PPO | Admitting: Physical Therapy

## 2019-07-15 ENCOUNTER — Other Ambulatory Visit: Payer: Self-pay

## 2019-07-15 DIAGNOSIS — M25551 Pain in right hip: Secondary | ICD-10-CM

## 2019-07-15 DIAGNOSIS — M5441 Lumbago with sciatica, right side: Secondary | ICD-10-CM

## 2019-07-15 DIAGNOSIS — M6281 Muscle weakness (generalized): Secondary | ICD-10-CM

## 2019-07-15 DIAGNOSIS — R29898 Other symptoms and signs involving the musculoskeletal system: Secondary | ICD-10-CM
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
## 2019-07-21 ENCOUNTER — Other Ambulatory Visit: Payer: Self-pay

## 2019-07-21 ENCOUNTER — Ambulatory Visit: Payer: BC Managed Care – PPO | Attending: Nurse Practitioner | Admitting: Physical Therapy

## 2019-07-21 ENCOUNTER — Encounter: Payer: Self-pay | Admitting: Physical Therapy

## 2019-07-21 DIAGNOSIS — M5441 Lumbago with sciatica, right side: Secondary | ICD-10-CM | POA: Diagnosis present

## 2019-07-21 DIAGNOSIS — R29898 Other symptoms and signs involving the musculoskeletal system: Secondary | ICD-10-CM | POA: Diagnosis present

## 2019-07-21 DIAGNOSIS — M6281 Muscle weakness (generalized): Secondary | ICD-10-CM | POA: Diagnosis present

## 2019-07-21 DIAGNOSIS — M25551 Pain in right hip: Secondary | ICD-10-CM | POA: Diagnosis present

## 2019-07-21 NOTE — Patient Instructions (Addendum)
.  Trigger Point Dry Needling  . What is Trigger Point Dry Needling (DN)? o DN is a physical therapy technique used to treat muscle pain and dysfunction. Specifically, DN helps deactivate muscle trigger points (muscle knots).  o A thin filiform needle is used to penetrate the skin and stimulate the underlying trigger point. The goal is for a local twitch response (LTR) to occur and for the trigger point to relax. No medication of any kind is injected during the procedure.   . What Does Trigger Point Dry Needling Feel Like?  o The procedure feels different for each individual patient. Some patients report that they do not actually feel the needle enter the skin and overall the process is not painful. Very mild bleeding may occur. However, many patients feel a deep cramping in the muscle in which the needle was inserted. This is the local twitch response.   . How Will I feel after the treatment? o Soreness is normal, and the onset of soreness may not occur for a few hours. Typically this soreness does not last longer than two days.  o Bruising is uncommon, however; ice can be used to decrease any possible bruising.  o In rare cases feeling tired or nauseous after the treatment is normal. In addition, your symptoms may get worse before they get better, this period will typically not last longer than 24 hours.   . What Can I do After My Treatment? o Increase your hydration by drinking more water for the next 24 hours. o You may place ice or heat on the areas treated that have become sore, however, do not use heat on inflamed or bruised areas. Heat often brings more relief post needling. o You can continue your regular activities, but vigorous activity is not recommended initially after the treatment for 24 hours. o DN is best combined with other physical therapy such as strengthening, stretching, and other therapies.      Home exercise program created by JoAnne Kreis, PT.  For questions, please  contact JoAnne via phone at [phone] or email at [email or web address]  [facility] [address]  [address] [place], [place], [address] Phone: [phone]   Fax:  [phone]   
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
## 2019-07-23 ENCOUNTER — Ambulatory Visit: Payer: BC Managed Care – PPO | Admitting: Physical Therapy

## 2019-07-23 ENCOUNTER — Other Ambulatory Visit: Payer: Self-pay

## 2019-07-23 ENCOUNTER — Encounter: Payer: Self-pay | Admitting: Physical Therapy

## 2019-07-23 DIAGNOSIS — M6281 Muscle weakness (generalized): Secondary | ICD-10-CM

## 2019-07-23 DIAGNOSIS — M25551 Pain in right hip: Secondary | ICD-10-CM

## 2019-07-23 DIAGNOSIS — M5441 Lumbago with sciatica, right side: Secondary | ICD-10-CM

## 2019-07-23 DIAGNOSIS — R29898 Other symptoms and signs involving the musculoskeletal system: Secondary | ICD-10-CM
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
## 2019-07-27 ENCOUNTER — Encounter: Payer: Self-pay | Admitting: Physical Therapy

## 2019-07-27 ENCOUNTER — Other Ambulatory Visit: Payer: Self-pay

## 2019-07-27 ENCOUNTER — Ambulatory Visit: Payer: BC Managed Care – PPO | Admitting: Physical Therapy

## 2019-07-27 DIAGNOSIS — M5441 Lumbago with sciatica, right side: Secondary | ICD-10-CM

## 2019-07-27 DIAGNOSIS — M25551 Pain in right hip: Secondary | ICD-10-CM | POA: Diagnosis not present

## 2019-07-27 DIAGNOSIS — M6281 Muscle weakness (generalized): Secondary | ICD-10-CM

## 2019-07-27 DIAGNOSIS — R29898 Other symptoms and signs involving the musculoskeletal system: Secondary | ICD-10-CM

## 2019-07-27 NOTE — Patient Instructions (Signed)
    Home exercise program created by  , PT.  For questions, please contact  via phone at [phone] or email at [email or web address]  [facility] [address]  [address] [place], [place], [address] Phone: [phone]   Fax:  [phone]      IONTOPHORESIS PATIENT PRECAUTIONS & CONTRAINDICATIONS:  . Redness under one or both electrodes can occur.  This characterized by a uniform redness that usually disappears within 12 hours of treatment. . Small pinhead size blisters may result in response to the drug.  Contact your physician if the problem persists more than 24 hours. . On rare occasions, iontophoresis therapy can result in temporary skin reactions such as rash, inflammation, irritation or burns.  The skin reactions may be the result of individual sensitivity to the ionic solution used, the condition of the skin at the start of treatment, reaction to the materials in the electrodes, allergies or sensitivity to dexamethasone, or a poor connection between the patch and your skin.  Discontinue using iontophoresis if you have any of these reactions and report to your therapist. . Remove the Patch or electrodes if you have any undue sensation of pain or burning during the treatment and report discomfort to your therapist. . Tell your Therapist if you have had known adverse reactions to the application of electrical current. . Approximate treatment time is 4-6 hours.  Remove the patch after 6 hours. . The Patch can be worn during normal activity, however excessive motion where the electrodes have been placed can cause poor contact between the skin and the electrode or uneven electrical current resulting in greater risk of skin irritation. . Keep out of the reach of children.   . DO NOT use if you have a cardiac pacemaker or any other electrically sensitive implanted device. . DO NOT use if you have a known  sensitivity to dexamethasone. . DO NOT use during Magnetic Resonance Imaging (MRI). . DO NOT use over broken or compromised skin (e.g. sunburn, cuts, or acne) due to the increased risk of skin reaction. . DO NOT SHAVE over the area to be treated:  To establish good contact between the Patch and the skin, excessive hair may be clipped. . DO NOT place the Patch or electrodes on or over your eyes, directly over your heart, or brain. . DO NOT reuse the Patch or electrodes as this may cause burns to occur.   For questions, please contact your therapist at:  [facility] [address]  [address] [place], [place], [address] Phone: [phone]   Fax:  [phone]  
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
## 2019-07-29 ENCOUNTER — Encounter: Payer: Self-pay | Admitting: Physical Therapy

## 2019-07-29 ENCOUNTER — Other Ambulatory Visit: Payer: Self-pay

## 2019-07-29 ENCOUNTER — Ambulatory Visit: Payer: BC Managed Care – PPO | Admitting: Physical Therapy

## 2019-07-29 DIAGNOSIS — M5441 Lumbago with sciatica, right side: Secondary | ICD-10-CM

## 2019-07-29 DIAGNOSIS — R29898 Other symptoms and signs involving the musculoskeletal system: Secondary | ICD-10-CM

## 2019-07-29 DIAGNOSIS — M6281 Muscle weakness (generalized): Secondary | ICD-10-CM

## 2019-07-29 DIAGNOSIS — M25551 Pain in right hip: Secondary | ICD-10-CM
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
## 2019-08-03 ENCOUNTER — Encounter: Payer: Self-pay | Admitting: Physical Therapy

## 2019-08-03 ENCOUNTER — Other Ambulatory Visit: Payer: Self-pay

## 2019-08-03 ENCOUNTER — Ambulatory Visit: Payer: BC Managed Care – PPO | Admitting: Physical Therapy

## 2019-08-03 DIAGNOSIS — M25551 Pain in right hip: Secondary | ICD-10-CM | POA: Diagnosis not present

## 2019-08-03 DIAGNOSIS — M5441 Lumbago with sciatica, right side: Secondary | ICD-10-CM

## 2019-08-03 DIAGNOSIS — M6281 Muscle weakness (generalized): Secondary | ICD-10-CM

## 2019-08-03 DIAGNOSIS — R29898 Other symptoms and signs involving the musculoskeletal system: Secondary | ICD-10-CM
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
## 2019-08-06 ENCOUNTER — Encounter: Payer: Self-pay | Admitting: Physical Therapy

## 2019-08-06 ENCOUNTER — Ambulatory Visit: Payer: BC Managed Care – PPO | Admitting: Physical Therapy

## 2019-08-06 ENCOUNTER — Other Ambulatory Visit: Payer: Self-pay

## 2019-08-06 DIAGNOSIS — R29898 Other symptoms and signs involving the musculoskeletal system: Secondary | ICD-10-CM

## 2019-08-06 DIAGNOSIS — M5441 Lumbago with sciatica, right side: Secondary | ICD-10-CM

## 2019-08-06 DIAGNOSIS — M25551 Pain in right hip: Secondary | ICD-10-CM | POA: Diagnosis not present

## 2019-08-06 DIAGNOSIS — M6281 Muscle weakness (generalized): Secondary | ICD-10-CM
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
## 2019-08-12 ENCOUNTER — Other Ambulatory Visit: Payer: Self-pay

## 2019-08-12 ENCOUNTER — Ambulatory Visit: Payer: BC Managed Care – PPO | Admitting: Physical Therapy

## 2019-08-12 ENCOUNTER — Encounter: Payer: Self-pay | Admitting: Physical Therapy

## 2019-08-12 DIAGNOSIS — R29898 Other symptoms and signs involving the musculoskeletal system: Secondary | ICD-10-CM

## 2019-08-12 DIAGNOSIS — M25551 Pain in right hip: Secondary | ICD-10-CM | POA: Diagnosis not present

## 2019-08-12 DIAGNOSIS — M5441 Lumbago with sciatica, right side: Secondary | ICD-10-CM

## 2019-08-12 DIAGNOSIS — M6281 Muscle weakness (generalized): Secondary | ICD-10-CM
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
## 2019-08-14 ENCOUNTER — Encounter: Payer: Self-pay | Admitting: Physical Therapy

## 2019-08-14 ENCOUNTER — Ambulatory Visit: Payer: BC Managed Care – PPO | Admitting: Physical Therapy

## 2019-08-14 ENCOUNTER — Other Ambulatory Visit: Payer: Self-pay

## 2019-08-14 DIAGNOSIS — M5441 Lumbago with sciatica, right side: Secondary | ICD-10-CM

## 2019-08-14 DIAGNOSIS — M25551 Pain in right hip: Secondary | ICD-10-CM

## 2019-08-14 DIAGNOSIS — M6281 Muscle weakness (generalized): Secondary | ICD-10-CM

## 2019-08-14 DIAGNOSIS — R29898 Other symptoms and signs involving the musculoskeletal system: Secondary | ICD-10-CM

## 2019-08-14 NOTE — Patient Instructions (Signed)
    Home exercise program created by Ziasia Lenoir, PT.  For questions, please contact Adline Kirshenbaum via phone at [phone] or email at [email or web address]   [facility] [place] [address]  [address] [place], [place], [address] Phone: [phone]   Fax:  [phone]    
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
## 2019-08-19 ENCOUNTER — Ambulatory Visit: Payer: BC Managed Care – PPO | Attending: Nurse Practitioner

## 2019-08-19 ENCOUNTER — Other Ambulatory Visit: Payer: Self-pay

## 2019-08-19 DIAGNOSIS — M6281 Muscle weakness (generalized): Secondary | ICD-10-CM | POA: Insufficient documentation

## 2019-08-19 DIAGNOSIS — M25551 Pain in right hip: Secondary | ICD-10-CM | POA: Insufficient documentation

## 2019-08-19 DIAGNOSIS — M5441 Lumbago with sciatica, right side: Secondary | ICD-10-CM | POA: Diagnosis present

## 2019-08-19 DIAGNOSIS — R29898 Other symptoms and signs involving the musculoskeletal system: Secondary | ICD-10-CM
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
## 2019-08-26 ENCOUNTER — Ambulatory Visit: Payer: BC Managed Care – PPO

## 2019-08-26 ENCOUNTER — Other Ambulatory Visit: Payer: Self-pay

## 2019-08-26 DIAGNOSIS — M5441 Lumbago with sciatica, right side: Secondary | ICD-10-CM

## 2019-08-26 DIAGNOSIS — M25551 Pain in right hip: Secondary | ICD-10-CM

## 2019-08-26 DIAGNOSIS — M6281 Muscle weakness (generalized): Secondary | ICD-10-CM

## 2019-08-26 DIAGNOSIS — R29898 Other symptoms and signs involving the musculoskeletal system: Secondary | ICD-10-CM
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
## 2019-09-02 ENCOUNTER — Encounter: Payer: Self-pay | Admitting: Physical Therapy

## 2019-09-02 ENCOUNTER — Other Ambulatory Visit: Payer: Self-pay

## 2019-09-02 ENCOUNTER — Ambulatory Visit: Payer: BC Managed Care – PPO | Admitting: Physical Therapy

## 2019-09-02 DIAGNOSIS — M25551 Pain in right hip: Secondary | ICD-10-CM | POA: Diagnosis not present

## 2019-09-02 DIAGNOSIS — M6281 Muscle weakness (generalized): Secondary | ICD-10-CM

## 2019-09-02 DIAGNOSIS — M5441 Lumbago with sciatica, right side: Secondary | ICD-10-CM

## 2019-09-02 DIAGNOSIS — R29898 Other symptoms and signs involving the musculoskeletal system: Secondary | ICD-10-CM
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
## 2019-09-14 ENCOUNTER — Other Ambulatory Visit: Payer: Self-pay

## 2019-09-14 ENCOUNTER — Encounter: Payer: Self-pay | Admitting: Physical Therapy

## 2019-09-14 ENCOUNTER — Ambulatory Visit: Payer: BC Managed Care – PPO | Admitting: Physical Therapy

## 2019-09-14 DIAGNOSIS — M25551 Pain in right hip: Secondary | ICD-10-CM

## 2019-09-14 DIAGNOSIS — M6281 Muscle weakness (generalized): Secondary | ICD-10-CM

## 2019-09-14 DIAGNOSIS — R29898 Other symptoms and signs involving the musculoskeletal system: Secondary | ICD-10-CM

## 2019-09-14 DIAGNOSIS — M5441 Lumbago with sciatica, right side: Secondary | ICD-10-CM
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
## 2019-11-23 ENCOUNTER — Telehealth: Payer: Self-pay | Admitting: Nurse Practitioner

## 2019-11-23 NOTE — Telephone Encounter (Signed)
Patient is calling and is requesting a refill for Symbicort Inhaler sent to [facility] on [address]. CB is [phone]

## 2019-11-24 MED ORDER — BUDESONIDE-FORMOTEROL FUMARATE 80-4.5 MCG/ACT IN AERO: INHALATION_SPRAY | RESPIRATORY_TRACT | 0 refills | Status: DC

## 2019-11-24 NOTE — Telephone Encounter (Signed)
[facility] sent a message back stating insurance do not cover for Budesonide/form 80/4.5 mcg but they will cover for Symbicort AER, Advair Diskuaer, Adviar HFA AER, Wixela inhubaer, FLuticsalame AER.  Mackenzie Weber please advise

## 2019-11-24 NOTE — Telephone Encounter (Signed)
Rx sent, pt is aware and she scheduled to see Nche on 12/21/2019.

## 2019-11-25 MED ORDER — ADVAIR HFA 45-21 MCG/ACT IN AERO: 2.0000 | INHALATION_SPRAY | Freq: Two times a day (BID) | RESPIRATORY_TRACT | 0 refills | Status: DC

## 2019-11-25 NOTE — Telephone Encounter (Signed)
Ok to switch to advair [facility]

## 2019-11-25 NOTE — Addendum Note (Signed)
Addended byShawnie Pons on: 11/25/2019 02:53 PM   Modules accepted: Orders

## 2019-11-25 NOTE — Telephone Encounter (Signed)
Rx changed and sent in.

## 2019-12-21 ENCOUNTER — Encounter: Payer: Self-pay | Admitting: Nurse Practitioner

## 2019-12-21 ENCOUNTER — Other Ambulatory Visit: Payer: Self-pay

## 2019-12-21 ENCOUNTER — Ambulatory Visit (INDEPENDENT_AMBULATORY_CARE_PROVIDER_SITE_OTHER): Payer: BC Managed Care – PPO | Admitting: Nurse Practitioner

## 2019-12-21 VITALS — BP 110/80 | HR 73 | Temp 97.4°F | Ht 60.0 in | Wt 226.4 lb

## 2019-12-21 DIAGNOSIS — K219 Gastro-esophageal reflux disease without esophagitis: Secondary | ICD-10-CM

## 2019-12-21 DIAGNOSIS — E119 Type 2 diabetes mellitus without complications: Secondary | ICD-10-CM | POA: Diagnosis not present

## 2019-12-21 DIAGNOSIS — Z6841 Body Mass Index (BMI) 40.0 and over, adult: Secondary | ICD-10-CM

## 2019-12-21 DIAGNOSIS — J302 Other seasonal allergic rhinitis: Secondary | ICD-10-CM | POA: Diagnosis not present

## 2019-12-21 DIAGNOSIS — I10 Essential (primary) hypertension: Secondary | ICD-10-CM | POA: Diagnosis not present

## 2019-12-21 LAB — BASIC METABOLIC PANEL
BUN: 13 mg/dL (ref 6–23)
CO2: 29 mEq/L (ref 19–32)
Calcium: 9.5 mg/dL (ref 8.4–10.5)
Chloride: 107 mEq/L (ref 96–112)
Creatinine, Ser: 0.78 mg/dL (ref 0.40–1.20)
GFR: 91.84 mL/min (ref >60.00)
Glucose, Bld: 94 mg/dL (ref 70–99)
Potassium: 3.7 mEq/L (ref 3.5–5.1)
Sodium: 141 mEq/L (ref 135–145)

## 2019-12-21 LAB — HEMOGLOBIN A1C: Hgb A1c MFr Bld: 6.9 % — ABNORMAL HIGH (ref 4.6–6.5)

## 2019-12-21 MED ORDER — MONTELUKAST SODIUM 10 MG PO TABS: 10.0000 mg | ORAL_TABLET | Freq: Every day | ORAL | 3 refills | Status: DC

## 2019-12-21 NOTE — Patient Instructions (Addendum)
Cervical spine completed 04/2019: degenerative disc disease/ arthritis. Will need additional evaluation by spine specialist to investigate bilateral hand numbness in supine position.  HgbA1c at 6.9 Normal renal function D/c metformin and start saxenda injection

## 2019-12-21 NOTE — Progress Notes (Signed)
Subjective:  Patient ID: Mackenzie Weber, female    DOB: 1961/02/22  Age: 59 y.o. MRN: [id number]  CC: Follow-up (follow up for DM-pt not fasting-handful of popcorn 7:15-blood sugar doing good stable-pt has eye exam tomorrow Dr. Tamala Fothergill, needs new epi-pen refill)  HPI DM: Stable hgbA1c at 6.5 No neuropathy, no nephropathy BP at goal with use of losartan/HCTZ LDL at goal Negative urine microalbumin. BMI at 44 with chronic back, hip and foot pain. Limited exercise due to back and foot pain. Ongoing appts with nutritionist. She will like to try saxenda injection BP Readings from Last 3 Encounters:  12/21/19 110/80  06/11/19 120/70  05/29/19 124/76   Wt Readings from Last 3 Encounters:  12/21/19 226 lb 6.4 oz (102.7 kg)  06/11/19 226 lb 3.2 oz (102.6 kg)  05/29/19 229 lb 9.6 oz (104.1 kg)   Reviewed past Medical, Social and Family history today.  Outpatient Medications Prior to Visit  Medication Sig Dispense Refill  . blood glucose meter kit and supplies KIT Check blood sugar once daily. One Touch Verio Reflect Meter 1 each 0  . EPINEPHrine (EPIPEN JR) 0.15 MG/0.3ML injection Inject 0.15 mg into the muscle daily as needed for anaphylaxis.     . fluticasone-salmeterol (ADVAIR HFA) 45-21 MCG/ACT inhaler Inhale 2 puffs into the lungs 2 (two) times daily. 1 Inhaler 0  . Multiple Vitamin (MULTIVITAMIN) capsule Take 1 capsule by mouth daily.    Glory Rosebush VERIO test strip USE TO TEST BLOOD SUGAR ONCE DAILY    . PROAIR HFA 108 (90 Base) MCG/ACT inhaler Inhale 2 puffs into the lungs every 6 (six) hours as needed for wheezing or shortness of breath. Inhale 2 puff into the lungs every 6 hours as needed for wheezing or shortness of breath 18 g 5  . traMADol (ULTRAM) 50 MG tablet Take 50 mg by mouth every 4 (four) hours as needed.    Marland Kitchen esomeprazole (NEXIUM) 40 MG capsule Take 1 capsule (40 mg total) by mouth 2 (two) times daily before a meal. 180 capsule 4  .  losartan-hydrochlorothiazide (HYZAAR) 100-12.5 MG tablet Take 1 tablet by mouth daily. 90 tablet 3  . metFORMIN (GLUCOPHAGE-XR) 750 MG 24 hr tablet Take 1 tablet (750 mg total) by mouth daily with breakfast. 90 tablet 1  . Lancets (ONETOUCH DELICA PLUS [id number]) MISC USE TO TEST BLOOD SUGAR ONCE DAILY    . sucralfate (CARAFATE) 1 g tablet sucralfate 1 gram tablet    . cyclobenzaprine (FLEXERIL) 5 MG tablet Take 1 tablet (5 mg total) by mouth at bedtime. (Patient not taking: Reported on 12/21/2019) 14 tablet 0  . estradiol (ESTRACE) 0.1 MG/GM vaginal cream Place 8.25 Applicatorfuls vaginally daily. (Patient not taking: Reported on 12/21/2019) 42.5 g 11  . montelukast (SINGULAIR) 10 MG tablet Take 1 tablet (10 mg total) by mouth at bedtime. FOR SEASONAL ALLERGY (Patient not taking: Reported on 12/21/2019) 90 tablet 3  . nabumetone (RELAFEN) 500 MG tablet Take 1 tablet (500 mg total) by mouth 2 (two) times daily as needed (with food). (Patient not taking: Reported on 12/21/2019) 30 tablet 0   No facility-administered medications prior to visit.   ROS See HPI  Objective:  BP 110/80   Pulse 73   Temp (!) 97.4 F (36.3 C) (Tympanic)   Ht 5' (1.524 m)   Wt 226 lb 6.4 oz (102.7 kg)   SpO2 99%   BMI 44.22 kg/m   BP Readings from Last 3 Encounters:  12/21/19 110/80  06/11/19 120/70  05/29/19 124/76    Wt Readings from Last 3 Encounters:  12/21/19 226 lb 6.4 oz (102.7 kg)  06/11/19 226 lb 3.2 oz (102.6 kg)  05/29/19 229 lb 9.6 oz (104.1 kg)    Physical Exam Vitals reviewed.  Cardiovascular:     Rate and Rhythm: Normal rate and regular rhythm.     Pulses: Normal pulses.     Heart sounds: Normal heart sounds.  Pulmonary:     Effort: Pulmonary effort is normal.     Breath sounds: Normal breath sounds.  Musculoskeletal:     Right lower leg: Edema present.     Left lower leg: Edema present.  Neurological:     Mental Status: She is alert and oriented to person, place, and time.    Lab  Results  Component Value Date   WBC 6.7 03/18/2018   HGB 11.6 (L) 03/18/2018   HCT 38.0 03/18/2018   PLT 340 03/18/2018   GLUCOSE 94 12/21/2019   CHOL 161 03/11/2019   TRIG 54 03/11/2019   HDL 63 03/11/2019   LDLCALC 87 03/11/2019   ALT 10 03/11/2019   AST 14 03/11/2019   NA 141 12/21/2019   K 3.7 12/21/2019   CL 107 12/21/2019   CREATININE 0.78 12/21/2019   BUN 13 12/21/2019   CO2 29 12/21/2019   TSH 2.84 01/01/2019   HGBA1C 6.9 (H) 12/21/2019   MICROALBUR <0.7 01/01/2019   Assessment & Plan:  This visit occurred during the SARS-CoV-2 public health emergency.  Safety protocols were in place, including screening questions prior to the visit, additional usage of staff PPE, and extensive cleaning of exam room while observing appropriate contact time as indicated for disinfecting solutions.   Luka was seen today for follow-up.  Diagnoses and all orders for this visit:  Essential hypertension -     Basic metabolic panel -     losartan-hydrochlorothiazide (HYZAAR) 100-12.5 MG tablet; Take 1 tablet by mouth daily.  Type 2 diabetes mellitus without complication, without long-term current use of insulin (HCC) -     Hemoglobin A1c -     Basic metabolic panel -     Liraglutide -Weight Management (SAXENDA) 18 MG/3ML SOPN; Inject 0.1 mLs (0.6 mg total) into the skin daily for 7 days, THEN 0.2 mLs (1.2 mg total) daily for 7 days, THEN 0.3 mLs (1.8 mg total) daily for 16 days.  Seasonal allergies -     montelukast (SINGULAIR) 10 MG tablet; Take 1 tablet (10 mg total) by mouth at bedtime. FOR SEASONAL ALLERGY  BMI 40.0-44.9, adult (HCC) -     Liraglutide -Weight Management (SAXENDA) 18 MG/3ML SOPN; Inject 0.1 mLs (0.6 mg total) into the skin daily for 7 days, THEN 0.2 mLs (1.2 mg total) daily for 7 days, THEN 0.3 mLs (1.8 mg total) daily for 16 days.  Morbid obesity due to excess calories (HCC) -     Liraglutide -Weight Management (SAXENDA) 18 MG/3ML SOPN; Inject 0.1 mLs (0.6 mg  total) into the skin daily for 7 days, THEN 0.2 mLs (1.2 mg total) daily for 7 days, THEN 0.3 mLs (1.8 mg total) daily for 16 days.  Gastroesophageal reflux disease without esophagitis -     esomeprazole (NEXIUM) 20 MG capsule; Take 1 capsule (20 mg total) by mouth daily before breakfast.   I have discontinued Anaijah W. Alioto's estradiol, metFORMIN, cyclobenzaprine, and nabumetone. I have also changed her esomeprazole. Additionally, I am having her start on Saxenda. Lastly, I am having her  maintain her EPINEPHrine, multivitamin, ProAir HFA, sucralfate, blood glucose meter kit and supplies, Advair HFA, traMADol, OneTouch Delica Plus [id number], OneTouch Verio, montelukast, and losartan-hydrochlorothiazide.  Meds ordered this encounter  Medications  . montelukast (SINGULAIR) 10 MG tablet    Sig: Take 1 tablet (10 mg total) by mouth at bedtime. FOR SEASONAL ALLERGY    Dispense:  90 tablet    Refill:  3    Order Specific Question:   Supervising Provider    Answer:   Ronnald Nian [[id number]]  . Liraglutide -Weight Management (SAXENDA) 18 MG/3ML SOPN    Sig: Inject 0.1 mLs (0.6 mg total) into the skin daily for 7 days, THEN 0.2 mLs (1.2 mg total) daily for 7 days, THEN 0.3 mLs (1.8 mg total) daily for 16 days.    Dispense:  9 mL    Refill:  1    Order Specific Question:   Supervising Provider    Answer:   Ronnald Nian [[id number]]  . losartan-hydrochlorothiazide (HYZAAR) 100-12.5 MG tablet    Sig: Take 1 tablet by mouth daily.    Dispense:  90 tablet    Refill:  1    Order Specific Question:   Supervising Provider    Answer:   Ronnald Nian [id number]  . esomeprazole (NEXIUM) 20 MG capsule    Sig: Take 1 capsule (20 mg total) by mouth daily before breakfast.    Dispense:  90 capsule    Refill:  1    Order Specific Question:   Supervising Provider    Answer:   Ronnald Nian [[id number]]    Problem List Items Addressed This Visit      Cardiovascular and Mediastinum    Hypertensive disorder - Primary   Relevant Medications   losartan-hydrochlorothiazide (HYZAAR) 100-12.5 MG tablet   Other Relevant Orders   Basic metabolic panel (Completed)     Digestive   Intestinal ulcer   Relevant Medications   esomeprazole (NEXIUM) 20 MG capsule     Endocrine   Type 2 diabetes mellitus without complication, without long-term current use of insulin (HCC)   Relevant Medications   Liraglutide -Weight Management (SAXENDA) 18 MG/3ML SOPN   losartan-hydrochlorothiazide (HYZAAR) 100-12.5 MG tablet   Other Relevant Orders   Hemoglobin A1c (Completed)   Basic metabolic panel (Completed)     Other   Morbid obesity due to excess calories (HCC)   Relevant Medications   Liraglutide -Weight Management (SAXENDA) 18 MG/3ML SOPN   Seasonal allergies   Relevant Medications   montelukast (SINGULAIR) 10 MG tablet    Other Visit Diagnoses    BMI 40.0-44.9, adult ([facility])       Relevant Medications   Liraglutide -Weight Management (SAXENDA) 18 MG/3ML SOPN      Follow-up: Return in about 3 months (around 03/22/2020) for Obesity, DM and HTN, hyperlipidemia (fasting).  Wilfred Lacy, NP

## 2019-12-23 ENCOUNTER — Encounter: Payer: Self-pay | Admitting: Nurse Practitioner

## 2019-12-23 DIAGNOSIS — Z6841 Body Mass Index (BMI) 40.0 and over, adult: Secondary | ICD-10-CM | POA: Insufficient documentation

## 2019-12-23 MED ORDER — SAXENDA 18 MG/3ML ~~LOC~~ SOPN: PEN_INJECTOR | SUBCUTANEOUS | 1 refills | Status: AC

## 2019-12-23 MED ORDER — LOSARTAN POTASSIUM-HCTZ 100-12.5 MG PO TABS: 1.0000 | ORAL_TABLET | Freq: Every day | ORAL | 1 refills | Status: DC

## 2019-12-23 MED ORDER — ESOMEPRAZOLE MAGNESIUM 20 MG PO CPDR: 20.0000 mg | DELAYED_RELEASE_CAPSULE | Freq: Every day | ORAL | 1 refills | Status: DC

## 2019-12-25 ENCOUNTER — Telehealth: Payer: Self-pay | Admitting: Nurse Practitioner

## 2019-12-25 NOTE — Telephone Encounter (Signed)
Pharmacy is aware. PA approved.

## 2019-12-25 NOTE — Telephone Encounter (Signed)
PA stated for Saxenda, waiting for result  [facility] (Key: [id number])

## 2020-01-04 ENCOUNTER — Other Ambulatory Visit: Payer: Self-pay

## 2020-01-05 ENCOUNTER — Encounter: Payer: Self-pay | Admitting: Nurse Practitioner

## 2020-01-05 ENCOUNTER — Ambulatory Visit (INDEPENDENT_AMBULATORY_CARE_PROVIDER_SITE_OTHER): Payer: BC Managed Care – PPO | Admitting: Nurse Practitioner

## 2020-01-05 VITALS — BP 120/70 | HR 84 | Temp 97.4°F | Ht 60.0 in | Wt 227.0 lb

## 2020-01-05 DIAGNOSIS — R600 Localized edema: Secondary | ICD-10-CM | POA: Diagnosis not present

## 2020-01-05 DIAGNOSIS — M79604 Pain in right leg: Secondary | ICD-10-CM | POA: Diagnosis not present

## 2020-01-05 MED ORDER — POTASSIUM CHLORIDE CRYS ER 20 MEQ PO TBCR: 20.0000 meq | EXTENDED_RELEASE_TABLET | Freq: Every day | ORAL | 0 refills | Status: DC

## 2020-01-05 MED ORDER — FUROSEMIDE 20 MG PO TABS: 20.0000 mg | ORAL_TABLET | Freq: Every day | ORAL | 0 refills | Status: DC

## 2020-01-05 NOTE — Patient Instructions (Signed)
You will be contacted to schedule appt for venous doppler Take furosemide and potassium for LE edema.

## 2020-01-05 NOTE — Progress Notes (Signed)
Subjective:  Patient ID: Mackenzie Weber, female    DOB: 10/11/1961  Age: 59 y.o. MRN: [id number]  CC: Edema (swelling in both feet but mainky in right//left had hairline fracture an was in a boot//Friday started swelling in right foot)  HPI Ms. Hoots reports worsening R. LE edema and pain in last 2weeks. Her current job and online classes leads to to prolonged sitting and inactivity (8-12hrs a day). Pain and swellign worsened with use of compression stocking. She denies any recent travel or surgery. No hx of DVT.  Reviewed past Medical, Social and Family history today.  Outpatient Medications Prior to Visit  Medication Sig Dispense Refill  . blood glucose meter kit and supplies KIT Check blood sugar once daily. One Touch Verio Reflect Meter 1 each 0  . EPINEPHrine (EPIPEN JR) 0.15 MG/0.3ML injection Inject 0.15 mg into the muscle daily as needed for anaphylaxis.     Marland Kitchen esomeprazole (NEXIUM) 20 MG capsule Take 1 capsule (20 mg total) by mouth daily before breakfast. 90 capsule 1  . fluticasone-salmeterol (ADVAIR HFA) 45-21 MCG/ACT inhaler Inhale 2 puffs into the lungs 2 (two) times daily. 1 Inhaler 0  . Lancets (ONETOUCH DELICA PLUS [id number]) MISC USE TO TEST BLOOD SUGAR ONCE DAILY    . Liraglutide -Weight Management (SAXENDA) 18 MG/3ML SOPN Inject 0.1 mLs (0.6 mg total) into the skin daily for 7 days, THEN 0.2 mLs (1.2 mg total) daily for 7 days, THEN 0.3 mLs (1.8 mg total) daily for 16 days. 9 mL 1  . losartan-hydrochlorothiazide (HYZAAR) 100-12.5 MG tablet Take 1 tablet by mouth daily. 90 tablet 1  . montelukast (SINGULAIR) 10 MG tablet Take 1 tablet (10 mg total) by mouth at bedtime. FOR SEASONAL ALLERGY 90 tablet 3  . Multiple Vitamin (MULTIVITAMIN) capsule Take 1 capsule by mouth daily.    Glory Rosebush VERIO test strip USE TO TEST BLOOD SUGAR ONCE DAILY    . PROAIR HFA 108 (90 Base) MCG/ACT inhaler Inhale 2 puffs into the lungs every 6 (six) hours as needed for wheezing or shortness  of breath. Inhale 2 puff into the lungs every 6 hours as needed for wheezing or shortness of breath 18 g 5  . sucralfate (CARAFATE) 1 g tablet sucralfate 1 gram tablet    . traMADol (ULTRAM) 50 MG tablet Take 50 mg by mouth every 4 (four) hours as needed.     No facility-administered medications prior to visit.    ROS See HPI  Objective:  BP 120/70   Pulse 84   Temp (!) 97.4 F (36.3 C) (Tympanic)   Ht 5' (1.524 m)   Wt 227 lb (103 kg)   SpO2 98%   BMI 44.33 kg/m   BP Readings from Last 3 Encounters:  01/05/20 120/70  12/21/19 110/80  06/11/19 120/70    Wt Readings from Last 3 Encounters:  01/05/20 227 lb (103 kg)  12/21/19 226 lb 6.4 oz (102.7 kg)  06/11/19 226 lb 3.2 oz (102.6 kg)    Physical Exam Vitals reviewed.  Constitutional:      Appearance: She is obese.  Cardiovascular:     Rate and Rhythm: Normal rate.     Pulses: Normal pulses.  Pulmonary:     Effort: Pulmonary effort is normal.  Musculoskeletal:        General: Swelling and tenderness present.     Right lower leg: Edema present.     Left lower leg: Edema present.     Comments: R>L  Skin:  Findings: No erythema.  Neurological:     Mental Status: She is alert and oriented to person, place, and time.  Psychiatric:        Mood and Affect: Mood normal.        Behavior: Behavior normal.    Lab Results  Component Value Date   WBC 6.7 03/18/2018   HGB 11.6 (L) 03/18/2018   HCT 38.0 03/18/2018   PLT 340 03/18/2018   GLUCOSE 94 12/21/2019   CHOL 161 03/11/2019   TRIG 54 03/11/2019   HDL 63 03/11/2019   LDLCALC 87 03/11/2019   ALT 10 03/11/2019   AST 14 03/11/2019   NA 141 12/21/2019   K 3.7 12/21/2019   CL 107 12/21/2019   CREATININE 0.78 12/21/2019   BUN 13 12/21/2019   CO2 29 12/21/2019   TSH 2.84 01/01/2019   HGBA1C 6.9 (H) 12/21/2019   MICROALBUR <0.7 01/01/2019   Assessment & Plan:  This visit occurred during the SARS-CoV-2 public health emergency.  Safety protocols were in  place, including screening questions prior to the visit, additional usage of staff PPE, and extensive cleaning of exam room while observing appropriate contact time as indicated for disinfecting solutions.   Annaston was seen today for edema.  Diagnoses and all orders for this visit:  Bilateral lower extremity edema -     Cancel: VAS [place] LOWER EXTREMITY VENOUS (DVT); Future -     VAS [place] LOWER EXTREMITY VENOUS (DVT); Future -     furosemide (LASIX) 20 MG tablet; Take 1 tablet (20 mg total) by mouth daily. -     potassium chloride SA (KLOR-CON) 20 MEQ tablet; Take 1 tablet (20 mEq total) by mouth daily.  Right leg pain -     Cancel: VAS [place] LOWER EXTREMITY VENOUS (DVT); Future -     VAS [place] LOWER EXTREMITY VENOUS (DVT); Future   I am having Mackenzie Weber start on furosemide and potassium chloride SA. I am also having her maintain her EPINEPHrine, multivitamin, ProAir HFA, sucralfate, blood glucose meter kit and supplies, Advair HFA, traMADol, OneTouch Delica Plus [id number], OneTouch Verio, montelukast, Saxenda, losartan-hydrochlorothiazide, and esomeprazole.  Meds ordered this encounter  Medications  . furosemide (LASIX) 20 MG tablet    Sig: Take 1 tablet (20 mg total) by mouth daily.    Dispense:  4 tablet    Refill:  0    Order Specific Question:   Supervising Provider    Answer:   Ronnald Nian [[id number]]  . potassium chloride SA (KLOR-CON) 20 MEQ tablet    Sig: Take 1 tablet (20 mEq total) by mouth daily.    Dispense:  4 tablet    Refill:  0    Order Specific Question:   Supervising Provider    Answer:   Ronnald Nian [[id number]]    Problem List Items Addressed This Visit      Other   Bilateral lower extremity edema - Primary   Relevant Medications   furosemide (LASIX) 20 MG tablet   potassium chloride SA (KLOR-CON) 20 MEQ tablet   Other Relevant Orders   VAS [place] LOWER EXTREMITY VENOUS (DVT)    Other Visit Diagnoses    Right leg pain       Relevant Orders   VAS  [place] LOWER EXTREMITY VENOUS (DVT)      Follow-up: Return if symptoms worsen or fail to improve.  Wilfred Lacy, NP

## 2020-01-06 ENCOUNTER — Other Ambulatory Visit: Payer: Self-pay

## 2020-01-06 ENCOUNTER — Ambulatory Visit (HOSPITAL_COMMUNITY)
Admission: RE | Admit: 2020-01-06 | Discharge: 2020-01-06 | Disposition: A | Discharge status: Home / Self Care | Payer: BC Managed Care – PPO | Source: Ambulatory Visit | Attending: Nurse Practitioner | Admitting: Nurse Practitioner

## 2020-01-06 ENCOUNTER — Telehealth: Payer: Self-pay

## 2020-01-06 DIAGNOSIS — M79604 Pain in right leg: Secondary | ICD-10-CM

## 2020-01-06 DIAGNOSIS — R600 Localized edema: Secondary | ICD-10-CM | POA: Insufficient documentation

## 2020-01-06 NOTE — Telephone Encounter (Signed)
Thank you. Please inform patient.  

## 2020-01-06 NOTE — Telephone Encounter (Signed)
Pt notified and verbally understood results.

## 2020-01-06 NOTE — Telephone Encounter (Signed)
[facility] called and reported pt was negative for DVT, pt was told to call if she has any problems or concerns.

## 2020-01-13 DIAGNOSIS — M778 Other enthesopathies, not elsewhere classified: Secondary | ICD-10-CM | POA: Insufficient documentation

## 2020-01-13 DIAGNOSIS — M79671 Pain in right foot: Secondary | ICD-10-CM | POA: Insufficient documentation
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
## 2020-02-05 ENCOUNTER — Telehealth (INDEPENDENT_AMBULATORY_CARE_PROVIDER_SITE_OTHER): Payer: BC Managed Care – PPO | Admitting: Nurse Practitioner

## 2020-02-05 ENCOUNTER — Other Ambulatory Visit: Payer: Self-pay

## 2020-02-05 ENCOUNTER — Encounter: Payer: Self-pay | Admitting: Nurse Practitioner

## 2020-02-05 VITALS — BP 121/77 | HR 80 | Temp 97.5°F | Ht 60.0 in | Wt 229.0 lb

## 2020-02-05 DIAGNOSIS — I10 Essential (primary) hypertension: Secondary | ICD-10-CM

## 2020-02-05 DIAGNOSIS — R600 Localized edema: Secondary | ICD-10-CM

## 2020-02-05 MED ORDER — POTASSIUM CHLORIDE CRYS ER 20 MEQ PO TBCR: 20.0000 meq | EXTENDED_RELEASE_TABLET | Freq: Every day | ORAL | 5 refills | Status: DC

## 2020-02-05 MED ORDER — FUROSEMIDE 20 MG PO TABS: ORAL_TABLET | ORAL | 2 refills | Status: DC

## 2020-02-05 MED ORDER — LOSARTAN POTASSIUM 100 MG PO TABS: 100.0000 mg | ORAL_TABLET | Freq: Every day | ORAL | 3 refills | Status: DC

## 2020-02-05 NOTE — Assessment & Plan Note (Signed)
Chronic, worsening despite use of compression socks, low sodium diet and elevation. She denies any leg injury or prolonged immobilization or cough or PND or SOB or chest pain.  maintain DASH diet Discard all previous HCTZ or triamterene prescriptions D/c losartan/HCTZ Start losartan, furosemide and potassium You will be contacted to schedule appt with vascular clinic. F/up in 2weeks (video appt).

## 2020-02-05 NOTE — Patient Instructions (Signed)
maintain DASH diet Discard all previous HCTZ or triamterene prescriptions D/c losartan/HCTZ Start losartan, furosemide and potassium You will be contacted to schedule appt with vascular clinic. F/up in 2weeks (video appt)

## 2020-02-05 NOTE — Progress Notes (Signed)
Virtual Visit via Video Note  I connected with@ on 02/05/20 at 10:00 AM EDT by a video enabled telemedicine application and verified that I am speaking with the correct person using two identifiers.  Location: Patient:Home Provider: Office Participants: patient and provider  I discussed the limitations of evaluation and management by telemedicine and the availability of in person appointments. I also discussed with the patient that there may be a patient responsible charge related to this service. The patient expressed understanding and agreed to proceed.  [id number] extremity still swelling,better at night but toe still sore and swelling/ FYI--Ortho changed the boot but not see much different/ pt used  old rx for triamtrene 75 mg and HCTC 50 mg.   History of Present Illness: Bilateral LE edema L>R: Chronic, worsening despite use of compression socks, low sodium diet and elevation. She denies any leg injury or prolonged immobilization or cough or PND or SOB or chest pain.  BP Readings from Last 3 Encounters:  02/05/20 121/77  01/05/20 120/70  12/21/19 110/80   Wt Readings from Last 3 Encounters:  02/05/20 229 lb (103.9 kg)  01/05/20 227 lb (103 kg)  12/21/19 226 lb 6.4 oz (102.7 kg)   Observations/Objective: Physical Exam  Constitutional: She is oriented to person, place, and time. No distress.  Pulmonary/Chest: Effort normal.  Musculoskeletal:        General: Edema present.  Neurological: She is alert and oriented to person, place, and time.  Vitals reviewed.  Assessment and Plan: Marleyna was seen today for follow-up.  Diagnoses and all orders for this visit:  Bilateral lower extremity edema -     potassium chloride SA (KLOR-CON) 20 MEQ tablet; Take 1 tablet (20 mEq total) by mouth daily. -     furosemide (LASIX) 20 MG tablet; 40mg  daily x 3days, then 20mg  daily continuous -     Ambulatory Referral for Peripheral Vascular Consult  Essential hypertension -     losartan  (COZAAR) 100 MG tablet; Take 1 tablet (100 mg total) by mouth daily.   Follow Up Instructions: maintain DASH diet Discard all previous HCTZ or triamterene prescriptions D/c losartan/HCTZ Start losartan, furosemide and potassium You will be contacted to schedule appt with vascular clinic. F/up in 2weeks (video appt)   I discussed the assessment and treatment plan with the patient. The patient was provided an opportunity to ask questions and all were answered. The patient agreed with the plan and demonstrated an understanding of the instructions.   The patient was advised to call back or seek an in-person evaluation if the symptoms worsen or if the condition fails to improve as anticipated.   Wilfred Lacy, NP

## 2020-02-18 ENCOUNTER — Encounter: Payer: Self-pay | Admitting: Nurse Practitioner

## 2020-02-18 ENCOUNTER — Other Ambulatory Visit: Payer: Self-pay

## 2020-02-18 ENCOUNTER — Other Ambulatory Visit: Payer: BC Managed Care – PPO

## 2020-02-18 ENCOUNTER — Telehealth (INDEPENDENT_AMBULATORY_CARE_PROVIDER_SITE_OTHER): Payer: BC Managed Care – PPO | Admitting: Nurse Practitioner

## 2020-02-18 VITALS — BP 124/81 | HR 76 | Ht 60.0 in | Wt 230.0 lb

## 2020-02-18 DIAGNOSIS — I10 Essential (primary) hypertension: Secondary | ICD-10-CM | POA: Diagnosis not present

## 2020-02-18 DIAGNOSIS — R600 Localized edema: Secondary | ICD-10-CM | POA: Diagnosis not present

## 2020-02-18 LAB — BASIC METABOLIC PANEL
BUN: 16 mg/dL (ref 6–23)
CO2: 27 mEq/L (ref 19–32)
Calcium: 9.2 mg/dL (ref 8.4–10.5)
Chloride: 110 mEq/L (ref 96–112)
Creatinine, Ser: 0.89 mg/dL (ref 0.40–1.20)
GFR: 78.82 mL/min (ref >60.00)
Glucose, Bld: 119 mg/dL — ABNORMAL HIGH (ref 70–99)
Potassium: 3.5 mEq/L (ref 3.5–5.1)
Sodium: 141 mEq/L (ref 135–145)

## 2020-02-18 LAB — BRAIN NATRIURETIC PEPTIDE: Pro B Natriuretic peptide (BNP): 20 pg/mL (ref 0.0–100.0)

## 2020-02-18 NOTE — Addendum Note (Signed)
Addended by: Marrion Coy on: 02/18/2020 10:30 AM   Modules accepted: Orders

## 2020-02-18 NOTE — Patient Instructions (Signed)
Maintain current medications and DASH diet Maintain upcoming appt with cardiology You will be contacted to schedule appt for echocardiogram Go to lab for blood draw today at 11am.

## 2020-02-18 NOTE — Addendum Note (Signed)
Addended by: Marrion Coy on: 02/18/2020 11:12 AM   Modules accepted: Orders

## 2020-02-18 NOTE — Progress Notes (Signed)
Virtual Visit via Video Note  I connected with@ on 02/18/20 at  9:30 AM EDT by a video enabled telemedicine application and verified that I am speaking with the correct person using two identifiers.  Location: Patient:Home Provider: Office Participants: patient and provider  I discussed the limitations of evaluation and management by telemedicine and the availability of in person appointments. I also discussed with the patient that there may be a patient responsible charge related to this service. The patient expressed understanding and agreed to proceed.  CC:HTN and LE edema  History of Present Illness: No improvement with furosemide. Has upcoming appt with cardiology 02/24/2020 Denies any PND or cough or chest pain or palpitation. Has some SOB with exertion. BP Readings from Last 3 Encounters:  02/18/20 124/81  02/05/20 121/77  01/05/20 120/70   Wt Readings from Last 3 Encounters:  02/18/20 230 lb (104.3 kg)  02/05/20 229 lb (103.9 kg)  01/05/20 227 lb (103 kg)   Observations/Objective: Physical Exam  Constitutional: She is oriented to person, place, and time. No distress.  Pulmonary/Chest: Effort normal.  Musculoskeletal:        General: Edema present.  Neurological: She is alert and oriented to person, place, and time.  Skin: No erythema.  Vitals reviewed.  Assessment and Plan: Felicita was seen today for follow-up.  Diagnoses and all orders for this visit:  Bilateral lower extremity edema -     B Nat Peptide -     Basic metabolic panel -     ECHOCARDIOGRAM COMPLETE; Future  Essential hypertension -     Basic metabolic panel -     ECHOCARDIOGRAM COMPLETE; Future   Follow Up Instructions: Maintain current medications and DASH diet Maintain upcoming appt with cardiology You will be contacted to schedule appt for echocardiogram Go to lab for blood draw today at 11am.  I discussed the assessment and treatment plan with the patient. The patient was provided an  opportunity to ask questions and all were answered. The patient agreed with the plan and demonstrated an understanding of the instructions.   The patient was advised to call back or seek an in-person evaluation if the symptoms worsen or if the condition fails to improve as anticipated.  Wilfred Lacy, NP

## 2020-02-24 ENCOUNTER — Encounter: Payer: Self-pay | Admitting: Cardiovascular Disease

## 2020-02-24 ENCOUNTER — Ambulatory Visit (INDEPENDENT_AMBULATORY_CARE_PROVIDER_SITE_OTHER): Payer: BC Managed Care – PPO | Admitting: Cardiovascular Disease

## 2020-02-24 ENCOUNTER — Other Ambulatory Visit: Payer: Self-pay

## 2020-02-24 VITALS — BP 128/64 | HR 76 | Ht 60.0 in | Wt 231.2 lb

## 2020-02-24 DIAGNOSIS — R600 Localized edema: Secondary | ICD-10-CM | POA: Diagnosis not present

## 2020-02-24 NOTE — Patient Instructions (Signed)
Medication Instructions:  NO CHANGE *If you need a refill on your cardiac medications before your next appointment, please call your pharmacy*   Lab Work: If you have labs (blood work) drawn today and your tests are completely normal, you will receive your results only by: Marland Kitchen MyChart Message (if you have MyChart) OR . A paper copy in the mail If you have any lab test that is abnormal or we need to change your treatment, we will call you to review the results.   Follow-Up: At [facility], you and your health needs are our priority.  As part of our continuing mission to provide you with exceptional heart care, we have created designated Provider Care Teams.  These Care Teams include your primary Cardiologist (physician) and Advanced Practice Providers (APPs -  Physician Assistants and Nurse Practitioners) who all work together to provide you with the care you need, when you need it.  We recommend signing up for the patient portal called "MyChart".  Sign up information is provided on this After Visit Summary.  MyChart is used to connect with patients for Virtual Visits (Telemedicine).  Patients are able to view lab/test results, encounter notes, upcoming appointments, etc.  Non-urgent messages can be sent to your provider as well.   To learn more about what you can do with MyChart, go to [email or web address].    Your next appointment:   3 month(s)  The format for your next appointment:   In Person  Provider:   Quay Burow, MD

## 2020-02-24 NOTE — Assessment & Plan Note (Signed)
Mackenzie Weber was referred to me by Dr.Nche for evaluation of bilateral lower extreme edema. She is had this for last several months. It began after hairline fracture of her left foot. She was placed on a diuretic briefly but she did not tolerate this. She denies excessive salt intake. She does have 1-2+ edema bilaterally on exam. She is a 2D echo pending in the next several weeks. I will see her back in 3 months. I suspect she has some diastolic heart failure. If she has continued edema we will try her on a low-dose diuretic.

## 2020-02-24 NOTE — Progress Notes (Signed)
02/24/2020 Mackenzie Weber   [birth year]  [id number]  Primary Physician Mackenzie Weber Primary Cardiologist: Mackenzie Harp MD Mackenzie Weber  HPI:  Mackenzie Weber is a 59 y.o. morbidly overweight married African-American female mother of 2, grandmother of 3 grandchildren is retired from being in the real estate business. She is referred by Mackenzie Weber for evaluation of bilateral lower extreme edema. She does have a history of treated hypertension and diabetes. She is not hyperlipidemic. She is never smoked. She does have a family history of heart disease with mother had bypass surgery and father had a myocardial infarction. She never had a heart attack or stroke. She denies chest pain or shortness of breath. She did have a negative GXT 11/06/2022 exercise tolerance. She is noticed lower extremity edema over the last several months she says which began after she had a hairline fracture of her left foot. She denies excessive salt intake. She was placed briefly on furosemide which she apparently did not tolerate.   Current Meds  Medication Sig  . blood glucose meter kit and supplies KIT Check blood sugar once daily. One Touch Verio Reflect Meter  . EPINEPHrine (EPIPEN JR) 0.15 MG/0.3ML injection Inject 0.15 mg into the muscle daily as needed for anaphylaxis.   Marland Kitchen esomeprazole (NEXIUM) 20 MG capsule Take 1 capsule (20 mg total) by mouth daily before breakfast.  . fluticasone-salmeterol (ADVAIR HFA) 45-21 MCG/ACT inhaler Inhale 2 puffs into the lungs 2 (two) times daily.  . furosemide (LASIX) 20 MG tablet 78m daily x 3days, then 219mdaily continuous  . Lancets (ONETOUCH DELICA PLUS [id number] USE TO TEST BLOOD SUGAR ONCE DAILY  . losartan (COZAAR) 100 MG tablet Take 1 tablet (100 mg total) by mouth daily.  . montelukast (SINGULAIR) 10 MG tablet Take 1 tablet (10 mg total) by mouth at bedtime. FOR SEASONAL ALLERGY  . Multiple Vitamin (MULTIVITAMIN) capsule Take 1 capsule by  mouth daily.  . Glory RosebushERIO test strip USE TO TEST BLOOD SUGAR ONCE DAILY  . potassium chloride SA (KLOR-CON) 20 MEQ tablet Take 1 tablet (20 mEq total) by mouth daily.  . Marland KitchenROAIR HFA 108 (90 Base) MCG/ACT inhaler Inhale 2 puffs into the lungs every 6 (six) hours as needed for wheezing or shortness of breath. Inhale 2 puff into the lungs every 6 hours as needed for wheezing or shortness of breath  . sucralfate (CARAFATE) 1 g tablet sucralfate 1 gram tablet  . traMADol (ULTRAM) 50 MG tablet Take 50 mg by mouth every 4 (four) hours as needed.     Allergies  Allergen Reactions  . Cortisone     Red area around injection site x 50m52m . Depo-Medrol [Methylprednisolone Sodium Succ] Nausea Only and Other (See Comments)    Dizziness  . Aspirin Anxiety and Other (See Comments)    Rapid Heart beat    Social History   Socioeconomic History  . Marital status: Married    Spouse name: Not on file  . Number of children: Not on file  . Years of education: Not on file  . Highest education level: Not on file  Occupational History  . Occupation: retired in 2015 from [employer]  . Smoking status: Never Smoker  . Smokeless tobacco: Never Used  Substance and Sexual Activity  . Alcohol use: No  . Drug use: No  . Sexual activity: Yes    Partners: Male    Birth  control/protection: Surgical  Other Topics Concern  . Not on file  Social History Narrative  . Not on file   Social Determinants of Health   Financial Resource Strain:   . Difficulty of Paying Living Expenses:   Food Insecurity:   . Worried About Charity fundraiser in the Last Year:   . Arboriculturist in the Last Year:   Transportation Needs:   . Film/video editor (Medical):   Marland Kitchen Lack of Transportation (Non-Medical):   Physical Activity:   . Days of Exercise per Week:   . Minutes of Exercise per Session:   Stress:   . Feeling of Stress :   Social Connections:   . Frequency of Communication with  Friends and Family:   . Frequency of Social Gatherings with Friends and Family:   . Attends Religious Services:   . Active Member of Clubs or Organizations:   . Attends Archivist Meetings:   Marland Kitchen Marital Status:   Intimate Partner Violence:   . Fear of Current or Ex-Partner:   . Emotionally Abused:   Marland Kitchen Physically Abused:   . Sexually Abused:      Review of Systems: General: negative for chills, fever, night sweats or weight changes.  Cardiovascular: negative for chest pain, dyspnea on exertion, edema, orthopnea, palpitations, paroxysmal nocturnal dyspnea or shortness of breath Dermatological: negative for rash Respiratory: negative for cough or wheezing Urologic: negative for hematuria Abdominal: negative for nausea, vomiting, diarrhea, bright red blood per rectum, melena, or hematemesis Neurologic: negative for visual changes, syncope, or dizziness All other systems reviewed and are otherwise negative except as noted above.    Blood pressure 128/64, pulse 76, height 5' (1.524 m), weight 231 lb 3.2 oz (104.9 kg).  General appearance: alert and no distress Neck: no adenopathy, no carotid bruit, no JVD, supple, symmetrical, trachea midline and thyroid not enlarged, symmetric, no tenderness/mass/nodules Lungs: clear to auscultation bilaterally Heart: regular rate and rhythm, S1, S2 normal, no murmur, click, rub or gallop Extremities: 1-2+ pitting edema bilaterally Pulses: 2+ and symmetric Skin: Skin color, texture, turgor normal. No rashes or lesions Neurologic: Alert and oriented X 3, normal strength and tone. Normal symmetric reflexes. Normal coordination and gait  EKG sinus rhythm at 76 with nonspecific ST and T wave changes. Personally reviewed this EKG.  ASSESSMENT AND PLAN:   Hypertensive disorder History of essential hypertension on losartan with blood pressure measured today 120/64.  Bilateral lower extremity edema Mackenzie Weber was referred to me by Mackenzie Weber for  evaluation of bilateral lower extreme edema. She is had this for last several months. It began after hairline fracture of her left foot. She was placed on a diuretic briefly but she did not tolerate this. She denies excessive salt intake. She does have 1-2+ edema bilaterally on exam. She is a 2D echo pending in the next several weeks. I will see her back in 3 months. I suspect she has some diastolic heart failure. If she has continued edema we will try her on a low-dose diuretic.      Mackenzie Harp MD FACP,FACC,FAHA, [facility] 02/24/2020 10:32 AM

## 2020-02-24 NOTE — Assessment & Plan Note (Signed)
History of essential hypertension on losartan with blood pressure measured today 120/64.

## 2020-03-10 ENCOUNTER — Other Ambulatory Visit: Payer: Self-pay | Admitting: Nurse Practitioner

## 2020-03-10 DIAGNOSIS — I10 Essential (primary) hypertension: Secondary | ICD-10-CM

## 2020-03-11 ENCOUNTER — Telehealth: Payer: Self-pay | Admitting: Nurse Practitioner

## 2020-03-11 NOTE — Telephone Encounter (Signed)
Patient is calling and is requesting that her losartan be changed back to the original losartan. Patient stated that when she takes medication she doesn't feel right. CB is [phone]

## 2020-03-15 ENCOUNTER — Telehealth: Payer: Self-pay | Admitting: Cardiovascular Disease

## 2020-03-15 ENCOUNTER — Other Ambulatory Visit: Payer: Self-pay

## 2020-03-15 ENCOUNTER — Ambulatory Visit (HOSPITAL_COMMUNITY): Payer: BC Managed Care – PPO | Attending: Cardiology

## 2020-03-15 DIAGNOSIS — I10 Essential (primary) hypertension: Secondary | ICD-10-CM | POA: Insufficient documentation

## 2020-03-15 DIAGNOSIS — R600 Localized edema: Secondary | ICD-10-CM | POA: Diagnosis present

## 2020-03-15 NOTE — Telephone Encounter (Signed)
Charlotte please advise.  Left voicemail for pt to call back. She called stating she thinks the Losartan is making her feel funny and wanted to go back to the other dosage and kind. Pt wasn't reached to see where her BP readings have been.

## 2020-03-15 NOTE — Telephone Encounter (Signed)
The only medication change that was made was losartan/HCTZ to losartan. Therefore losartan is not a new medication Need clarification on what she is experiencing or what she is referring to.

## 2020-03-15 NOTE — Telephone Encounter (Signed)
Patient had an echo done at her PCP's office. The nurse that called the patient with the results from that office could not explain to her what the results meant. She wants to know if Dr. Kennon Holter nurse would be able to discuss this with her. Please advise.

## 2020-03-16 NOTE — Telephone Encounter (Signed)
Pt was notified of echo results.   FYI-result note was sent on this as well. Pt is wondering if her asthma symptoms(wheezing and using her inhaler more) she's experiencing is due to Losartan side effects.

## 2020-03-16 NOTE — Telephone Encounter (Signed)
Pt was notified and verbally understood better her results.

## 2020-03-18 ENCOUNTER — Encounter: Payer: Self-pay | Admitting: Nurse Practitioner

## 2020-03-18 ENCOUNTER — Other Ambulatory Visit: Payer: Self-pay

## 2020-03-18 ENCOUNTER — Telehealth (INDEPENDENT_AMBULATORY_CARE_PROVIDER_SITE_OTHER): Payer: BC Managed Care – PPO | Admitting: Nurse Practitioner

## 2020-03-18 VITALS — BP 146/87 | HR 70 | Temp 97.4°F | Ht 60.0 in | Wt 229.0 lb

## 2020-03-18 DIAGNOSIS — I10 Essential (primary) hypertension: Secondary | ICD-10-CM | POA: Diagnosis not present

## 2020-03-18 DIAGNOSIS — I5189 Other ill-defined heart diseases: Secondary | ICD-10-CM | POA: Insufficient documentation

## 2020-03-18 DIAGNOSIS — J4530 Mild persistent asthma, uncomplicated: Secondary | ICD-10-CM | POA: Diagnosis not present

## 2020-03-18 DIAGNOSIS — I519 Heart disease, unspecified: Secondary | ICD-10-CM | POA: Diagnosis not present

## 2020-03-18 NOTE — Patient Instructions (Addendum)
I again advised her against polypharmacy. I advised for her to discard all medication not on her current medication list. I do not think her symptoms are related to furosemide or losartan due to inconsistent compliance. With proper medication compliance, we will be able to determine if medication vs Asthma exacerbation is an issue. She is to continue Symbicort BID (rinse mouth after each use), use albuterol prn for SOB/chest tightness/cough, resume montelukast at HS, continue losartan, furosemide and potassium. I entered referral to another ENT for asthma and allergy management. I also answered her questions about echocardiogram report. Explained about need to maintain proper BP control, DASH diet, and weight loss. F/up in 68month (F2F).

## 2020-03-18 NOTE — Assessment & Plan Note (Signed)
Echo completed 02/2020: normal EF Chronic LE edema Current use of losartan and furosemide. She had abnormal PFT in 2016: obstructive and restrictive? Last OV with pulmonology 12/2014: Dr. Melvyn Novas. Will discuss need for sleep study and/or referral to pulmonology if persistent LE edema and wheezing. F/up in 46month (F2F)

## 2020-03-18 NOTE — Progress Notes (Signed)
Virtual Visit via Video Note  I connected with@ on 03/18/20 at  9:30 AM EDT by a video enabled telemedicine application and verified that I am speaking with the correct person using two identifiers.  Location: Patient:Home Provider: Office Participants: patient and provider  I discussed the limitations of evaluation and management by telemedicine and the availability of in person appointments. I also discussed with the patient that there may be a patient responsible charge related to this service. The patient expressed understanding and agreed to proceed.  RS:WNIOEVO echocardiogram results//pt also was gonna talk about asthma related symptoms and wheezing//using inhalers more in last few weeks than in a year//had eye exam Feb or March with John Lindsey//no Pnuemonia shot  History of Present Illness: Ms. Lykins expresses concerns about current medications and echocardiogram findings.   Asthma: Intermittent wheezing in last 2weeks. She thinks it might be related to losartan use. She is not sure if wheezing to from chest or throat or nasal. She denies any chest tightness and cough. She has chronic nasal and sinus congestion, followed by [facility] allergist.She also take allergy injection. She states she is not pleased with their care. She Admits to inconsistency with medication use: symbicort, montelukast and albuterol. Last week she started using symbicort and albuterol daily, unable to tell change in her symptoms.  HTN: Elevated reading today. Admits to medication inconsistency. Losartan/HCTZ was changed to losartan in March in order to add furosemide for LE edema. She reports she was not comfortable with new medications, so she continued taking losartan/HCTZ. She also admits to taking triamterene twice in last 75months in order to manage LE edema. She is now out of losartan/HCTZ, so she started losartan and furosemide 1week ago. Unable to tell difference in her symptoms. BP Readings from Last 3  Encounters:  03/18/20 (!) 146/87  02/24/20 128/64  02/18/20 124/81   Wt Readings from Last 3 Encounters:  03/18/20 229 lb (103.9 kg)  02/24/20 231 lb 3.2 oz (104.9 kg)  02/18/20 230 lb (104.3 kg)   Echocardiogram report: Grade I Diastolic Dysfunction with normal EF She wants to know cause of diastolic dysfunction. She had abnormal PFT in 2016: obstructive and restrictive? Last OV with pulmonology 12/2014: Dr. Melvyn Novas.  Observations/Objective: Physical Exam  Constitutional: She is oriented to person, place, and time. No distress.  Cardiovascular: Normal rate.  Pulmonary/Chest: Effort normal.  Neurological: She is alert and oriented to person, place, and time.  Vitals reviewed.  Assessment and Plan: Mazal was seen today for follow-up.  Diagnoses and all orders for this visit:  Essential hypertension  Mild persistent asthma with allergic rhinitis   -     Ambulatory referral to Allergy  Grade I diastolic dysfunction   Follow Up Instructions: I again advise her against polypharmacy. I advised for her to discard all medication not on her current medication list. I do not think her symptoms are related to furosemide or losartan due to inconsistent compliance. With proper medication compliance, we will be able to determine if medication vs Asthma exacerbation is an issue. She is to continue symbicort BID (rinse mouth after each use), use albuterol prn for SOB/chest tightness/cough, resume montelukast at HS, continue losartan, furosemide and potassium. I entered referral to another ENT for asthma and allergy management. I also answered her questions about echocardiogram report. Explained about need to maintain proper BP control, DASH diet, and weight loss. F/up in 83month (F2F). Will discuss need for sleep study and/or referral to pulmonology if persistent LE edema and wheezing.  I discussed the assessment and treatment plan with the patient. The patient was provided an opportunity to  ask questions and all were answered. The patient agreed with the plan and demonstrated an understanding of the instructions.   The patient was advised to call back or seek an in-person evaluation if the symptoms worsen or if the condition fails to improve as anticipated.  Wilfred Lacy, NP

## 2020-03-30 ENCOUNTER — Telehealth: Payer: Self-pay | Admitting: Nurse Practitioner

## 2020-03-30 DIAGNOSIS — I10 Essential (primary) hypertension: Secondary | ICD-10-CM

## 2020-03-30 DIAGNOSIS — R600 Localized edema: Secondary | ICD-10-CM

## 2020-03-30 NOTE — Telephone Encounter (Signed)
Patient called to tell Mackenzie Weber her BP readings are 166/101 (9:20am) and 151/93 (9:30am) this morning. Patient was also transferred to the triage nurse line.

## 2020-03-30 NOTE — Telephone Encounter (Signed)
Pt was contacted and stated that she took her medications an hour after her dr visits that had the high readings and I got ahold of her at 4:38pm on 03/30/20 and she took it an it was still 164/90.  Pt stated she is worried about it and mentioned going back to her old BP medicine and if she should take 2 water pills together to help BP. She said shes not sure what to do to bring it down.

## 2020-03-30 NOTE — Telephone Encounter (Signed)
Inquire what her BP is after BP medications

## 2020-03-30 NOTE — Telephone Encounter (Signed)
Charlotte please advise.  Pt called in concerned about her high BP readings. She was transferred to triage line.  9:20am   166/101 9:30am   151/93

## 2020-03-31 ENCOUNTER — Telehealth: Payer: Self-pay | Admitting: Nurse Practitioner

## 2020-03-31 MED ORDER — AMLODIPINE BESYLATE 5 MG PO TABS: 5.0000 mg | ORAL_TABLET | Freq: Every day | ORAL | 1 refills | Status: DC

## 2020-03-31 NOTE — Telephone Encounter (Signed)
I do not recommend that medication change. My recommendation is to take furosemide, losartan and amlodipine

## 2020-03-31 NOTE — Telephone Encounter (Signed)
Maintain current dose of furosemide and losartan. Start amlodipine at bedtime. I entered referral to neurology to discuss eval for sleep apnea. F/up with me in 2weeks (F2F)

## 2020-03-31 NOTE — Telephone Encounter (Signed)
Patient would like a call back about her BP again. States it is 164/91 this morning.

## 2020-03-31 NOTE — Telephone Encounter (Signed)
Charlotte please advise.  Pt was contacted and understood. Pt stated she can't go to neurology right now that she can't afford it. She stated she had a 600 bill now to [facility]. Pt also is asking if she can go back to Losartan Hydrochloroziade and Triamterne hydrochloroziade even if its on a trial for 7 days to see if this would help her BP. Pt feels this helped her more and her body responded better than the new meds. Pt states her BP today after waking up was 164/91.

## 2020-03-31 NOTE — Addendum Note (Signed)
Addended by: Wilfred Lacy L on: 03/31/2020 09:11 AM   Modules accepted: Orders

## 2020-03-31 NOTE — Telephone Encounter (Signed)
[place] contacted and verbally understood to pick up rx. Pt undertstood Charlottes advise but did not want to schedule a follow up and mentioned finding a new dr.

## 2020-04-13 ENCOUNTER — Telehealth: Payer: Self-pay | Admitting: Nurse Practitioner

## 2020-04-13 NOTE — Telephone Encounter (Signed)
Pt scheduled for 1:30pm 04/14/20.

## 2020-04-13 NOTE — Telephone Encounter (Signed)
Charlotte please advise.  Pt stated that her BP this morn was 154/93 an she said shes been taking all 3 pills(furosmide, Losartan, and Amlodipine) for the last 2 weeks like you said an the lowest her BP has been is 144/91 on the new medication.Pt still asking for Losartan Hydrochloroziade and Triamterne hydrochloroziade, she thinks her BP was managed better on those medications.

## 2020-04-13 NOTE — Telephone Encounter (Signed)
Patient is calling and stated that she started a new medication and  wanted to let Baldo Ash know that her BP was 154/93 this morning and requesting a call back from the nurse. CB is [phone]

## 2020-04-13 NOTE — Telephone Encounter (Signed)
She needs to schedule F46f appt with me

## 2020-04-14 ENCOUNTER — Other Ambulatory Visit: Payer: Self-pay

## 2020-04-14 ENCOUNTER — Encounter: Payer: Self-pay | Admitting: Nurse Practitioner

## 2020-04-14 ENCOUNTER — Ambulatory Visit: Payer: BC Managed Care – PPO | Admitting: Nurse Practitioner

## 2020-04-14 VITALS — BP 138/72 | HR 73 | Temp 97.2°F | Ht 60.0 in | Wt 229.4 lb

## 2020-04-14 DIAGNOSIS — R600 Localized edema: Secondary | ICD-10-CM | POA: Diagnosis not present

## 2020-04-14 DIAGNOSIS — I10 Essential (primary) hypertension: Secondary | ICD-10-CM

## 2020-04-14 MED ORDER — TRIAMTERENE-HCTZ 75-50 MG PO TABS: 0.5000 | ORAL_TABLET | Freq: Every day | ORAL | 1 refills | Status: DC

## 2020-04-14 NOTE — Patient Instructions (Signed)
Per your request, discontinue furosemide, amlodipine and potassium. Continue losartan Resume triamterene/HCTZ half tab daily  Check BP 2-3x/week in AM only.

## 2020-04-14 NOTE — Assessment & Plan Note (Signed)
Reports financial difficulty, hence unable to schedule appt with neurology for possible sleep study. BP at goal with amlodipine and losartan No improvement in LE edema with furosemide. She wishes to resume losartan and maxzide only BP Readings from Last 3 Encounters:  04/14/20 138/72  03/18/20 (!) 146/87  02/24/20 128/64   Medication changes made per patient's request. F/up in 11month Advised to maintain appt with cardiology in August.

## 2020-04-14 NOTE — Progress Notes (Signed)
Subjective:  Patient ID: Mackenzie Weber, female    DOB: Apr 24, 1961  Age: 59 y.o. MRN: [id number]  CC: Follow-up (HTN discussion-BP readings 154/93 this morning states shes taking meds as directed//No pnuemonia shot today//eye exam this year March with my eye dr-Dr Mendel Ryder)  HPI HTN: BP at goal Persistent LE edema with furosemide BP Readings from Last 3 Encounters:  04/14/20 138/72  03/18/20 (!) 146/87  02/24/20 128/64   She is requesting to resume losartan and maxzide.  Reviewed past Medical, Social and Family history today.  Outpatient Medications Prior to Visit  Medication Sig Dispense Refill   blood glucose meter kit and supplies KIT Check blood sugar once daily. One Touch Verio Reflect Meter 1 each 0   EPINEPHrine (EPIPEN JR) 0.15 MG/0.3ML injection Inject 0.15 mg into the muscle daily as needed for anaphylaxis.      esomeprazole (NEXIUM) 20 MG capsule Take 1 capsule (20 mg total) by mouth daily before breakfast. 90 capsule 1   fluticasone-salmeterol (ADVAIR HFA) 45-21 MCG/ACT inhaler Inhale 2 puffs into the lungs 2 (two) times daily. 1 Inhaler 0   Lancets (ONETOUCH DELICA PLUS [id number]) MISC USE TO TEST BLOOD SUGAR ONCE DAILY     losartan (COZAAR) 100 MG tablet Take 1 tablet (100 mg total) by mouth daily. 90 tablet 3   montelukast (SINGULAIR) 10 MG tablet Take 1 tablet (10 mg total) by mouth at bedtime. FOR SEASONAL ALLERGY 90 tablet 3   Multiple Vitamin (MULTIVITAMIN) capsule Take 1 capsule by mouth daily.     ONETOUCH VERIO test strip USE TO TEST BLOOD SUGAR ONCE DAILY     PROAIR HFA 108 (90 Base) MCG/ACT inhaler Inhale 2 puffs into the lungs every 6 (six) hours as needed for wheezing or shortness of breath. Inhale 2 puff into the lungs every 6 hours as needed for wheezing or shortness of breath 18 g 5   sucralfate (CARAFATE) 1 g tablet sucralfate 1 gram tablet     amLODipine (NORVASC) 5 MG tablet Take 1 tablet (5 mg total) by mouth at bedtime. 90 tablet 1    furosemide (LASIX) 20 MG tablet 76m daily x 3days, then 216mdaily continuous 60 tablet 2   potassium chloride SA (KLOR-CON) 20 MEQ tablet Take 1 tablet (20 mEq total) by mouth daily. 30 tablet 5   traMADol (ULTRAM) 50 MG tablet Take 50 mg by mouth every 4 (four) hours as needed. (Patient not taking: Reported on 04/14/2020)     No facility-administered medications prior to visit.    ROS See HPI  Objective:  BP 138/72    Pulse 73    Temp (!) 97.2 F (36.2 C) (Tympanic)    Ht 5' (1.524 m)    Wt 229 lb 6.4 oz (104.1 kg)    SpO2 97%    BMI 44.80 kg/m   BP Readings from Last 3 Encounters:  04/14/20 138/72  03/18/20 (!) 146/87  02/24/20 128/64    Wt Readings from Last 3 Encounters:  04/14/20 229 lb 6.4 oz (104.1 kg)  03/18/20 229 lb (103.9 kg)  02/24/20 231 lb 3.2 oz (104.9 kg)    Physical Exam Vitals reviewed.  Constitutional:      Appearance: She is obese.  Cardiovascular:     Rate and Rhythm: Normal rate.     Pulses: Normal pulses.  Musculoskeletal:     Right lower leg: Edema present.     Left lower leg: Edema present.  Neurological:     Mental Status: She  is alert and oriented to person, place, and time.    Lab Results  Component Value Date   WBC 6.7 03/18/2018   HGB 11.6 (L) 03/18/2018   HCT 38.0 03/18/2018   PLT 340 03/18/2018   GLUCOSE 119 (H) 02/18/2020   CHOL 161 03/11/2019   TRIG 54 03/11/2019   HDL 63 03/11/2019   LDLCALC 87 03/11/2019   ALT 10 03/11/2019   AST 14 03/11/2019   NA 141 02/18/2020   K 3.5 02/18/2020   CL 110 02/18/2020   CREATININE 0.89 02/18/2020   BUN 16 02/18/2020   CO2 27 02/18/2020   TSH 2.84 01/01/2019   HGBA1C 6.9 (H) 12/21/2019   MICROALBUR <0.7 01/01/2019    VAS [place] LOWER EXTREMITY VENOUS (DVT)  Result Date: 01/06/2020  Lower Venous DVTStudy Indications: Right ankle pain x 2 weeks  Risk Factors: None identified. Performing Technologist: Ronal Fear RVS, RCS  Examination Guidelines: A complete evaluation includes B-mode  imaging, spectral Doppler, color Doppler, and power Doppler as needed of all accessible portions of each vessel. Bilateral testing is considered an integral part of a complete examination. Limited examinations for reoccurring indications may be performed as noted. The reflux portion of the exam is performed with the patient in reverse Trendelenburg.  +---------+---------------+---------+-----------+----------+--------------+  RIGHT     Compressibility Phasicity Spontaneity Properties Thrombus Aging  +---------+---------------+---------+-----------+----------+--------------+  CFV       Full            Yes                                              +---------+---------------+---------+-----------+----------+--------------+  SFJ       Full                                                             +---------+---------------+---------+-----------+----------+--------------+  FV Prox   Full            Yes                                              +---------+---------------+---------+-----------+----------+--------------+  FV Mid    Full            Yes                                              +---------+---------------+---------+-----------+----------+--------------+  FV Distal Full            Yes                                              +---------+---------------+---------+-----------+----------+--------------+  POP       Full            Yes                                              +---------+---------------+---------+-----------+----------+--------------+  PTV       Full                                                             +---------+---------------+---------+-----------+----------+--------------+  PERO      Full                                                             +---------+---------------+---------+-----------+----------+--------------+  GSV       Full                                                             +---------+---------------+---------+-----------+----------+--------------+   SSV       Full                                                             +---------+---------------+---------+-----------+----------+--------------+    Findings reported to Christy at 11:33 am.  Summary: RIGHT: - There is no evidence of deep vein thrombosis in the lower extremity. - There is no evidence of superficial venous thrombosis. - No cystic structure found in the popliteal fossa.  LEFT: - No evidence of common femoral vein obstruction.  *See table(s) above for measurements and observations. Electronically signed by Deitra Mayo MD on 01/06/2020 at 12:38:47 PM.    Final     Assessment & Plan:  This visit occurred during the SARS-CoV-2 public health emergency.  Safety protocols were in place, including screening questions prior to the visit, additional usage of staff PPE, and extensive cleaning of exam room while observing appropriate contact time as indicated for disinfecting solutions.   Mackenzie Weber was seen today for follow-up.  Diagnoses and all orders for this visit:  Essential hypertension -     triamterene-hydrochlorothiazide (MAXZIDE) 75-50 MG tablet; Take 0.5 tablets by mouth daily.  Bilateral lower extremity edema -     triamterene-hydrochlorothiazide (MAXZIDE) 75-50 MG tablet; Take 0.5 tablets by mouth daily.   I have discontinued Illa Enlow. Ho's potassium chloride SA, furosemide, and amLODipine. I am also having her start on triamterene-hydrochlorothiazide. Additionally, I am having her maintain her EPINEPHrine, multivitamin, ProAir HFA, sucralfate, blood glucose meter kit and supplies, Advair HFA, traMADol, OneTouch Delica Plus [id number], OneTouch Verio, montelukast, esomeprazole, and losartan.  Meds ordered this encounter  Medications   triamterene-hydrochlorothiazide (MAXZIDE) 75-50 MG tablet    Sig: Take 0.5 tablets by mouth daily.    Dispense:  45 tablet    Refill:  1    D/c furosemide and amlodipine per patient's request    Order Specific Question:    Supervising Provider    Answer:   Ronnald Nian [[id number]]    Problem List Items Addressed This Visit      Cardiovascular and  Mediastinum   Hypertensive disorder - Primary   Relevant Medications   triamterene-hydrochlorothiazide (MAXZIDE) 75-50 MG tablet     Other   Bilateral lower extremity edema   Relevant Medications   triamterene-hydrochlorothiazide (MAXZIDE) 75-50 MG tablet      Follow-up: Return in about 4 weeks (around 05/12/2020) for HTN and edema.  Wilfred Lacy, NP

## 2020-04-28 ENCOUNTER — Ambulatory Visit: Payer: BC Managed Care – PPO | Admitting: Allergy and Immunology

## 2020-05-10 ENCOUNTER — Other Ambulatory Visit: Payer: Self-pay

## 2020-05-11 ENCOUNTER — Ambulatory Visit: Payer: BC Managed Care – PPO | Admitting: Allergy

## 2020-05-11 ENCOUNTER — Encounter: Payer: Self-pay | Admitting: Nurse Practitioner

## 2020-05-11 ENCOUNTER — Ambulatory Visit (INDEPENDENT_AMBULATORY_CARE_PROVIDER_SITE_OTHER): Payer: BC Managed Care – PPO | Admitting: Nurse Practitioner

## 2020-05-11 VITALS — BP 124/64 | HR 87 | Temp 98.6°F | Ht 60.0 in | Wt 222.0 lb

## 2020-05-11 DIAGNOSIS — I10 Essential (primary) hypertension: Secondary | ICD-10-CM

## 2020-05-11 DIAGNOSIS — R600 Localized edema: Secondary | ICD-10-CM

## 2020-05-11 NOTE — Assessment & Plan Note (Signed)
Stable BP and improved LE edema with losartan and maxzide. BP Readings from Last 3 Encounters:  05/11/20 (!) 124/64  04/14/20 138/72  03/18/20 (!) 146/87   Continue current medications F/up in 77months

## 2020-05-11 NOTE — Patient Instructions (Signed)
Maintain current medications  Continue DASH diet, and daily exercise.

## 2020-05-11 NOTE — Progress Notes (Signed)
Subjective:  Patient ID: Mackenzie Weber, female    DOB: 08-17-1961  Age: 59 y.o. MRN: [id number]  CC: Follow-up (4 week follow up on BP and edema in ankles and legs. No concerns. )  HPI  Hypertensive disorder Stable BP and improved LE edema with losartan and maxzide. BP Readings from Last 3 Encounters:  05/11/20 (!) 124/64  04/14/20 138/72  03/18/20 (!) 146/87   Continue current medications F/up in 23month  BP Readings from Last 3 Encounters:  05/11/20 (!) 124/64  04/14/20 138/72  03/18/20 (!) 146/87   Wt Readings from Last 3 Encounters:  05/11/20 (!) 222 lb (100.7 kg)  04/14/20 229 lb 6.4 oz (104.1 kg)  03/18/20 229 lb (103.9 kg)   Reviewed past Medical, Social and Family history today.  Outpatient Medications Prior to Visit  Medication Sig Dispense Refill  . blood glucose meter kit and supplies KIT Check blood sugar once daily. One Touch Verio Reflect Meter 1 each 0  . EPINEPHrine (EPIPEN JR) 0.15 MG/0.3ML injection Inject 0.15 mg into the muscle daily as needed for anaphylaxis.     .Marland Kitchenesomeprazole (NEXIUM) 20 MG capsule Take 1 capsule (20 mg total) by mouth daily before breakfast. 90 capsule 1  . fluticasone-salmeterol (ADVAIR HFA) 45-21 MCG/ACT inhaler Inhale 2 puffs into the lungs 2 (two) times daily. 1 Inhaler 0  . Lancets (ONETOUCH DELICA PLUS [id number] MISC USE TO TEST BLOOD SUGAR ONCE DAILY    . losartan (COZAAR) 100 MG tablet Take 1 tablet (100 mg total) by mouth daily. 90 tablet 3  . montelukast (SINGULAIR) 10 MG tablet Take 1 tablet (10 mg total) by mouth at bedtime. FOR SEASONAL ALLERGY 90 tablet 3  . Multiple Vitamin (MULTIVITAMIN) capsule Take 1 capsule by mouth daily.    .Glory RosebushVERIO test strip USE TO TEST BLOOD SUGAR ONCE DAILY    . PROAIR HFA 108 (90 Base) MCG/ACT inhaler Inhale 2 puffs into the lungs every 6 (six) hours as needed for wheezing or shortness of breath. Inhale 2 puff into the lungs every 6 hours as needed for wheezing or shortness of  breath 18 g 5  . triamterene-hydrochlorothiazide (MAXZIDE) 75-50 MG tablet Take 0.5 tablets by mouth daily. 45 tablet 1  . estradiol (ESTRACE) 0.1 MG/GM vaginal cream Place vaginally.    . sucralfate (CARAFATE) 1 g tablet sucralfate 1 gram tablet (Patient not taking: Reported on 05/11/2020)    . traMADol (ULTRAM) 50 MG tablet Take 50 mg by mouth every 4 (four) hours as needed. (Patient not taking: Reported on 05/11/2020)     No facility-administered medications prior to visit.    ROS See HPI  Objective:  BP (!) 124/64   Pulse 87   Temp 98.6 F (37 C) (Tympanic)   Ht 5' (1.524 m)   Wt (!) 222 lb (100.7 kg)   SpO2 95%   BMI 43.36 kg/m   Physical Exam Constitutional:      Appearance: She is obese.  Cardiovascular:     Rate and Rhythm: Normal rate and regular rhythm.     Pulses: Normal pulses.     Heart sounds: Normal heart sounds.  Pulmonary:     Effort: Pulmonary effort is normal.     Breath sounds: Normal breath sounds.  Musculoskeletal:     Right lower leg: Edema present.     Left lower leg: Edema present.  Skin:    Findings: No erythema or rash.     Comments: 1+ pitting edema  Neurological:  Mental Status: She is alert and oriented to person, place, and time.    Assessment & Plan:  This visit occurred during the SARS-CoV-2 public health emergency.  Safety protocols were in place, including screening questions prior to the visit, additional usage of staff PPE, and extensive cleaning of exam room while observing appropriate contact time as indicated for disinfecting solutions.   [name] was seen today for follow-up.  Diagnoses and all orders for this visit:  Essential hypertension  Bilateral lower extremity edema    Problem List Items Addressed This Visit      Cardiovascular and Mediastinum   Hypertensive disorder - Primary    Stable BP and improved LE edema with losartan and maxzide. BP Readings from Last 3 Encounters:  05/11/20 (!) 124/64  04/14/20  138/72  03/18/20 (!) 146/87   Continue current medications F/up in 85month        Other   Bilateral lower extremity edema      Follow-up: Return in about 3 months (around 08/11/2020) for CPE (fasting).  CWilfred Lacy NP

## 2020-05-30 ENCOUNTER — Encounter: Payer: Self-pay | Admitting: Allergy and Immunology

## 2020-05-30 ENCOUNTER — Ambulatory Visit: Payer: BC Managed Care – PPO | Admitting: Allergy and Immunology

## 2020-05-30 ENCOUNTER — Other Ambulatory Visit: Payer: Self-pay

## 2020-05-30 VITALS — BP 104/74 | HR 64 | Temp 97.4°F | Resp 16 | Ht 59.5 in | Wt 226.0 lb

## 2020-05-30 DIAGNOSIS — H1010 Acute atopic conjunctivitis, unspecified eye: Secondary | ICD-10-CM | POA: Insufficient documentation

## 2020-05-30 DIAGNOSIS — J3089 Other allergic rhinitis: Secondary | ICD-10-CM | POA: Diagnosis not present

## 2020-05-30 DIAGNOSIS — J4540 Moderate persistent asthma, uncomplicated: Secondary | ICD-10-CM | POA: Diagnosis not present

## 2020-05-30 DIAGNOSIS — H1013 Acute atopic conjunctivitis, bilateral: Secondary | ICD-10-CM

## 2020-05-30 MED ORDER — LEVOCETIRIZINE DIHYDROCHLORIDE 5 MG PO TABS
5.0000 mg | ORAL_TABLET | Freq: Every day | ORAL | 5 refills | Status: DC | PRN
Start: 2020-05-30 — End: 2020-11-16

## 2020-05-30 MED ORDER — OLOPATADINE HCL 0.2 % OP SOLN
1.0000 [drp] | Freq: Every day | OPHTHALMIC | 5 refills | Status: DC | PRN
Start: 2020-05-30 — End: 2021-10-02

## 2020-05-30 MED ORDER — AZELASTINE HCL 0.1 % NA SOLN: NASAL | 5 refills | Status: DC

## 2020-05-30 NOTE — Assessment & Plan Note (Signed)
   Treatment plan as outlined above for allergic rhinitis.  A prescription has been provided for generic Pataday, one drop per eye daily as needed.  If insurance does not cover this medication, medicated allergy eyedrops may be purchased over-the-counter as Pataday Extra Strength or Zaditor.  I have also recommended eye lubricant drops (i.e., Natural Tears) as needed. 

## 2020-05-30 NOTE — Assessment & Plan Note (Signed)
Currently well controlled.  For now, continue Advair 45-21 g, 2 inhalations via spacer device twice daily.  Continue montelukast 10 mg daily at bedtime.  Continue albuterol HFA, 1 to 2 inhalations every 4-6 hours if needed.

## 2020-05-30 NOTE — Assessment & Plan Note (Addendum)
   Aeroallergen avoidance measures have been discussed and provided in written form.  A prescription has been provided for levocetirizine(Xyzal), 5 mg daily as needed.  To avoid diminishing benefit with daily use (tachyphylaxis) of second generation antihistamine, consider alternating every few months between fexofenadine (Allegra) and levocetirizine (Xyzal).  A prescription has been provided for azelastine nasal spray, 1-2 sprays per nostril 2 times daily as needed. Proper nasal spray technique has been discussed and demonstrated.   Nasal saline spray (i.e., Simply Saline) or nasal saline lavage (i.e., NeilMed) is recommended as needed and prior to medicated nasal sprays.  The risks and benefits of aeroallergen immunotherapy have been discussed. The patient is motivated to initiate immunotherapy to reduce symptoms and decrease medication requirement. Informed consent has been signed and allergen vaccine orders have been submitted. Medications will be decreased or discontinued as symptom relief from immunotherapy becomes evident.

## 2020-05-30 NOTE — Progress Notes (Signed)
New Patient Note  RE: [name] MRN: [id number] DOB: 01-Mar-1961 Date of Office Visit: 05/30/2020  Referring provider: Flossie Buffy, NP Primary care provider: Flossie Buffy, NP  Chief Complaint: Allergic Rhinitis   History of present illness: Mackenzie Weber is a 59 y.o. female seen today in consultation requested by Flossie Buffy, NP.  She reports that she has been taking immunotherapy injections from an otolaryngologist since 1997.  She has noted some degree of improvement, however still experiences nasal congestion, rhinorrhea, and postnasal drainage.  She reports that she is still "highly allergic" to grass pollen, dust, dust mites, dog, and cats. She reports that she has had symptoms consistent with asthma since she was 72 or 59 years of age.  She is currently taking Advair 45-21 g, 2 inhalations via spacer twice twice daily, and montelukast 10 mg daily.  She reports that while taking this regimen she rarely requires albuterol rescue and does not experience limitations in normal daily activities or nocturnal awakenings due to lower respiratory symptoms.  Assessment and plan: Perennial and seasonal allergic rhinitis  Aeroallergen avoidance measures have been discussed and provided in written form.  A prescription has been provided for levocetirizine(Xyzal), 5 mg daily as needed.  To avoid diminishing benefit with daily use (tachyphylaxis) of second generation antihistamine, consider alternating every few months between fexofenadine (Allegra) and levocetirizine (Xyzal).  A prescription has been provided for azelastine nasal spray, 1-2 sprays per nostril 2 times daily as needed. Proper nasal spray technique has been discussed and demonstrated.   Nasal saline spray (i.e., Simply Saline) or nasal saline lavage (i.e., NeilMed) is recommended as needed and prior to medicated nasal sprays.  The risks and benefits of aeroallergen immunotherapy have been discussed. The  patient is motivated to initiate immunotherapy to reduce symptoms and decrease medication requirement. Informed consent has been signed and allergen vaccine orders have been submitted. Medications will be decreased or discontinued as symptom relief from immunotherapy becomes evident.  Allergic conjunctivitis  Treatment plan as outlined above for allergic rhinitis.  A prescription has been provided for generic Pataday, one drop per eye daily as needed.  If insurance does not cover this medication, medicated allergy eyedrops may be purchased over-the-counter as Restaurant manager, fast food.  I have also recommended eye lubricant drops (i.e., Natural Tears) as needed.  Moderate persistent asthma Currently well controlled.  For now, continue Advair 45-21 g, 2 inhalations via spacer device twice daily.  Continue montelukast 10 mg daily at bedtime.  Continue albuterol HFA, 1 to 2 inhalations every 4-6 hours if needed.   Meds ordered this encounter  Medications  . levocetirizine (XYZAL) 5 MG tablet    Sig: Take 1 tablet (5 mg total) by mouth daily as needed for allergies.    Dispense:  30 tablet    Refill:  5  . azelastine (ASTELIN) 0.1 % nasal spray    Sig: 1-2 sprays  each nostril twice daily as needed    Dispense:  30 mL    Refill:  5  . Olopatadine HCl (PATADAY) 0.2 % SOLN    Sig: Place 1 drop into both eyes daily as needed.    Dispense:  2.5 mL    Refill:  5    Diagnostics: Spirometry:  FVC of 1.74 L and an FEV1 of 1.21 L (63% predicted) with an FEV1 ratio of 90%.  There was not significant postbronchodilator improvement.  Restrictive pattern without significant bronchodilator response.  The restrictive pattern  may be due to body habitus.  This study was performed while the patient was asymptomatic.  Please see scanned spirometry results for details. Allergy skin testing: Positive to grass pollen, weed pollen, ragweed pollen, tree pollen, molds, cat hair, dog epithelia,  cockroach antigen, and dust mite antigen.    Physical examination: Blood pressure 104/74, pulse 64, temperature (!) 97.4 F (36.3 C), temperature source Oral, resp. rate 16, height 4' 11.5" (1.511 m), weight 226 lb (102.5 kg), SpO2 99 %.  General: Alert, interactive, in no acute distress. HEENT: TMs pearly gray, turbinates edematous with clear discharge, post-pharynx moderately erythematous. Neck: Supple without lymphadenopathy. Lungs: Clear to auscultation without wheezing, rhonchi or rales. CV: Normal S1, S2 without murmurs. Abdomen: Nondistended, nontender. Skin: Warm and dry, without lesions or rashes. Extremities:  No clubbing, cyanosis or edema. Neuro:   Grossly intact.  Review of systems:  Review of systems negative except as noted in HPI / PMHx or noted below: Review of Systems  Constitutional: Negative.   HENT: Negative.   Eyes: Negative.   Respiratory: Negative.   Cardiovascular: Negative.   Gastrointestinal: Negative.   Genitourinary: Negative.   Musculoskeletal: Negative.   Skin: Negative.   Neurological: Negative.   Endo/Heme/Allergies: Negative.   Psychiatric/Behavioral: Negative.     Past medical history:  Past Medical History:  Diagnosis Date  . Anemia   . Asthma   . Diabetes mellitus without complication ([place])   . Foot pain, right   . Hypertension   . IBS (irritable bowel syndrome)   . Sciatica of left side   . Seasonal allergies     Past surgical history:  Past Surgical History:  Procedure Laterality Date  . ABDOMINAL HYSTERECTOMY    . APPENDECTOMY    . BLADDER SUSPENSION  2012   TVT  . BREAST SURGERY  2001   /biopsy-benign  . CHOLECYSTECTOMY    . EXCISIONAL HEMORRHOIDECTOMY    . KNEE SURGERY     right  . SHOULDER SURGERY Left 02/13/2018   Lipoma removed   . Small Bowel Surgery    . TONSILLECTOMY    . TOTAL ABDOMINAL HYSTERECTOMY W/ BILATERAL SALPINGOOPHORECTOMY  1989   LSO-1987; [id number]  . WRIST SURGERY     carpal tunnel repair     Family history: Family History  Problem Relation Age of Onset  . Diabetes Father   . Hypertension Father   . Heart disease Father   . Diabetes Mother   . Hypertension Mother   . Asthma Mother   . Heart disease Mother   . Allergic rhinitis Mother   . Diabetes Brother   . Hypertension Sister   . Diabetes Sister   . Cancer Sister 80       breast  . Hypertension Paternal Uncle     Social history: Social History   Socioeconomic History  . Marital status: Married    Spouse name: Not on file  . Number of children: Not on file  . Years of education: Not on file  . Highest education level: Not on file  Occupational History  . Occupation: retired in 2015 from [employer].  Tobacco Use  . Smoking status: Never Smoker  . Smokeless tobacco: Never Used  Vaping Use  . Vaping Use: Never used  Substance and Sexual Activity  . Alcohol use: No  . Drug use: No  . Sexual activity: Yes    Partners: Male    Birth control/protection: Surgical  Other Topics Concern  .  Not on file  Social History Narrative  . Not on file   Social Determinants of Health   Financial Resource Strain:   . Difficulty of Paying Living Expenses:   Food Insecurity:   . Worried About Charity fundraiser in the Last Year:   . Arboriculturist in the Last Year:   Transportation Needs:   . Film/video editor (Medical):   Marland Kitchen Lack of Transportation (Non-Medical):   Physical Activity:   . Days of Exercise per Week:   . Minutes of Exercise per Session:   Stress:   . Feeling of Stress :   Social Connections:   . Frequency of Communication with Friends and Family:   . Frequency of Social Gatherings with Friends and Family:   . Attends Religious Services:   . Active Member of Clubs or Organizations:   . Attends Archivist Meetings:   Marland Kitchen Marital Status:   Intimate Partner Violence:   . Fear of Current or Ex-Partner:   . Emotionally Abused:   Marland Kitchen Physically Abused:   . Sexually Abused:      Environmental History: The patient lives in a 58 year old house with carpeting in the bedroom, gas heat, and central air.  There is no known mold/water damage in the home.  There are no pets in the home.  She is a non-smoker.  Current Outpatient Medications  Medication Sig Dispense Refill  . blood glucose meter kit and supplies KIT Check blood sugar once daily. One Touch Verio Reflect Meter 1 each 0  . EPINEPHrine (EPIPEN JR) 0.15 MG/0.3ML injection Inject 0.15 mg into the muscle daily as needed for anaphylaxis.     Marland Kitchen esomeprazole (NEXIUM) 20 MG capsule Take 1 capsule (20 mg total) by mouth daily before breakfast. 90 capsule 1  . estradiol (ESTRACE) 0.1 MG/GM vaginal cream Place vaginally.    . fluticasone-salmeterol (ADVAIR HFA) 45-21 MCG/ACT inhaler Inhale 2 puffs into the lungs 2 (two) times daily. 1 Inhaler 0  . Lancets (ONETOUCH DELICA PLUS [id number]) MISC USE TO TEST BLOOD SUGAR ONCE DAILY    . losartan (COZAAR) 100 MG tablet Take 1 tablet (100 mg total) by mouth daily. 90 tablet 3  . montelukast (SINGULAIR) 10 MG tablet Take 1 tablet (10 mg total) by mouth at bedtime. FOR SEASONAL ALLERGY 90 tablet 3  . Multiple Vitamin (MULTIVITAMIN) capsule Take 1 capsule by mouth daily.    Glory Rosebush VERIO test strip USE TO TEST BLOOD SUGAR ONCE DAILY    . PROAIR HFA 108 (90 Base) MCG/ACT inhaler Inhale 2 puffs into the lungs every 6 (six) hours as needed for wheezing or shortness of breath. Inhale 2 puff into the lungs every 6 hours as needed for wheezing or shortness of breath 18 g 5  . triamterene-hydrochlorothiazide (MAXZIDE) 75-50 MG tablet Take 0.5 tablets by mouth daily. 45 tablet 1  . azelastine (ASTELIN) 0.1 % nasal spray 1-2 sprays  each nostril twice daily as needed 30 mL 5  . levocetirizine (XYZAL) 5 MG tablet Take 1 tablet (5 mg total) by mouth daily as needed for allergies. 30 tablet 5  . Olopatadine HCl (PATADAY) 0.2 % SOLN Place 1 drop into both eyes daily as needed. 2.5 mL 5   No  current facility-administered medications for this visit.    Known medication allergies: Allergies  Allergen Reactions  . Cortisone     Red area around injection site x 50mh  . Depo-Medrol [Methylprednisolone Sodium Succ] Nausea Only and Other (  See Comments)    Dizziness  . Aspirin Anxiety and Other (See Comments)    Rapid Heart beat    I appreciate the opportunity to take part in Charron's care. Please do not hesitate to contact me with questions.  Sincerely,   R. Edgar Frisk, MD

## 2020-05-30 NOTE — Patient Instructions (Addendum)
Perennial and seasonal allergic rhinitis  Aeroallergen avoidance measures have been discussed and provided in written form.  A prescription has been provided for levocetirizine(Xyzal), 5 mg daily as needed.  To avoid diminishing benefit with daily use (tachyphylaxis) of second generation antihistamine, consider alternating every few months between fexofenadine (Allegra) and levocetirizine (Xyzal).  A prescription has been provided for azelastine nasal spray, 1-2 sprays per nostril 2 times daily as needed. Proper nasal spray technique has been discussed and demonstrated.   Nasal saline spray (i.e., Simply Saline) or nasal saline lavage (i.e., NeilMed) is recommended as needed and prior to medicated nasal sprays.  The risks and benefits of aeroallergen immunotherapy have been discussed. The patient is motivated to initiate immunotherapy to reduce symptoms and decrease medication requirement. Informed consent has been signed and allergen vaccine orders have been submitted. Medications will be decreased or discontinued as symptom relief from immunotherapy becomes evident.  Allergic conjunctivitis  Treatment plan as outlined above for allergic rhinitis.  A prescription has been provided for generic Pataday, one drop per eye daily as needed.  If insurance does not cover this medication, medicated allergy eyedrops may be purchased over-the-counter as Restaurant manager, fast food.  I have also recommended eye lubricant drops (i.e., Natural Tears) as needed.  Moderate persistent asthma Currently well controlled.  For now, continue Advair 45-21 g, 2 inhalations via spacer device twice daily.  Continue montelukast 10 mg daily at bedtime.  Continue albuterol HFA, 1 to 2 inhalations every 4-6 hours if needed.   Return in about 3 months (around 08/30/2020), or if symptoms worsen or fail to improve.  Control of Dust Mite Allergen  House dust mites play a major role in allergic asthma and  rhinitis.  They occur in environments with high humidity wherever human skin, the food for dust mites is found. High levels have been detected in dust obtained from mattresses, pillows, carpets, upholstered furniture, bed covers, clothes and soft toys.  The principal allergen of the house dust mite is found in its feces.  A gram of dust may contain 1,000 mites and 250,000 fecal particles.  Mite antigen is easily measured in the air during house cleaning activities.    1. Encase mattresses, including the box spring, and pillow, in an air tight cover.  Seal the zipper end of the encased mattresses with wide adhesive tape. 2. Wash the bedding in water of 130 degrees Farenheit weekly.  Avoid cotton comforters/quilts and flannel bedding: the most ideal bed covering is the dacron comforter. 3. Remove all upholstered furniture from the bedroom. 4. Remove carpets, carpet padding, rugs, and non-washable window drapes from the bedroom.  Wash drapes weekly or use plastic window coverings. 5. Remove all non-washable stuffed toys from the bedroom.  Wash stuffed toys weekly. 6. Have the room cleaned frequently with a vacuum cleaner and a damp dust-mop.  The patient should not be in a room which is being cleaned and should wait 1 hour after cleaning before going into the room. 7. Close and seal all heating outlets in the bedroom.  Otherwise, the room will become filled with dust-laden air.  An electric heater can be used to heat the room. Reduce indoor humidity to less than 50%.  Do not use a humidifier.   Reducing Pollen Exposure  The American Academy of Allergy, Asthma and Immunology suggests the following steps to reduce your exposure to pollen during allergy seasons.    1. Do not hang sheets or clothing out to dry; pollen  may collect on these items. 2. Do not mow lawns or spend time around freshly cut grass; mowing stirs up pollen. 3. Keep windows closed at night.  Keep car windows closed while  driving. 4. Minimize morning activities outdoors, a time when pollen counts are usually at their highest. 5. Stay indoors as much as possible when pollen counts or humidity is high and on windy days when pollen tends to remain in the air longer. 6. Use air conditioning when possible.  Many air conditioners have filters that trap the pollen spores. 7. Use a HEPA room air filter to remove pollen form the indoor air you breathe.   Control of Dog or Cat Allergen  Avoidance is the best way to manage a dog or cat allergy. If you have a dog or cat and are allergic to dog or cats, consider removing the dog or cat from the home. If you have a dog or cat but don't want to find it a new home, or if your family wants a pet even though someone in the household is allergic, here are some strategies that may help keep symptoms at bay:  1. Keep the pet out of your bedroom and restrict it to only a few rooms. Be advised that keeping the dog or cat in only one room will not limit the allergens to that room. 2. Don't pet, hug or kiss the dog or cat; if you do, wash your hands with soap and water. 3. High-efficiency particulate air (HEPA) cleaners run continuously in a bedroom or living room can reduce allergen levels over time. 4. Place electrostatic material sheet in the air inlet vent in the bedroom. 5. Regular use of a high-efficiency vacuum cleaner or a central vacuum can reduce allergen levels. 6. Giving your dog or cat a bath at least once a week can reduce airborne allergen.   Control of Mold Allergen  Mold and fungi can grow on a variety of surfaces provided certain temperature and moisture conditions exist.  Outdoor molds grow on plants, decaying vegetation and soil.  The major outdoor mold, Alternaria and Cladosporium, are found in very high numbers during hot and dry conditions.  Generally, a late Summer - Fall peak is seen for common outdoor fungal spores.  Rain will temporarily lower outdoor mold  spore count, but counts rise rapidly when the rainy period ends.  The most important indoor molds are Aspergillus and Penicillium.  Dark, humid and poorly ventilated basements are ideal sites for mold growth.  The next most common sites of mold growth are the bathroom and the kitchen.  Outdoor [employer] 1. Use air conditioning and keep windows closed 2. Avoid exposure to decaying vegetation. 3. Avoid leaf raking. 4. Avoid grain handling. 5. Consider wearing a face mask if working in moldy areas.  Indoor Mold Control 1. Maintain humidity below 50%. 2. Clean washable surfaces with 5% bleach solution. 3. Remove sources e.g. Contaminated carpets.   Control of Cockroach Allergen  Cockroach allergen has been identified as an important cause of acute attacks of asthma, especially in urban settings.  There are fifty-five species of cockroach that exist in the [place], however only three, the [place], [place] species produce allergen that can affect patients with Asthma.  Allergens can be obtained from fecal particles, egg casings and secretions from cockroaches.    1. Remove food sources. 2. Reduce access to water. 3. Seal access and entry points. 4. Spray runways with 0.5-1% Diazinon or Chlorpyrifos 5.  Blow boric acid power under stoves and refrigerator. 6. Place bait stations (hydramethylnon) at feeding sites.

## 2020-05-31 ENCOUNTER — Ambulatory Visit (INDEPENDENT_AMBULATORY_CARE_PROVIDER_SITE_OTHER): Payer: BC Managed Care – PPO | Admitting: Cardiovascular Disease

## 2020-05-31 ENCOUNTER — Encounter: Payer: Self-pay | Admitting: Cardiovascular Disease

## 2020-05-31 DIAGNOSIS — R600 Localized edema: Secondary | ICD-10-CM | POA: Diagnosis not present

## 2020-05-31 DIAGNOSIS — I10 Essential (primary) hypertension: Secondary | ICD-10-CM

## 2020-05-31 NOTE — Progress Notes (Signed)
VIALS EXP 06-01-21

## 2020-05-31 NOTE — Assessment & Plan Note (Signed)
History of essential hypertension a blood pressure measured today 120/74.  She is on losartan and Maxide.

## 2020-05-31 NOTE — Patient Instructions (Signed)
Medication Instructions:  The current medical regimen is effective;  continue present plan and medications.  *If you need a refill on your cardiac medications before your next appointment, please call your pharmacy*   Follow-Up: At [facility], you and your health needs are our priority.  As part of our continuing mission to provide you with exceptional heart care, we have created designated Provider Care Teams.  These Care Teams include your primary Cardiologist (physician) and Advanced Practice Providers (APPs -  Physician Assistants and Nurse Practitioners) who all work together to provide you with the care you need, when you need it.  We recommend signing up for the patient portal called "MyChart".  Sign up information is provided on this After Visit Summary.  MyChart is used to connect with patients for Virtual Visits (Telemedicine).  Patients are able to view lab/test results, encounter notes, upcoming appointments, etc.  Non-urgent messages can be sent to your provider as well.   To learn more about what you can do with MyChart, go to [email or web address].    Your next appointment:   6 months with APP (PA, NP) 12 months with Dr.Berry  The format for your next appointment:   In Person  Provider:   Quay Burow, MD

## 2020-05-31 NOTE — Assessment & Plan Note (Signed)
History bilateral lower extremity edema improved on Maxide.  She did have a 2D echo that showed normal systolic function with grade 1 diastolic dysfunction and venous Dopplers which were normal as well.

## 2020-05-31 NOTE — Progress Notes (Signed)
05/31/2020 Mackenzie Weber   1961-05-14  [id number]  Primary Physician Nche, Charlene Brooke, NP Primary Cardiologist: Lorretta Harp MD Mackenzie Weber  HPI:  Mackenzie Weber is a 59 y.o.  morbidly overweight married African-American female mother of 2, grandmother of 3 grandchildren is retired from being in the real estate business. She is referred by Dr.Nche for evaluation of bilateral lower extreme edema.  I last saw her in the office 02/24/2020.  She does have a history of treated hypertension and diabetes. She is not hyperlipidemic. She is never smoked. She does have a family history of heart disease with mother had bypass surgery and father had a myocardial infarction. She never had a heart attack or stroke. She denies chest pain or shortness of breath. She did have a negative GXT 11/06/2022 exercise tolerance. She is noticed lower extremity edema over the last several months she says which began after she had a hairline fracture of her left foot. She denies excessive salt intake. She was placed briefly on furosemide which she apparently did not tolerate.  Since I saw her she did have a 2D echocardiogram that showed normal LV systolic function with grade 1 diastolic dysfunction as well as venous Doppler studies that were negative for DVT.  She was started on losartan and Maxide.  Her edema has significantly improved and she feels clinically improved as well.  She is very active and exercises frequently.    Current Meds  Medication Sig  . azelastine (ASTELIN) 0.1 % nasal spray 1-2 sprays  each nostril twice daily as needed  . blood glucose meter kit and supplies KIT Check blood sugar once daily. One Touch Verio Reflect Meter  . EPINEPHrine (EPIPEN JR) 0.15 MG/0.3ML injection Inject 0.15 mg into the muscle daily as needed for anaphylaxis.   Marland Kitchen esomeprazole (NEXIUM) 20 MG capsule Take 1 capsule (20 mg total) by mouth daily before breakfast.  . estradiol (ESTRACE) 0.1 MG/GM vaginal  cream Place vaginally.  . fluticasone-salmeterol (ADVAIR HFA) 45-21 MCG/ACT inhaler Inhale 2 puffs into the lungs 2 (two) times daily.  . Lancets (ONETOUCH DELICA PLUS [id number]) MISC USE TO TEST BLOOD SUGAR ONCE DAILY  . levocetirizine (XYZAL) 5 MG tablet Take 1 tablet (5 mg total) by mouth daily as needed for allergies.  Marland Kitchen losartan (COZAAR) 100 MG tablet Take 1 tablet (100 mg total) by mouth daily.  . montelukast (SINGULAIR) 10 MG tablet Take 1 tablet (10 mg total) by mouth at bedtime. FOR SEASONAL ALLERGY  . Multiple Vitamin (MULTIVITAMIN) capsule Take 1 capsule by mouth daily.  . Olopatadine HCl (PATADAY) 0.2 % SOLN Place 1 drop into both eyes daily as needed.  Glory Rosebush VERIO test strip USE TO TEST BLOOD SUGAR ONCE DAILY  . PROAIR HFA 108 (90 Base) MCG/ACT inhaler Inhale 2 puffs into the lungs every 6 (six) hours as needed for wheezing or shortness of breath. Inhale 2 puff into the lungs every 6 hours as needed for wheezing or shortness of breath  . triamterene-hydrochlorothiazide (MAXZIDE) 75-50 MG tablet Take 0.5 tablets by mouth daily.     Allergies  Allergen Reactions  . Cortisone     Red area around injection site x 81mh  . Depo-Medrol [Methylprednisolone Sodium Succ] Nausea Only and Other (See Comments)    Dizziness  . Aspirin Anxiety and Other (See Comments)    Rapid Heart beat    Social History   Socioeconomic History  . Marital status: Married  Spouse name: Not on file  . Number of children: Not on file  . Years of education: Not on file  . Highest education level: Not on file  Occupational History  . Occupation: retired in 2015 from [employer].  Tobacco Use  . Smoking status: Never Smoker  . Smokeless tobacco: Never Used  Vaping Use  . Vaping Use: Never used  Substance and Sexual Activity  . Alcohol use: No  . Drug use: No  . Sexual activity: Yes    Partners: Male    Birth control/protection: Surgical  Other Topics Concern  . Not on file  Social  History Narrative  . Not on file   Social Determinants of Health   Financial Resource Strain:   . Difficulty of Paying Living Expenses:   Food Insecurity:   . Worried About Charity fundraiser in the Last Year:   . Arboriculturist in the Last Year:   Transportation Needs:   . Film/video editor (Medical):   Marland Kitchen Lack of Transportation (Non-Medical):   Physical Activity:   . Days of Exercise per Week:   . Minutes of Exercise per Session:   Stress:   . Feeling of Stress :   Social Connections:   . Frequency of Communication with Friends and Family:   . Frequency of Social Gatherings with Friends and Family:   . Attends Religious Services:   . Active Member of Clubs or Organizations:   . Attends Archivist Meetings:   Marland Kitchen Marital Status:   Intimate Partner Violence:   . Fear of Current or Ex-Partner:   . Emotionally Abused:   Marland Kitchen Physically Abused:   . Sexually Abused:      Review of Systems: General: negative for chills, fever, night sweats or weight changes.  Cardiovascular: negative for chest pain, dyspnea on exertion, edema, orthopnea, palpitations, paroxysmal nocturnal dyspnea or shortness of breath Dermatological: negative for rash Respiratory: negative for cough or wheezing Urologic: negative for hematuria Abdominal: negative for nausea, vomiting, diarrhea, bright red blood per rectum, melena, or hematemesis Neurologic: negative for visual changes, syncope, or dizziness All other systems reviewed and are otherwise negative except as noted above.    Blood pressure 120/74, pulse 71, height 5' (1.524 m), weight 227 lb 9.6 oz (103.2 kg), SpO2 98 %.  General appearance: alert and no distress Neck: no adenopathy, no carotid bruit, no JVD, supple, symmetrical, trachea midline and thyroid not enlarged, symmetric, no tenderness/mass/nodules Lungs: clear to auscultation bilaterally Heart: regular rate and rhythm, S1, S2 normal, no murmur, click, rub or  gallop Extremities: Trace lower extremity edema Pulses: 2+ and symmetric Skin: Skin color, texture, turgor normal. No rashes or lesions Neurologic: Alert and oriented X 3, normal strength and tone. Normal symmetric reflexes. Normal coordination and gait  EKG not performed today  ASSESSMENT AND PLAN:   Bilateral lower extremity edema History bilateral lower extremity edema improved on Maxide.  She did have a 2D echo that showed normal systolic function with grade 1 diastolic dysfunction and venous Dopplers which were normal as well.  Hypertensive disorder History of essential hypertension a blood pressure measured today 120/74.  She is on losartan and Maxide.      Lorretta Harp MD FACP,FACC,FAHA, [facility] 05/31/2020 11:51 AM

## 2020-06-01 DIAGNOSIS — J3089 Other allergic rhinitis: Secondary | ICD-10-CM

## 2020-06-02 DIAGNOSIS — J3081 Allergic rhinitis due to animal (cat) (dog) hair and dander: Secondary | ICD-10-CM

## 2020-06-10 ENCOUNTER — Other Ambulatory Visit: Payer: Self-pay | Admitting: Nurse Practitioner

## 2020-06-10 NOTE — Telephone Encounter (Signed)
Medication refill request for Symbicort. Refill request declined. Medication has never been prescribed by NP [facility]. Prescription for medication last ordered in 07/2017 by another provider per medication list.

## 2020-06-13 ENCOUNTER — Telehealth: Payer: Self-pay | Admitting: Nurse Practitioner

## 2020-06-13 NOTE — Telephone Encounter (Signed)
Spoke with patient, refill is not needed she just needed clarification as to why she was switched to Advair and once she got home she remembered and states she no longer needs this request.

## 2020-06-13 NOTE — Telephone Encounter (Signed)
Patient is calling and requesting a refill for Symbicort sent to [facility] on [address], please advise. CB is [phone]

## 2020-06-22 ENCOUNTER — Ambulatory Visit: Payer: BC Managed Care – PPO | Admitting: Nurse Practitioner

## 2020-06-27 ENCOUNTER — Ambulatory Visit (INDEPENDENT_AMBULATORY_CARE_PROVIDER_SITE_OTHER): Payer: BC Managed Care – PPO

## 2020-06-27 ENCOUNTER — Other Ambulatory Visit: Payer: Self-pay

## 2020-06-27 DIAGNOSIS — J309 Allergic rhinitis, unspecified: Secondary | ICD-10-CM | POA: Diagnosis not present

## 2020-06-27 DIAGNOSIS — J301 Allergic rhinitis due to pollen: Secondary | ICD-10-CM

## 2020-06-27 NOTE — Progress Notes (Signed)
Immunotherapy   Patient Details  Name: MAIZE BRITTINGHAM MRN: [id number] Date of Birth: [birth year]  06/27/2020  [name] pt here to start on blue vial Following schedule: a  Frequency:1-2 x weekly Epi-Pen:yes Consent signed and patient instructions given.   Chinita Pester Makailyn Mccormick 06/27/2020, 10:05 AM

## 2020-07-06 ENCOUNTER — Ambulatory Visit (INDEPENDENT_AMBULATORY_CARE_PROVIDER_SITE_OTHER): Payer: BC Managed Care – PPO

## 2020-07-06 DIAGNOSIS — J301 Allergic rhinitis due to pollen: Secondary | ICD-10-CM

## 2020-07-06 DIAGNOSIS — J309 Allergic rhinitis, unspecified: Secondary | ICD-10-CM | POA: Diagnosis not present

## 2020-07-15 ENCOUNTER — Ambulatory Visit (INDEPENDENT_AMBULATORY_CARE_PROVIDER_SITE_OTHER): Payer: BC Managed Care – PPO

## 2020-07-15 DIAGNOSIS — J301 Allergic rhinitis due to pollen: Secondary | ICD-10-CM | POA: Diagnosis not present

## 2020-07-21 ENCOUNTER — Ambulatory Visit (INDEPENDENT_AMBULATORY_CARE_PROVIDER_SITE_OTHER): Payer: BC Managed Care – PPO

## 2020-07-21 DIAGNOSIS — J309 Allergic rhinitis, unspecified: Secondary | ICD-10-CM

## 2020-07-21 DIAGNOSIS — J301 Allergic rhinitis due to pollen: Secondary | ICD-10-CM

## 2020-07-27 ENCOUNTER — Ambulatory Visit (INDEPENDENT_AMBULATORY_CARE_PROVIDER_SITE_OTHER): Payer: BC Managed Care – PPO

## 2020-07-27 DIAGNOSIS — J309 Allergic rhinitis, unspecified: Secondary | ICD-10-CM

## 2020-08-02 ENCOUNTER — Ambulatory Visit (INDEPENDENT_AMBULATORY_CARE_PROVIDER_SITE_OTHER): Payer: BC Managed Care – PPO

## 2020-08-02 DIAGNOSIS — J309 Allergic rhinitis, unspecified: Secondary | ICD-10-CM | POA: Diagnosis not present

## 2020-08-10 ENCOUNTER — Other Ambulatory Visit: Payer: Self-pay

## 2020-08-10 ENCOUNTER — Ambulatory Visit (INDEPENDENT_AMBULATORY_CARE_PROVIDER_SITE_OTHER): Payer: BC Managed Care – PPO

## 2020-08-10 DIAGNOSIS — J309 Allergic rhinitis, unspecified: Secondary | ICD-10-CM | POA: Diagnosis not present

## 2020-08-11 ENCOUNTER — Ambulatory Visit (INDEPENDENT_AMBULATORY_CARE_PROVIDER_SITE_OTHER): Payer: BC Managed Care – PPO | Admitting: Nurse Practitioner

## 2020-08-11 ENCOUNTER — Encounter: Payer: Self-pay | Admitting: Nurse Practitioner

## 2020-08-11 VITALS — BP 126/80 | HR 76 | Temp 96.9°F | Ht 58.75 in | Wt 223.4 lb

## 2020-08-11 DIAGNOSIS — Z23 Encounter for immunization: Secondary | ICD-10-CM

## 2020-08-11 DIAGNOSIS — R600 Localized edema: Secondary | ICD-10-CM

## 2020-08-11 DIAGNOSIS — Z0000 Encounter for general adult medical examination without abnormal findings: Secondary | ICD-10-CM | POA: Diagnosis not present

## 2020-08-11 DIAGNOSIS — E876 Hypokalemia: Secondary | ICD-10-CM

## 2020-08-11 DIAGNOSIS — Z0001 Encounter for general adult medical examination with abnormal findings: Secondary | ICD-10-CM

## 2020-08-11 DIAGNOSIS — E1165 Type 2 diabetes mellitus with hyperglycemia: Secondary | ICD-10-CM | POA: Diagnosis not present

## 2020-08-11 DIAGNOSIS — D649 Anemia, unspecified: Secondary | ICD-10-CM

## 2020-08-11 DIAGNOSIS — Z6841 Body Mass Index (BMI) 40.0 and over, adult: Secondary | ICD-10-CM

## 2020-08-11 DIAGNOSIS — I10 Essential (primary) hypertension: Secondary | ICD-10-CM

## 2020-08-11 DIAGNOSIS — Z136 Encounter for screening for cardiovascular disorders: Secondary | ICD-10-CM

## 2020-08-11 DIAGNOSIS — Z13220 Encounter for screening for lipoid disorders: Secondary | ICD-10-CM | POA: Diagnosis not present

## 2020-08-11 LAB — CBC WITH DIFFERENTIAL/PLATELET
Basophils Absolute: 0.1 10*3/uL (ref 0.0–0.1)
Basophils Relative: 1.9 % (ref 0.0–3.0)
Eosinophils Absolute: 0.1 10*3/uL (ref 0.0–0.7)
Eosinophils Relative: 2.4 % (ref 0.0–5.0)
HCT: 35 % — ABNORMAL LOW (ref 36.0–46.0)
Hemoglobin: 11 g/dL — ABNORMAL LOW (ref 12.0–15.0)
Lymphocytes Relative: 37.8 % (ref 12.0–46.0)
Lymphs Abs: 2.1 10*3/uL (ref 0.7–4.0)
MCHC: 31.4 g/dL (ref 30.0–36.0)
MCV: 67 fl — ABNORMAL LOW (ref 78.0–100.0)
Monocytes Absolute: 0.4 10*3/uL (ref 0.1–1.0)
Monocytes Relative: 6.7 % (ref 3.0–12.0)
Neutro Abs: 2.9 10*3/uL (ref 1.4–7.7)
Neutrophils Relative %: 51.2 % (ref 43.0–77.0)
Platelets: 290 10*3/uL (ref 150.0–400.0)
RBC: 5.22 Mil/uL — ABNORMAL HIGH (ref 3.87–5.11)
RDW: 16.3 % — ABNORMAL HIGH (ref 11.5–15.5)
WBC: 5.6 10*3/uL (ref 4.0–10.5)

## 2020-08-11 LAB — COMPREHENSIVE METABOLIC PANEL
ALT: 10 U/L (ref 0–35)
AST: 16 U/L (ref 0–37)
Albumin: 4.2 g/dL (ref 3.5–5.2)
Alkaline Phosphatase: 55 U/L (ref 39–117)
BUN: 13 mg/dL (ref 6–23)
CO2: 29 mEq/L (ref 19–32)
Calcium: 9.8 mg/dL (ref 8.4–10.5)
Chloride: 102 mEq/L (ref 96–112)
Creatinine, Ser: 0.97 mg/dL (ref 0.40–1.20)
GFR: 64.43 mL/min (ref >60.00)
Glucose, Bld: 97 mg/dL (ref 70–99)
Potassium: 3.3 mEq/L — ABNORMAL LOW (ref 3.5–5.1)
Sodium: 138 mEq/L (ref 135–145)
Total Bilirubin: 0.3 mg/dL (ref 0.2–1.2)
Total Protein: 7.3 g/dL (ref 6.0–8.3)

## 2020-08-11 LAB — LIPID PANEL
Cholesterol: 168 mg/dL (ref 0–200)
HDL: 56.5 mg/dL (ref >39.00)
LDL Cholesterol: 98 mg/dL (ref 0–99)
NonHDL: 111.29
Total CHOL/HDL Ratio: 3
Triglycerides: 64 mg/dL (ref 0.0–149.0)
VLDL: 12.8 mg/dL (ref 0.0–40.0)

## 2020-08-11 LAB — HEMOGLOBIN A1C: Hgb A1c MFr Bld: 7.2 % — ABNORMAL HIGH (ref 4.6–6.5)

## 2020-08-11 LAB — MICROALBUMIN / CREATININE URINE RATIO
Creatinine,U: 84.3 mg/dL
Microalb Creat Ratio: 0.8 mg/g (ref 0.0–30.0)
Microalb, Ur: <0.7 mg/dL (ref 0.0–1.9)

## 2020-08-11 NOTE — Patient Instructions (Addendum)
Go to lab for blood draw and urine collection  We will obtain eye exam report from [facility] in [place] and records from [place].   Health Maintenance, Female Adopting a healthy lifestyle and getting preventive care are important in promoting health and wellness. Ask your health care provider about:  The right schedule for you to have regular tests and exams.  Things you can do on your own to prevent diseases and keep yourself healthy. What should I know about diet, weight, and exercise? Eat a healthy diet   Eat a diet that includes plenty of vegetables, fruits, low-fat dairy products, and lean protein.  Do not eat a lot of foods that are high in solid fats, added sugars, or sodium. Maintain a healthy weight Body mass index (BMI) is used to identify weight problems. It estimates body fat based on height and weight. Your health care provider can help determine your BMI and help you achieve or maintain a healthy weight. Get regular exercise Get regular exercise. This is one of the most important things you can do for your health. Most adults should:  Exercise for at least 150 minutes each week. The exercise should increase your heart rate and make you sweat (moderate-intensity exercise).  Do strengthening exercises at least twice a week. This is in addition to the moderate-intensity exercise.  Spend less time sitting. Even light physical activity can be beneficial. Watch cholesterol and blood lipids Have your blood tested for lipids and cholesterol at 58 years of age, then have this test every 5 years. Have your cholesterol levels checked more often if:  Your lipid or cholesterol levels are high.  You are older than 58 years of age.  You are at high risk for heart disease. What should I know about cancer screening? Depending on your health history and family history, you may need to have cancer screening at various ages. This may include screening for:  Breast  cancer.  Cervical cancer.  Colorectal cancer.  Skin cancer.  Lung cancer. What should I know about heart disease, diabetes, and high blood pressure? Blood pressure and heart disease  High blood pressure causes heart disease and increases the risk of stroke. This is more likely to develop in people who have high blood pressure readings, are of African descent, or are overweight.  Have your blood pressure checked: ? Every 3-5 years if you are 60-30 years of age. ? Every year if you are 36 years old or older. Diabetes Have regular diabetes screenings. This checks your fasting blood sugar level. Have the screening done:  Once every three years after age 33 if you are at a normal weight and have a low risk for diabetes.  More often and at a younger age if you are overweight or have a high risk for diabetes. What should I know about preventing infection? Hepatitis B If you have a higher risk for hepatitis B, you should be screened for this virus. Talk with your health care provider to find out if you are at risk for hepatitis B infection. Hepatitis C Testing is recommended for:  Everyone born from [birth year] through 1964.  Anyone with known risk factors for hepatitis C. Sexually transmitted infections (STIs)  Get screened for STIs, including gonorrhea and chlamydia, if: ? You are sexually active and are younger than 58 years of age. ? You are older than 58 years of age and your health care provider tells you that you are at risk for this type  of infection. ? Your sexual activity has changed since you were last screened, and you are at increased risk for chlamydia or gonorrhea. Ask your health care provider if you are at risk.  Ask your health care provider about whether you are at high risk for HIV. Your health care provider may recommend a prescription medicine to help prevent HIV infection. If you choose to take medicine to prevent HIV, you should first get tested for HIV. You should  then be tested every 3 months for as long as you are taking the medicine. Pregnancy  If you are about to stop having your period (premenopausal) and you may become pregnant, seek counseling before you get pregnant.  Take 400 to 800 micrograms (mcg) of folic acid every day if you become pregnant.  Ask for birth control (contraception) if you want to prevent pregnancy. Osteoporosis and menopause Osteoporosis is a disease in which the bones lose minerals and strength with aging. This can result in bone fractures. If you are 59 years old or older, or if you are at risk for osteoporosis and fractures, ask your health care provider if you should:  Be screened for bone loss.  Take a calcium or vitamin D supplement to lower your risk of fractures.  Be given hormone replacement therapy (HRT) to treat symptoms of menopause. Follow these instructions at home: Lifestyle  Do not use any products that contain nicotine or tobacco, such as cigarettes, e-cigarettes, and chewing tobacco. If you need help quitting, ask your health care provider.  Do not use street drugs.  Do not share needles.  Ask your health care provider for help if you need support or information about quitting drugs. Alcohol use  Do not drink alcohol if: ? Your health care provider tells you not to drink. ? You are pregnant, may be pregnant, or are planning to become pregnant.  If you drink alcohol: ? Limit how much you use to 0-1 drink a day. ? Limit intake if you are breastfeeding.  Be aware of how much alcohol is in your drink. In the [place] one drink equals one 12 oz bottle of beer (355 mL), one 5 oz glass of wine (148 mL), or one 1 oz glass of hard liquor (44 mL). General instructions  Schedule regular health, dental, and eye exams.  Stay current with your vaccines.  Tell your health care provider if: ? You often feel depressed. ? You have ever been abused or do not feel safe at home. Summary  Adopting a  healthy lifestyle and getting preventive care are important in promoting health and wellness.  Follow your health care provider's instructions about healthy diet, exercising, and getting tested or screened for diseases.  Follow your health care provider's instructions on monitoring your cholesterol and blood pressure. This information is not intended to replace advice given to you by your health care provider. Make sure you discuss any questions you have with your health care provider. Document Revised: 09/24/2018 Document Reviewed: 09/24/2018 Elsevier Patient Education  2020 [employer].

## 2020-08-11 NOTE — Progress Notes (Signed)
Subjective:    Patient ID: Mackenzie Weber, female    DOB: 1961-06-01, 59 y.o.   MRN: [id number]  Patient presents today for CPE and eval of chronic conditions  HPI Hypertensive disorder BP at goal with losartan and maxzide Persistent LE edema, worse with prolong sitting and standing No SOB/CP/dizziness BP Readings from Last 3 Encounters:  08/11/20 126/80  05/31/20 120/74  05/30/20 104/74   Repeat BMP Maintain current medications  Morbid obesity due to excess calories (HCC) Exercise: walking and stretching 62mns 3x/week Diet: not always compliant. Chronic back and knee pain Wt Readings from Last 3 Encounters:  08/11/20 223 lb 6.4 oz (101.3 kg)  05/31/20 227 lb 9.6 oz (103.2 kg)  05/30/20 226 lb (102.5 kg)   Advised about need for weight loss Advised to consider resume appts with nutritionist  Type 2 diabetes mellitus with hyperglycemia, without long-term current use of insulin (HCC) Last hgbA1c of 6.9 Repeat HgbA1c, urine microalbumin and lipid panel up to date with eye exam and dental cleaning Normal foot exam today BP at goal Declined influenza vaccine Administered pneumovax 23 today  Normal urine microalbumin, and lipid panel. HgbA1c at 7.2 from 6.9: need to start metformin 5072mdaily. New rx sent. Normal renal and liver function F/up in 10m61month  Sexual History (orientation,birth control, marital status, STD):up to date with mammogram and PAP screen per patient, completed by GYN in 11/2019 per aptient  Depression/Suicide: [facility] [place] 12/21/2019 10/29/2018 12/20/2017 10/22/2017 04/23/2017 04/20/2016 02/17/2016  Decreased Interest 0 0 0 0 0 0 0  Down, Depressed, Hopeless 0 0 0 0 0 0 0  PHQ - 2 Score 0 0 0 0 0 0 0   Vision:up to date  Dental:up to date.  Immunizations: (TDAP, Hep C screen, Pneumovax, Influenza, zoster)  Health Maintenance  Topic Date Due   Eye exam for diabetics  Never done   Pap Smear  11/16/2019   Flu Shot  01/12/2021*     Hepatitis C: One time screening is recommended by Center for Disease Control  (CDC) for  adults born from 1945 through 1965.   08/11/2021*   HIV Screening  08/11/2021*   Mammogram  11/13/2020   Hemoglobin A1C  02/09/2021   Complete foot exam   08/11/2021   Colon Cancer Screening  07/29/2024   Tetanus Vaccine  02/13/2027   Pneumococcal vaccine  Completed   COVID-19 Vaccine  Completed  *Topic was postponed. The date shown is not the original due date.   Diet:regular.  Weight:  Wt Readings from Last 3 Encounters:  08/11/20 223 lb 6.4 oz (101.3 kg)  05/31/20 227 lb 9.6 oz (103.2 kg)  05/30/20 226 lb (102.5 kg)    Medications and allergies reviewed with patient and updated if appropriate.  Patient Active Problem List   Diagnosis Date Noted   Allergic conjunctivitis 05/30/2020   Moderate persistent asthma 05/30/2020   Grade I diastolic dysfunction 06/56/25/6389Bilateral foot pain 01/13/2020   Extensor tendonitis of foot 01/13/2020   BMI 40.0-44.9, adult ([place]3/07/2020   Type 2 diabetes mellitus with hyperglycemia, without long-term current use of insulin ([place]/19/2020   Perennial and seasonal allergic rhinitis 05/20/2018   Chronic bilateral low back pain with left-sided sciatica 05/20/2018   Lipoma of left upper extremity 01/29/2018   Muscle spasm of left lower extremity 01/29/2018   Intestinal ulcer 04/23/2017   Bilateral lower extremity edema 04/23/2017   GERD (gastroesophageal reflux disease) 03/28/2016   History of  colon polyps 03/28/2016   Anemia 03/28/2016   Somatic dysfunction of cervical region 11/16/2015   Chronic sciatica 01/28/2015   Somatic dysfunction of pelvic region 01/28/2015   Chronic pelvic pain in female 01/28/2015   Somatic dysfunction of lower extremity 01/28/2015   Somatic dysfunction of sacral region 01/28/2015   Hypertensive disorder 10/22/2014   Morbid obesity due to excess calories ([place]) 10/22/2014   Abdominal  muscle strain 06/07/2014   Sciatica of left side 11/03/2012    Current Outpatient Medications on File Prior to Visit  Medication Sig Dispense Refill   azelastine (ASTELIN) 0.1 % nasal spray 1-2 sprays  each nostril twice daily as needed 30 mL 5   blood glucose meter kit and supplies KIT Check blood sugar once daily. One Touch Verio Reflect Meter 1 each 0   EPINEPHrine (EPIPEN JR) 0.15 MG/0.3ML injection Inject 0.15 mg into the muscle daily as needed for anaphylaxis.      esomeprazole (NEXIUM) 20 MG capsule Take 1 capsule (20 mg total) by mouth daily before breakfast. 90 capsule 1   estradiol (ESTRACE) 0.1 MG/GM vaginal cream Place vaginally.     fluticasone-salmeterol (ADVAIR HFA) 45-21 MCG/ACT inhaler Inhale 2 puffs into the lungs 2 (two) times daily. 1 Inhaler 0   Lancets (ONETOUCH DELICA PLUS [id number]) MISC USE TO TEST BLOOD SUGAR ONCE DAILY     levocetirizine (XYZAL) 5 MG tablet Take 1 tablet (5 mg total) by mouth daily as needed for allergies. 30 tablet 5   losartan (COZAAR) 100 MG tablet Take 1 tablet (100 mg total) by mouth daily. 90 tablet 3   montelukast (SINGULAIR) 10 MG tablet Take 1 tablet (10 mg total) by mouth at bedtime. FOR SEASONAL ALLERGY 90 tablet 3   Multiple Vitamin (MULTIVITAMIN) capsule Take 1 capsule by mouth daily.     Olopatadine HCl (PATADAY) 0.2 % SOLN Place 1 drop into both eyes daily as needed. 2.5 mL 5   ONETOUCH VERIO test strip USE TO TEST BLOOD SUGAR ONCE DAILY     PROAIR HFA 108 (90 Base) MCG/ACT inhaler Inhale 2 puffs into the lungs every 6 (six) hours as needed for wheezing or shortness of breath. Inhale 2 puff into the lungs every 6 hours as needed for wheezing or shortness of breath 18 g 5   No current facility-administered medications on file prior to visit.    Past Medical History:  Diagnosis Date   Anemia    Asthma    Diabetes mellitus without complication ([place])    Foot pain, right    Hypertension    IBS (irritable bowel  syndrome)    Sciatica of left side    Seasonal allergies     Past Surgical History:  Procedure Laterality Date   ABDOMINAL HYSTERECTOMY     APPENDECTOMY     BLADDER SUSPENSION  2012   TVT   BREAST SURGERY  2001   /biopsy-benign   CHOLECYSTECTOMY     EXCISIONAL HEMORRHOIDECTOMY     KNEE SURGERY     right   SHOULDER SURGERY Left 02/13/2018   Lipoma removed    Small Bowel Surgery     TONSILLECTOMY     TOTAL ABDOMINAL HYSTERECTOMY W/ BILATERAL SALPINGOOPHORECTOMY  1989   LSO-1987; RsO-1998   WRIST SURGERY     carpal tunnel repair    Social History   Socioeconomic History   Marital status: Married    Spouse name: Not on file   Number of children: Not on file   Years of  education: Not on file   Highest education level: Not on file  Occupational History   Occupation: retired in 2015 from [employer].  Tobacco Use   Smoking status: Never Smoker   Smokeless tobacco: Never Used  Vaping Use   Vaping Use: Never used  Substance and Sexual Activity   Alcohol use: No   Drug use: No   Sexual activity: Yes    Partners: Male    Birth control/protection: Surgical  Other Topics Concern   Not on file  Social History Narrative   Not on file   Social Determinants of Health   Financial Resource Strain:    Difficulty of Paying Living Expenses: Not on file  Food Insecurity:    Worried About Charity fundraiser in the Last Year: Not on file   [employer] of Food in the Last Year: Not on file  Transportation Needs:    Lack of Transportation (Medical): Not on file   Lack of Transportation (Non-Medical): Not on file  Physical Activity:    Days of Exercise per Week: Not on file   Minutes of Exercise per Session: Not on file  Stress:    Feeling of Stress : Not on file  Social Connections:    Frequency of Communication with Friends and Family: Not on file   Frequency of Social Gatherings with Friends and Family: Not on file   Attends  Religious Services: Not on file   Active Member of Clubs or Organizations: Not on file   Attends Archivist Meetings: Not on file   Marital Status: Not on file    Family History  Problem Relation Age of Onset   Diabetes Father    Hypertension Father    Heart disease Father    Diabetes Mother    Hypertension Mother    Asthma Mother    Heart disease Mother    Allergic rhinitis Mother    Diabetes Brother    Hypertension Sister    Diabetes Sister    Cancer Sister 39       breast   Hypertension Paternal Uncle         Review of Systems  Constitutional: Negative for fever, malaise/fatigue and weight loss.  HENT: Negative for congestion and sore throat.   Eyes:       Negative for visual changes  Respiratory: Negative for cough and shortness of breath.   Cardiovascular: Negative for chest pain, palpitations and leg swelling.  Gastrointestinal: Negative for blood in stool, constipation, diarrhea and heartburn.  Genitourinary: Negative for dysuria, frequency and urgency.  Musculoskeletal: Negative for falls, joint pain and myalgias.  Skin: Negative for rash.  Neurological: Negative for dizziness, sensory change and headaches.  Endo/Heme/Allergies: Does not bruise/bleed easily.  Psychiatric/Behavioral: Negative for depression, substance abuse and suicidal ideas. The patient is not nervous/anxious.    Objective:   Vitals:   08/11/20 0813  BP: 126/80  Pulse: 76  Temp: (!) 96.9 F (36.1 C)  SpO2: 99%   Body mass index is 45.51 kg/m.  Physical Examination:  Physical Exam Vitals and nursing note reviewed.  Constitutional:      Appearance: She is obese.  HENT:     Right Ear: Tympanic membrane, ear canal and external ear normal.     Left Ear: Tympanic membrane, ear canal and external ear normal.  Cardiovascular:     Rate and Rhythm: Normal rate and regular rhythm.     Pulses: Normal pulses.  Heart sounds: Normal heart sounds.  Pulmonary:      Effort: Pulmonary effort is normal.     Breath sounds: Normal breath sounds.  Abdominal:     General: Bowel sounds are normal.     Palpations: Abdomen is soft.  Genitourinary:    Comments: Deferred breast and pelvic exam to GYN per patient Musculoskeletal:        General: No tenderness.     Cervical back: Normal range of motion and neck supple.     Right lower leg: Edema present.     Left lower leg: Edema present.  Lymphadenopathy:     Cervical: No cervical adenopathy.  Skin:    Findings: No erythema or rash.  Neurological:     Mental Status: She is alert and oriented to person, place, and time.  Psychiatric:        Mood and Affect: Mood normal.        Behavior: Behavior normal.        Thought Content: Thought content normal.    ASSESSMENT and PLAN: This visit occurred during the SARS-CoV-2 public health emergency.  Safety protocols were in place, including screening questions prior to the visit, additional usage of staff PPE, and extensive cleaning of exam room while observing appropriate contact time as indicated for disinfecting solutions.   Mackenzie Weber was seen today for annual exam.  Diagnoses and all orders for this visit:  Encounter for preventative adult health care exam with abnormal findings -     CBC with Differential/Platelet -     Comprehensive metabolic panel -     Lipid panel  Primary hypertension  Type 2 diabetes mellitus with hyperglycemia, without long-term current use of insulin (HCC) -     Hemoglobin A1c -     Microalbumin / creatinine urine ratio -     metFORMIN (GLUCOPHAGE-XR) 500 MG 24 hr tablet; Take 1 tablet (500 mg total) by mouth daily with breakfast.  Encounter for lipid screening for cardiovascular disease -     Lipid panel  Class 3 severe obesity due to excess calories without serious comorbidity with body mass index (BMI) of 45.0 to 49.9 in adult [facility])  Need for Streptococcus pneumoniae vaccination -     Pneumococcal polysaccharide vaccine  23-valent greater than or equal to 2yo subcutaneous/IM  Anemia, unspecified type -     Fecal occult blood, imunochemical([facility]); Standing  Essential hypertension -     triamterene-hydrochlorothiazide (MAXZIDE) 75-50 MG tablet; Take 0.5 tablets by mouth daily.  Bilateral lower extremity edema -     triamterene-hydrochlorothiazide (MAXZIDE) 75-50 MG tablet; Take 0.5 tablets by mouth daily.  Other orders -     Discontinue: potassium chloride SA (KLOR-CON) 20 MEQ tablet; Take 2 tablets (40 mEq total) by mouth once for 1 dose.        Problem List Items Addressed This Visit      Cardiovascular and Mediastinum   Hypertensive disorder    BP at goal with losartan and maxzide Persistent LE edema, worse with prolong sitting and standing No SOB/CP/dizziness BP Readings from Last 3 Encounters:  08/11/20 126/80  05/31/20 120/74  05/30/20 104/74   Repeat BMP Maintain current medications      Relevant Medications   triamterene-hydrochlorothiazide (MAXZIDE) 75-50 MG tablet     Endocrine   Type 2 diabetes mellitus with hyperglycemia, without long-term current use of insulin (HCC)    Last hgbA1c of 6.9 Repeat HgbA1c, urine microalbumin and lipid panel up to date with eye exam and  dental cleaning Normal foot exam today BP at goal Declined influenza vaccine Administered pneumovax 23 today  Normal urine microalbumin, and lipid panel. HgbA1c at 7.2 from 6.9: need to start metformin 558m daily. New rx sent. Normal renal and liver function F/up in 615month      Relevant Medications   metFORMIN (GLUCOPHAGE-XR) 500 MG 24 hr tablet   Other Relevant Orders   Hemoglobin A1c (Completed)   Microalbumin / creatinine urine ratio (Completed)     Other   Anemia   Relevant Orders   Fecal occult blood, imunochemical([facility])   Bilateral lower extremity edema   Relevant Medications   triamterene-hydrochlorothiazide (MAXZIDE) 75-50 MG tablet   Morbid obesity due to  excess calories (HCC)    Exercise: walking and stretching 3066m 3x/week Diet: not always compliant. Chronic back and knee pain Wt Readings from Last 3 Encounters:  08/11/20 223 lb 6.4 oz (101.3 kg)  05/31/20 227 lb 9.6 oz (103.2 kg)  05/30/20 226 lb (102.5 kg)   Advised about need for weight loss Advised to consider resume appts with nutritionist      Relevant Medications   metFORMIN (GLUCOPHAGE-XR) 500 MG 24 hr tablet    Other Visit Diagnoses    Encounter for preventative adult health care exam with abnormal findings    -  Primary   Relevant Orders   CBC with Differential/Platelet (Completed)   Comprehensive metabolic panel (Completed)   Lipid panel (Completed)   Encounter for lipid screening for cardiovascular disease       Relevant Orders   Lipid panel (Completed)   Need for Streptococcus pneumoniae vaccination       Relevant Orders   Pneumococcal polysaccharide vaccine 23-valent greater than or equal to 2yo subcutaneous/IM (Completed)   Essential hypertension       Relevant Medications   triamterene-hydrochlorothiazide (MAXZIDE) 75-50 MG tablet      Follow up: Return in about 6 months (around 02/09/2021) for DM and HTN (F2F or video).  [name]

## 2020-08-11 NOTE — Assessment & Plan Note (Addendum)
Last hgbA1c of 6.9 Repeat HgbA1c, urine microalbumin and lipid panel up to date with eye exam and dental cleaning Normal foot exam today BP at goal Declined influenza vaccine Administered pneumovax 23 today  Normal urine microalbumin, and lipid panel. HgbA1c at 7.2 from 6.9: need to start metformin 500mg  daily. New rx sent. Normal renal and liver function F/up in 8months

## 2020-08-11 NOTE — Assessment & Plan Note (Signed)
Exercise: walking and stretching 90mins 3x/week Diet: not always compliant. Chronic back and knee pain Wt Readings from Last 3 Encounters:  08/11/20 223 lb 6.4 oz (101.3 kg)  05/31/20 227 lb 9.6 oz (103.2 kg)  05/30/20 226 lb (102.5 kg)   Advised about need for weight loss Advised to consider resume appts with nutritionist

## 2020-08-11 NOTE — Assessment & Plan Note (Signed)
BP at goal with losartan and maxzide Persistent LE edema, worse with prolong sitting and standing No SOB/CP/dizziness BP Readings from Last 3 Encounters:  08/11/20 126/80  05/31/20 120/74  05/30/20 104/74   Repeat BMP Maintain current medications

## 2020-08-15 ENCOUNTER — Ambulatory Visit (INDEPENDENT_AMBULATORY_CARE_PROVIDER_SITE_OTHER): Payer: BC Managed Care – PPO

## 2020-08-15 ENCOUNTER — Telehealth: Payer: Self-pay | Admitting: Nurse Practitioner

## 2020-08-15 DIAGNOSIS — J309 Allergic rhinitis, unspecified: Secondary | ICD-10-CM | POA: Diagnosis not present

## 2020-08-15 NOTE — Telephone Encounter (Signed)
Patient is calling in regards to her lab results. Please give her a call back at [phone].

## 2020-08-16 MED ORDER — METFORMIN HCL ER 500 MG PO TB24: 500.0000 mg | ORAL_TABLET | Freq: Every day | ORAL | 1 refills | Status: DC

## 2020-08-16 MED ORDER — POTASSIUM CHLORIDE CRYS ER 20 MEQ PO TBCR: 40.0000 meq | EXTENDED_RELEASE_TABLET | Freq: Once | ORAL | 0 refills | Status: DC

## 2020-08-16 NOTE — Telephone Encounter (Signed)
Patient notified and verbalized understanding. 

## 2020-08-16 NOTE — Telephone Encounter (Signed)
See results note. 

## 2020-08-18 ENCOUNTER — Other Ambulatory Visit (INDEPENDENT_AMBULATORY_CARE_PROVIDER_SITE_OTHER): Payer: BC Managed Care – PPO

## 2020-08-18 ENCOUNTER — Other Ambulatory Visit: Payer: Self-pay

## 2020-08-18 DIAGNOSIS — D649 Anemia, unspecified: Secondary | ICD-10-CM

## 2020-08-18 LAB — FECAL OCCULT BLOOD, IMMUNOCHEMICAL: Fecal Occult Bld: NEGATIVE

## 2020-08-18 NOTE — Addendum Note (Signed)
Addended by: Lynnea Ferrier on: 08/18/2020 09:46 AM   Modules accepted: Orders

## 2020-08-19 ENCOUNTER — Encounter: Payer: Self-pay | Admitting: Nurse Practitioner

## 2020-08-19 MED ORDER — TRIAMTERENE-HCTZ 75-50 MG PO TABS: 0.5000 | ORAL_TABLET | Freq: Every day | ORAL | 1 refills | Status: DC

## 2020-08-24 ENCOUNTER — Ambulatory Visit (INDEPENDENT_AMBULATORY_CARE_PROVIDER_SITE_OTHER): Payer: BC Managed Care – PPO

## 2020-08-24 DIAGNOSIS — J309 Allergic rhinitis, unspecified: Secondary | ICD-10-CM

## 2020-08-31 ENCOUNTER — Other Ambulatory Visit: Payer: Self-pay

## 2020-08-31 ENCOUNTER — Ambulatory Visit (INDEPENDENT_AMBULATORY_CARE_PROVIDER_SITE_OTHER): Payer: BC Managed Care – PPO | Admitting: Allergy and Immunology

## 2020-08-31 ENCOUNTER — Encounter: Payer: Self-pay | Admitting: Allergy and Immunology

## 2020-08-31 VITALS — BP 122/66 | HR 80 | Temp 97.2°F | Resp 20

## 2020-08-31 DIAGNOSIS — J3089 Other allergic rhinitis: Secondary | ICD-10-CM

## 2020-08-31 DIAGNOSIS — J4540 Moderate persistent asthma, uncomplicated: Secondary | ICD-10-CM | POA: Diagnosis not present

## 2020-08-31 MED ORDER — XHANCE 93 MCG/ACT NA EXHU: 2.0000 | INHALANT_SUSPENSION | Freq: Two times a day (BID) | NASAL | 3 refills | Status: DC | PRN

## 2020-08-31 NOTE — Progress Notes (Signed)
Follow-up Note  RE: Mackenzie Weber MRN: [id number] DOB: Mar 05, 1961 Date of Office Visit: 08/31/2020  Primary care provider: Flossie Buffy, NP Referring provider: Flossie Buffy, NP  History of present illness: Mackenzie Weber is a 59 y.o. female with persistent asthma and allergic rhinoconjunctivitis presenting today for follow-up.  She is previously seen in this clinic on May 30, 2020 for her initial evaluation.  She reports that in the interval since her previous visit her asthma has been well controlled on her current regimen.  She rarely requires albuterol rescue and denies limitations in normal daily activities and/or nocturnal awakenings due to lower respiratory symptoms.  She reports that she is still experiencing frequent nasal congestion and postnasal drainage at night.  However, she discontinued the azelastine nasal spray because she was using the wrong technique and getting a bitter taste on the back of her tongue.  She has been receiving immunotherapy buildup injections without problems or complications.  Assessment and plan: Moderate persistent asthma Well controlled.  For now, continue Advair 45-21 g, 2 inhalations via spacer device twice daily.  Continue montelukast 10 mg daily at bedtime.  Continue albuterol HFA, 1 to 2 inhalations every 4-6 hours if needed.  Perennial and seasonal allergic rhinitis  Continue appropriate aeroallergen avoidance measures and immunotherapy injections per protocol.  A prescription has been provided for [facility], 2 actuations per nostril twice a day as needed. Proper technique has been discussed and demonstrated.  Continue azelastine nasal spray, 1 to 2 sprays per nostril 1-2 times daily as needed.  Proper nasal spray technique has been discussed and demonstrated.  Nasal saline spray (i.e., Simply Saline) or nasal saline lavage (i.e., NeilMed) is recommended as needed and prior to medicated nasal sprays.  For thick post  nasal drainage, add guaifenesin [phone] mg (Mucinex)  twice daily as needed with adequate hydration as discussed.   Meds ordered this encounter  Medications  . Fluticasone Propionate (XHANCE) 93 MCG/ACT EXHU    Sig: Place 2 sprays into the nose 2 (two) times daily as needed.    Dispense:  16 mL    Refill:  3    Diagnostics: Spirometry reveals an FVC of 1.69 L and an FEV1 of 1.22 L (69% predicted) with an FEV1 ratio of 90%.  Mild restrictive pattern likely attributable to body habitus.  FEV1 is improved today compared with previous study.  Please see scanned spirometry results for details.    Physical examination: Blood pressure 122/66, pulse 80, temperature (!) 97.2 F (36.2 C), temperature source Tympanic, resp. rate 20, SpO2 99 %.  General: Alert, interactive, obese, in no acute distress. HEENT: TMs pearly gray, turbinates edematous with clear discharge, post-pharynx moderately erythematous. Neck: Supple without lymphadenopathy. Lungs: Clear to auscultation without wheezing, rhonchi or rales. CV: Normal S1, S2 without murmurs. Skin: Warm and dry, without lesions or rashes.  The following portions of the patient's history were reviewed and updated as appropriate: allergies, current medications, past family history, past medical history, past social history, past surgical history and problem list.  Current Outpatient Medications  Medication Sig Dispense Refill  . azelastine (ASTELIN) 0.1 % nasal spray 1-2 sprays  each nostril twice daily as needed 30 mL 5  . blood glucose meter kit and supplies KIT Check blood sugar once daily. One Touch Verio Reflect Meter 1 each 0  . esomeprazole (NEXIUM) 20 MG capsule Take 1 capsule (20 mg total) by mouth daily before breakfast. (Patient taking differently: Take 20 mg by  mouth as needed. ) 90 capsule 1  . estradiol (ESTRACE) 0.1 MG/GM vaginal cream Place vaginally.    . fluticasone-salmeterol (ADVAIR HFA) 45-21 MCG/ACT inhaler Inhale 2 puffs  into the lungs 2 (two) times daily. 1 Inhaler 0  . Lancets (ONETOUCH DELICA PLUS [id number]) MISC USE TO TEST BLOOD SUGAR ONCE DAILY    . levocetirizine (XYZAL) 5 MG tablet Take 1 tablet (5 mg total) by mouth daily as needed for allergies. 30 tablet 5  . losartan (COZAAR) 100 MG tablet Take 1 tablet (100 mg total) by mouth daily. 90 tablet 3  . metFORMIN (GLUCOPHAGE-XR) 500 MG 24 hr tablet Take 1 tablet (500 mg total) by mouth daily with breakfast. 90 tablet 1  . montelukast (SINGULAIR) 10 MG tablet Take 1 tablet (10 mg total) by mouth at bedtime. FOR SEASONAL ALLERGY 90 tablet 3  . Multiple Vitamin (MULTIVITAMIN) capsule Take 1 capsule by mouth daily.    . Olopatadine HCl (PATADAY) 0.2 % SOLN Place 1 drop into both eyes daily as needed. 2.5 mL 5  . ONETOUCH VERIO test strip USE TO TEST BLOOD SUGAR ONCE DAILY    . PROAIR HFA 108 (90 Base) MCG/ACT inhaler Inhale 2 puffs into the lungs every 6 (six) hours as needed for wheezing or shortness of breath. Inhale 2 puff into the lungs every 6 hours as needed for wheezing or shortness of breath 18 g 5  . triamterene-hydrochlorothiazide (MAXZIDE) 75-50 MG tablet Take 0.5 tablets by mouth daily. 45 tablet 1  . UNABLE TO FIND Med Name: immunotherapy    . EPINEPHrine (EPIPEN JR) 0.15 MG/0.3ML injection Inject 0.15 mg into the muscle daily as needed for anaphylaxis.  (Patient not taking: Reported on 08/31/2020)    . Fluticasone Propionate (XHANCE) 93 MCG/ACT EXHU Place 2 sprays into the nose 2 (two) times daily as needed. 16 mL 3   No current facility-administered medications for this visit.    Allergies  Allergen Reactions  . Cortisone     Red area around injection site x 51mh  . Depo-Medrol [Methylprednisolone Sodium Succ] Nausea Only and Other (See Comments)    Dizziness  . Aspirin Anxiety and Other (See Comments)    Rapid Heart beat    I appreciate the opportunity to take part in Mackenzie Weber's care. Please do not hesitate to contact me with  questions.  Sincerely,   R. CEdgar Frisk MD

## 2020-08-31 NOTE — Assessment & Plan Note (Signed)
Well controlled.  For now, continue Advair 45-21 g, 2 inhalations via spacer device twice daily.  Continue montelukast 10 mg daily at bedtime.  Continue albuterol HFA, 1 to 2 inhalations every 4-6 hours if needed.

## 2020-08-31 NOTE — Patient Instructions (Addendum)
Moderate persistent asthma Well controlled.  For now, continue Advair 45-21 g, 2 inhalations via spacer device twice daily.  Continue montelukast 10 mg daily at bedtime.  Continue albuterol HFA, 1 to 2 inhalations every 4-6 hours if needed.  Perennial and seasonal allergic rhinitis  Continue appropriate aeroallergen avoidance measures and immunotherapy injections per protocol.  A prescription has been provided for [facility], 2 actuations per nostril twice a day as needed. Proper technique has been discussed and demonstrated.  Continue azelastine nasal spray, 1 to 2 sprays per nostril 1-2 times daily as needed.  Proper nasal spray technique has been discussed and demonstrated.  Nasal saline spray (i.e., Simply Saline) or nasal saline lavage (i.e., NeilMed) is recommended as needed and prior to medicated nasal sprays.  For thick post nasal drainage, add guaifenesin [phone] mg (Mucinex)  twice daily as needed with adequate hydration as discussed.   Return in about 5 months (around 01/29/2021), or if symptoms worsen or fail to improve.

## 2020-08-31 NOTE — Assessment & Plan Note (Signed)
   Continue appropriate aeroallergen avoidance measures and immunotherapy injections per protocol.  A prescription has been provided for [facility], 2 actuations per nostril twice a day as needed. Proper technique has been discussed and demonstrated.  Continue azelastine nasal spray, 1 to 2 sprays per nostril 1-2 times daily as needed.  Proper nasal spray technique has been discussed and demonstrated.  Nasal saline spray (i.e., Simply Saline) or nasal saline lavage (i.e., NeilMed) is recommended as needed and prior to medicated nasal sprays.  For thick post nasal drainage, add guaifenesin [phone] mg (Mucinex)  twice daily as needed with adequate hydration as discussed.

## 2020-09-12 ENCOUNTER — Telehealth: Payer: Self-pay | Admitting: Nurse Practitioner

## 2020-09-12 DIAGNOSIS — M79672 Pain in left foot: Secondary | ICD-10-CM

## 2020-09-12 DIAGNOSIS — M79671 Pain in right foot: Secondary | ICD-10-CM

## 2020-09-12 NOTE — Telephone Encounter (Signed)
Patient is calling to get a referral for Podiatry. She states it was discussed at last visit. Please give her a call back on [phone] if you have any questions.

## 2020-09-12 NOTE — Telephone Encounter (Signed)
Please see message below

## 2020-09-13 NOTE — Telephone Encounter (Signed)
Referral placed and patient notified.  

## 2020-09-13 NOTE — Telephone Encounter (Signed)
Pt state she would like a referral to a new podiatrist within the [facility] because she does not want to go back to the one she seen in the past. Please advise

## 2020-09-13 NOTE — Addendum Note (Signed)
Addended by: Renette Butters on: 09/13/2020 11:04 AM   Modules accepted: Orders

## 2020-09-13 NOTE — Telephone Encounter (Signed)
Ok to enter podiatry referral. Associated to bilateral foot pain

## 2020-09-16 ENCOUNTER — Ambulatory Visit (INDEPENDENT_AMBULATORY_CARE_PROVIDER_SITE_OTHER): Payer: BC Managed Care – PPO

## 2020-09-16 DIAGNOSIS — J309 Allergic rhinitis, unspecified: Secondary | ICD-10-CM

## 2020-09-19 ENCOUNTER — Telehealth: Payer: Self-pay | Admitting: Allergy and Immunology

## 2020-09-19 NOTE — Telephone Encounter (Signed)
pt request antibiotics for sinus congestion. Medication from 11/17 is not working.   [phone], Judson Roch

## 2020-09-19 NOTE — Telephone Encounter (Signed)
Follow up call to patient. She reports severe sinus congestion, cloudy/white nasal drainage that is worse at night, ears stopped up, says that her ears actually "popped" yesterday and she was having some issues with her equilibrium and hearing, severe sinus headache and head pressure. Patient is taking all medications as prescribed, including sinus rinse, nasal sprays, mucinex and antihistamines. Please advise on further instructions for patient and thank you.

## 2020-09-19 NOTE — Telephone Encounter (Signed)
Denied fever/chills/body aches. Just the symptoms reported.

## 2020-09-19 NOTE — Telephone Encounter (Signed)
Any fever or chill?

## 2020-09-20 ENCOUNTER — Other Ambulatory Visit: Payer: Self-pay

## 2020-09-20 MED ORDER — PREDNISONE 10 MG PO TABS: ORAL_TABLET | ORAL | 0 refills | Status: DC

## 2020-09-20 NOTE — Telephone Encounter (Signed)
Pt informed of prednisone sent in to pharmacy and will give [place] an update in a few days

## 2020-09-20 NOTE — Telephone Encounter (Signed)
Please call in prednisone, 40 mg x3 days, 20 mg x1 day, 10 mg x1 day, then stop. If the patient t fevers, chills, and/or fetid mucus production, we will call in an antibiotic. Thanks.

## 2020-09-22 NOTE — Telephone Encounter (Signed)
Went over the directions with pt and he pahrmacy mess up on the directions but she was given the right about 15 tablets according to dr Starling Manns directions

## 2020-09-22 NOTE — Telephone Encounter (Signed)
Patient states that she was not given enough prednisone tablets. Patient says she still needs 10 tablets.  Please advise.

## 2020-09-26 ENCOUNTER — Other Ambulatory Visit: Payer: Self-pay

## 2020-09-26 ENCOUNTER — Ambulatory Visit (INDEPENDENT_AMBULATORY_CARE_PROVIDER_SITE_OTHER): Payer: BC Managed Care – PPO | Admitting: Podiatry

## 2020-09-26 ENCOUNTER — Ambulatory Visit (INDEPENDENT_AMBULATORY_CARE_PROVIDER_SITE_OTHER): Payer: BC Managed Care – PPO

## 2020-09-26 ENCOUNTER — Encounter: Payer: Self-pay | Admitting: Podiatry

## 2020-09-26 DIAGNOSIS — M779 Enthesopathy, unspecified: Secondary | ICD-10-CM

## 2020-09-26 DIAGNOSIS — M79672 Pain in left foot: Secondary | ICD-10-CM

## 2020-09-26 DIAGNOSIS — M79671 Pain in right foot: Secondary | ICD-10-CM | POA: Diagnosis not present

## 2020-09-26 DIAGNOSIS — R600 Localized edema: Secondary | ICD-10-CM | POA: Diagnosis not present

## 2020-09-27 ENCOUNTER — Ambulatory Visit (INDEPENDENT_AMBULATORY_CARE_PROVIDER_SITE_OTHER): Payer: BC Managed Care – PPO

## 2020-09-27 DIAGNOSIS — J309 Allergic rhinitis, unspecified: Secondary | ICD-10-CM

## 2020-09-28 ENCOUNTER — Other Ambulatory Visit: Payer: Self-pay | Admitting: Nurse Practitioner

## 2020-09-28 ENCOUNTER — Other Ambulatory Visit: Payer: Self-pay | Admitting: Podiatry

## 2020-09-28 DIAGNOSIS — M779 Enthesopathy, unspecified: Secondary | ICD-10-CM

## 2020-09-28 NOTE — Progress Notes (Signed)
Subjective:   Patient ID: Mackenzie Weber, female   DOB: 59 y.o.   MRN: [id number]   HPI Patient states she had a lot of pain in her feet and it somewhat improved but is still present and she is concerned because she is getting swelling in her midfoot and ankles bilateral and has had this checked but nothing has been found out to this point.  Patient does not smoke likes to be active   Review of Systems  All other systems reviewed and are negative.       Objective:  Physical Exam Vitals and nursing note reviewed.  Constitutional:      Appearance: She is well-developed and well-nourished.  Cardiovascular:     Pulses: Intact distal pulses.  Pulmonary:     Effort: Pulmonary effort is normal.  Musculoskeletal:        General: Normal range of motion.  Skin:    General: Skin is warm.  Neurological:     Mental Status: She is alert.     Neurovascular status was found to be intact muscle strength was found to be adequate range of motion adequate.  Patient is noted to have discomfort in both feet with inflammation noted of a low-grade nature with no specific area with obesity is complicating factor and nonpitting edema in the midfoot and into the ankle of both feet.  Patient is found to have good digital perfusion well oriented x3     Assessment:  Obesity is complicating factor with low-grade tendinitis inflammation bilateral with moderate swelling of the midfoot with negative Bevelyn Buckles' sign noted bilateral     Plan:  H&P x-rays reviewed conditions discussed and at this point since there is no true pinpoint tenderness there is no active treatment I can do.  I did discuss elevation and I applied ankle compression stockings to try to reduce swelling and patient will be seen back if symptoms get worse  X-rays were negative for signs of fracture or bone issue appears to be soft tissue

## 2020-09-29 NOTE — Telephone Encounter (Signed)
Last OV 08/11/20

## 2020-10-12 ENCOUNTER — Telehealth: Payer: Self-pay | Admitting: Nurse Practitioner

## 2020-10-12 DIAGNOSIS — E1165 Type 2 diabetes mellitus with hyperglycemia: Secondary | ICD-10-CM

## 2020-10-12 NOTE — Telephone Encounter (Signed)
Patient is calling to get a referral to [facility]. She said that she has been there before, but her referral has expired. Please call her at [phone] if you have any questions.

## 2020-10-13 ENCOUNTER — Ambulatory Visit (INDEPENDENT_AMBULATORY_CARE_PROVIDER_SITE_OTHER): Payer: BC Managed Care – PPO

## 2020-10-13 DIAGNOSIS — J309 Allergic rhinitis, unspecified: Secondary | ICD-10-CM | POA: Diagnosis not present

## 2020-10-15 ENCOUNTER — Other Ambulatory Visit: Payer: Self-pay

## 2020-10-15 ENCOUNTER — Encounter (HOSPITAL_BASED_OUTPATIENT_CLINIC_OR_DEPARTMENT_OTHER): Payer: Self-pay | Admitting: Emergency Medicine

## 2020-10-15 DIAGNOSIS — Z79899 Other long term (current) drug therapy: Secondary | ICD-10-CM | POA: Insufficient documentation

## 2020-10-15 DIAGNOSIS — I10 Essential (primary) hypertension: Secondary | ICD-10-CM | POA: Diagnosis not present

## 2020-10-15 DIAGNOSIS — X58XXXA Exposure to other specified factors, initial encounter: Secondary | ICD-10-CM | POA: Insufficient documentation

## 2020-10-15 DIAGNOSIS — Z7951 Long term (current) use of inhaled steroids: Secondary | ICD-10-CM | POA: Insufficient documentation

## 2020-10-15 DIAGNOSIS — Z7984 Long term (current) use of oral hypoglycemic drugs: Secondary | ICD-10-CM | POA: Insufficient documentation

## 2020-10-15 DIAGNOSIS — T1512XA Foreign body in conjunctival sac, left eye, initial encounter: Secondary | ICD-10-CM | POA: Diagnosis present

## 2020-10-15 DIAGNOSIS — E119 Type 2 diabetes mellitus without complications: Secondary | ICD-10-CM | POA: Insufficient documentation

## 2020-10-15 DIAGNOSIS — J45909 Unspecified asthma, uncomplicated: Secondary | ICD-10-CM | POA: Diagnosis not present

## 2020-10-15 DIAGNOSIS — Z5321 Procedure and treatment not carried out due to patient leaving prior to being seen by health care provider: Secondary | ICD-10-CM | POA: Insufficient documentation

## 2020-10-15 DIAGNOSIS — T1592XA Foreign body on external eye, part unspecified, left eye, initial encounter: Secondary | ICD-10-CM | POA: Diagnosis not present

## 2020-10-15 NOTE — ED Triage Notes (Signed)
Reports she was eating popcorn and rubbed her left eye.  Now feels like something is in it.  Has tried flushing with no relief.

## 2020-10-16 ENCOUNTER — Emergency Department (HOSPITAL_BASED_OUTPATIENT_CLINIC_OR_DEPARTMENT_OTHER)
Admission: EM | Admit: 2020-10-16 | Discharge: 2020-10-16 | Disposition: A | Discharge status: Home / Self Care | Payer: BC Managed Care – PPO | Attending: Emergency Medicine | Admitting: Emergency Medicine

## 2020-10-16 ENCOUNTER — Encounter (HOSPITAL_BASED_OUTPATIENT_CLINIC_OR_DEPARTMENT_OTHER): Payer: Self-pay

## 2020-10-16 ENCOUNTER — Emergency Department (HOSPITAL_BASED_OUTPATIENT_CLINIC_OR_DEPARTMENT_OTHER)
Admission: EM | Admit: 2020-10-16 | Discharge: 2020-10-16 | Disposition: A | Discharge status: Home / Self Care | Payer: BC Managed Care – PPO | Source: Home / Self Care | Attending: Emergency Medicine | Admitting: Emergency Medicine

## 2020-10-16 DIAGNOSIS — J45909 Unspecified asthma, uncomplicated: Secondary | ICD-10-CM | POA: Insufficient documentation

## 2020-10-16 DIAGNOSIS — I10 Essential (primary) hypertension: Secondary | ICD-10-CM | POA: Insufficient documentation

## 2020-10-16 DIAGNOSIS — Z7984 Long term (current) use of oral hypoglycemic drugs: Secondary | ICD-10-CM | POA: Insufficient documentation

## 2020-10-16 DIAGNOSIS — Z7951 Long term (current) use of inhaled steroids: Secondary | ICD-10-CM | POA: Insufficient documentation

## 2020-10-16 DIAGNOSIS — T1592XA Foreign body on external eye, part unspecified, left eye, initial encounter: Secondary | ICD-10-CM

## 2020-10-16 DIAGNOSIS — Z79899 Other long term (current) drug therapy: Secondary | ICD-10-CM | POA: Insufficient documentation

## 2020-10-16 DIAGNOSIS — X58XXXA Exposure to other specified factors, initial encounter: Secondary | ICD-10-CM | POA: Insufficient documentation

## 2020-10-16 DIAGNOSIS — E119 Type 2 diabetes mellitus without complications: Secondary | ICD-10-CM | POA: Insufficient documentation

## 2020-10-16 MED ORDER — ERYTHROMYCIN 5 MG/GM OP OINT: TOPICAL_OINTMENT | OPHTHALMIC | 0 refills | Status: DC

## 2020-10-16 MED ORDER — TETRACAINE HCL 0.5 % OP SOLN
2.0000 [drp] | Freq: Once | OPHTHALMIC | Status: AC
  Administered 2020-10-16: 2 [drp] via OPHTHALMIC
  Filled 2020-10-16: qty 4

## 2020-10-16 MED ORDER — FLUORESCEIN SODIUM 1 MG OP STRP
1.0000 | ORAL_STRIP | Freq: Once | OPHTHALMIC | Status: AC
  Administered 2020-10-16: 1 via OPHTHALMIC
  Filled 2020-10-16: qty 1

## 2020-10-16 NOTE — ED Triage Notes (Signed)
Pt states left eye irritation since last night, states may have rubbed debris into eye, red irritated.   Denies vision changes

## 2020-10-16 NOTE — ED Provider Notes (Signed)
[facility] EMERGENCY DEPARTMENT Provider Note   CSN: [id number] Arrival date & time: 10/16/20  6387     History Chief Complaint  Patient presents with  . Eye Problem    Mackenzie Weber is a 60 y.o. female.  Patient is a 60 year old female with a history of diabetes, hypertension and asthma who presents with eye irritation.  She feels like she may have gotten something in her eye last night when she was eating some popcorn.  She says is been irritated since that time.  Has been watering.  No other discharge.  She denies any vision changes.  No associated headache.  She does not wear contacts but she does wear glasses.        Past Medical History:  Diagnosis Date  . Anemia   . Asthma   . Diabetes mellitus without complication ([place])   . Foot pain, right   . Hypertension   . IBS (irritable bowel syndrome)   . Sciatica of left side   . Seasonal allergies     Patient Active Problem List   Diagnosis Date Noted  . Allergic conjunctivitis 05/30/2020  . Moderate persistent asthma 05/30/2020  . Grade I diastolic dysfunction 56/43/3295  . Bilateral foot pain 01/13/2020  . Extensor tendonitis of foot 01/13/2020  . BMI 40.0-44.9, adult ([place]) 12/23/2019  . Type 2 diabetes mellitus with hyperglycemia, without long-term current use of insulin ([place]) 01/01/2019  . Perennial and seasonal allergic rhinitis 05/20/2018  . Chronic bilateral low back pain with left-sided sciatica 05/20/2018  . Lipoma of left upper extremity 01/29/2018  . Muscle spasm of left lower extremity 01/29/2018  . Intestinal ulcer 04/23/2017  . Bilateral lower extremity edema 04/23/2017  . GERD (gastroesophageal reflux disease) 03/28/2016  . History of colon polyps 03/28/2016  . Anemia 03/28/2016  . Somatic dysfunction of cervical region 11/16/2015  . Chronic sciatica 01/28/2015  . Somatic dysfunction of pelvic region 01/28/2015  . Chronic pelvic pain in female 01/28/2015  . Somatic dysfunction of  lower extremity 01/28/2015  . Somatic dysfunction of sacral region 01/28/2015  . Hypertensive disorder 10/22/2014  . Morbid obesity due to excess calories ([place]) 10/22/2014  . Abdominal muscle strain 06/07/2014  . Sciatica of left side 11/03/2012    Past Surgical History:  Procedure Laterality Date  . ABDOMINAL HYSTERECTOMY    . APPENDECTOMY    . BLADDER SUSPENSION  2012   TVT  . BREAST SURGERY  2001   /biopsy-benign  . CHOLECYSTECTOMY    . EXCISIONAL HEMORRHOIDECTOMY    . KNEE SURGERY     right  . SHOULDER SURGERY Left 02/13/2018   Lipoma removed   . Small Bowel Surgery    . TONSILLECTOMY    . TOTAL ABDOMINAL HYSTERECTOMY W/ BILATERAL SALPINGOOPHORECTOMY  1989   LSO-1987; [id number]  . WRIST SURGERY     carpal tunnel repair     OB History    Gravida  2   Para  2   Term      Preterm      AB      Living        SAB      IAB      Ectopic      Multiple      Live Births              Family History  Problem Relation Age of Onset  . Diabetes Father   . Hypertension Father   . Heart disease Father   .  Diabetes Mother   . Hypertension Mother   . Asthma Mother   . Heart disease Mother   . Allergic rhinitis Mother   . Diabetes Brother   . Hypertension Sister   . Diabetes Sister   . Cancer Sister 30       breast  . Hypertension Paternal Uncle     Social History   Tobacco Use  . Smoking status: Never Smoker  . Smokeless tobacco: Never Used  Vaping Use  . Vaping Use: Never used  Substance Use Topics  . Alcohol use: No  . Drug use: No    Home Medications Prior to Admission medications   Medication Sig Start Date End Date Taking? Authorizing Provider  erythromycin ophthalmic ointment Place a 1/2 inch ribbon of ointment into the lower eyelid four times daily for 5 days 10/16/20  Yes Malvin Johns, MD  azelastine (ASTELIN) 0.1 % nasal spray 1-2 sprays  each nostril twice daily as needed 05/30/20   Bobbitt, Sedalia Muta, MD  blood glucose meter  kit and supplies KIT Check blood sugar once daily. One Touch Verio Reflect Meter 06/30/19   Nche, Charlene Brooke, NP  EPINEPHrine (EPIPEN JR) 0.15 MG/0.3ML injection Inject 0.15 mg into the muscle daily as needed for anaphylaxis.  Patient not taking: Reported on 08/31/2020    [provider]  esomeprazole (NEXIUM) 20 MG capsule Take 1 capsule (20 mg total) by mouth daily before breakfast. Patient taking differently: Take 20 mg by mouth as needed.  12/23/19   Nche, Charlene Brooke, NP  estradiol (ESTRACE) 0.1 MG/GM vaginal cream Place vaginally. 05/10/20   [provider]  Fluticasone Propionate (XHANCE) 93 MCG/ACT EXHU Place 2 sprays into the nose 2 (two) times daily as needed. 08/31/20   Bobbitt, Sedalia Muta, MD  fluticasone-salmeterol (ADVAIR HFA) [phone] MCG/ACT inhaler Inhale 2 puffs into the lungs 2 (two) times daily. 11/25/19   Nche, Charlene Brooke, NP  Lancets (ONETOUCH DELICA PLUS [id number]) MISC USE TO TEST BLOOD SUGAR ONCE DAILY 09/29/20   Nche, Charlene Brooke, NP  levocetirizine (XYZAL) 5 MG tablet Take 1 tablet (5 mg total) by mouth daily as needed for allergies. 05/30/20   Bobbitt, Sedalia Muta, MD  losartan (COZAAR) 100 MG tablet Take 1 tablet (100 mg total) by mouth daily. 02/05/20   Nche, Charlene Brooke, NP  metFORMIN (GLUCOPHAGE-XR) 500 MG 24 hr tablet Take 1 tablet (500 mg total) by mouth daily with breakfast. 08/16/20   Nche, Charlene Brooke, NP  montelukast (SINGULAIR) 10 MG tablet Take 1 tablet (10 mg total) by mouth at bedtime. FOR SEASONAL ALLERGY 12/21/19   Nche, Charlene Brooke, NP  Multiple Vitamin (MULTIVITAMIN) capsule Take 1 capsule by mouth daily.    [provider]  Olopatadine HCl (PATADAY) 0.2 % SOLN Place 1 drop into both eyes daily as needed. 05/30/20   Bobbitt, Sedalia Muta, MD  [facility] VERIO test strip USE TO TEST BLOOD SUGAR ONCE DAILY 09/29/20   Nche, Charlene Brooke, NP  predniSONE (DELTASONE) 10 MG tablet Take 4 tablets daily for 3 days, then 2 tablet on  days 4, then 1 tablet on day 5 then stop 09/20/20   Bobbitt, Sedalia Muta, MD  PROAIR HFA 108 [phone] Base) MCG/ACT inhaler Inhale 2 puffs into the lungs every 6 (six) hours as needed for wheezing or shortness of breath. Inhale 2 puff into the lungs every 6 hours as needed for wheezing or shortness of breath 03/18/18   Gildardo Cranker, DO  triamterene-hydrochlorothiazide (MAXZIDE) 75-50 MG tablet Take  0.5 tablets by mouth daily. 08/19/20   Nche, Charlene Brooke, NP  UNABLE TO FIND Med Name: immunotherapy    [provider]    Allergies    Cortisone, Depo-medrol [methylprednisolone sodium succ], and Aspirin  Review of Systems   Review of Systems  Constitutional: Negative for fever.  HENT: Negative for facial swelling.   Eyes: Positive for pain, discharge and redness. Negative for photophobia, itching and visual disturbance.  Gastrointestinal: Negative for nausea and vomiting.  Skin: Negative for rash and wound.  Neurological: Negative for headaches.    Physical Exam Updated Vital Signs BP 109/67 (BP Location: Right Arm)   Pulse 74   Temp 98.3 F (36.8 C) (Oral)   Resp 18   Ht 5' (1.524 m)   Wt 102.5 kg   SpO2 98%   BMI 44.14 kg/m   Physical Exam Constitutional:      Appearance: Normal appearance.  HENT:     Head: Normocephalic and atraumatic.  Eyes:     General: Lids are normal. Vision grossly intact. Gaze aligned appropriately.        Left eye: Foreign body (Small black speck removed from underneath the upper left eyelid) and discharge (Watery discharge) present.    Extraocular Movements: Extraocular movements intact.     Conjunctiva/sclera:     Left eye: Left conjunctiva is not injected (Mild erythema to the left conjunctive a). No exudate or hemorrhage.    Pupils: Pupils are equal, round, and reactive to light.     Comments: Woods lamp with fluorescein exam did not reveal any staining areas.  Slit-lamp not available  Skin:    Comments: No skin rashes around the eye   Neurological:     Mental Status: She is alert.     ED Results / Procedures / Treatments   Labs (all labs ordered are listed, but only abnormal results are displayed) Labs Reviewed - No data to display  EKG None  Radiology No results found.  Procedures Procedures (including critical care time)  Medications Ordered in ED Medications  fluorescein ophthalmic strip 1 strip (1 strip Left Eye Given 10/16/20 0918)  tetracaine (PONTOCAINE) 0.5 % ophthalmic solution 2 drop (2 drops Left Eye Given by Other 10/16/20 7048)    ED Course  I have reviewed the triage vital signs and the nursing notes.  Pertinent labs & imaging results that were available during my care of the patient were reviewed by me and considered in my medical decision making (see chart for details).    MDM Rules/Calculators/A&P                          Patient presents with some irritation and foreign body sensation to left eye.  She has some watery discharge but no erythema to the eye.  No rashes that would be more concerning for herpetic lesions.  She had no staining areas on Woods lamp exam.  His small foreign body was removed from underneath the left upper eyelid.  Her symptoms have resolved after tetracaine and removal of the foreign body.  She was discharged home in good condition.  She was placed on 5 days of erythromycin ointment in case she has a small unidentified corneal abrasion.  She does have an eye doctor and I advised her to follow-up with her eye doctor in the next 1 to 2 days for complete eye exam if her symptoms have not resolved. Final Clinical Impression(s) / ED Diagnoses Final diagnoses:  Eye foreign body, left, initial encounter    Rx / DC Orders ED Discharge Orders         Ordered    erythromycin ophthalmic ointment        10/16/20 0952           Malvin Johns, MD 10/16/20 [phone]

## 2020-10-17 NOTE — Patient Instructions (Incomplete)
Moderate persistent asthma Continue Advair 45/21 mcg 2 puffs twice a day with spacer to help prevent cough and wheeze. Once finished change to Symbicort 80/4.5 mcg 2 puffs twice a day with spacer. Make sure and use this medication every day Start montelukast 10 mg at night to help prevent cough and wheeze Patient cautioned that rarely some children/adults can experience behavioral changes after beginning montelukast. These side effects are rare, however, if you notice any change, notify the clinic and discontinue montelukast. May use albuterol 2 puffs every 4-6 hours as needed for cough, wheeze, tightness in chest, or shortness of breath. Use your albuterol (ProAir) just as needed  Perennial and seasonal allergic rhinitis Continue XHANCE 2 sprays each nostril twice a day as needed for nasal congestion Start azelastine nasal spray using 1 to 2 sprays each nostril 1-2 times a day as needed for runny nose/drainage May use saline nasal spray or saline nasal rinse as needed.  Use this prior to any medicated nasal sprays For thick postnasal drip may use Mucinex 600 to 1200 mg twice a day as needed.  Be sure to drink plenty of fluids Continue buildup of environmental allergy injections  Please let us know if this treatment plan is not working well for you Schedule a follow-up appointment in 3 months

## 2020-10-18 ENCOUNTER — Other Ambulatory Visit: Payer: Self-pay

## 2020-10-18 ENCOUNTER — Encounter: Payer: Self-pay | Admitting: Family

## 2020-10-18 ENCOUNTER — Ambulatory Visit (INDEPENDENT_AMBULATORY_CARE_PROVIDER_SITE_OTHER): Payer: BC Managed Care – PPO | Admitting: Family

## 2020-10-18 VITALS — BP 124/68 | HR 82 | Temp 97.9°F | Resp 20

## 2020-10-18 DIAGNOSIS — J3089 Other allergic rhinitis: Secondary | ICD-10-CM | POA: Diagnosis not present

## 2020-10-18 DIAGNOSIS — J309 Allergic rhinitis, unspecified: Secondary | ICD-10-CM

## 2020-10-18 DIAGNOSIS — H1013 Acute atopic conjunctivitis, bilateral: Secondary | ICD-10-CM

## 2020-10-18 DIAGNOSIS — J4540 Moderate persistent asthma, uncomplicated: Secondary | ICD-10-CM | POA: Diagnosis not present

## 2020-10-18 MED ORDER — MONTELUKAST SODIUM 10 MG PO TABS: ORAL_TABLET | ORAL | 1 refills | Status: DC

## 2020-10-18 MED ORDER — XHANCE 93 MCG/ACT NA EXHU: 2.0000 | INHALANT_SUSPENSION | Freq: Two times a day (BID) | NASAL | 1 refills | Status: DC | PRN

## 2020-10-18 MED ORDER — AZELASTINE HCL 0.1 % NA SOLN: NASAL | 1 refills | Status: DC

## 2020-10-18 MED ORDER — EPINEPHRINE 0.3 MG/0.3ML IJ SOAJ: 0.3000 mg | INTRAMUSCULAR | 1 refills | Status: AC | PRN

## 2020-10-18 NOTE — Progress Notes (Addendum)
[address] [place] [address] Dept: [phone]  FOLLOW UP NOTE  Patient ID: Mackenzie Weber, female    DOB: 10-Feb-1961  Age: 60 y.o. MRN: [id number] Date of Office Visit: 10/18/2020  Assessment  Chief Complaint: Asthma  HPI Mackenzie Weber is a 60 year old female who presents today for follow-up of moderate persistent asthma, and perennial and seasonal allergic rhinitis.  She was last seen on August 31, 2020 by Dr. Verlin Fester.  Moderate persistent asthma is reported as moderately controlled with Advair 45/21 mcg and albuterol as needed.  She reports that she does not use her Advair every day, but for the past 3 weeks she has been using 2 puffs twice a day with a spacer.  Her insurance is no longer covering Advair, so whenever she finishes Advair 45/21 mcg she will be transitioning to Symbicort 80/4.5 mcg 2 puffs twice a day with a spacer.  She is not taking montelukast 10 mg daily due to not remembering having this medication.  She has been using her albuterol 1 to 2 puffs a day thinking it was needed to be used that way.  Instructed her on the proper use of when to use albuterol.  She reports occasional dry cough and denies any shortness of breath, tightness in her chest, wheezing, and nocturnal awakenings.  Since her last office visit she has not required any trips to the emergency room or urgent care due to breathing problems.  She was given 1 round of steroids few weeks ago for her sinuses.  Perennial and seasonal allergic rhinitis is reported as moderately controlled with XHANCE 2 sprays each nostril twice a day as needed and on buildup of environmental allergy injections.  She is currently not using azelastine nasal spray montelukast 10 mg.  She reports postnasal drip and "wheezing sound" in throat and head and denies rhinorrhea, nasal congestion, and sinus tenderness.  She denies any large local reactions with allergy injections.   Drug Allergies:  Allergies  Allergen  Reactions  . Cortisone     Red area around injection site x 45mth  . Depo-Medrol [Methylprednisolone Sodium Succ] Nausea Only and Other (See Comments)    Dizziness  . Aspirin Anxiety and Other (See Comments)    Rapid Heart beat    Review of Systems: Review of Systems  Constitutional: Negative for chills and fever.  HENT:       Reports postnasal drip and denies rhinorrhea, sinus tenderness, and nasal congestion.  Eyes:       Denies itchy eyes  Respiratory: Positive for cough. Negative for shortness of breath and wheezing.        Reports occasional dry cough  Cardiovascular: Negative for chest pain and palpitations.  Gastrointestinal: Negative for abdominal pain and heartburn.  Genitourinary: Negative for dysuria.  Skin: Negative for itching and rash.  Neurological: Negative for headaches.    Physical Exam: BP 124/68 (BP Location: Right Arm, Patient Position: Sitting, Cuff Size: Normal)   Pulse 82   Temp 97.9 F (36.6 C) (Tympanic)   Resp 20   SpO2 99%    Physical Exam Constitutional:      Appearance: Normal appearance.  HENT:     Head: Normocephalic and atraumatic.     Comments: Pharynx normal, eyes normal, ears normal, nose bilateral lower turbinates moderately edematous with no drainage noted    Right Ear: Tympanic membrane, ear canal and external ear normal.     Left Ear: Tympanic membrane, ear canal and external ear normal.  Mouth/Throat:     Mouth: Mucous membranes are moist.     Pharynx: Oropharynx is clear.  Eyes:     Conjunctiva/sclera: Conjunctivae normal.  Cardiovascular:     Rate and Rhythm: Regular rhythm.     Heart sounds: Normal heart sounds.  Pulmonary:     Effort: Pulmonary effort is normal.     Breath sounds: Normal breath sounds.     Comments: Lungs clear to auscultation Musculoskeletal:     Cervical back: Neck supple.  Skin:    General: Skin is warm.  Neurological:     Mental Status: She is alert and oriented to person, place, and time.   Psychiatric:        Mood and Affect: Mood normal.        Behavior: Behavior normal.        Thought Content: Thought content normal.        Judgment: Judgment normal.     Diagnostics: FVC 1.60 L, FEV1 1.19 L. Predicted FVC 2.26 L, FEV1 1.78 L. Spirometry indicates mild restriction.  Spirometry is consistent with previous spirometry  Assessment and Plan: 1. Moderate persistent asthma, unspecified whether complicated   2. Perennial and seasonal allergic rhinitis   3. Allergic conjunctivitis of both eyes     No orders of the defined types were placed in this encounter.   Patient Instructions  Moderate persistent asthma Continue Advair 45/21 mcg 2 puffs twice a day with spacer to help prevent cough and wheeze. Once finished change to Symbicort 80/4.5 mcg 2 puffs twice a day with spacer. Make sure and use this medication every day Start montelukast 10 mg at night to help prevent cough and wheeze Patient cautioned that rarely some children/adults can experience behavioral changes after beginning montelukast. These side effects are rare, however, if you notice any change, notify the clinic and discontinue montelukast. May use albuterol 2 puffs every 4-6 hours as needed for cough, wheeze, tightness in chest, or shortness of breath. Use your albuterol (ProAir) just as needed  Perennial and seasonal allergic rhinitis Continue XHANCE 2 sprays each nostril twice a day as needed for nasal congestion Start azelastine nasal spray using 1 to 2 sprays each nostril 1-2 times a day as needed for runny nose/drainage May use saline nasal spray or saline nasal rinse as needed.  Use this prior to any medicated nasal sprays For thick postnasal drip may use Mucinex 600 to 1200 mg twice a day as needed.  Be sure to drink plenty of fluids Continue buildup of environmental allergy injections  Please let us know if this treatment plan is not working well for you Schedule a follow-up appointment in 3  months   Return in about 3 months (around 01/16/2021), or if symptoms worsen or fail to improve.    Thank you for the opportunity to care for this patient.  Please do not hesitate to contact me with questions.  Nehemiah Settle, FNP [facility]  ________________________________________________  I have provided oversight concerning Wynona Canes Jaqwan Wieber's evaluation and treatment of this patient's health issues addressed during today's encounter.  I agree with the assessment and therapeutic plan as outlined in the note.   Signed,   R Jorene Guest, MD

## 2020-10-21 ENCOUNTER — Other Ambulatory Visit: Payer: Self-pay

## 2020-10-21 ENCOUNTER — Telehealth: Payer: Self-pay | Admitting: Family

## 2020-10-21 DIAGNOSIS — J302 Other seasonal allergic rhinitis: Secondary | ICD-10-CM

## 2020-10-21 MED ORDER — AZELASTINE HCL 0.1 % NA SOLN: 2.0000 | Freq: Two times a day (BID) | NASAL | 5 refills | Status: DC | PRN

## 2020-10-21 MED ORDER — MONTELUKAST SODIUM 10 MG PO TABS: 10.0000 mg | ORAL_TABLET | Freq: Every day | ORAL | 5 refills | Status: DC

## 2020-10-21 NOTE — Telephone Encounter (Signed)
Pt. States both meds that were prescribed are too expensive,would like a substitute if possible.

## 2020-10-21 NOTE — Telephone Encounter (Signed)
Spoke with pt she does not remember which meds was too expensive she will contact the pharmacy and then let us know which two it was.

## 2020-10-21 NOTE — Telephone Encounter (Signed)
Pt called back montelukast was $159 and azelastine was $101 sent in 1 month supply to [facility] to see if that was cheaper.

## 2020-10-26 ENCOUNTER — Ambulatory Visit (INDEPENDENT_AMBULATORY_CARE_PROVIDER_SITE_OTHER): Payer: BC Managed Care – PPO

## 2020-10-26 DIAGNOSIS — J309 Allergic rhinitis, unspecified: Secondary | ICD-10-CM

## 2020-11-03 ENCOUNTER — Ambulatory Visit (INDEPENDENT_AMBULATORY_CARE_PROVIDER_SITE_OTHER): Payer: BC Managed Care – PPO

## 2020-11-03 DIAGNOSIS — J309 Allergic rhinitis, unspecified: Secondary | ICD-10-CM | POA: Diagnosis not present

## 2020-11-10 NOTE — Telephone Encounter (Signed)
Patients was seen on 10-18-2020 her issues are still ongoing patients states she is getting worse please advise

## 2020-11-15 NOTE — Telephone Encounter (Signed)
Pt. Called back because she had not heard anything from previous msg. She made an appt for 2/2 but asking if she can do anything tonight for wheezing.

## 2020-11-16 ENCOUNTER — Encounter: Payer: Self-pay | Admitting: Family Medicine

## 2020-11-16 ENCOUNTER — Ambulatory Visit (INDEPENDENT_AMBULATORY_CARE_PROVIDER_SITE_OTHER): Payer: BC Managed Care – PPO | Admitting: Family Medicine

## 2020-11-16 ENCOUNTER — Other Ambulatory Visit: Payer: Self-pay

## 2020-11-16 VITALS — BP 122/70 | HR 68 | Temp 97.7°F | Resp 20

## 2020-11-16 DIAGNOSIS — J4541 Moderate persistent asthma with (acute) exacerbation: Secondary | ICD-10-CM

## 2020-11-16 DIAGNOSIS — K219 Gastro-esophageal reflux disease without esophagitis: Secondary | ICD-10-CM | POA: Diagnosis not present

## 2020-11-16 DIAGNOSIS — H1013 Acute atopic conjunctivitis, bilateral: Secondary | ICD-10-CM | POA: Diagnosis not present

## 2020-11-16 DIAGNOSIS — J3089 Other allergic rhinitis: Secondary | ICD-10-CM | POA: Diagnosis not present

## 2020-11-16 MED ORDER — OMEPRAZOLE 20 MG PO CPDR: 20.0000 mg | DELAYED_RELEASE_CAPSULE | Freq: Two times a day (BID) | ORAL | 5 refills | Status: DC

## 2020-11-16 MED ORDER — PROAIR HFA 108 (90 BASE) MCG/ACT IN AERS: 2.0000 | INHALATION_SPRAY | RESPIRATORY_TRACT | 1 refills | Status: DC | PRN

## 2020-11-16 NOTE — Patient Instructions (Addendum)
Asthma Begin Breztri 2 puffs twice a day with spacer (sample given). Make sure and use this medication every day. This will replace Advair 45.  If you are doing well at your follow up visit, we will adjust your inhaler If no improvement in your symptoms, begin prednisone 10 mg tablets. Take 2 tablets once a day for 4 days, then take 1 tablet on the 5th day, then stop. Continue to monitor your finger stick blood sugar while taking prednisone Continue montelukast 10 mg at night to help prevent cough and wheeze. Patient cautioned that rarely some children/adults can experience behavioral changes after beginning montelukast. These side effects are rare, however, if you notice any change, notify the clinic and discontinue montelukast. May use albuterol 2 puffs every 4 hours as needed for cough, wheeze, tightness in chest, or shortness of breath. You may use albuterol 2 puffs 5-15 minutes before activity  Allergic rhinitis Stop Claritin for the next week to help thin out mucus For thick postnasal drip may use Mucinex 600 to 1200 mg twice a day as needed.  Be sure to drink plenty of fluids Continue XHANCE 2 sprays each nostril twice a day as needed for nasal congestion Continue azelastine nasal spray using 1 to 2 sprays each nostril 1-2 times a day as needed for runny nose/drainage May use saline nasal spray or saline nasal rinse as needed.  Use this prior to any medicated nasal sprays Continue allergen avoidance measures directed toward pollens, dust mite, mold, cat, dog, and cockroach. Continue allergen immunotherapy once a week and have access to an epinephrine autoinjector set. Make sure you are back to baseline before getting your next injection  Reflux Begin omeprazole to 20 mg twice a day. Take this medication about 30 minutes before meals. This will take the place of Esomeprazole  Continue dietary and lifestyle modifications as listed below  Allergic conjunctivitis Some over the counter eye  drops include Pataday one drop in each eye once a day as needed for red, itchy eyes OR Zaditor one drop in each eye twice a day as needed for red itchy eyes.  Call the clinic if this treatment plan is not working well for you  Follow up in 1 month or sooner if needed.

## 2020-11-16 NOTE — Progress Notes (Addendum)
[address] [place]  [address] Dept: [phone]  FOLLOW UP NOTE  Patient ID: Mackenzie Weber, female    DOB: [birth year]  Age: 60 y.o. MRN: [id number] Date of Office Visit: 11/16/2020  Assessment  Chief Complaint: Wheezing (/)  HPI Mackenzie Weber is a 60 year old female who presents to the clinic for evaluation of wheeze.  She was last seen in this clinic on 10/18/2020 by Althea Charon, FNP, for evaluation of asthma, allergic rhinitis, and allergic conjunctivitis.  At that time she restarted on montelukast.  At today's visit, she reports that she continues to experience symptoms including shortness of breath at rest and with activity, wheezing occurring during the day and worsening overnight, and cough which is sometimes productive and sometimes dry.  She reports the symptoms have worsened over the last week.  She denies fever, sweats, chills, and sick contacts.  She continues Advair 45 2 puffs twice a day, montelukast 10 mg once a day, and has increased her albuterol use to 1-2 times a day over the last 2 weeks with moderate relief of symptoms.  She did start taking the montelukast about 1 week ago and has not started using Symbicort at this time.  Allergic rhinitis is reported as moderately well controlled with symptoms including nasal congestion and postnasal drainage.  She continues XHANCE, azelastine, saline nasal rinses, Mucinex, and Claritin at this time.  Allergic conjunctivitis is reported as moderately well controlled with occasional use of Pataday.  She continues allergen immunotherapy with no large local reactions.  Reflux is reported as moderately well controlled with occasional heartburn for which she takes Nexium as needed with relief of symptoms.  Drug Allergies:  Allergies  Allergen Reactions  . Cortisone     Red area around injection site x 29mth  . Depo-Medrol [Methylprednisolone Sodium Succ] Nausea Only and Other (See Comments)    Dizziness  . Aspirin Anxiety and  Other (See Comments)    Rapid Heart beat    Physical Exam: BP 122/70   Pulse 68   Temp 97.7 F (36.5 C) (Tympanic)   Resp 20   SpO2 99%    Physical Exam Vitals reviewed.  Constitutional:      Appearance: Normal appearance.  HENT:     Head: Normocephalic and atraumatic.     Right Ear: Tympanic membrane normal.     Left Ear: Tympanic membrane normal.     Nose:     Comments: Bilateral nares edematous and pale with clear nasal drainage noted.  Pharynx normal.  Ears normal.  Eyes normal.    Mouth/Throat:     Pharynx: Oropharynx is clear.  Eyes:     Conjunctiva/sclera: Conjunctivae normal.  Cardiovascular:     Rate and Rhythm: Normal rate and regular rhythm.     Heart sounds: Normal heart sounds. No murmur heard.   Pulmonary:     Effort: Pulmonary effort is normal.     Comments: Bilateral wheeze throughout lung fields with moderate improvement postbronchodilator therapy.  No rhonci Musculoskeletal:        General: Normal range of motion.     Cervical back: Normal range of motion and neck supple.  Skin:    General: Skin is warm and dry.  Neurological:     Mental Status: She is alert and oriented to person, place, and time.  Psychiatric:        Mood and Affect: Mood normal.        Behavior: Behavior normal.  Thought Content: Thought content normal.        Judgment: Judgment normal.     Diagnostics: FVC 1.52, FEV1 1.07.  Predicted FVC 2.26, predicted FEV1 1.78.  Spirometry indicates mild restriction.  Postbronchodilator therapy FVC 1.48, FEV1 1.12.  Postbronchodilator spirometry indicates no significant bronchodilator response.  Assessment and Plan: 1. Moderate persistent asthma with acute exacerbation   2. Perennial and seasonal allergic rhinitis   3. Allergic conjunctivitis of both eyes   4. Gastroesophageal reflux disease, unspecified whether esophagitis present     Meds ordered this encounter  Medications  . PROAIR HFA 108 (90 Base) MCG/ACT inhaler     Sig: Inhale 2 puffs into the lungs every 4 (four) hours as needed for wheezing or shortness of breath.    Dispense:  18 g    Refill:  1  . omeprazole (PRILOSEC) 20 MG capsule    Sig: Take 1 capsule (20 mg total) by mouth 2 (two) times daily before a meal.    Dispense:  60 capsule    Refill:  5    Patient Instructions  Asthma Begin Breztri 2 puffs twice a day with spacer (sample given). Make sure and use this medication every day. This will replace Advair 45.  If you are doing well at your follow up visit, we will adjust your inhaler If no improvement in your symptoms, begin prednisone 10 mg tablets. Take 2 tablets once a day for 4 days, then take 1 tablet on the 5th day, then stop. Continue to monitor your finger stick blood sugar while taking prednisone Continue montelukast 10 mg at night to help prevent cough and wheeze. Patient cautioned that rarely some children/adults can experience behavioral changes after beginning montelukast. These side effects are rare, however, if you notice any change, notify the clinic and discontinue montelukast. May use albuterol 2 puffs every 4 hours as needed for cough, wheeze, tightness in chest, or shortness of breath. You may use albuterol 2 puffs 5-15 minutes before activity  Allergic rhinitis Stop Claritin for the next week to help thin out mucus For thick postnasal drip may use Mucinex 600 to 1200 mg twice a day as needed.  Be sure to drink plenty of fluids Continue XHANCE 2 sprays each nostril twice a day as needed for nasal congestion Continue azelastine nasal spray using 1 to 2 sprays each nostril 1-2 times a day as needed for runny nose/drainage May use saline nasal spray or saline nasal rinse as needed.  Use this prior to any medicated nasal sprays Continue allergen avoidance measures directed toward pollens, dust mite, mold, cat, dog, and cockroach. Continue allergen immunotherapy once a week and have access to an epinephrine autoinjector set.  Make sure you are back to baseline before getting your next injection  Reflux Begin omeprazole to 20 mg twice a day. Take this medication about 30 minutes before meals. This will take the place of Esomeprazole  Continue dietary and lifestyle modifications as listed below  Allergic conjunctivitis Some over the counter eye drops include Pataday one drop in each eye once a day as needed for red, itchy eyes OR Zaditor one drop in each eye twice a day as needed for red itchy eyes.  Call the clinic if this treatment plan is not working well for you  Follow up in 1 month or sooner if needed.    Return in about 4 weeks (around 12/14/2020), or if symptoms worsen or fail to improve.    Thank you for the opportunity  to care for this patient.  Please do not hesitate to contact me with questions.  Gareth Morgan, FNP [facility] [place]  ________________________________________________  I have provided oversight concerning [facility]'s evaluation and treatment of this patient's health issues addressed during today's encounter.  I agree with the assessment and therapeutic plan as outlined in the note.   Signed,   R Edgar Frisk, MD

## 2020-11-22 ENCOUNTER — Other Ambulatory Visit: Payer: Self-pay | Admitting: Family Medicine

## 2020-11-25 ENCOUNTER — Ambulatory Visit (INDEPENDENT_AMBULATORY_CARE_PROVIDER_SITE_OTHER): Payer: BC Managed Care – PPO

## 2020-11-25 DIAGNOSIS — J309 Allergic rhinitis, unspecified: Secondary | ICD-10-CM | POA: Diagnosis not present

## 2020-11-30 ENCOUNTER — Ambulatory Visit (INDEPENDENT_AMBULATORY_CARE_PROVIDER_SITE_OTHER): Payer: BC Managed Care – PPO

## 2020-11-30 DIAGNOSIS — J309 Allergic rhinitis, unspecified: Secondary | ICD-10-CM | POA: Diagnosis not present

## 2020-12-07 ENCOUNTER — Ambulatory Visit (INDEPENDENT_AMBULATORY_CARE_PROVIDER_SITE_OTHER): Payer: BC Managed Care – PPO

## 2020-12-07 DIAGNOSIS — J309 Allergic rhinitis, unspecified: Secondary | ICD-10-CM

## 2020-12-13 ENCOUNTER — Other Ambulatory Visit: Payer: Self-pay

## 2020-12-13 ENCOUNTER — Ambulatory Visit (INDEPENDENT_AMBULATORY_CARE_PROVIDER_SITE_OTHER): Payer: BC Managed Care – PPO | Admitting: Physician Assistant

## 2020-12-13 ENCOUNTER — Encounter: Payer: Self-pay | Admitting: Physician Assistant

## 2020-12-13 VITALS — BP 128/62 | HR 68 | Ht 60.0 in | Wt 225.0 lb

## 2020-12-13 DIAGNOSIS — I10 Essential (primary) hypertension: Secondary | ICD-10-CM | POA: Diagnosis not present

## 2020-12-13 DIAGNOSIS — E876 Hypokalemia: Secondary | ICD-10-CM | POA: Diagnosis not present

## 2020-12-13 DIAGNOSIS — R600 Localized edema: Secondary | ICD-10-CM

## 2020-12-13 DIAGNOSIS — E119 Type 2 diabetes mellitus without complications: Secondary | ICD-10-CM

## 2020-12-13 DIAGNOSIS — J4540 Moderate persistent asthma, uncomplicated: Secondary | ICD-10-CM

## 2020-12-13 LAB — BASIC METABOLIC PANEL
BUN/Creatinine Ratio: 15 (ref 9–23)
BUN: 17 mg/dL (ref 6–24)
CO2: 22 mmol/L (ref 20–29)
Calcium: 9.6 mg/dL (ref 8.7–10.2)
Chloride: 103 mmol/L (ref 96–106)
Creatinine, Ser: 1.1 mg/dL — ABNORMAL HIGH (ref 0.57–1.00)
Glucose: 124 mg/dL — ABNORMAL HIGH (ref 65–99)
Potassium: 3.4 mmol/L — ABNORMAL LOW (ref 3.5–5.2)
Sodium: 140 mmol/L (ref 134–144)
eGFR: 58 mL/min/{1.73_m2} — ABNORMAL LOW (ref >59)

## 2020-12-13 NOTE — Progress Notes (Signed)
Cardiology Office Note:    Date:  12/13/2020   ID:  Mackenzie Weber, DOB 1961-06-03, MRN [id number]  PCP:  Flossie Buffy, NP   [place]  Cardiologist:  Quay Burow, MD Advanced Practice Provider:  Almyra Deforest PA-C Electrophysiologist:  None  [phone]  Referring MD: Flossie Buffy, NP   Chief Complaint  Patient presents with  . Follow-up    Seen for Dr. Gwenlyn Found    History of Present Illness:    Mackenzie Weber is a 60 y.o. female with a hx of lower extremity edema, DM 2, hypertension and IBS.  She has a family history of heart issue with her mother having bypass surgery and father had a MI.  She herself have never been diagnosed with coronary artery disease.  GXT obtained in Jan 2020 was negative. Lower extremity venous study in March 2021 was negative for DVT.  Echocardiogram obtained in 03/2020 showed no normal EF, grade 1 DD.  She was started on losartan and Maxide, lower extremity edema was significantly improved.  She was last seen by Dr. Gwenlyn Found in August 2021 at which time she was doing well.  Patient presents today for 28-monthfollow-up.  She denies any chest pain, shortness of breath, or significant lower extremity edema.  We discussed importance of legs elevation and the sole restriction.  Maxide seems to be controlling her edema quite well.  Blood pressure is very well controlled.  She has been nauseated for the past week.  Looking back, her potassium you back in October 2021 was low at 3.3.  I recommend a repeat basic metabolic panel today.  If potassium is within normal limit, she can follow-up with Dr. BGwenlyn Foundin 6 months.   Past Medical History:  Diagnosis Date  . Anemia   . Asthma   . Diabetes mellitus without complication ([place]   . Foot pain, right   . Hypertension   . IBS (irritable bowel syndrome)   . Sciatica of left side   . Seasonal allergies     Past Surgical History:  Procedure Laterality Date  . ABDOMINAL HYSTERECTOMY     . APPENDECTOMY    . BLADDER SUSPENSION  2012   TVT  . BREAST SURGERY  2001   /biopsy-benign  . CHOLECYSTECTOMY    . EXCISIONAL HEMORRHOIDECTOMY    . KNEE SURGERY     right  . SHOULDER SURGERY Left 02/13/2018   Lipoma removed   . Small Bowel Surgery    . TONSILLECTOMY    . TOTAL ABDOMINAL HYSTERECTOMY W/ BILATERAL SALPINGOOPHORECTOMY  1989   LSO-1987; [id number] . WRIST SURGERY     carpal tunnel repair    Current Medications: Current Meds  Medication Sig  . azelastine (ASTELIN) 0.1 % nasal spray 1-2 sprays  each nostril twice daily as needed  . blood glucose meter kit and supplies KIT Check blood sugar once daily. One Touch Verio Reflect Meter  . budesonide-formoterol (SYMBICORT) 80-4.5 MCG/ACT inhaler Inhale 2 puffs into the lungs 2 (two) times daily. Pt. Will start after she finishes her advair 45-21.  .Marland KitchenEPINEPHrine (AUVI-Q) 0.3 mg/0.3 mL IJ SOAJ injection Inject 0.3 mg into the muscle as needed for anaphylaxis.  .Marland Kitchenesomeprazole (NEXIUM) 20 MG capsule Take 20 mg by mouth daily at 12 noon. 1 capsule 30 minutes before Breakfast Daily  . estradiol (ESTRACE) 0.1 MG/GM vaginal cream Place vaginally.  . Fluticasone Propionate (XHANCE) 93 MCG/ACT EXHU Place 2 sprays into the nose 2 (two)  times daily as needed.  . fluticasone-salmeterol (ADVAIR HFA) 45-21 MCG/ACT inhaler Inhale 2 puffs into the lungs 2 (two) times daily.  . Lancets (ONETOUCH DELICA PLUS [id number]) MISC USE TO TEST BLOOD SUGAR ONCE DAILY  . losartan (COZAAR) 100 MG tablet Take 1 tablet (100 mg total) by mouth daily.  . metFORMIN (GLUCOPHAGE-XR) 500 MG 24 hr tablet Take 1 tablet (500 mg total) by mouth daily with breakfast.  . montelukast (SINGULAIR) 10 MG tablet Take 1 tablet (10 mg total) by mouth at bedtime. FOR SEASONAL ALLERGY  . Multiple Vitamin (MULTIVITAMIN) capsule Take 1 capsule by mouth daily.  . NON FORMULARY 2 allergy injections every week  . Olopatadine HCl (PATADAY) 0.2 % SOLN Place 1 drop into both eyes  daily as needed.  Marland Kitchen omeprazole (PRILOSEC) 20 MG capsule Take 1 capsule (20 mg total) by mouth 2 (two) times daily before a meal.  . ONETOUCH VERIO test strip USE TO TEST BLOOD SUGAR ONCE DAILY  . PROAIR HFA 108 (90 Base) MCG/ACT inhaler INHALE 2 PUFFS BY MOUTH EVERY 4 HOURS AS NEEDED FOR WHEEZING FOR SHORTNESS OF BREATH  . triamterene-hydrochlorothiazide (MAXZIDE) 75-50 MG tablet Take 0.5 tablets by mouth daily.     Allergies:   Cortisone, Depo-medrol [methylprednisolone sodium succ], and Aspirin   Social History   Socioeconomic History  . Marital status: Married    Spouse name: Not on file  . Number of children: Not on file  . Years of education: Not on file  . Highest education level: Not on file  Occupational History  . Occupation: retired in 2015 from [employer].  Tobacco Use  . Smoking status: Never Smoker  . Smokeless tobacco: Never Used  Vaping Use  . Vaping Use: Never used  Substance and Sexual Activity  . Alcohol use: No  . Drug use: No  . Sexual activity: Yes    Partners: Male    Birth control/protection: Surgical  Other Topics Concern  . Not on file  Social History Narrative  . Not on file   Social Determinants of Health   Financial Resource Strain: Not on file  Food Insecurity: Not on file  Transportation Needs: Not on file  Physical Activity: Not on file  Stress: Not on file  Social Connections: Not on file     Family History: The patient's family history includes Allergic rhinitis in her mother; Asthma in her mother; Cancer (age of onset: 56) in her sister; Diabetes in her brother, father, mother, and sister; Heart disease in her father and mother; Hypertension in her father, mother, paternal uncle, and sister.  ROS:   Please see the history of present illness.     All other systems reviewed and are negative.  EKGs/Labs/Other Studies Reviewed:    The following studies were reviewed today:  Echo 03/15/2020 1. Left ventricular ejection  fraction, by estimation, is 70 to 75%. The  left ventricle has hyperdynamic function. The left ventricle has no  regional wall motion abnormalities. Left ventricular diastolic parameters  are consistent with Grade I diastolic  dysfunction (impaired relaxation). The average left ventricular global  longitudinal strain is -25.8 %. The global longitudinal strain is normal.  2. Right ventricular systolic function is normal. The right ventricular  size is normal.  3. Left atrial size was mildly dilated.  4. The mitral valve is normal in structure. Mild mitral valve  regurgitation. No evidence of mitral stenosis.  5. The aortic valve is normal in structure. Aortic valve  regurgitation is  not visualized. No aortic stenosis is present.  6. The inferior vena cava is normal in size with greater than 50%  respiratory variability, suggesting right atrial pressure of 3 mmHg.   EKG:  EKG is ordered today.  The ekg ordered today demonstrates normal sinus rhythm with no significant ST-T wave changes  Recent Labs: 02/18/2020: Pro B Natriuretic peptide (BNP) 20.0 08/11/2020: ALT 10; BUN 13; Creatinine, Ser 0.97; Hemoglobin 11.0; Platelets 290.0; Potassium 3.3; Sodium 138  Recent Lipid Panel    Component Value Date/Time   CHOL 168 08/11/2020 0830   TRIG 64.0 08/11/2020 0830   HDL 56.50 08/11/2020 0830   CHOLHDL 3 08/11/2020 0830   VLDL 12.8 08/11/2020 0830   LDLCALC 98 08/11/2020 0830   LDLCALC 106 (H) 08/12/2018 1425     Risk Assessment/Calculations:       Physical Exam:    VS:  BP 128/62   Pulse 68   Ht 5' (1.524 m)   Wt 225 lb (102.1 kg)   SpO2 98%   BMI 43.94 kg/m     Wt Readings from Last 3 Encounters:  12/13/20 225 lb (102.1 kg)  10/16/20 226 lb (102.5 kg)  10/15/20 226 lb (102.5 kg)     GEN:  Well nourished, well developed in no acute distress HEENT: Normal NECK: No JVD; No carotid bruits LYMPHATICS: No lymphadenopathy CARDIAC: RRR, no murmurs, rubs,  gallops RESPIRATORY:  Clear to auscultation without rales, wheezing or rhonchi  ABDOMEN: Soft, non-tender, non-distended MUSCULOSKELETAL:  No edema; No deformity  SKIN: Warm and dry NEUROLOGIC:  Alert and oriented x 3 PSYCHIATRIC:  Normal affect   ASSESSMENT:    1. Leg edema   2. Hypokalemia   3. Essential hypertension   4. Controlled type 2 diabetes mellitus without complication, without long-term current use of insulin (HCC)    PLAN:    In order of problems listed above:  1. Leg edema: Continue on the current dose of Maxide.  Use conservative management such as leg elevation and salt restriction.  Suspect leg edema is more related to venous stasis rather than diastolic heart failure.  2. Hypokalemia: Repeat basic metabolic panel.  Last potassium 3.3 in October 2021  3. Hypertension: Blood pressure well controlled on current therapy  4. DM2: Managed by primary care provider  5. Asthma: Followed by allergy specialist.  Doing well after starting on new inhaler last month.   Medication Adjustments/Labs and Tests Ordered: Current medicines are reviewed at length with the patient today.  Concerns regarding medicines are outlined above.  Orders Placed This Encounter  Procedures  . Basic metabolic panel  . EKG 12-Lead   No orders of the defined types were placed in this encounter.   Patient Instructions  Medication Instructions:  Your physician recommends that you continue on your current medications as directed. Please refer to the Current Medication list given to you today.  *If you need a refill on your cardiac medications before your next appointment, please call your pharmacy*  Lab Work: Your physician recommends that you return for lab work TODAY:   BMET  If you have labs (blood work) drawn today and your tests are completely normal, you will receive your results only by: Marland Kitchen MyChart Message (if you have MyChart) OR . A paper copy in the mail If you have any lab  test that is abnormal or we need to change your treatment, we will call you to review the results.  Testing/Procedures: NONE ordered at this  time of appointment   Follow-Up: At [facility], you and your health needs are our priority.  As part of our continuing mission to provide you with exceptional heart care, we have created designated Provider Care Teams.  These Care Teams include your primary Cardiologist (physician) and Advanced Practice Providers (APPs -  Physician Assistants and Nurse Practitioners) who all work together to provide you with the care you need, when you need it.  Your next appointment:   6 month(s)  The format for your next appointment:   In Person  Provider:   Quay Burow, MD  Other Instructions     Signed, Almyra Deforest, [place]  12/13/2020 10:34 AM    [place]

## 2020-12-13 NOTE — Patient Instructions (Addendum)
Medication Instructions:  Your physician recommends that you continue on your current medications as directed. Please refer to the Current Medication list given to you today.  *If you need a refill on your cardiac medications before your next appointment, please call your pharmacy*  Lab Work: Your physician recommends that you return for lab work TODAY:   BMET  If you have labs (blood work) drawn today and your tests are completely normal, you will receive your results only by: Marland Kitchen MyChart Message (if you have MyChart) OR . A paper copy in the mail If you have any lab test that is abnormal or we need to change your treatment, we will call you to review the results.  Testing/Procedures: NONE ordered at this time of appointment   Follow-Up: At [facility], you and your health needs are our priority.  As part of our continuing mission to provide you with exceptional heart care, we have created designated Provider Care Teams.  These Care Teams include your primary Cardiologist (physician) and Advanced Practice Providers (APPs -  Physician Assistants and Nurse Practitioners) who all work together to provide you with the care you need, when you need it.  Your next appointment:   6 month(s)  The format for your next appointment:   In Person  Provider:   Quay Burow, MD  Other Instructions

## 2020-12-14 LAB — HM MAMMOGRAPHY

## 2020-12-15 ENCOUNTER — Ambulatory Visit (INDEPENDENT_AMBULATORY_CARE_PROVIDER_SITE_OTHER): Payer: BC Managed Care – PPO

## 2020-12-15 DIAGNOSIS — J309 Allergic rhinitis, unspecified: Secondary | ICD-10-CM | POA: Diagnosis not present

## 2020-12-21 ENCOUNTER — Ambulatory Visit (INDEPENDENT_AMBULATORY_CARE_PROVIDER_SITE_OTHER): Payer: BC Managed Care – PPO | Admitting: *Deleted

## 2020-12-21 DIAGNOSIS — J309 Allergic rhinitis, unspecified: Secondary | ICD-10-CM | POA: Diagnosis not present

## 2020-12-29 ENCOUNTER — Ambulatory Visit (INDEPENDENT_AMBULATORY_CARE_PROVIDER_SITE_OTHER): Payer: BC Managed Care – PPO

## 2020-12-29 DIAGNOSIS — J309 Allergic rhinitis, unspecified: Secondary | ICD-10-CM

## 2021-01-03 ENCOUNTER — Ambulatory Visit (INDEPENDENT_AMBULATORY_CARE_PROVIDER_SITE_OTHER): Payer: BC Managed Care – PPO

## 2021-01-03 DIAGNOSIS — J309 Allergic rhinitis, unspecified: Secondary | ICD-10-CM | POA: Diagnosis not present

## 2021-01-10 ENCOUNTER — Telehealth: Payer: Self-pay

## 2021-01-10 DIAGNOSIS — E1165 Type 2 diabetes mellitus with hyperglycemia: Secondary | ICD-10-CM

## 2021-01-10 MED ORDER — ONETOUCH VERIO VI STRP: ORAL_STRIP | 3 refills | Status: DC

## 2021-01-10 MED ORDER — ONETOUCH DELICA PLUS LANCET33G MISC: 3 refills | Status: DC

## 2021-01-10 NOTE — Telephone Encounter (Signed)
Requesting refills for:  Lancets (ONETOUCH DELICA PLUS [id number]  ONETOUCH VERIO test strip [[id number]]    She would like them sent to a different pharmacy. Send to [facility] on [address], [place]   Thank you

## 2021-01-12 ENCOUNTER — Ambulatory Visit (INDEPENDENT_AMBULATORY_CARE_PROVIDER_SITE_OTHER): Payer: BC Managed Care – PPO

## 2021-01-12 DIAGNOSIS — J309 Allergic rhinitis, unspecified: Secondary | ICD-10-CM

## 2021-01-16 ENCOUNTER — Telehealth: Payer: Self-pay | Admitting: Nurse Practitioner

## 2021-01-16 DIAGNOSIS — K219 Gastro-esophageal reflux disease without esophagitis: Secondary | ICD-10-CM

## 2021-01-16 NOTE — Telephone Encounter (Signed)
Pt called and said that she has been having pain in her stomach and said she sees a gastroenterologist, but she said she would like to be referred to Dr Lyndel Safe with [facility]. Call back for question for pt [phone]

## 2021-01-17 NOTE — Telephone Encounter (Signed)
Ok to enter referral. She also needs to sign medical release form to get her records to new provider

## 2021-01-17 NOTE — Telephone Encounter (Signed)
What will be the reason for another GI consult? Is she seeking a second opinion or transferring her care?

## 2021-01-17 NOTE — Telephone Encounter (Signed)
Transferring care

## 2021-01-18 NOTE — Patient Instructions (Incomplete)
Asthma Continue Breztri 2 puffs twice a day with spacer  Continue montelukast 10 mg at night to help prevent cough and wheeze. May use albuterol 2 puffs every 4 hours as needed for cough, wheeze, tightness in chest, or shortness of breath. You may use albuterol 2 puffs 5-15 minutes before activity  Allergic rhinitis Continue Claritin for the next week to help thin out mucus For thick postnasal drip may use Mucinex 600 to 1200 mg twice a day as needed.  Be sure to drink plenty of fluids Continue XHANCE 2 sprays each nostril twice a day as needed for nasal congestion Continue azelastine nasal spray using 1 to 2 sprays each nostril 1-2 times a day as needed for runny nose/drainage May use saline nasal spray or saline nasal rinse as needed.  Use this prior to any medicated nasal sprays Continue allergen avoidance measures directed toward pollens, dust mite, mold, cat, dog, and cockroach. Continue allergen immunotherapy once a week per protocol and have access to an epinephrine autoinjector set.  Reflux Continue omeprazole to 20 mg twice a day. Take this medication about 30 minutes before meals.  Continue dietary and lifestyle modifications as listed below  Allergic conjunctivitis Some over the counter eye drops include Pataday one drop in each eye once a day as needed for red, itchy eyes OR Zaditor one drop in each eye twice a day as needed for red itchy eyes.  Please let us kow if this treatment plan is not working well for you  Schedule a ollow up appointment in  month or sooner if needed.

## 2021-01-19 ENCOUNTER — Encounter: Payer: Self-pay | Admitting: Allergy & Immunology

## 2021-01-19 ENCOUNTER — Other Ambulatory Visit: Payer: Self-pay

## 2021-01-19 ENCOUNTER — Ambulatory Visit (INDEPENDENT_AMBULATORY_CARE_PROVIDER_SITE_OTHER): Payer: BC Managed Care – PPO | Admitting: Allergy & Immunology

## 2021-01-19 ENCOUNTER — Ambulatory Visit: Payer: BC Managed Care – PPO | Admitting: Family

## 2021-01-19 ENCOUNTER — Ambulatory Visit: Payer: Self-pay

## 2021-01-19 VITALS — BP 138/72 | HR 68 | Temp 97.1°F | Resp 18

## 2021-01-19 DIAGNOSIS — K219 Gastro-esophageal reflux disease without esophagitis: Secondary | ICD-10-CM

## 2021-01-19 DIAGNOSIS — J309 Allergic rhinitis, unspecified: Secondary | ICD-10-CM

## 2021-01-19 DIAGNOSIS — J3089 Other allergic rhinitis: Secondary | ICD-10-CM

## 2021-01-19 DIAGNOSIS — J4541 Moderate persistent asthma with (acute) exacerbation: Secondary | ICD-10-CM | POA: Diagnosis not present

## 2021-01-19 DIAGNOSIS — J4540 Moderate persistent asthma, uncomplicated: Secondary | ICD-10-CM

## 2021-01-19 NOTE — Progress Notes (Signed)
FOLLOW UP  Date of Service/Encounter:  01/19/21   Assessment:   Moderate persistent asthma with acute exacerbation  - recommended starting the prednisone she had on hand   Perennial and seasonal allergic rhinitis  GERD - on omeprazole  Plan/Recommendations:   1. Moderate persistent asthma without complication - Lung testing looks fairly stable, but you were wheezing throughout.  - Start the prednisone pack that you have at home. - Try to use medications more regularly so we can see what is working and what is not working.  - Try to use the [employer] two puffs at LEAST once daily EVERY DAY. - Spacer use reviewed. - Daily controller medication(s): Breztri two puffs once daily with spacer. - Prior to physical activity: albuterol 2 puffs 10-15 minutes before physical activity. - Rescue medications: albuterol 4 puffs every 4-6 hours as needed - Changes during respiratory infections or worsening symptoms: Increase Breztri two puffs to 2 puffs twice daily for TWO WEEKS. - Asthma control goals:  * Full participation in all desired activities (may need albuterol before activity) * Albuterol use two time or less a week on average (not counting use with activity) * Cough interfering with sleep two time or less a month * Oral steroids no more than once a year * No hospitalizations  2. Perennial and seasonal allergic rhinitis - Try using your azelastine one spray per nostril at least once daily, especially at night, to help with the drainage.  - Try Allegra (fexofenadine) one tablet daily to help with nasal drip and the ensuing cough.  - Continue with allergy shots at the same schedule.   3. Gastroesophageal reflux disease - Continue with omeprazole at least once daily.  4. Return in about 3 months (around 04/20/2021).    Subjective:   Mackenzie Weber is a 60 y.o. female presenting today for follow up of  Chief Complaint  Patient presents with  . Asthma    Mackenzie Weber has  a history of the following: Patient Active Problem List   Diagnosis Date Noted  . Allergic conjunctivitis of both eyes 11/16/2020  . Allergic conjunctivitis 05/30/2020  . Moderate persistent asthma 05/30/2020  . Grade I diastolic dysfunction 50/27/7412  . Bilateral foot pain 01/13/2020  . Extensor tendonitis of foot 01/13/2020  . BMI 40.0-44.9, adult ([place]) 12/23/2019  . Type 2 diabetes mellitus with hyperglycemia, without long-term current use of insulin ([place]) 01/01/2019  . Perennial and seasonal allergic rhinitis 05/20/2018  . Chronic bilateral low back pain with left-sided sciatica 05/20/2018  . Lipoma of left upper extremity 01/29/2018  . Muscle spasm of left lower extremity 01/29/2018  . Intestinal ulcer 04/23/2017  . Bilateral lower extremity edema 04/23/2017  . Gastroesophageal reflux disease 03/28/2016  . History of colon polyps 03/28/2016  . Anemia 03/28/2016  . Somatic dysfunction of cervical region 11/16/2015  . Chronic sciatica 01/28/2015  . Somatic dysfunction of pelvic region 01/28/2015  . Chronic pelvic pain in female 01/28/2015  . Somatic dysfunction of lower extremity 01/28/2015  . Somatic dysfunction of sacral region 01/28/2015  . Hypertensive disorder 10/22/2014  . Morbid obesity due to excess calories ([place]) 10/22/2014  . Abdominal muscle strain 06/07/2014  . Sciatica of left side 11/03/2012    History obtained from: chart review and patient.  Mackenzie Weber is a 60 y.o. female presenting for a follow up visit.  She was last seen in February 2022.  At that time, she was started on Br 2 puffs twice daily as well as  a course of prednisone.  She was continued on montelukast and albuterol as needed.  For her allergic rhinitis, eztri her Claritin was stopped temporarily.  She was continued on XHANCE and Astelin.  Mucinex was added.  She was on allergen immunotherapy weekly.  She was also started on omeprazole 20 mg twice daily.  Since last visit, she has done well. She did not  end up using it.   Asthma/Respiratory Symptom History: She remains on the Breztri two puffs twice daily. She is still having some wheezing. She does feel like this is working better than her Advair. She does use her Breztri at least once daily, sometimes twice daily. She feels that she was told early on when she needs it. She will try to do better. She does have a lot drainage at night, which causes her to wake up.  Allergic Rhinitis Symptom History: She has been using XHANCE on a regular basis.  She has the Astelin but she does not appreciate the taste.  She never restarted her Claritin.  She has been on Zyrtec but it did not provide much relief.  She has not tried Allegra yet and is open to doing so since it causes the least amount of sleepiness.  GERD Symptom History: She is omeprazole. She is not taking it routinely. She is going to make a pill case.   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  She does report having issues when she goes into certain homes that have did have pets.  She has her grandsons cat living with her right now.  Otherwise, there have been no changes to her past medical history, surgical history, family history, or social history.    Review of Systems  Constitutional: Negative.  Negative for fever, malaise/fatigue and weight loss.  HENT: Positive for congestion. Negative for ear discharge and ear pain.   Eyes: Negative for pain, discharge and redness.  Respiratory: Positive for cough and wheezing. Negative for sputum production and shortness of breath.   Cardiovascular: Negative.  Negative for chest pain and palpitations.  Gastrointestinal: Negative for abdominal pain and heartburn.  Skin: Negative.  Negative for itching and rash.  Neurological: Negative for dizziness and headaches.  Endo/Heme/Allergies: Negative for environmental allergies. Does not bruise/bleed easily.       Objective:   Blood pressure 138/72, pulse 68, temperature (!)  97.1 F (36.2 C), temperature source Tympanic, resp. rate 18, SpO2 97 %. There is no height or weight on file to calculate BMI.   Physical Exam:  Physical Exam Constitutional:      Appearance: She is well-developed.  HENT:     Head: Normocephalic and atraumatic.     Right Ear: Tympanic membrane, ear canal and external ear normal.     Left Ear: Tympanic membrane, ear canal and external ear normal.     Nose: No nasal deformity, septal deviation, mucosal edema or rhinorrhea.     Right Turbinates: Enlarged, swollen and pale.     Left Turbinates: Enlarged, swollen and pale.     Right Sinus: No maxillary sinus tenderness or frontal sinus tenderness.     Left Sinus: No maxillary sinus tenderness or frontal sinus tenderness.     Comments: Marked rhinorrhea.    Mouth/Throat:     Mouth: Mucous membranes are not pale and not dry.     Pharynx: Uvula midline.  Eyes:     General:        Right eye: No discharge.  Left eye: No discharge.     Conjunctiva/sclera: Conjunctivae normal.     Right eye: Right conjunctiva is not injected. No chemosis.    Left eye: Left conjunctiva is not injected. No chemosis.    Pupils: Pupils are equal, round, and reactive to light.  Cardiovascular:     Rate and Rhythm: Normal rate and regular rhythm.     Heart sounds: Normal heart sounds.  Pulmonary:     Effort: Pulmonary effort is normal. No tachypnea, accessory muscle usage or respiratory distress.     Breath sounds: Wheezing present. No rhonchi or rales.     Comments: Expiratory wheezes heard throughout the lung fields, especially on the back. Chest:     Chest wall: No tenderness.  Lymphadenopathy:     Cervical: No cervical adenopathy.  Skin:    Coloration: Skin is not pale.     Findings: No abrasion, erythema, petechiae or rash. Rash is not papular, urticarial or vesicular.  Neurological:     Mental Status: She is alert.      Diagnostic studies:    Spirometry: results abnormal (FEV1:  0.92/52%, FVC: 1.29/57%, FEV1/FVC: 71%).    Spirometry consistent with possible restrictive disease.    Allergy Studies: none       Salvatore Marvel, MD  [facility]

## 2021-01-19 NOTE — Patient Instructions (Addendum)
1. Moderate persistent asthma without complication - Lung testing looks fairly good. - Try to use medications more regularly so we can see what is working and what is not working.  - Try to use the [employer] two puffs at LEAST once daily EVERY DAY. - Spacer use reviewed. - Daily controller medication(s): Breztri two puffs once daily with spacer. - Prior to physical activity: albuterol 2 puffs 10-15 minutes before physical activity. - Rescue medications: albuterol 4 puffs every 4-6 hours as needed - Changes during respiratory infections or worsening symptoms: Increase Breztri two puffs to 2 puffs twice daily for TWO WEEKS. - Asthma control goals:  * Full participation in all desired activities (may need albuterol before activity) * Albuterol use two time or less a week on average (not counting use with activity) * Cough interfering with sleep two time or less a month * Oral steroids no more than once a year * No hospitalizations  2. Perennial and seasonal allergic rhinitis - Try using your azelastine one spray per nostril at least once daily, especially at night, to help with the drainage.  - Try Allegra (fexofenadine) one tablet daily to help with nasal drip and the ensuing cough.  - Continue with allergy shots at the same schedule.   3. Gastroesophageal reflux disease - Continue with omeprazole at least once daily.  4. Return in about 3 months (around 04/20/2021).    Please inform us of any Emergency Department visits, hospitalizations, or changes in symptoms. Call us before going to the ED for breathing or allergy symptoms since we might be able to fit you in for a sick visit. Feel free to contact us anytime with any questions, problems, or concerns.  It was a pleasure to meet you today!  Websites that have reliable patient information: 1. American Academy of Asthma, Allergy, and Immunology: www.aaaai.org 2. Food Allergy Research and Education (FARE): foodallergy.org 3. Mothers of  Asthmatics: http://www.asthmacommunitynetwork.org 4. American College of Allergy, Asthma, and Immunology: www.acaai.org   COVID-19 Vaccine Information can be found at: ShippingScam.co.uk For questions related to vaccine distribution or appointments, please email [email or web address] or call [phone].   We realize that you might be concerned about having an allergic reaction to the COVID19 vaccines. To help with that concern, WE ARE OFFERING THE COVID19 VACCINES IN OUR OFFICE! Ask the front desk for dates!     "Like" [place] on Facebook and Instagram for our latest updates!      A healthy democracy works best when [employer] participate! Make sure you are registered to vote! If you have moved or changed any of your contact information, you will need to get this updated before voting!  In some cases, you MAY be able to register to vote online: [email or web address]

## 2021-02-02 ENCOUNTER — Ambulatory Visit (INDEPENDENT_AMBULATORY_CARE_PROVIDER_SITE_OTHER): Payer: BC Managed Care – PPO

## 2021-02-02 DIAGNOSIS — J309 Allergic rhinitis, unspecified: Secondary | ICD-10-CM | POA: Diagnosis not present

## 2021-02-09 ENCOUNTER — Telehealth: Payer: Self-pay | Admitting: Nurse Practitioner

## 2021-02-09 ENCOUNTER — Ambulatory Visit (INDEPENDENT_AMBULATORY_CARE_PROVIDER_SITE_OTHER): Payer: BC Managed Care – PPO

## 2021-02-09 DIAGNOSIS — J309 Allergic rhinitis, unspecified: Secondary | ICD-10-CM | POA: Diagnosis not present

## 2021-02-09 NOTE — Telephone Encounter (Signed)
Pt called in 8:18am stating she called [facility] and they advised they did not receive a medical records request - per scanned documentation it was sent 01/26/21 and was successful - called Almyra Free, medical records at [facility], she received the request and will try to return it today or tomorrow. Pt notified.  [facility] team - please note we need to send these records to [facility] asap for referral.

## 2021-02-10 NOTE — Telephone Encounter (Signed)
Have you received these records?

## 2021-02-13 ENCOUNTER — Other Ambulatory Visit: Payer: Self-pay

## 2021-02-14 ENCOUNTER — Ambulatory Visit (INDEPENDENT_AMBULATORY_CARE_PROVIDER_SITE_OTHER): Payer: BC Managed Care – PPO | Admitting: Nurse Practitioner

## 2021-02-14 ENCOUNTER — Encounter: Payer: Self-pay | Admitting: Nurse Practitioner

## 2021-02-14 VITALS — BP 124/78 | HR 77 | Temp 97.5°F | Ht 58.5 in | Wt 219.3 lb

## 2021-02-14 DIAGNOSIS — I10 Essential (primary) hypertension: Secondary | ICD-10-CM | POA: Diagnosis not present

## 2021-02-14 DIAGNOSIS — Z6841 Body Mass Index (BMI) 40.0 and over, adult: Secondary | ICD-10-CM

## 2021-02-14 DIAGNOSIS — E1165 Type 2 diabetes mellitus with hyperglycemia: Secondary | ICD-10-CM | POA: Diagnosis not present

## 2021-02-14 DIAGNOSIS — K2100 Gastro-esophageal reflux disease with esophagitis, without bleeding: Secondary | ICD-10-CM

## 2021-02-14 DIAGNOSIS — R600 Localized edema: Secondary | ICD-10-CM

## 2021-02-14 LAB — BASIC METABOLIC PANEL
BUN: 19 mg/dL (ref 6–23)
CO2: 28 mEq/L (ref 19–32)
Calcium: 9.9 mg/dL (ref 8.4–10.5)
Chloride: 103 mEq/L (ref 96–112)
Creatinine, Ser: 0.97 mg/dL (ref 0.40–1.20)
GFR: 64.19 mL/min (ref >60.00)
Glucose, Bld: 93 mg/dL (ref 70–99)
Potassium: 3.8 mEq/L (ref 3.5–5.1)
Sodium: 139 mEq/L (ref 135–145)

## 2021-02-14 LAB — HEMOGLOBIN A1C: Hgb A1c MFr Bld: 6.8 % — ABNORMAL HIGH (ref 4.6–6.5)

## 2021-02-14 MED ORDER — LOSARTAN POTASSIUM 100 MG PO TABS: 100.0000 mg | ORAL_TABLET | Freq: Every day | ORAL | 3 refills | Status: DC

## 2021-02-14 MED ORDER — TRIAMTERENE-HCTZ 75-50 MG PO TABS: 0.5000 | ORAL_TABLET | Freq: Every day | ORAL | 1 refills | Status: DC

## 2021-02-14 MED ORDER — METFORMIN HCL ER 500 MG PO TB24: 500.0000 mg | ORAL_TABLET | Freq: Every day | ORAL | 1 refills | Status: DC

## 2021-02-14 NOTE — Assessment & Plan Note (Signed)
She has made lifestyle changes in last 67month: daily exercise and low fat/carb diet This has led to 6lbs of weight loss. Encourage to continue with current diet and exercise Wt Readings from Last 3 Encounters:  02/14/21 219 lb 4.8 oz (99.5 kg)  12/13/20 225 lb (102.1 kg)  10/16/20 226 lb (102.5 kg)

## 2021-02-14 NOTE — Assessment & Plan Note (Signed)
BP at goal BP Readings from Last 3 Encounters:  02/14/21 124/78  01/19/21 138/72  12/13/20 128/62  repeat BMP: normal Maintain current medication doses

## 2021-02-14 NOTE — Progress Notes (Signed)
Subjective:  Patient ID: Mackenzie Weber, female    DOB: 10-Jan-1961  Age: 60 y.o. MRN: [id number]  CC: Follow-up (6 month f/u on DM and HTN/Pt checks blood sugars at home and brought meter with numbers/Pt is fasting. )  HPI  Hypertensive disorder BP at goal BP Readings from Last 3 Encounters:  02/14/21 124/78  01/19/21 138/72  12/13/20 128/62  repeat BMP: normal Maintain current medication doses  Gastroesophageal reflux disease Increase GERD symptoms No improvement with diet changes. No melena She takes nexium prn.  Advised to resume nexium daily F/up with GI if no improvement in 2weeks  Type 2 diabetes mellitus with hyperglycemia, without long-term current use of insulin (HCC) Controlled Home glucose: 85-120 No neuropathy  Repeat HbA1c: 6.8% Maintain metformin dose  Morbid obesity due to excess calories ([place]) She has made lifestyle changes in last 71month daily exercise and low fat/carb diet This has led to 6lbs of weight loss. Encourage to continue with current diet and exercise Wt Readings from Last 3 Encounters:  02/14/21 219 lb 4.8 oz (99.5 kg)  12/13/20 225 lb (102.1 kg)  10/16/20 226 lb (102.5 kg)     Wt Readings from Last 3 Encounters:  02/14/21 219 lb 4.8 oz (99.5 kg)  12/13/20 225 lb (102.1 kg)  10/16/20 226 lb (102.5 kg)    Reviewed past Medical, Social and Family history today.  Outpatient Medications Prior to Visit  Medication Sig Dispense Refill  . azelastine (ASTELIN) 0.1 % nasal spray 1-2 sprays  each nostril twice daily as needed 90 mL 1  . blood glucose meter kit and supplies KIT Check blood sugar once daily. One Touch Verio Reflect Meter 1 each 0  . Budeson-Glycopyrrol-Formoterol (BREZTRI AEROSPHERE) 160-9-4.8 MCG/ACT AERO Inhale into the lungs.    .Marland KitchenEPINEPHrine (AUVI-Q) 0.3 mg/0.3 mL IJ SOAJ injection Inject 0.3 mg into the muscle as needed for anaphylaxis. 2 each 1  . esomeprazole (NEXIUM) 20 MG capsule Take 20 mg by mouth daily at 12  noon. 1 capsule 30 minutes before Breakfast Daily    . estradiol (ESTRACE) 0.1 MG/GM vaginal cream Place vaginally.    . Fluticasone Propionate (XHANCE) 93 MCG/ACT EXHU Place 2 sprays into the nose 2 (two) times daily as needed. 32 mL 1  . glucose blood (ONETOUCH VERIO) test strip USE TO TEST BLOOD SUGAR ONCE DAILY 100 strip 3  . Lancets (ONETOUCH DELICA PLUS [id number] MISC USE TO TEST BLOOD SUGAR ONCE DAILY 100 each 3  . montelukast (SINGULAIR) 10 MG tablet Take 1 tablet (10 mg total) by mouth at bedtime. FOR SEASONAL ALLERGY 30 tablet 5  . Multiple Vitamin (MULTIVITAMIN) capsule Take 1 capsule by mouth daily.    . NON FORMULARY 2 allergy injections every week    . Olopatadine HCl (PATADAY) 0.2 % SOLN Place 1 drop into both eyes daily as needed. 2.5 mL 5  . PROAIR HFA 108 (90 Base) MCG/ACT inhaler INHALE 2 PUFFS BY MOUTH EVERY 4 HOURS AS NEEDED FOR WHEEZING FOR SHORTNESS OF BREATH 18 g 1  . budesonide-formoterol (SYMBICORT) 80-4.5 MCG/ACT inhaler Inhale 2 puffs into the lungs 2 (two) times daily. Pt. Will start after she finishes her advair 45-21.    . fluticasone-salmeterol (ADVAIR HFA) 45-21 MCG/ACT inhaler Inhale 2 puffs into the lungs 2 (two) times daily. 1 Inhaler 0  . losartan (COZAAR) 100 MG tablet Take 1 tablet (100 mg total) by mouth daily. 90 tablet 3  . metFORMIN (GLUCOPHAGE-XR) 500 MG 24 hr tablet Take 1  tablet (500 mg total) by mouth daily with breakfast. 90 tablet 1  . omeprazole (PRILOSEC) 20 MG capsule Take 1 capsule (20 mg total) by mouth 2 (two) times daily before a meal. 60 capsule 5  . triamterene-hydrochlorothiazide (MAXZIDE) 75-50 MG tablet Take 0.5 tablets by mouth daily. 45 tablet 1   No facility-administered medications prior to visit.    ROS See HPI  Objective:  BP 124/78 (BP Location: Left Arm, Patient Position: Sitting, Cuff Size: Large)   Pulse 77   Temp (!) 97.5 F (36.4 C) (Temporal)   Ht 4' 10.5" (1.486 m)   Wt 219 lb 4.8 oz (99.5 kg)   SpO2 99%   BMI  45.05 kg/m   Physical Exam Constitutional:      Appearance: She is obese.  Cardiovascular:     Rate and Rhythm: Normal rate and regular rhythm.     Pulses: Normal pulses.     Heart sounds: Normal heart sounds.  Pulmonary:     Effort: Pulmonary effort is normal.     Breath sounds: Normal breath sounds.  Abdominal:     General: Bowel sounds are normal.     Palpations: Abdomen is soft.  Musculoskeletal:     Right lower leg: No edema.     Left lower leg: No edema.  Neurological:     Mental Status: She is alert and oriented to person, place, and time.  Psychiatric:        Mood and Affect: Mood normal.        Behavior: Behavior normal.        Thought Content: Thought content normal.    Assessment & Plan:  This visit occurred during the SARS-CoV-2 public health emergency.  Safety protocols were in place, including screening questions prior to the visit, additional usage of staff PPE, and extensive cleaning of exam room while observing appropriate contact time as indicated for disinfecting solutions.   Mackenzie Weber was seen today for follow-up.  Diagnoses and all orders for this visit:  Primary hypertension -     Basic metabolic panel  Gastroesophageal reflux disease with esophagitis without hemorrhage  Type 2 diabetes mellitus with hyperglycemia, without long-term current use of insulin (HCC) -     Hemoglobin A1c -     Basic metabolic panel -     metFORMIN (GLUCOPHAGE-XR) 500 MG 24 hr tablet; Take 1 tablet (500 mg total) by mouth daily with breakfast.  Class 3 severe obesity due to excess calories without serious comorbidity with body mass index (BMI) of 45.0 to 49.9 in adult [facility])  Essential hypertension -     losartan (COZAAR) 100 MG tablet; Take 1 tablet (100 mg total) by mouth daily. -     triamterene-hydrochlorothiazide (MAXZIDE) 75-50 MG tablet; Take 0.5 tablets by mouth daily.  Bilateral lower extremity edema -     triamterene-hydrochlorothiazide (MAXZIDE) 75-50 MG tablet;  Take 0.5 tablets by mouth daily.    Problem List Items Addressed This Visit      Cardiovascular and Mediastinum   Hypertensive disorder - Primary    BP at goal BP Readings from Last 3 Encounters:  02/14/21 124/78  01/19/21 138/72  12/13/20 128/62  repeat BMP: normal Maintain current medication doses      Relevant Medications   losartan (COZAAR) 100 MG tablet   triamterene-hydrochlorothiazide (MAXZIDE) 75-50 MG tablet   Other Relevant Orders   Basic metabolic panel (Completed)     Digestive   Gastroesophageal reflux disease    Increase GERD symptoms No  improvement with diet changes. No melena She takes nexium prn.  Advised to resume nexium daily F/up with GI if no improvement in 2weeks        Endocrine   Type 2 diabetes mellitus with hyperglycemia, without long-term current use of insulin (HCC)    Controlled Home glucose: 85-120 No neuropathy  Repeat HbA1c: 6.8% Maintain metformin dose      Relevant Medications   losartan (COZAAR) 100 MG tablet   metFORMIN (GLUCOPHAGE-XR) 500 MG 24 hr tablet   Other Relevant Orders   Hemoglobin A1c (Completed)   Basic metabolic panel (Completed)     Other   Bilateral lower extremity edema   Relevant Medications   triamterene-hydrochlorothiazide (MAXZIDE) 75-50 MG tablet    Other Visit Diagnoses    Class 3 severe obesity due to excess calories without serious comorbidity with body mass index (BMI) of 45.0 to 49.9 in adult (HCC)   (Chronic)     Relevant Medications   metFORMIN (GLUCOPHAGE-XR) 500 MG 24 hr tablet   Essential hypertension       Relevant Medications   losartan (COZAAR) 100 MG tablet   triamterene-hydrochlorothiazide (MAXZIDE) 75-50 MG tablet      Follow-up: Return in about 6 months (around 08/17/2021) for CPE (fasting).  Wilfred Lacy, NP

## 2021-02-14 NOTE — Patient Instructions (Signed)
Go to lab for blood draw Maintain current medications Try metamucil or colace for constipation Resume nexium for GERD symptoms If no improvement in GI symptoms, schedule an appt with GI

## 2021-02-14 NOTE — Assessment & Plan Note (Signed)
Increase GERD symptoms No improvement with diet changes. No melena She takes nexium prn.  Advised to resume nexium daily F/up with GI if no improvement in 2weeks

## 2021-02-14 NOTE — Assessment & Plan Note (Signed)
Controlled Home glucose: 85-120 No neuropathy  Repeat HbA1c: 6.8% Maintain metformin dose

## 2021-02-16 NOTE — Telephone Encounter (Signed)
Records have been received and they were given to [facility].

## 2021-02-17 ENCOUNTER — Ambulatory Visit (INDEPENDENT_AMBULATORY_CARE_PROVIDER_SITE_OTHER): Payer: BC Managed Care – PPO

## 2021-02-17 DIAGNOSIS — J309 Allergic rhinitis, unspecified: Secondary | ICD-10-CM

## 2021-02-22 ENCOUNTER — Telehealth: Payer: Self-pay | Admitting: Family

## 2021-02-22 MED ORDER — BREZTRI AEROSPHERE 160-9-4.8 MCG/ACT IN AERO: 2.0000 | INHALATION_SPRAY | Freq: Every day | RESPIRATORY_TRACT | 5 refills | Status: DC

## 2021-02-22 NOTE — Telephone Encounter (Signed)
Per Apolonio Schneiders, patient received a sample of Breztri and now is needing it called to pharmacy. Breztri sent to [facility] pharmacyx 1 with 5 refills. Patient informed.

## 2021-02-22 NOTE — Telephone Encounter (Signed)
Pt request refill for [employer]

## 2021-03-03 ENCOUNTER — Ambulatory Visit (INDEPENDENT_AMBULATORY_CARE_PROVIDER_SITE_OTHER): Payer: BC Managed Care – PPO

## 2021-03-03 DIAGNOSIS — J309 Allergic rhinitis, unspecified: Secondary | ICD-10-CM

## 2021-03-08 ENCOUNTER — Ambulatory Visit (INDEPENDENT_AMBULATORY_CARE_PROVIDER_SITE_OTHER): Payer: BC Managed Care – PPO

## 2021-03-08 DIAGNOSIS — J309 Allergic rhinitis, unspecified: Secondary | ICD-10-CM

## 2021-03-15 ENCOUNTER — Ambulatory Visit (INDEPENDENT_AMBULATORY_CARE_PROVIDER_SITE_OTHER): Payer: BC Managed Care – PPO

## 2021-03-15 DIAGNOSIS — J309 Allergic rhinitis, unspecified: Secondary | ICD-10-CM | POA: Diagnosis not present

## 2021-03-24 ENCOUNTER — Ambulatory Visit (INDEPENDENT_AMBULATORY_CARE_PROVIDER_SITE_OTHER): Payer: BC Managed Care – PPO

## 2021-03-24 DIAGNOSIS — J309 Allergic rhinitis, unspecified: Secondary | ICD-10-CM | POA: Diagnosis not present

## 2021-03-30 ENCOUNTER — Ambulatory Visit (INDEPENDENT_AMBULATORY_CARE_PROVIDER_SITE_OTHER): Payer: BC Managed Care – PPO

## 2021-03-30 DIAGNOSIS — J309 Allergic rhinitis, unspecified: Secondary | ICD-10-CM

## 2021-04-03 ENCOUNTER — Encounter: Payer: Self-pay | Admitting: Gastroenterology

## 2021-04-03 ENCOUNTER — Other Ambulatory Visit: Payer: Self-pay

## 2021-04-03 ENCOUNTER — Ambulatory Visit (INDEPENDENT_AMBULATORY_CARE_PROVIDER_SITE_OTHER): Payer: BC Managed Care – PPO | Admitting: Gastroenterology

## 2021-04-03 VITALS — BP 110/70 | HR 67 | Ht 58.5 in | Wt 221.1 lb

## 2021-04-03 DIAGNOSIS — K2270 Barrett's esophagus without dysplasia: Secondary | ICD-10-CM

## 2021-04-03 DIAGNOSIS — K449 Diaphragmatic hernia without obstruction or gangrene: Secondary | ICD-10-CM

## 2021-04-03 DIAGNOSIS — R1013 Epigastric pain: Secondary | ICD-10-CM | POA: Diagnosis not present

## 2021-04-03 DIAGNOSIS — K219 Gastro-esophageal reflux disease without esophagitis: Secondary | ICD-10-CM

## 2021-04-03 DIAGNOSIS — K581 Irritable bowel syndrome with constipation: Secondary | ICD-10-CM

## 2021-04-03 MED ORDER — ESOMEPRAZOLE MAGNESIUM 20 MG PO CPDR: 20.0000 mg | DELAYED_RELEASE_CAPSULE | Freq: Two times a day (BID) | ORAL | 11 refills | Status: DC

## 2021-04-03 NOTE — Progress Notes (Signed)
Chief Complaint: GI eval  Referring Provider:  Flossie Buffy, NP      ASSESSMENT AND PLAN;   #1. GERD with small HH with Barrett's on GE Jn Bx  #2. Epi pain. S/P chole in past. Nl CMP  #3. IBS-C. Neg colon 06/2019, rpt due 06/2024 d/t H/O adenomatous polyps.  #4.  Longstanding microcytic anemia with heme neg stools. ?Hemoglobinopathy. Neg colon 06/2019   Plan: -EGD for further eval. -Nexium 51m po bid #60, 11 refills -Miralax 17g po qd -If above WU is neg, and still with problems, proceed with CT AP. -Encouraged her to continue losing weight.  Nonpharmacologic means of reflux control was stressed.   HPI:    Mackenzie SHEERis a 60y.o. female  GERD, DM2, obesity, SB adhesions, S/P cholecystectomy, TAH with BSO  With H/O heartburn x 20 years, has been on Nexium and having some breakthrough symptoms.  She also has postprandial epigastric discomfort x last 2 years, no N/V.  Denies burping up food eaten 24 hours before.  Underwent EGD as below showing small hiatal hernia and Barrett's esophagus on biopsies.  She has been advised to repeat EGD.  Has postprandial abdominal bloating, longstanding constipation with pellet-like stools.  Has been using mag citrate on as needed basis.  No melena or hematochezia.  No fever chills or night sweats.  No recent weight loss.  Had negative colonoscopy as below.  She has previous history of colonic polyps.  Family history of colon cancer in a distant second-degree relative.  She always had low hemoglobin with significant microcytosis.  In 2013 her hemoglobin was 10.7 with MCV 66.  Most recently her hemoglobin was 11 with MCV 67.  She had heme-negative stools.    Past GI work-up:  Colonoscopy 06/24/2019 (Dr RDorrene German -Good prep -Small internal hemorrhoids -Otherwise normal colonoscopy. -Repeat in 5 years due to previous H/O adenomatous polyps.  EGD 05/12/2018 -Small HH -GE junction biopsies with intestinal metaplasia consistent  with Barrett's. 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  No dysplasia. -Repeat EGD in 3 years.  CT AP with contrast 01/2011 1.  No acute abnormality on CT of the abdomen and pelvis.  2.  No renal or ureteral calculi.  3.  The terminal ileum is well seen and appears normal.  The  patient has previously undergone appendectomy and hysterectomy.  No  adnexal lesion is seen.  4.  Probable left parapelvic cyst along the inferior pelvis but the  attenuation is slightly higher than would be expected.  Consider  renal ultrasound to assess further.  Op note 10/1996: L salpingo-oophorectomy with partial SB resection d/t extensive adhesions, and appendicectomy.     Past Medical History:  Diagnosis Date   Anemia    Asthma    Diabetes mellitus without complication (HCC)    Foot pain, right    Hypertension    IBS (irritable bowel syndrome)    Sciatica of left side    Seasonal allergies     Past Surgical History:  Procedure Laterality Date   ABDOMINAL HYSTERECTOMY     APPENDECTOMY     BLADDER SUSPENSION  2012   TVT   BREAST SURGERY  2001   /biopsy-benign   CHOLECYSTECTOMY     COLONOSCOPY  06/24/2019   [facility]   ESOPHAGOGASTRODUODENOSCOPY  05/12/2018   [facility]    EXCISIONAL HEMORRHOIDECTOMY     KNEE SURGERY     right   SHOULDER SURGERY  Left 02/13/2018   Lipoma removed    Small Bowel Surgery     TONSILLECTOMY     TOTAL ABDOMINAL HYSTERECTOMY W/ BILATERAL SALPINGOOPHORECTOMY  1989   LSO-1987; RsO-1998   WRIST SURGERY     carpal tunnel repair    Family History  Problem Relation Age of Onset   Diabetes Mother    Hypertension Mother    Asthma Mother    Heart disease Mother    Allergic rhinitis Mother    Diabetes Father    Hypertension Father    Heart disease Father    Hypertension Sister    Diabetes Sister    Cancer Sister 54       breast   Diabetes Brother    Hypertension Paternal Uncle    Colon cancer Maternal  Uncle    Pancreatic cancer Neg Hx    Stomach cancer Neg Hx    Esophageal cancer Neg Hx    Liver disease Neg Hx     Social History   Tobacco Use   Smoking status: Never   Smokeless tobacco: Never  Vaping Use   Vaping Use: Never used  Substance Use Topics   Alcohol use: No   Drug use: No    Current Outpatient Medications  Medication Sig Dispense Refill   azelastine (ASTELIN) 0.1 % nasal spray 1-2 sprays  each nostril twice daily as needed 90 mL 1   blood glucose meter kit and supplies KIT Check blood sugar once daily. One Touch Verio Reflect Meter 1 each 0   Budeson-Glycopyrrol-Formoterol (BREZTRI AEROSPHERE) 160-9-4.8 MCG/ACT AERO Inhale 2 puffs into the lungs daily. Use with spacer. Rinse, gargle and spit out after use. 10.7 g 5   EPINEPHrine (AUVI-Q) 0.3 mg/0.3 mL IJ SOAJ injection Inject 0.3 mg into the muscle as needed for anaphylaxis. 2 each 1   esomeprazole (NEXIUM) 20 MG capsule Take 20 mg by mouth daily at 12 noon. 1 capsule 30 minutes before Breakfast Daily     estradiol (ESTRACE) 0.1 MG/GM vaginal cream Place vaginally.     Fluticasone Propionate (XHANCE) 93 MCG/ACT EXHU Place 2 sprays into the nose 2 (two) times daily as needed. 32 mL 1   glucose blood (ONETOUCH VERIO) test strip USE TO TEST BLOOD SUGAR ONCE DAILY 100 strip 3   Lancets (ONETOUCH DELICA PLUS STMHDQ22W) MISC USE TO TEST BLOOD SUGAR ONCE DAILY 100 each 3   losartan (COZAAR) 100 MG tablet Take 1 tablet (100 mg total) by mouth daily. 90 tablet 3   metFORMIN (GLUCOPHAGE-XR) 500 MG 24 hr tablet Take 1 tablet (500 mg total) by mouth daily with breakfast. 90 tablet 1   montelukast (SINGULAIR) 10 MG tablet Take 1 tablet (10 mg total) by mouth at bedtime. FOR SEASONAL ALLERGY 30 tablet 5   Multiple Vitamin (MULTIVITAMIN) capsule Take 1 capsule by mouth daily.     NON FORMULARY 2 allergy injections every week     Olopatadine HCl (PATADAY) 0.2 % SOLN Place 1 drop into both eyes daily as needed. 2.5 mL 5   PROAIR HFA  108 (90 Base) MCG/ACT inhaler INHALE 2 PUFFS BY MOUTH EVERY 4 HOURS AS NEEDED FOR WHEEZING FOR SHORTNESS OF BREATH 18 g 1   triamterene-hydrochlorothiazide (MAXZIDE) 75-50 MG tablet Take 0.5 tablets by mouth daily. 45 tablet 1   No current facility-administered medications for this visit.    Allergies  Allergen Reactions   Cortisone     Red area around injection site x 85mh   Depo-Medrol [Methylprednisolone Sodium Succ]  Nausea Only and Other (See Comments)    Dizziness   Aspirin Anxiety and Other (See Comments)    Rapid Heart beat    Review of Systems:  Constitutional: Denies fever, chills, diaphoresis, appetite change and fatigue.  HEENT: Denies photophobia, eye pain, redness, hearing loss, ear pain, congestion, sore throat, rhinorrhea, sneezing, mouth sores, neck pain, neck stiffness and tinnitus.   Respiratory: Denies SOB, DOE, cough, chest tightness,  and wheezing.   Cardiovascular: Denies chest pain, palpitations and leg swelling.  Genitourinary: Denies dysuria, urgency, frequency, hematuria, flank pain and difficulty urinating.  Musculoskeletal: Denies myalgias, back pain, joint swelling, arthralgias and gait problem.  Skin: No rash.  Neurological: Denies dizziness, seizures, syncope, weakness, light-headedness, numbness and headaches.  Hematological: Denies adenopathy. Easy bruising, personal or family bleeding history  Psychiatric/Behavioral: No anxiety or depression     Physical Exam:    BP 110/70   Pulse 67   Ht 4' 10.5" (1.486 m)   Wt 221 lb 2 oz (100.3 kg)   SpO2 99%   BMI 45.43 kg/m  Wt Readings from Last 3 Encounters:  04/03/21 221 lb 2 oz (100.3 kg)  02/14/21 219 lb 4.8 oz (99.5 kg)  12/13/20 225 lb (102.1 kg)   Constitutional:  Well-developed, in no acute distress. Psychiatric: Normal mood and affect. Behavior is normal. HEENT: Pupils normal.  Conjunctivae are normal. No scleral icterus. Neck supple.  Cardiovascular: Normal rate, regular rhythm. No  edema Pulmonary/chest: Effort normal and breath sounds normal. No wheezing, rales or rhonchi. Abdominal: Soft, nondistended. Nontender. Bowel sounds active throughout. There are no masses palpable. No hepatomegaly. Rectal: Deferred Neurological: Alert and oriented to person place and time. Skin: Skin is warm and dry. No rashes noted.  Data Reviewed: I have personally reviewed following labs and imaging studies  CBC: CBC Latest Ref Rng & Units 08/11/2020 03/18/2018 04/13/2016  WBC 4.0 - 10.5 K/uL 5.6 6.7 5.9  Hemoglobin 12.0 - 15.0 g/dL 11.0(L) 11.6(L) 10.9(L)  Hematocrit 36.0 - 46.0 % 35.0(L) 38.0 34.4(L)  Platelets 150.0 - 400.0 K/uL 290.0 340 268    CMP: CMP Latest Ref Rng & Units 02/14/2021 12/13/2020 08/11/2020  Glucose 70 - 99 mg/dL 93 124(H) 97  BUN 6 - 23 mg/dL _0 Creatinine 0.40 - 1.20 mg/dL 0.97 1.10(H) 0.97  Sodium 135 - 145 mEq/L 139 140 138  Potassium 3.5 - 5.1 mEq/L 3.8 3.4(L) 3.3(L)  Chloride 96 - 112 mEq/L 103 103 102  CO2 19 - 32 mEq/L _1 Calcium 8.4 - 10.5 mg/dL 9.9 9.6 9.8  Total Protein 6.0 - 8.3 g/dL - - 7.3  Total Bilirubin 0.2 - 1.2 mg/dL - - 0.3  Alkaline Phos 39 - 117 U/L - - 55  AST 0 - 37 U/L - - 16  ALT 0 - 35 U/L - - 10        Carmell Austria, MD 04/03/2021, 10:16 AM  Cc: Flossie Buffy, NP

## 2021-04-03 NOTE — Patient Instructions (Addendum)
If you are age 59 or older, your body mass index should be between 23-30. Your Body mass index is 45.43 kg/m. If this is out of the aforementioned range listed, please consider follow up with your Primary Care Provider.  If you are age 18 or younger, your body mass index should be between 19-25. Your Body mass index is 45.43 kg/m. If this is out of the aformentioned range listed, please consider follow up with your Primary Care Provider.   __________________________________________________________  The [facility] providers would like to encourage you to use [facility] to communicate with providers for non-urgent requests or questions.  Due to long hold times on the telephone, sending your provider a message by [facility] may be a faster and more efficient way to get a response.  Please allow 48 business hours for a response.  Please remember that this is for non-urgent requests.   You have been scheduled for an endoscopy. Please follow written instructions given to you at your visit today. If you use inhalers (even only as needed), please bring them with you on the day of your procedure.  We have sent the following medications to your pharmacy for you to pick up at your convenience: Nexium 2 times daily  Please purchase the following medications over the counter and take as directed: Miralax 17g daily in [place] water.  Call with any questions or concerns  Thank you,  Dr. Jackquline Denmark

## 2021-04-04 ENCOUNTER — Encounter: Payer: Self-pay | Admitting: Gastroenterology

## 2021-04-06 ENCOUNTER — Ambulatory Visit (INDEPENDENT_AMBULATORY_CARE_PROVIDER_SITE_OTHER): Payer: BC Managed Care – PPO

## 2021-04-06 DIAGNOSIS — J309 Allergic rhinitis, unspecified: Secondary | ICD-10-CM | POA: Diagnosis not present

## 2021-04-10 ENCOUNTER — Ambulatory Visit (INDEPENDENT_AMBULATORY_CARE_PROVIDER_SITE_OTHER): Payer: BC Managed Care – PPO

## 2021-04-10 DIAGNOSIS — J309 Allergic rhinitis, unspecified: Secondary | ICD-10-CM

## 2021-04-11 LAB — HM DIABETES EYE EXAM

## 2021-04-12 ENCOUNTER — Ambulatory Visit (AMBULATORY_SURGERY_CENTER): Payer: BC Managed Care – PPO | Admitting: Gastroenterology

## 2021-04-12 ENCOUNTER — Other Ambulatory Visit: Payer: Self-pay

## 2021-04-12 ENCOUNTER — Encounter: Payer: Self-pay | Admitting: Gastroenterology

## 2021-04-12 ENCOUNTER — Encounter: Payer: Self-pay | Admitting: Nurse Practitioner

## 2021-04-12 VITALS — BP 119/71 | HR 65 | Temp 98.0°F | Resp 17 | Ht <= 58 in | Wt 221.0 lb

## 2021-04-12 DIAGNOSIS — K219 Gastro-esophageal reflux disease without esophagitis: Secondary | ICD-10-CM | POA: Diagnosis not present

## 2021-04-12 DIAGNOSIS — K2270 Barrett's esophagus without dysplasia: Secondary | ICD-10-CM

## 2021-04-12 DIAGNOSIS — K449 Diaphragmatic hernia without obstruction or gangrene: Secondary | ICD-10-CM | POA: Diagnosis not present

## 2021-04-12 MED ORDER — SODIUM CHLORIDE 0.9 % IV SOLN: 500.0000 mL | Freq: Once | INTRAVENOUS | Status: DC

## 2021-04-12 NOTE — Progress Notes (Signed)
Called to room to assist during endoscopic procedure.  Patient ID and intended procedure confirmed with present staff. Received instructions for my participation in the procedure from the performing physician.  

## 2021-04-12 NOTE — Progress Notes (Signed)
C.W. vital signs. 

## 2021-04-12 NOTE — Patient Instructions (Signed)
YOU HAD AN ENDOSCOPIC PROCEDURE TODAY AT THE [facility]:   Refer to the procedure report that was given to you for any specific questions about what was found during the examination.  If the procedure report does not answer your questions, please call your gastroenterologist to clarify.  If you requested that your care partner not be given the details of your procedure findings, then the procedure report has been included in a sealed envelope for you to review at your convenience later.  YOU SHOULD EXPECT: Some feelings of bloating in the abdomen. Passage of more gas than usual.  Walking can help get rid of the air that was put into your GI tract during the procedure and reduce the bloating. If you had a lower endoscopy (such as a colonoscopy or flexible sigmoidoscopy) you may notice spotting of blood in your stool or on the toilet paper. If you underwent a bowel prep for your procedure, you may not have a normal bowel movement for a few days.  Please Note:  You might notice some irritation and congestion in your nose or some drainage.  This is from the oxygen used during your procedure.  There is no need for concern and it should clear up in a day or so.  SYMPTOMS TO REPORT IMMEDIATELY:   Following lower endoscopy (colonoscopy or flexible sigmoidoscopy):  Excessive amounts of blood in the stool  Significant tenderness or worsening of abdominal pains  Swelling of the abdomen that is new, acute  Fever of 100F or higher   Following upper endoscopy (EGD)  Vomiting of blood or coffee ground material  New chest pain or pain under the shoulder blades  Painful or persistently difficult swallowing  New shortness of breath  Fever of 100F or higher  Black, tarry-looking stools  For urgent or emergent issues, a gastroenterologist can be reached at any hour by calling [phone]. Do not use MyChart messaging for urgent concerns.    DIET:  We do recommend a small meal at first, but  then you may proceed to your regular diet.  Drink plenty of fluids but you should avoid alcoholic beverages for 24 hours.  ACTIVITY:  You should plan to take it easy for the rest of today and you should NOT DRIVE or use heavy machinery until tomorrow (because of the sedation medicines used during the test).    FOLLOW UP: Our staff will call the number listed on your records 48-72 hours following your procedure to check on you and address any questions or concerns that you may have regarding the information given to you following your procedure. If we do not reach you, we will leave a message.  We will attempt to reach you two times.  During this call, we will ask if you have developed any symptoms of COVID 19. If you develop any symptoms (ie: fever, flu-like symptoms, shortness of breath, cough etc.) before then, please call [phone].  If you test positive for Covid 19 in the 2 weeks post procedure, please call and report this information to us.    If any biopsies were taken you will be contacted by phone or by letter within the next 1-3 weeks.  Please call us at [phone] if you have not heard about the biopsies in 3 weeks.    SIGNATURES/CONFIDENTIALITY: You and/or your care partner have signed paperwork which will be entered into your electronic medical record.  These signatures attest to the fact that that the information above on   your After Visit Summary has been reviewed and is understood.  Full responsibility of the confidentiality of this discharge information lies with you and/or your care-partner. 

## 2021-04-12 NOTE — Progress Notes (Signed)
A/ox3, pleased with MAC, report to RN 

## 2021-04-12 NOTE — Progress Notes (Signed)
Pt's states no medical or surgical changes since previsit or office visit. 

## 2021-04-12 NOTE — Op Note (Signed)
[facility] Patient Name: Mackenzie Weber Procedure Date: 04/12/2021 8:59 AM MRN: [id number] Endoscopist: Jackquline Denmark , MD Age: 60 Referring MD:  Date of Birth: Dec 06, 1960 Gender: Female Account #: [id number] Procedure:                Upper GI endoscopy Indications:              Follow-up of Barrett's esophagus. GERD Medicines:                Monitored Anesthesia Care Procedure:                Pre-Anesthesia Assessment:                           - Prior to the procedure, a History and Physical                            was performed, and patient medications and                            allergies were reviewed. The patient's tolerance of                            previous anesthesia was also reviewed. The risks                            and benefits of the procedure and the sedation                            options and risks were discussed with the patient.                            All questions were answered, and informed consent                            was obtained. Prior Anticoagulants: The patient has                            taken no previous anticoagulant or antiplatelet                            agents. ASA Grade Assessment: II - A patient with                            mild systemic disease. After reviewing the risks                            and benefits, the patient was deemed in                            satisfactory condition to undergo the procedure.                           After obtaining informed consent, the endoscope was  passed under direct vision. Throughout the                            procedure, the patient's blood pressure, pulse, and                            oxygen saturations were monitored continuously. The                            GIF [id number] #[id number] was introduced through the                            mouth, and advanced to the second part of duodenum.                            The upper GI  endoscopy was accomplished without                            difficulty. The patient tolerated the procedure                            well. Scope In: Scope Out: Findings:                 The examined esophagus was normal.                           The Z-line was minimally irregular and was found 35                            cm from the incisors. Biopsies were taken with a                            cold forceps for histology, directed by [facility].                           A small hiatal hernia was present. Best visualized                            on full distention of lower esophagus and when                            patient would cough.                           The entire examined stomach was normal. Biopsies                            were taken with a cold forceps for histology.                           The examined duodenum was normal. Biopsies for                            histology were taken  with a cold forceps for                            evaluation of celiac disease. Complications:            No immediate complications. Estimated Blood Loss:     Estimated blood loss: none. Impression:               - Z-line minimally irregular, 35 cm from the                            incisors. Biopsied.                           - Small transient hiatal hernia. Recommendation:           - Patient has a contact number available for                            emergencies. The signs and symptoms of potential                            delayed complications were discussed with the                            patient. Return to normal activities tomorrow.                            Written discharge instructions were provided to the                            patient.                           - Resume previous diet.                           - Continue present medications.                           - Await pathology results.                           - The findings and recommendations  were discussed                            with the patient's family. Jackquline Denmark, MD 04/12/2021 9:15:05 AM This report has been signed electronically.

## 2021-04-14 ENCOUNTER — Telehealth: Payer: Self-pay

## 2021-04-14 NOTE — Telephone Encounter (Signed)
  Follow up Call-  Call back number 04/12/2021  Post procedure Call Back phone  # [phone]  Permission to leave phone message Yes  Some recent data might be hidden     Patient questions:  Do you have a fever, pain , or abdominal swelling? No. Pain Score  0 *  Have you tolerated food without any problems? Yes.    Have you been able to return to your normal activities? Yes.    Do you have any questions about your discharge instructions: Diet   No. Medications  No. Follow up visit  No.  Do you have questions or concerns about your Care? No.  Actions: * If pain score is 4 or above: No action needed, pain <4.

## 2021-04-20 ENCOUNTER — Encounter: Payer: Self-pay | Admitting: Allergy & Immunology

## 2021-04-20 ENCOUNTER — Other Ambulatory Visit: Payer: Self-pay

## 2021-04-20 ENCOUNTER — Ambulatory Visit (INDEPENDENT_AMBULATORY_CARE_PROVIDER_SITE_OTHER): Payer: BC Managed Care – PPO | Admitting: Allergy & Immunology

## 2021-04-20 VITALS — BP 120/70 | HR 72 | Temp 97.7°F | Resp 16 | Ht <= 58 in | Wt 219.4 lb

## 2021-04-20 DIAGNOSIS — J4540 Moderate persistent asthma, uncomplicated: Secondary | ICD-10-CM | POA: Diagnosis not present

## 2021-04-20 DIAGNOSIS — J302 Other seasonal allergic rhinitis: Secondary | ICD-10-CM

## 2021-04-20 DIAGNOSIS — K219 Gastro-esophageal reflux disease without esophagitis: Secondary | ICD-10-CM | POA: Diagnosis not present

## 2021-04-20 DIAGNOSIS — J3089 Other allergic rhinitis: Secondary | ICD-10-CM

## 2021-04-20 DIAGNOSIS — J309 Allergic rhinitis, unspecified: Secondary | ICD-10-CM

## 2021-04-20 NOTE — Patient Instructions (Addendum)
1. Moderate persistent asthma without complication - Lung testing looks fairly good. - We can discuss stepping down the therapy at the next visit.  - Daily controller medication(s): Breztri two puffs TWICE daily with spacer. - Prior to physical activity: albuterol 2 puffs 10-15 minutes before physical activity. - Rescue medications: albuterol 4 puffs every 4-6 hours as needed - Changes during respiratory infections or worsening symptoms: Increase Breztri two puffs to 2 puffs twice daily for TWO WEEKS. - Asthma control goals:  * Full participation in all desired activities (may need albuterol before activity) * Albuterol use two time or less a week on average (not counting use with activity) * Cough interfering with sleep two time or less a month * Oral steroids no more than once a year * No hospitalizations  2. Perennial and seasonal allergic rhinitis - Continue with the nasal saline lavage. - Use the the azelastine one spray per nostril at least once daily AS NEEDED.  - Continue with Claritin in the morning and Singulair at night.  - Continue with allergy shots at the same schedule.   3. Gastroesophageal reflux disease - Continue with Nexium at least once daily.  4. Return in about 6 months (around 10/21/2021).    Please inform us of any Emergency Department visits, hospitalizations, or changes in symptoms. Call us before going to the ED for breathing or allergy symptoms since we might be able to fit you in for a sick visit. Feel free to contact us anytime with any questions, problems, or concerns.  It was a pleasure to meet you today!  Websites that have reliable patient information: 1. American Academy of Asthma, Allergy, and Immunology: www.aaaai.org 2. Food Allergy Research and Education (FARE): foodallergy.org 3. Mothers of Asthmatics: http://www.asthmacommunitynetwork.org 4. American College of Allergy, Asthma, and Immunology: www.acaai.org   COVID-19 Vaccine Information can  be found at: ShippingScam.co.uk For questions related to vaccine distribution or appointments, please email [email or web address] or call [phone].   We realize that you might be concerned about having an allergic reaction to the COVID19 vaccines. To help with that concern, WE ARE OFFERING THE COVID19 VACCINES IN OUR OFFICE! Ask the front desk for dates!     "Like" [place] on Facebook and Instagram for our latest updates!      A healthy democracy works best when [employer] participate! Make sure you are registered to vote! If you have moved or changed any of your contact information, you will need to get this updated before voting!  In some cases, you MAY be able to register to vote online: [email or web address]

## 2021-04-20 NOTE — Progress Notes (Signed)
FOLLOW UP  Date of Service/Encounter:  04/20/21   Assessment:   Moderate persistent asthma, uncomplicated - with improved control since using her medications as directed  Perennial and seasonal allergic rhinitis   GERD - on Nexium (followed by Dr. Lyndel Safe)  Plan/Recommendations:   1. Moderate persistent asthma without complication - Lung testing looks fairly good. - We can discuss stepping down the therapy at the next visit.  - Daily controller medication(s): Breztri two puffs TWICE daily with spacer. - Prior to physical activity: albuterol 2 puffs 10-15 minutes before physical activity. - Rescue medications: albuterol 4 puffs every 4-6 hours as needed - Changes during respiratory infections or worsening symptoms: Increase Breztri two puffs to 2 puffs twice daily for TWO WEEKS. - Asthma control goals:  * Full participation in all desired activities (may need albuterol before activity) * Albuterol use two time or less a week on average (not counting use with activity) * Cough interfering with sleep two time or less a month * Oral steroids no more than once a year * No hospitalizations  2. Perennial and seasonal allergic rhinitis - Continue with the nasal saline lavage. - Use the the azelastine one spray per nostril at least once daily AS NEEDED.  - Continue with Claritin in the morning and Singulair at night.  - Continue with allergy shots at the same schedule.   3. Gastroesophageal reflux disease - Continue with Nexium at least once daily.  4. Return in about 6 months (around 10/21/2021).     Subjective:   LANEAH Weber is a 60 y.o. female presenting today for follow up of  Chief Complaint  Patient presents with   Allergic Rhinitis    Asthma    [name] has a history of the following: Patient Active Problem List   Diagnosis Date Noted   Allergic conjunctivitis of both eyes 11/16/2020   Allergic conjunctivitis 05/30/2020   Moderate persistent asthma  05/30/2020   Grade I diastolic dysfunction 60/45/4098   Bilateral foot pain 01/13/2020   Extensor tendonitis of foot 01/13/2020   BMI 40.0-44.9, adult ([place]) 12/23/2019   Type 2 diabetes mellitus with hyperglycemia, without long-term current use of insulin ([place]) 01/01/2019   Seasonal and perennial allergic rhinitis 05/20/2018   Chronic bilateral low back pain with left-sided sciatica 05/20/2018   Lipoma of left upper extremity 01/29/2018   Muscle spasm of left lower extremity 01/29/2018   Intestinal ulcer 04/23/2017   Bilateral lower extremity edema 04/23/2017   Gastroesophageal reflux disease 03/28/2016   History of colon polyps 03/28/2016   Anemia 03/28/2016   Somatic dysfunction of cervical region 11/16/2015   Chronic sciatica 01/28/2015   Somatic dysfunction of pelvic region 01/28/2015   Chronic pelvic pain in female 01/28/2015   Somatic dysfunction of lower extremity 01/28/2015   Somatic dysfunction of sacral region 01/28/2015   Hypertensive disorder 10/22/2014   Morbid obesity due to excess calories ([place]) 10/22/2014   Abdominal muscle strain 06/07/2014   Sciatica of left side 11/03/2012    History obtained from: chart review and patient.  Mackenzie Weber is a 60 y.o. female presenting for a follow up visit.  She was last seen in April 2022.  At that time, her lung testing was stable but she was wheezing throughout all lung fields.  We started her on prednisone.  We recommended that she use her Breztri 2 puffs at least once daily.  We continue with albuterol as needed.  For her allergic rhinitis, we tried her on  Astelin 1 spray per nostril daily to help with postnasal drip and we recommended using Allegra.  We continue with her allergy shots.  We also continue with omeprazole daily.  Since last visit, she has done well.   Asthma/Respiratory Symptom History: She has been using her Breztri two puffs twice daily.  This has been controlling her symptoms well.  She has not needed her rescue  inhaler.  ACT is 23, indicating excellent asthma control.  She has not been to the hospital nor has she needed systemic steroids.  Allergic Rhinitis Symptom History: Her shots are going well . She has been on shots since 1997 evidently. She was off of the shots for a short period of time. She feels that things are going well.  She is using her Astelin on an as-needed basis.  She uses her Claritin and Singulair every night routinely.  She has not needed antibiotics at all since last visit.  GERD Symptom History: She started seeing another GI doctor two weeks ago. She was started on "a strong Nexium". She had an endoscopy done that showed a hiatal hernia.   Otherwise, there have been no changes to her past medical history, surgical history, family history, or social history.    Review of Systems  Constitutional: Negative.  Negative for chills, fever, malaise/fatigue and weight loss.  HENT: Negative.  Negative for congestion, ear discharge and ear pain.   Eyes:  Negative for pain, discharge and redness.  Respiratory:  Negative for cough, sputum production, shortness of breath and wheezing.   Cardiovascular: Negative.  Negative for chest pain and palpitations.  Gastrointestinal:  Negative for abdominal pain, constipation, diarrhea, heartburn, nausea and vomiting.  Skin: Negative.  Negative for itching and rash.  Neurological:  Negative for dizziness and headaches.  Endo/Heme/Allergies:  Negative for environmental allergies. Does not bruise/bleed easily.      Objective:   Blood pressure 120/70, pulse 72, temperature 97.7 F (36.5 C), temperature source Temporal, resp. rate 16, height 4\' 10"  (1.473 m), weight 219 lb 6.4 oz (99.5 kg), SpO2 100 %. Body mass index is 45.85 kg/m.   Physical Exam:  Physical Exam Constitutional:      Appearance: She is well-developed.  HENT:     Head: Normocephalic and atraumatic.     Right Ear: Tympanic membrane, ear canal and external ear normal.      Left Ear: Tympanic membrane, ear canal and external ear normal.     Nose: No nasal deformity, septal deviation, mucosal edema or rhinorrhea.     Right Turbinates: Not enlarged or swollen.     Left Turbinates: Not enlarged or swollen.     Right Sinus: No maxillary sinus tenderness or frontal sinus tenderness.     Left Sinus: No maxillary sinus tenderness or frontal sinus tenderness.     Mouth/Throat:     Mouth: Mucous membranes are not pale and not dry.     Pharynx: Uvula midline.  Eyes:     General: Lids are normal. No allergic shiner.       Right eye: No discharge.        Left eye: No discharge.     Conjunctiva/sclera: Conjunctivae normal.     Right eye: Right conjunctiva is not injected. No chemosis.    Left eye: Left conjunctiva is not injected. No chemosis.    Pupils: Pupils are equal, round, and reactive to light.  Cardiovascular:     Rate and Rhythm: Normal rate and regular rhythm.  Heart sounds: Normal heart sounds.  Pulmonary:     Effort: Pulmonary effort is normal. No tachypnea, accessory muscle usage or respiratory distress.     Breath sounds: Normal breath sounds. No wheezing, rhonchi or rales.  Chest:     Chest wall: No tenderness.  Lymphadenopathy:     Cervical: No cervical adenopathy.  Skin:    Coloration: Skin is not pale.     Findings: No abrasion, erythema, petechiae or rash. Rash is not papular, urticarial or vesicular.  Neurological:     Mental Status: She is alert.  Psychiatric:        Behavior: Behavior is cooperative.     Diagnostic studies:   Spirometry: Normal FEV1, FVC, and FEV1/FVC ratio. There is no scooping suggestive of obstructive disease.        Salvatore Marvel, MD  [facility]

## 2021-04-29 ENCOUNTER — Encounter: Payer: Self-pay | Admitting: Gastroenterology

## 2021-05-03 ENCOUNTER — Ambulatory Visit (INDEPENDENT_AMBULATORY_CARE_PROVIDER_SITE_OTHER): Payer: BC Managed Care – PPO

## 2021-05-03 DIAGNOSIS — J309 Allergic rhinitis, unspecified: Secondary | ICD-10-CM

## 2021-05-05 ENCOUNTER — Encounter: Payer: Self-pay | Admitting: Nurse Practitioner

## 2021-05-05 ENCOUNTER — Other Ambulatory Visit: Payer: Self-pay

## 2021-05-05 ENCOUNTER — Ambulatory Visit (INDEPENDENT_AMBULATORY_CARE_PROVIDER_SITE_OTHER): Payer: BC Managed Care – PPO | Admitting: Nurse Practitioner

## 2021-05-05 VITALS — BP 110/68 | HR 78 | Temp 97.3°F | Ht <= 58 in | Wt 220.3 lb

## 2021-05-05 DIAGNOSIS — M62838 Other muscle spasm: Secondary | ICD-10-CM

## 2021-05-05 MED ORDER — CYCLOBENZAPRINE HCL 5 MG PO TABS: 5.0000 mg | ORAL_TABLET | Freq: Every evening | ORAL | 0 refills | Status: DC | PRN

## 2021-05-05 NOTE — Patient Instructions (Signed)
Start neck exercise. Alternate between warm and cold compress as needed. Minimize use of nsaids due to GERD symptoms. Use tylenol for pain if needed.  Cervical Strain and Sprain Rehab Ask your health care provider which exercises are safe for you. Do exercises exactly as told by your health care provider and adjust them as directed. It is normal to feel mild stretching, pulling, tightness, or discomfort as you do these exercises. Stop right away if you feel sudden pain or your pain gets worse. Do not begin these exercises until told by your health care provider. Stretching and range-of-motion exercises Cervical side bending  Using good posture, sit on a stable chair or stand up. Without moving your shoulders, slowly tilt your left / right ear to your shoulder until you feel a stretch in the opposite side neck muscles. You should be looking straight ahead. Hold for __________ seconds. Repeat with the other side of your neck. Repeat __________ times. Complete this exercise __________ times a day. Cervical rotation  Using good posture, sit on a stable chair or stand up. Slowly turn your head to the side as if you are looking over your left / right shoulder. Keep your eyes level with the ground. Stop when you feel a stretch along the side and the back of your neck. Hold for __________ seconds. Repeat this by turning to your other side. Repeat __________ times. Complete this exercise __________ times a day. Thoracic extension and pectoral stretch Roll a towel or a small blanket so it is about 4 inches (10 cm) in diameter. Lie down on your back on a firm surface. Put the towel lengthwise, under your spine in the middle of your back. It should not be under your shoulder blades. The towel should line up with your spine from your middle back to your lower back. Put your hands behind your head and let your elbows fall out to your sides. Hold for __________ seconds. Repeat __________ times.  Complete this exercise __________ times a day. Strengthening exercises Isometric upper cervical flexion Lie on your back with a thin pillow behind your head and a small rolled-up towel under your neck. Gently tuck your chin toward your chest and nod your head down to look toward your feet. Do not lift your head off the pillow. Hold for __________ seconds. Release the tension slowly. Relax your neck muscles completely before you repeat this exercise. Repeat __________ times. Complete this exercise __________ times a day. Isometric cervical extension  Stand about 6 inches (15 cm) away from a wall, with your back facing the wall. Place a soft object, about 6-8 inches (15-20 cm) in diameter, between the back of your head and the wall. A soft object could be a small pillow, a ball, or a folded towel. Gently tilt your head back and press into the soft object. Keep your jaw and forehead relaxed. Hold for __________ seconds. Release the tension slowly. Relax your neck muscles completely before you repeat this exercise. Repeat __________ times. Complete this exercise __________ times a day. Posture and body mechanics Body mechanics refers to the movements and positions of your body while you do your daily activities. Posture is part of body mechanics. Good posture and healthy body mechanics can help to relieve stress in your body's tissues and joints. Good posture means that your spine is in its natural S-curve position (your spine is neutral), your shoulders are pulled back slightly, and your head is not tipped forward. The following are general guidelines for  applying improved posture andbody mechanics to your everyday activities. Sitting  When sitting, keep your spine neutral and keep your feet flat on the floor. Use a footrest, if necessary, and keep your thighs parallel to the floor. Avoid rounding your shoulders, and avoid tilting your head forward. When working at a desk or a computer, keep your  desk at a height where your hands are slightly lower than your elbows. Slide your chair under your desk so you are close enough to maintain good posture. When working at a computer, place your monitor at a height where you are looking straight ahead and you do not have to tilt your head forward or downward to look at the screen.  Standing  When standing, keep your spine neutral and keep your feet about hip-width apart. Keep a slight bend in your knees. Your ears, shoulders, and hips should line up. When you do a task in which you stand in one place for a long time, place one foot up on a stable object that is 2-4 inches (5-10 cm) high, such as a footstool. This helps keep your spine neutral.  Resting When lying down and resting, avoid positions that are most painful for you. Try to support your neck in a neutral position. You can use a contour pillow or asmall rolled-up towel. Your pillow should support your neck but not push on it. This information is not intended to replace advice given to you by your health care provider. Make sure you discuss any questions you have with your healthcare provider. Document Revised: 01/21/2019 Document Reviewed: 07/02/2018 Elsevier Patient Education  [place].

## 2021-05-05 NOTE — Progress Notes (Signed)
Subjective:  Patient ID: Mackenzie Weber, female    DOB: [birth year]  Age: 60 y.o. MRN: [id number]  CC: Neck Pain (C/O neck pain/cramps x 2 weeks, would like something for this. )  Neck Pain  This is a recurrent problem. The current episode started 1 to 4 weeks ago. The problem occurs constantly. The problem has been unchanged. The pain is associated with a sleep position. The pain is present in the left side. The quality of the pain is described as aching and cramping. The pain is moderate. The symptoms are aggravated by twisting and bending. The pain is Worse during the day. Stiffness is present In the morning. Pertinent negatives include no fever, headaches, numbness, pain with swallowing, paresis, photophobia, tingling or visual change. She has tried NSAIDs for the symptoms. The treatment provided mild relief.   Reviewed past Medical, Social and Family history today.  Outpatient Medications Prior to Visit  Medication Sig Dispense Refill   azelastine (ASTELIN) 0.1 % nasal spray 1-2 sprays  each nostril twice daily as needed 90 mL 1   blood glucose meter kit and supplies KIT Check blood sugar once daily. One Touch Verio Reflect Meter 1 each 0   Budeson-Glycopyrrol-Formoterol (BREZTRI AEROSPHERE) 160-9-4.8 MCG/ACT AERO Inhale 2 puffs into the lungs daily. Use with spacer. Rinse, gargle and spit out after use. 10.7 g 5   EPINEPHrine (AUVI-Q) 0.3 mg/0.3 mL IJ SOAJ injection Inject 0.3 mg into the muscle as needed for anaphylaxis. 2 each 1   esomeprazole (NEXIUM) 20 MG capsule Take 1 capsule (20 mg total) by mouth 2 (two) times daily. 1 capsule 30 minutes before Breakfast Daily 60 capsule 11   estradiol (ESTRACE) 0.1 MG/GM vaginal cream Place vaginally.     Fluticasone Propionate (XHANCE) 93 MCG/ACT EXHU Place 2 sprays into the nose 2 (two) times daily as needed. 32 mL 1   glucose blood (ONETOUCH VERIO) test strip USE TO TEST BLOOD SUGAR ONCE DAILY 100 strip 3   Lancets (ONETOUCH DELICA PLUS  [id number]) MISC USE TO TEST BLOOD SUGAR ONCE DAILY 100 each 3   losartan (COZAAR) 100 MG tablet Take 1 tablet (100 mg total) by mouth daily. 90 tablet 3   metFORMIN (GLUCOPHAGE-XR) 500 MG 24 hr tablet Take 1 tablet (500 mg total) by mouth daily with breakfast. 90 tablet 1   montelukast (SINGULAIR) 10 MG tablet Take 1 tablet (10 mg total) by mouth at bedtime. FOR SEASONAL ALLERGY (Patient taking differently: Take 10 mg by mouth as needed. FOR SEASONAL ALLERGY) 30 tablet 5   Multiple Vitamin (MULTIVITAMIN) capsule Take 1 capsule by mouth daily.     NON FORMULARY 2 allergy injections every week     Olopatadine HCl (PATADAY) 0.2 % SOLN Place 1 drop into both eyes daily as needed. 2.5 mL 5   PROAIR HFA 108 (90 Base) MCG/ACT inhaler INHALE 2 PUFFS BY MOUTH EVERY 4 HOURS AS NEEDED FOR WHEEZING FOR SHORTNESS OF BREATH 18 g 1   triamterene-hydrochlorothiazide (MAXZIDE) 75-50 MG tablet Take 0.5 tablets by mouth daily. 45 tablet 1   No facility-administered medications prior to visit.    ROS See HPI  Objective:  BP 110/68 (BP Location: Left Arm, Patient Position: Sitting, Cuff Size: Large)   Pulse 78   Temp (!) 97.3 F (36.3 C) (Temporal)   Ht 4' 10"  (1.473 m)   Wt 220 lb 4.8 oz (99.9 kg)   SpO2 98%   BMI 46.04 kg/m   Physical Exam Musculoskeletal:  Left shoulder: Normal.     Left upper arm: Normal.     Left hand: Normal.     Cervical back: Spasms and tenderness present. No rigidity, torticollis or crepitus. Pain with movement present. Decreased range of motion.   Assessment & Plan:  This visit occurred during the SARS-CoV-2 public health emergency.  Safety protocols were in place, including screening questions prior to the visit, additional usage of staff PPE, and extensive cleaning of exam room while observing appropriate contact time as indicated for disinfecting solutions.   Mackenzie Weber was seen today for neck pain.  Diagnoses and all orders for this visit:  Cervical paraspinous muscle  spasm -     cyclobenzaprine (FLEXERIL) 5 MG tablet; Take 1-2 tablets (5-10 mg total) by mouth at bedtime as needed for muscle spasms.   Problem List Items Addressed This Visit   None Visit Diagnoses     Cervical paraspinous muscle spasm    -  Primary   Relevant Medications   cyclobenzaprine (FLEXERIL) 5 MG tablet       Follow-up: Return if symptoms worsen or fail to improve.  Wilfred Lacy, NP

## 2021-05-08 DIAGNOSIS — J3089 Other allergic rhinitis: Secondary | ICD-10-CM

## 2021-05-08 NOTE — Progress Notes (Signed)
VIALS MADE. EXP 05-08-22

## 2021-05-09 DIAGNOSIS — J3081 Allergic rhinitis due to animal (cat) (dog) hair and dander: Secondary | ICD-10-CM

## 2021-05-11 ENCOUNTER — Ambulatory Visit (INDEPENDENT_AMBULATORY_CARE_PROVIDER_SITE_OTHER): Payer: BC Managed Care – PPO

## 2021-05-11 DIAGNOSIS — J309 Allergic rhinitis, unspecified: Secondary | ICD-10-CM | POA: Diagnosis not present

## 2021-05-17 ENCOUNTER — Ambulatory Visit (INDEPENDENT_AMBULATORY_CARE_PROVIDER_SITE_OTHER): Payer: BC Managed Care – PPO

## 2021-05-17 DIAGNOSIS — J309 Allergic rhinitis, unspecified: Secondary | ICD-10-CM | POA: Diagnosis not present

## 2021-05-30 ENCOUNTER — Ambulatory Visit (INDEPENDENT_AMBULATORY_CARE_PROVIDER_SITE_OTHER): Payer: BC Managed Care – PPO

## 2021-05-30 DIAGNOSIS — J309 Allergic rhinitis, unspecified: Secondary | ICD-10-CM

## 2021-06-05 ENCOUNTER — Ambulatory Visit (INDEPENDENT_AMBULATORY_CARE_PROVIDER_SITE_OTHER): Payer: BC Managed Care – PPO

## 2021-06-05 ENCOUNTER — Other Ambulatory Visit: Payer: Self-pay | Admitting: Nurse Practitioner

## 2021-06-05 DIAGNOSIS — E1165 Type 2 diabetes mellitus with hyperglycemia: Secondary | ICD-10-CM

## 2021-06-05 DIAGNOSIS — J309 Allergic rhinitis, unspecified: Secondary | ICD-10-CM | POA: Diagnosis not present

## 2021-06-07 NOTE — Telephone Encounter (Signed)
Chart supports rx refill Last ov: 05/05/2021 related to dx Last refill: 01/16/2021

## 2021-06-16 ENCOUNTER — Ambulatory Visit (INDEPENDENT_AMBULATORY_CARE_PROVIDER_SITE_OTHER): Payer: BC Managed Care – PPO | Admitting: *Deleted

## 2021-06-16 DIAGNOSIS — J309 Allergic rhinitis, unspecified: Secondary | ICD-10-CM | POA: Diagnosis not present

## 2021-06-26 ENCOUNTER — Ambulatory Visit (INDEPENDENT_AMBULATORY_CARE_PROVIDER_SITE_OTHER): Payer: BC Managed Care – PPO

## 2021-06-26 DIAGNOSIS — J309 Allergic rhinitis, unspecified: Secondary | ICD-10-CM | POA: Diagnosis not present

## 2021-07-06 ENCOUNTER — Ambulatory Visit (INDEPENDENT_AMBULATORY_CARE_PROVIDER_SITE_OTHER): Payer: BC Managed Care – PPO

## 2021-07-06 DIAGNOSIS — J309 Allergic rhinitis, unspecified: Secondary | ICD-10-CM | POA: Diagnosis not present

## 2021-07-11 ENCOUNTER — Ambulatory Visit (INDEPENDENT_AMBULATORY_CARE_PROVIDER_SITE_OTHER): Payer: BC Managed Care – PPO

## 2021-07-11 DIAGNOSIS — J309 Allergic rhinitis, unspecified: Secondary | ICD-10-CM | POA: Diagnosis not present

## 2021-07-20 ENCOUNTER — Ambulatory Visit (INDEPENDENT_AMBULATORY_CARE_PROVIDER_SITE_OTHER): Payer: BC Managed Care – PPO

## 2021-07-20 DIAGNOSIS — J309 Allergic rhinitis, unspecified: Secondary | ICD-10-CM

## 2021-07-28 ENCOUNTER — Ambulatory Visit (INDEPENDENT_AMBULATORY_CARE_PROVIDER_SITE_OTHER): Payer: BC Managed Care – PPO

## 2021-07-28 DIAGNOSIS — J309 Allergic rhinitis, unspecified: Secondary | ICD-10-CM

## 2021-08-02 ENCOUNTER — Ambulatory Visit (INDEPENDENT_AMBULATORY_CARE_PROVIDER_SITE_OTHER): Payer: BC Managed Care – PPO

## 2021-08-02 DIAGNOSIS — J309 Allergic rhinitis, unspecified: Secondary | ICD-10-CM | POA: Diagnosis not present

## 2021-08-08 ENCOUNTER — Other Ambulatory Visit: Payer: Self-pay

## 2021-08-08 ENCOUNTER — Ambulatory Visit (INDEPENDENT_AMBULATORY_CARE_PROVIDER_SITE_OTHER): Payer: BC Managed Care – PPO | Admitting: Allergy & Immunology

## 2021-08-08 VITALS — BP 124/78 | HR 85 | Temp 97.7°F | Resp 16 | Ht 60.0 in | Wt 226.2 lb

## 2021-08-08 DIAGNOSIS — K219 Gastro-esophageal reflux disease without esophagitis: Secondary | ICD-10-CM | POA: Diagnosis not present

## 2021-08-08 DIAGNOSIS — J302 Other seasonal allergic rhinitis: Secondary | ICD-10-CM

## 2021-08-08 DIAGNOSIS — J4540 Moderate persistent asthma, uncomplicated: Secondary | ICD-10-CM

## 2021-08-08 DIAGNOSIS — J4541 Moderate persistent asthma with (acute) exacerbation: Secondary | ICD-10-CM

## 2021-08-08 DIAGNOSIS — J3089 Other allergic rhinitis: Secondary | ICD-10-CM

## 2021-08-08 NOTE — Progress Notes (Signed)
FOLLOW UP  Date of Service/Encounter:  08/08/21   Assessment:   Moderate persistent asthma, uncomplicated - with improved control since using her medications as directed   Perennial and seasonal allergic rhinitis   GERD - on Nexium (followed by Dr. Lyndel Safe)  Plan/Recommendations:   1. Moderate persistent asthma without complication - Lung testing looks somewhat lower today, but it did improve with the albuterol treatment.  - We are starting you on a prednisone burst.  - Be sure to use your Breztri TWICE DAILY.  - Daily controller medication(s): Breztri two puffs TWICE daily with spacer. - Prior to physical activity: albuterol 2 puffs 10-15 minutes before physical activity. - Rescue medications: albuterol 4 puffs every 4-6 hours as needed - Changes during respiratory infections or worsening symptoms: Increase Breztri two puffs to 2 puffs twice daily for TWO WEEKS. - Asthma control goals:  * Full participation in all desired activities (may need albuterol before activity) * Albuterol use two time or less a week on average (not counting use with activity) * Cough interfering with sleep two time or less a month * Oral steroids no more than once a year * No hospitalizations  2. Perennial and seasonal allergic rhinitis - Continue with the nasal saline lavage. - Use the the azelastine one spray per nostril at least once daily AS NEEDED.  - Continue with Claritin in the morning and Singulair at night.  - Continue with allergy shots at the same schedule.   3. Gastroesophageal reflux disease - Continue with Nexium at least once daily.  4. Return in about 3 months (around 11/08/2021).   Subjective:   Mackenzie Weber is a 60 y.o. female presenting today for follow up of  Chief Complaint  Patient presents with   Follow-up    Patient in today she has rattle in her chest and cough for about a week. Today was the worse.    Mackenzie Weber has a history of the following: Patient  Active Problem List   Diagnosis Date Noted   Allergic conjunctivitis of both eyes 11/16/2020   Allergic conjunctivitis 05/30/2020   Moderate persistent asthma 05/30/2020   Grade I diastolic dysfunction 87/56/4332   Bilateral foot pain 01/13/2020   Extensor tendonitis of foot 01/13/2020   BMI 40.0-44.9, adult ([place]) 12/23/2019   Type 2 diabetes mellitus with hyperglycemia, without long-term current use of insulin ([place]) 01/01/2019   Seasonal and perennial allergic rhinitis 05/20/2018   Chronic bilateral low back pain with left-sided sciatica 05/20/2018   Lipoma of left upper extremity 01/29/2018   Muscle spasm of left lower extremity 01/29/2018   Intestinal ulcer 04/23/2017   Bilateral lower extremity edema 04/23/2017   Gastroesophageal reflux disease 03/28/2016   History of colon polyps 03/28/2016   Anemia 03/28/2016   Somatic dysfunction of cervical region 11/16/2015   Chronic sciatica 01/28/2015   Somatic dysfunction of pelvic region 01/28/2015   Chronic pelvic pain in female 01/28/2015   Somatic dysfunction of lower extremity 01/28/2015   Somatic dysfunction of sacral region 01/28/2015   Hypertensive disorder 10/22/2014   Morbid obesity due to excess calories ([place]) 10/22/2014   Abdominal muscle strain 06/07/2014   Sciatica of left side 11/03/2012    History obtained from: chart review and patient.  Mackenzie Weber is a 60 y.o. female presenting for a sick visit.  She was last seen in July 2022.  At that time, her lung testing looked fairly good.  We continue with Breztri 2 puffs twice daily with spacer  as well as albuterol as needed.  For her rhinitis, we continue with Astelin 1 spray per nostril daily at least once a day as well as Claritin in the morning and Singulair at night.  She was also continued on allergen immunotherapy.  For her GERD, she was continuing Nexium at least once daily.  Since the last visit, she has done well.   Asthma/Respiratory Symptom History: She has been having  some issues with congestion and coughing for 2 weeks. She has been having a lot of breathing issues for two weeks. She remains on her Breztri two puffs once daily.  She was last given prednisone through [place], although it was not me. She has not had a fever. She denies any COVID exposures. She did test early on during the period of illness. She has been using her rescue inhaler frequently during the day. She thought she was turning a corner, but symptoms acutely worsened over the weekend.   Allergic Rhinitis Symptom History: She has been on Claritin daily. She remains on the azelastine and Xhance.   She is on her shots and she thinks that they are helping. Compared to where she used to be, she is much better.   She did work at [employer]. She is thinking of going back to work there because she is rather bored with retirement. She also thinks that she will need some more spending money during her retirement years. 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   Otherwise, there have been no changes to her past medical history, surgical history, family history, or social history.    Review of Systems  Constitutional: Negative.  Negative for chills, fever, malaise/fatigue and weight loss.  HENT:  Positive for congestion and sinus pain. Negative for ear discharge and ear pain.   Eyes:  Negative for pain, discharge and redness.  Respiratory:  Positive for cough, shortness of breath and wheezing. Negative for sputum production.   Cardiovascular: Negative.  Negative for chest pain and palpitations.  Gastrointestinal:  Negative for abdominal pain, constipation, diarrhea, heartburn, nausea and vomiting.  Skin: Negative.  Negative for itching and rash.  Neurological:  Negative for dizziness and headaches.  Endo/Heme/Allergies:  Negative for environmental allergies. Does not bruise/bleed easily.      Objective:   Blood pressure 124/78, pulse 85, temperature 97.7 F (36.5 C), temperature  source Temporal, resp. rate 16, height 5' (1.524 m), weight 226 lb 3.2 oz (102.6 kg), SpO2 97 %. Body mass index is 44.18 kg/m.   Physical Exam:  Physical Exam Vitals reviewed.  Constitutional:      Appearance: She is well-developed.  HENT:     Head: Normocephalic and atraumatic.     Right Ear: Tympanic membrane, ear canal and external ear normal.     Left Ear: Tympanic membrane, ear canal and external ear normal.     Nose: No nasal deformity, septal deviation, mucosal edema or rhinorrhea.     Right Turbinates: Enlarged, swollen and pale.     Left Turbinates: Enlarged, swollen and pale.     Right Sinus: No maxillary sinus tenderness or frontal sinus tenderness.     Left Sinus: No maxillary sinus tenderness or frontal sinus tenderness.     Mouth/Throat:     Mouth: Mucous membranes are not pale and not dry.     Pharynx: Uvula midline.  Eyes:     General: Lids are normal. Allergic shiner present.        Right eye: No discharge.  Left eye: No discharge.     Conjunctiva/sclera: Conjunctivae normal.     Right eye: Right conjunctiva is not injected. No chemosis.    Left eye: Left conjunctiva is not injected. No chemosis.    Pupils: Pupils are equal, round, and reactive to light.  Cardiovascular:     Rate and Rhythm: Normal rate and regular rhythm.     Heart sounds: Normal heart sounds.  Pulmonary:     Effort: Respiratory distress and retractions present. No tachypnea or accessory muscle usage.     Breath sounds: Normal breath sounds. No wheezing, rhonchi or rales.     Comments: Decreased air movement at the bases. There is expiratory wheezing noted. No crackles noted.  Chest:     Chest wall: No tenderness.  Lymphadenopathy:     Cervical: No cervical adenopathy.  Skin:    General: Skin is warm.     Capillary Refill: Capillary refill takes less than 2 seconds.     Coloration: Skin is not pale.     Findings: No abrasion, erythema, petechiae or rash. Rash is not papular,  urticarial or vesicular.  Neurological:     Mental Status: She is alert.  Psychiatric:        Behavior: Behavior is cooperative.     Diagnostic studies:    Spirometry: results abnormal (FEV1: 0.97/51%, FVC: 1.36/58%, FEV1/FVC: 71%).    Spirometry consistent with possible restrictive disease. Xopenex six puffs via MDI treatment given in clinic with significant improvement in FEV1 and FVC per ATS criteria.  Allergy Studies: none       Salvatore Marvel, MD  [facility] and [facility]

## 2021-08-08 NOTE — Patient Instructions (Addendum)
1. Moderate persistent asthma without complication - Lung testing looks somewhat lower today, but it did improve with the albuterol treatment.  - We are starting you on a prednisone burst.  - Be sure to use your Breztri TWICE DAILY.  - Daily controller medication(s): Breztri two puffs TWICE daily with spacer. - Prior to physical activity: albuterol 2 puffs 10-15 minutes before physical activity. - Rescue medications: albuterol 4 puffs every 4-6 hours as needed - Changes during respiratory infections or worsening symptoms: Increase Breztri two puffs to 2 puffs twice daily for TWO WEEKS. - Asthma control goals:  * Full participation in all desired activities (may need albuterol before activity) * Albuterol use two time or less a week on average (not counting use with activity) * Cough interfering with sleep two time or less a month * Oral steroids no more than once a year * No hospitalizations  2. Perennial and seasonal allergic rhinitis - Continue with the nasal saline lavage. - Use the the azelastine one spray per nostril at least once daily AS NEEDED.  - Continue with Claritin in the morning and Singulair at night.  - Continue with allergy shots at the same schedule.   3. Gastroesophageal reflux disease - Continue with Nexium at least once daily.  4. Return in about 3 months (around 11/08/2021).    Please inform us of any Emergency Department visits, hospitalizations, or changes in symptoms. Call us before going to the ED for breathing or allergy symptoms since we might be able to fit you in for a sick visit. Feel free to contact us anytime with any questions, problems, or concerns.  It was a pleasure to meet you today!  Websites that have reliable patient information: 1. American Academy of Asthma, Allergy, and Immunology: www.aaaai.org 2. Food Allergy Research and Education (FARE): foodallergy.org 3. Mothers of Asthmatics: http://www.asthmacommunitynetwork.org 4. American College  of Allergy, Asthma, and Immunology: www.acaai.org   COVID-19 Vaccine Information can be found at: ShippingScam.co.uk For questions related to vaccine distribution or appointments, please email [email or web address] or call [phone].   We realize that you might be concerned about having an allergic reaction to the COVID19 vaccines. To help with that concern, WE ARE OFFERING THE COVID19 VACCINES IN OUR OFFICE! Ask the front desk for dates!     "Like" [place] on Facebook and Instagram for our latest updates!      A healthy democracy works best when [employer] participate! Make sure you are registered to vote! If you have moved or changed any of your contact information, you will need to get this updated before voting!  In some cases, you MAY be able to register to vote online: [email or web address]

## 2021-08-09 ENCOUNTER — Encounter: Payer: Self-pay | Admitting: Allergy & Immunology

## 2021-08-14 ENCOUNTER — Ambulatory Visit (INDEPENDENT_AMBULATORY_CARE_PROVIDER_SITE_OTHER): Payer: BC Managed Care – PPO

## 2021-08-14 DIAGNOSIS — J309 Allergic rhinitis, unspecified: Secondary | ICD-10-CM | POA: Diagnosis not present

## 2021-08-17 ENCOUNTER — Encounter: Payer: BC Managed Care – PPO | Admitting: Nurse Practitioner

## 2021-08-24 ENCOUNTER — Ambulatory Visit (INDEPENDENT_AMBULATORY_CARE_PROVIDER_SITE_OTHER): Payer: BC Managed Care – PPO

## 2021-08-24 DIAGNOSIS — J309 Allergic rhinitis, unspecified: Secondary | ICD-10-CM | POA: Diagnosis not present

## 2021-09-05 ENCOUNTER — Ambulatory Visit (INDEPENDENT_AMBULATORY_CARE_PROVIDER_SITE_OTHER): Payer: BC Managed Care – PPO

## 2021-09-05 DIAGNOSIS — J309 Allergic rhinitis, unspecified: Secondary | ICD-10-CM

## 2021-09-15 ENCOUNTER — Encounter: Payer: BC Managed Care – PPO | Admitting: Nurse Practitioner

## 2021-09-18 ENCOUNTER — Telehealth: Payer: Self-pay | Admitting: Allergy & Immunology

## 2021-09-18 NOTE — Telephone Encounter (Signed)
Pt request a call back, she has sinus issues that are keeping her up at night.

## 2021-09-18 NOTE — Telephone Encounter (Signed)
Patient is having sinus issues at night that is keeping her from sleeping through the night. She's really not sure if it's sinus or reflux. She has PND, and coughing up mucus that's yellow in color in the am and pm. During the day she is wheezing from her nasal passages. She has tried Claritin, then switched to Mucinex DM and now taking Allegra 180 mg. She has an appointment with GI on Friday. Please advise she has been having these issues x 3 weeks.

## 2021-09-19 MED ORDER — IPRATROPIUM BROMIDE 0.03 % NA SOLN: 2.0000 | Freq: Three times a day (TID) | NASAL | 3 refills | Status: DC

## 2021-09-19 NOTE — Telephone Encounter (Signed)
Confirmed pharmacy with pt and sent in atrovent and informed pt of the drying she stated understanding

## 2021-09-19 NOTE — Telephone Encounter (Signed)
I pended nasal atrovent to help with postnasal drip. It can be overdrying, so beware of that. Please confirm a pharmacy and sign on my behalf.  Salvatore Marvel, MD [facility] [place]

## 2021-09-20 DIAGNOSIS — J3089 Other allergic rhinitis: Secondary | ICD-10-CM

## 2021-09-20 NOTE — Progress Notes (Signed)
VIALS MADE. EXP 09-20-22

## 2021-09-21 ENCOUNTER — Ambulatory Visit (INDEPENDENT_AMBULATORY_CARE_PROVIDER_SITE_OTHER): Payer: BC Managed Care – PPO

## 2021-09-21 DIAGNOSIS — J309 Allergic rhinitis, unspecified: Secondary | ICD-10-CM | POA: Diagnosis not present

## 2021-09-22 ENCOUNTER — Ambulatory Visit (INDEPENDENT_AMBULATORY_CARE_PROVIDER_SITE_OTHER): Payer: BC Managed Care – PPO | Admitting: Gastroenterology

## 2021-09-22 ENCOUNTER — Other Ambulatory Visit: Payer: Self-pay

## 2021-09-22 ENCOUNTER — Encounter: Payer: Self-pay | Admitting: Gastroenterology

## 2021-09-22 ENCOUNTER — Other Ambulatory Visit (INDEPENDENT_AMBULATORY_CARE_PROVIDER_SITE_OTHER): Payer: BC Managed Care – PPO

## 2021-09-22 VITALS — BP 124/82 | HR 77 | Ht 60.0 in | Wt 226.0 lb

## 2021-09-22 DIAGNOSIS — R140 Abdominal distension (gaseous): Secondary | ICD-10-CM

## 2021-09-22 DIAGNOSIS — K2270 Barrett's esophagus without dysplasia: Secondary | ICD-10-CM

## 2021-09-22 DIAGNOSIS — K219 Gastro-esophageal reflux disease without esophagitis: Secondary | ICD-10-CM

## 2021-09-22 DIAGNOSIS — R1013 Epigastric pain: Secondary | ICD-10-CM | POA: Diagnosis not present

## 2021-09-22 DIAGNOSIS — D509 Iron deficiency anemia, unspecified: Secondary | ICD-10-CM

## 2021-09-22 DIAGNOSIS — K449 Diaphragmatic hernia without obstruction or gangrene: Secondary | ICD-10-CM

## 2021-09-22 DIAGNOSIS — R0602 Shortness of breath: Secondary | ICD-10-CM

## 2021-09-22 DIAGNOSIS — R1030 Lower abdominal pain, unspecified: Secondary | ICD-10-CM

## 2021-09-22 MED ORDER — FAMOTIDINE 20 MG PO TABS: 20.0000 mg | ORAL_TABLET | Freq: Every day | ORAL | 4 refills | Status: DC

## 2021-09-22 MED ORDER — PANTOPRAZOLE SODIUM 40 MG PO TBEC: 40.0000 mg | DELAYED_RELEASE_TABLET | Freq: Every day | ORAL | 3 refills | Status: DC

## 2021-09-22 MED ORDER — PANTOPRAZOLE SODIUM 40 MG PO TBEC: 40.0000 mg | DELAYED_RELEASE_TABLET | Freq: Two times a day (BID) | ORAL | 3 refills | Status: DC

## 2021-09-22 NOTE — Patient Instructions (Addendum)
If you are age 59 or older, your body mass index should be between 23-30. Your Body mass index is 44.14 kg/m. If this is out of the aforementioned range listed, please consider follow up with your Primary Care Provider.  If you are age 88 or younger, your body mass index should be between 19-25. Your Body mass index is 44.14 kg/m. If this is out of the aformentioned range listed, please consider follow up with your Primary Care Provider.   ________________________________________________________  The [facility] providers would like to encourage you to use [facility] to communicate with providers for non-urgent requests or questions.  Due to long hold times on the telephone, sending your provider a message by [facility] may be a faster and more efficient way to get a response.  Please allow 48 business hours for a response.  Please remember that this is for non-urgent requests.  _______________________________________________________  Please go to the lab on the 2nd floor [address] before you leave the office today.   Please try to lose 6 pounds over the next 3 months  We have sent the following medications to your pharmacy for you to pick up at your convenience: Protonix  Please purchase the following medications over the counter and take as directed: Miralax 17g as needed in [place] of water Metamucil  Please call in 3 business days to schedule CT scan You have been scheduled for a CT scan of the abdomen and pelvis at [facility] ([place], [place] [address] 1st floor Radiology).   You are scheduled on             at              . You should arrive 15 minutes prior to your appointment time for registration. Please follow the written instructions below on the day of your exam:  WARNING: IF YOU ARE ALLERGIC TO IODINE/X-RAY DYE, PLEASE NOTIFY RADIOLOGY IMMEDIATELY AT [phone]! YOU WILL BE GIVEN A 13 HOUR PREMEDICATION PREP.  1) Do not eat or drink anything after           (4 hours prior to your test) 2) You have been given 2 bottles of oral contrast to drink. The solution may taste better if refrigerated, but do NOT add ice or any other liquid to this solution. Shake well before drinking.    Drink 1 bottle of contrast @          (2 hours prior to your exam)  Drink 1 bottle of contrast @           (1 hour prior to your exam)  You may take any medications as prescribed with a small amount of water, if necessary. If you take any of the following medications: METFORMIN, GLUCOPHAGE, GLUCOVANCE, AVANDAMET, RIOMET, FORTAMET, Pax MET, JANUMET, GLUMETZA or METAGLIP, you MAY be asked to HOLD this medication 48 hours AFTER the exam.  The purpose of you drinking the oral contrast is to aid in the visualization of your intestinal tract. The contrast solution may cause some diarrhea. Depending on your individual set of symptoms, you may also receive an intravenous injection of x-ray contrast/dye. Plan on being at [facility] for 30 minutes or longer, depending on the type of exam you are having performed.  This test typically takes 30-45 minutes to complete.  If you have any questions regarding your exam or if you need to reschedule, you may call the CT department at [phone] between the  hours of 8:00 am and 5:00 pm, Monday-Friday.  ________________________________________________________________________   Thank you,  Dr. Jackquline Denmark

## 2021-09-22 NOTE — Progress Notes (Signed)
Chief Complaint: GI eval  Referring Provider:  Flossie Buffy, NP      ASSESSMENT AND PLAN;   #1. GERD with small HH with Barrett's on GE Jn Bx. Next EGD due 03/2024  #2. Epi pain with SOB. S/P chole in past. Neg EGD, Nl CBC, CMP  #3. LLQ pain with tenderness.   #4. IBS-C. Neg colon 06/2019, rpt colon due 06/2024 d/t H/O adenomatous polyps.  #4. Longstanding microcytic anemia with heme neg stools. ?Hemoglobinopathy. Neg colon 06/2019   Plan: -CT AP with contrast -CBC, CMP, lipase -Change Nexium to protonix 25m po bid #90, 4 refiils. Add pepcid 222mpo QHS  -Metamucil to continue  -Continue Miralax prn -If still with UGI s/s, recommend ENT (r/o sinus problems), then eso manometry followed by TIFS -Encouraged her to lose weight.  Nonpharmacologic means of reflux control was stressed.  [facility]   HPI:    Mackenzie Weber a [age].o. female  GERD, DM2, obesity, SB adhesions, S/P cholecystectomy, TAH with BSO  With multiple GI problems  -Continues to have heartburn with postprandial abdominal bloating.  She stopped taking Nexium on her own.  This did result in worsening of symptoms.  Also has shortness of breath after eating.  She has tried Protonix in past.  Most recent EGD as below showed small hiatal hernia, short segment Barrett's esophagus.  She denies having any odynophagia or dysphagia.  Does have postnasal drip and sinus problems.  [facility]  No nausea or vomiting. H/O heartburn x 20 years.  -Longstanding history of constipation with pellet-like stools, negative colonoscopy 06/2019 as below.  It was recommended to repeat colonoscopy in 5 years due to previous history of tubular adenomas.  Now with more lower abdominal pain especially LLQ.  She denies having any fever but has occasional chills.  The abdominal pain is getting worse.  She does take Metamucil every  day.  -Has gained more weight.  Not sure if she has sleep apnea but she does snore.  Family history of colon cancer in a distant second-degree relative.  She always had low hemoglobin with significant microcytosis.  In 2013 her hemoglobin was 10.7 with MCV 66.  Most recently her hemoglobin was 11 with MCV 67.  She had heme-negative stools.   Having sinus problems.  Wt Readings from Last 3 Encounters:  09/22/21 226 lb (102.5 kg)  08/08/21 226 lb 3.2 oz (102.6 kg)  05/05/21 220 lb 4.8 oz (99.9 kg)       Past GI work-up:  Colonoscopy 06/24/2019 (Dr RhDorrene German-Good prep -Small internal hemorrhoids -Otherwise normal colonoscopy. -Repeat in 5 years due to previous H/O adenomatous polyps.  EGD 04/12/2021 - Z-line minimally irregular, 35 cm from the incisors. Biopsied. Rpt 3 yrs - Small transient hiatal hernia. 1. Surgical [P], duodenal biopsies - DUODENAL MUCOSA WITH NORMAL VILLOUS ARCHITECTURE. - NO VILLOUS ATROPHY OR INCREASED INTRAEPITHELIAL LYMPHOCYTES. 2. Surgical [P], gastric biopsies - UNREMARKABLE ANTRAL AND OXYNTIC MUCOSA. - Hinton DyerEGATIVE FOR HELICOBACTER PYLORI. - NO INTESTINAL METAPLASIA, DYSPLASIA OR CARCINOMA. 3. Surgical [P], distal esophagus - GASTROESOPHAGEAL MUCOSA WITH INTESTINAL METAPLASIA CONSISTENT WITH BARRETT'S ESOPHAGUS. - NO DYSPLASIA OR CARCINOMA.  EGD 05/12/2018 -Small HH -GE junction biopsies with intestinal metaplasia consistent with Barrett's. 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  No dysplasia. -Repeat EGD in 3 years.  CT AP with contrast  01/2011 1.  No acute abnormality on CT of the abdomen and pelvis.  2.  No renal or ureteral calculi.  3.  The terminal ileum is well seen and appears normal.  The  patient has previously undergone appendectomy and hysterectomy.  No  adnexal lesion is seen.  4.  Probable left parapelvic cyst along the inferior pelvis but the  attenuation is slightly higher  than would be expected.  Consider  renal ultrasound to assess further.  Op note 10/1996: L salpingo-oophorectomy with partial SB resection d/t extensive adhesions, and appendicectomy.     Past Medical History:  Diagnosis Date   Anemia    Asthma    Diabetes mellitus without complication (HCC)    Foot pain, right    Hypertension    IBS (irritable bowel syndrome)    Sciatica of left side    Seasonal allergies     Past Surgical History:  Procedure Laterality Date   ABDOMINAL HYSTERECTOMY     APPENDECTOMY     BLADDER SUSPENSION  2012   TVT   BREAST SURGERY  2001   /biopsy-benign   CHOLECYSTECTOMY     COLONOSCOPY  06/24/2019   [facility]   ESOPHAGOGASTRODUODENOSCOPY  05/12/2018   [facility]    EXCISIONAL HEMORRHOIDECTOMY     KNEE SURGERY     right   SHOULDER SURGERY Left 02/13/2018   Lipoma removed    Small Bowel Surgery     TONSILLECTOMY     TOTAL ABDOMINAL HYSTERECTOMY W/ BILATERAL SALPINGOOPHORECTOMY  1989   LSO-1987; RsO-1998   WRIST SURGERY     carpal tunnel repair    Family History  Problem Relation Age of Onset   Diabetes Mother    Hypertension Mother    Asthma Mother    Heart disease Mother    Allergic rhinitis Mother    Diabetes Father    Hypertension Father    Heart disease Father    Hypertension Sister    Diabetes Sister    Cancer Sister 62       breast   Diabetes Brother    Hypertension Paternal Uncle    Colon cancer Maternal Uncle    Pancreatic cancer Neg Hx    Stomach cancer Neg Hx    Esophageal cancer Neg Hx    Liver disease Neg Hx     Social History   Tobacco Use   Smoking status: Never   Smokeless tobacco: Never  Vaping Use   Vaping Use: Never used  Substance Use Topics   Alcohol use: No   Drug use: No    Current Outpatient Medications  Medication Sig Dispense Refill   azelastine (ASTELIN) 0.1 % nasal spray 1-2 sprays  each nostril twice daily as needed 90 mL 1   blood glucose meter kit and supplies KIT Check blood  sugar once daily. One Touch Verio Reflect Meter 1 each 0   Budeson-Glycopyrrol-Formoterol (BREZTRI AEROSPHERE) 160-9-4.8 MCG/ACT AERO Inhale 2 puffs into the lungs daily. Use with spacer. Rinse, gargle and spit out after use. 10.7 g 5   EPINEPHrine (AUVI-Q) 0.3 mg/0.3 mL IJ SOAJ injection Inject 0.3 mg into the muscle as needed for anaphylaxis. 2 each 1   esomeprazole (NEXIUM) 20 MG capsule Take 1 capsule (20 mg total) by mouth 2 (two) times daily. 1 capsule 30 minutes before Breakfast Daily 60 capsule 11   estradiol (ESTRACE) 0.1 MG/GM vaginal cream Place vaginally.     Fluticasone Propionate (XHANCE) 93 MCG/ACT EXHU Place 2 sprays  into the nose 2 (two) times daily as needed. 32 mL 1   glucose blood (ONETOUCH VERIO) test strip USE TO TEST BLOOD SUGAR ONCE DAILY 100 strip 3   ipratropium (ATROVENT) 0.03 % nasal spray Place 2 sprays into both nostrils 3 (three) times daily. 30 mL 3   Lancets (ONETOUCH DELICA PLUS [id number]) MISC USE TO TEST BLOOD SUGAR ONCE DAILY 100 each 3   losartan (COZAAR) 100 MG tablet Take 1 tablet (100 mg total) by mouth daily. 90 tablet 3   metFORMIN (GLUCOPHAGE-XR) 500 MG 24 hr tablet TAKE 1 TABLET(500 MG) BY MOUTH DAILY WITH BREAKFAST 90 tablet 1   montelukast (SINGULAIR) 10 MG tablet Take 1 tablet (10 mg total) by mouth at bedtime. FOR SEASONAL ALLERGY (Patient taking differently: Take 10 mg by mouth as needed. FOR SEASONAL ALLERGY) 30 tablet 5   Multiple Vitamin (MULTIVITAMIN) capsule Take 1 capsule by mouth daily.     NON FORMULARY 2 allergy injections every week     Olopatadine HCl (PATADAY) 0.2 % SOLN Place 1 drop into both eyes daily as needed. 2.5 mL 5   PROAIR HFA 108 (90 Base) MCG/ACT inhaler INHALE 2 PUFFS BY MOUTH EVERY 4 HOURS AS NEEDED FOR WHEEZING FOR SHORTNESS OF BREATH 18 g 1   triamterene-hydrochlorothiazide (MAXZIDE) 75-50 MG tablet Take 0.5 tablets by mouth daily. 45 tablet 1   No current facility-administered medications for this visit.    Allergies   Allergen Reactions   Cortisone     Red area around injection site x 42mh   Depo-Medrol [Methylprednisolone Sodium Succ] Nausea Only and Other (See Comments)    Dizziness   Aspirin Anxiety and Other (See Comments)    Rapid Heart beat    Review of Systems:  neg     Physical Exam:    BP 124/82 (BP Location: Right Arm, Patient Position: Sitting, Cuff Size: Normal)   Pulse 77   Ht 5' (1.524 m)   Wt 226 lb (102.5 kg)   SpO2 99%   BMI 44.14 kg/m  Wt Readings from Last 3 Encounters:  09/22/21 226 lb (102.5 kg)  08/08/21 226 lb 3.2 oz (102.6 kg)  05/05/21 220 lb 4.8 oz (99.9 kg)   Constitutional:  Well-developed, in no acute distress. Psychiatric: Normal mood and affect. Behavior is normal. HEENT: Pupils normal.  Conjunctivae are normal. No scleral icterus. Neck supple.  Cardiovascular: Normal rate, regular rhythm. No edema Pulmonary/chest: Effort normal and breath sounds normal. No wheezing, rales or rhonchi. Abdominal: Soft, nondistended. LLQ tenderness without rebound. Bowel sounds active throughout. There are no masses palpable. No hepatomegaly. Rectal: Deferred Neurological: Alert and oriented to person place and time. Skin: Skin is warm and dry. No rashes noted.  Data Reviewed: I have personally reviewed following labs and imaging studies  CBC: CBC Latest Ref Rng & Units 08/11/2020 03/18/2018 04/13/2016  WBC 4.0 - 10.5 K/uL 5.6 6.7 5.9  Hemoglobin 12.0 - 15.0 g/dL 11.0(L) 11.6(L) 10.9(L)  Hematocrit 36.0 - 46.0 % 35.0(L) 38.0 34.4(L)  Platelets 150.0 - 400.0 K/uL 290.0 340 268    CMP: CMP Latest Ref Rng & Units 02/14/2021 12/13/2020 08/11/2020  Glucose 70 - 99 mg/dL 93 124(H) 97  BUN 6 - 23 mg/dL 19 17 13   Creatinine 0.40 - 1.20 mg/dL 0.97 1.10(H) 0.97  Sodium 135 - 145 mEq/L 139 140 138  Potassium 3.5 - 5.1 mEq/L 3.8 3.4(L) 3.3(L)  Chloride 96 - 112 mEq/L 103 103 102  CO2 19 - 32 mEq/L 28 22  29  Calcium 8.4 - 10.5 mg/dL 9.9 9.6 9.8  Total Protein 6.0 - 8.3 g/dL - -  7.3  Total Bilirubin 0.2 - 1.2 mg/dL - - 0.3  Alkaline Phos 39 - 117 U/L - - 55  AST 0 - 37 U/L - - 16  ALT 0 - 35 U/L - - 10        Carmell Austria, MD 09/22/2021, 3:14 PM  Cc: Flossie Buffy, NP

## 2021-09-23 LAB — CBC WITH DIFFERENTIAL/PLATELET
Basophils Absolute: 0 10*3/uL (ref 0.0–0.2)
Basos: 1 %
EOS (ABSOLUTE): 0.1 10*3/uL (ref 0.0–0.4)
Eos: 2 %
Hematocrit: 38.4 % (ref 34.0–46.6)
Hemoglobin: 11.4 g/dL (ref 11.1–15.9)
Immature Grans (Abs): 0 10*3/uL (ref 0.0–0.1)
Immature Granulocytes: 0 %
Lymphocytes Absolute: 2.6 10*3/uL (ref 0.7–3.1)
Lymphs: 38 %
MCH: 20.5 pg — ABNORMAL LOW (ref 26.6–33.0)
MCHC: 29.7 g/dL — ABNORMAL LOW (ref 31.5–35.7)
MCV: 69 fL — ABNORMAL LOW (ref 79–97)
Monocytes Absolute: 0.5 10*3/uL (ref 0.1–0.9)
Monocytes: 7 %
Neutrophils Absolute: 3.6 10*3/uL (ref 1.4–7.0)
Neutrophils: 52 %
Platelets: 361 10*3/uL (ref 150–450)
RBC: 5.55 x10E6/uL — ABNORMAL HIGH (ref 3.77–5.28)
RDW: 16.2 % — ABNORMAL HIGH (ref 11.7–15.4)
WBC: 6.9 10*3/uL (ref 3.4–10.8)

## 2021-09-23 LAB — COMPREHENSIVE METABOLIC PANEL
ALT: 13 IU/L (ref 0–32)
AST: 15 IU/L (ref 0–40)
Albumin/Globulin Ratio: 1.6 (ref 1.2–2.2)
Albumin: 4.5 g/dL (ref 3.8–4.9)
Alkaline Phosphatase: 75 IU/L (ref 44–121)
BUN/Creatinine Ratio: 18 (ref 9–23)
BUN: 18 mg/dL (ref 6–24)
Bilirubin Total: <0.2 mg/dL (ref 0.0–1.2)
CO2: 27 mmol/L (ref 20–29)
Calcium: 10.2 mg/dL (ref 8.7–10.2)
Chloride: 101 mmol/L (ref 96–106)
Creatinine, Ser: 1 mg/dL (ref 0.57–1.00)
Globulin, Total: 2.9 g/dL (ref 1.5–4.5)
Glucose: 147 mg/dL — ABNORMAL HIGH (ref 70–99)
Potassium: 3.4 mmol/L — ABNORMAL LOW (ref 3.5–5.2)
Sodium: 141 mmol/L (ref 134–144)
Total Protein: 7.4 g/dL (ref 6.0–8.5)
eGFR: 65 mL/min/{1.73_m2} (ref >59)

## 2021-09-23 LAB — LIPASE: Lipase: 17 U/L (ref 14–72)

## 2021-09-25 ENCOUNTER — Other Ambulatory Visit: Payer: Self-pay

## 2021-09-25 DIAGNOSIS — E876 Hypokalemia: Secondary | ICD-10-CM

## 2021-09-25 DIAGNOSIS — R1013 Epigastric pain: Secondary | ICD-10-CM

## 2021-09-25 MED ORDER — POTASSIUM CHLORIDE CRYS ER 20 MEQ PO TBCR: 20.0000 meq | EXTENDED_RELEASE_TABLET | Freq: Every day | ORAL | 0 refills | Status: DC

## 2021-09-28 ENCOUNTER — Encounter (HOSPITAL_BASED_OUTPATIENT_CLINIC_OR_DEPARTMENT_OTHER): Payer: Self-pay

## 2021-09-28 ENCOUNTER — Other Ambulatory Visit: Payer: Self-pay

## 2021-09-28 ENCOUNTER — Ambulatory Visit (HOSPITAL_BASED_OUTPATIENT_CLINIC_OR_DEPARTMENT_OTHER)
Admission: RE | Admit: 2021-09-28 | Discharge: 2021-09-28 | Disposition: A | Discharge status: Home / Self Care | Payer: BC Managed Care – PPO | Source: Ambulatory Visit | Attending: Gastroenterology | Admitting: Gastroenterology

## 2021-09-28 DIAGNOSIS — R0602 Shortness of breath: Secondary | ICD-10-CM | POA: Diagnosis present

## 2021-09-28 DIAGNOSIS — R140 Abdominal distension (gaseous): Secondary | ICD-10-CM | POA: Diagnosis present

## 2021-09-28 DIAGNOSIS — R1030 Lower abdominal pain, unspecified: Secondary | ICD-10-CM | POA: Diagnosis present

## 2021-09-28 DIAGNOSIS — D509 Iron deficiency anemia, unspecified: Secondary | ICD-10-CM

## 2021-09-28 DIAGNOSIS — K219 Gastro-esophageal reflux disease without esophagitis: Secondary | ICD-10-CM | POA: Insufficient documentation

## 2021-09-28 DIAGNOSIS — K449 Diaphragmatic hernia without obstruction or gangrene: Secondary | ICD-10-CM | POA: Diagnosis not present

## 2021-09-28 DIAGNOSIS — R1013 Epigastric pain: Secondary | ICD-10-CM

## 2021-09-28 DIAGNOSIS — K2270 Barrett's esophagus without dysplasia: Secondary | ICD-10-CM

## 2021-09-28 MED ORDER — IOHEXOL 300 MG/ML  SOLN
100.0000 mL | Freq: Once | INTRAMUSCULAR | Status: AC | PRN
  Administered 2021-09-28: 100 mL via INTRAVENOUS

## 2021-09-29 ENCOUNTER — Ambulatory Visit: Payer: Self-pay

## 2021-09-29 DIAGNOSIS — J309 Allergic rhinitis, unspecified: Secondary | ICD-10-CM

## 2021-10-02 ENCOUNTER — Ambulatory Visit (INDEPENDENT_AMBULATORY_CARE_PROVIDER_SITE_OTHER): Payer: BC Managed Care – PPO | Admitting: Family Medicine

## 2021-10-02 ENCOUNTER — Encounter: Payer: BC Managed Care – PPO | Admitting: Nurse Practitioner

## 2021-10-02 ENCOUNTER — Other Ambulatory Visit: Payer: Self-pay

## 2021-10-02 ENCOUNTER — Telehealth: Payer: Self-pay

## 2021-10-02 ENCOUNTER — Encounter: Payer: Self-pay | Admitting: Family Medicine

## 2021-10-02 DIAGNOSIS — J3089 Other allergic rhinitis: Secondary | ICD-10-CM | POA: Diagnosis not present

## 2021-10-02 DIAGNOSIS — J0110 Acute frontal sinusitis, unspecified: Secondary | ICD-10-CM

## 2021-10-02 DIAGNOSIS — J4541 Moderate persistent asthma with (acute) exacerbation: Secondary | ICD-10-CM | POA: Diagnosis not present

## 2021-10-02 DIAGNOSIS — J302 Other seasonal allergic rhinitis: Secondary | ICD-10-CM

## 2021-10-02 DIAGNOSIS — K219 Gastro-esophageal reflux disease without esophagitis: Secondary | ICD-10-CM | POA: Diagnosis not present

## 2021-10-02 MED ORDER — EPINEPHRINE 0.3 MG/0.3ML IJ SOAJ: 0.3000 mg | Freq: Once | INTRAMUSCULAR | 1 refills | Status: AC

## 2021-10-02 MED ORDER — OLOPATADINE HCL 0.2 % OP SOLN: 1.0000 [drp] | Freq: Every day | OPHTHALMIC | 5 refills | Status: DC | PRN

## 2021-10-02 MED ORDER — AMOXICILLIN-POT CLAVULANATE 875-125 MG PO TABS: 1.0000 | ORAL_TABLET | Freq: Two times a day (BID) | ORAL | 0 refills | Status: AC

## 2021-10-02 MED ORDER — PREDNISONE 10 MG PO TABS: ORAL_TABLET | ORAL | 0 refills | Status: DC

## 2021-10-02 NOTE — Telephone Encounter (Signed)
Patient calling to let us know that her sinus pressure isn't getting any better. She is now having sinus pressure,forehead, temple area with yellowish sinus drainage with hoarseness. No fever. Patient called in on December 5th and Dr. Ernst Bowler sent in atrovent in, but she is still having sinus drainage. She was exposed to a dusty christmas tree which made her sinuses and hoarseness worse. Please advise

## 2021-10-02 NOTE — Patient Instructions (Addendum)
Asthma Increase Breztri to 2 puffs twice a day with a spacer to prevent cough or wheeze Begin prednisone 10 mg tablets. Take 2 tablets once a day for 4 days, then take 1 tablet on the 5th day, then stop.  Continue to check your blood sugars while taking prednisone Continue albuterol 2 puffs once every 4 hours as needed for cough or wheeze You may use albuterol 2 puffs 5 to 15 minutes before activity to decrease cough or wheeze Move forward with chest x-ray if no improvement in coughing with prednisone Consider lab testing including CBC with differential and IgE for evaluation of biologic medication candidacy  Acute sinusitis Begin Augmentin 875 mg twice a day for 10 days Continue nasal saline rinses and steroid nasal sprays Stop antihistamines for the next 5 days if you can tolerate this Begin Mucinex 600 to 1200 mg twice a day and increase fluid intake to thin mucus.  This will replace Mucinex DM which can increase your blood pressure  Allergic rhinitis Continue allergen avoidance measures directed toward pollens, pets, mold, cockroach, and dust mite as listed below Continue allergen immunotherapy once you are allergy symptoms have returned to baseline Have access to an epinephrine autoinjector set while continuing on allergen immunotherapy Stop Allegra 180 mg for the next 5 days.  Then, continue Allegra 180 mg once a day as needed for runny nose or itch.  Increase Allegra to 1 tablet in the morning and 1 tablet in the evening on the day before and the day of your allergy injection.  This may decrease your local arm itching at the injection site Continue azelastine 2 sprays in each nostril twice a day as needed for runny nose Continue Atrovent 2 sprays in each nostril twice a day as needed for runny nose Continue Xhance 2 sprays in each nostril twice a day as needed for nasal symptoms Consider saline nasal rinses as needed for nasal symptoms. Use this before any medicated nasal sprays for best  result  Reflux Continue dietary and lifestyle modifications as listed below Continue to follow the treatment plan as listed by your gastrointestinal specialist Continue to follow-up with your gastrointestinal specialist for evaluation and treatment  Call the clinic your symptoms worsen or you develop a fever.  Follow up in 2 months or sooner if needed.   Lifestyle Changes for Controlling GERD When you have GERD, stomach acid feels as if its backing up toward your mouth. Whether or not you take medication to control your GERD, your symptoms can often be improved with lifestyle changes.   Raise Your Head Reflux is more likely to strike when youre lying down flat, because stomach fluid can flow backward more easily. Raising the head of your bed 4-6 inches can help. To do this: Slide blocks or books under the legs at the head of your bed. Or, place a wedge under the mattress. Many foam stores can make a suitable wedge for you. The wedge should run from your waist to the top of your head. Dont just prop your head on several pillows. This increases pressure on your stomach. It can make GERD worse.  Watch Your Eating Habits Certain foods may increase the acid in your stomach or relax the lower esophageal sphincter, making GERD more likely. Its best to avoid the following: Coffee, tea, and carbonated drinks (with and without caffeine) Fatty, fried, or spicy food Mint, chocolate, onions, and tomatoes Any other foods that seem to irritate your stomach or cause you pain  Relieve  the Pressure Eat smaller meals, even if you have to eat more often. Dont lie down right after you eat. Wait a few hours for your stomach to empty. Avoid tight belts and tight-fitting clothes. Lose excess weight.  Tobacco and Alcohol Avoid smoking tobacco and drinking alcohol. They can make GERD symptoms worse.  Reducing Pollen Exposure The American Academy of Allergy, Asthma and Immunology suggests the  following steps to reduce your exposure to pollen during allergy seasons. Do not hang sheets or clothing out to dry; pollen may collect on these items. Do not mow lawns or spend time around freshly cut grass; mowing stirs up pollen. Keep windows closed at night.  Keep car windows closed while driving. Minimize morning activities outdoors, a time when pollen counts are usually at their highest. Stay indoors as much as possible when pollen counts or humidity is high and on windy days when pollen tends to remain in the air longer. Use air conditioning when possible.  Many air conditioners have filters that trap the pollen spores. Use a HEPA room air filter to remove pollen form the indoor air you breathe.  Control of Dog or Cat Allergen Avoidance is the best way to manage a dog or cat allergy. If you have a dog or cat and are allergic to dog or cats, consider removing the dog or cat from the home. If you have a dog or cat but dont want to find it a new home, or if your family wants a pet even though someone in the household is allergic, here are some strategies that may help keep symptoms at bay:  Keep the pet out of your bedroom and restrict it to only a few rooms. Be advised that keeping the dog or cat in only one room will not limit the allergens to that room. Dont pet, hug or kiss the dog or cat; if you do, wash your hands with soap and water. High-efficiency particulate air (HEPA) cleaners run continuously in a bedroom or living room can reduce allergen levels over time. Regular use of a high-efficiency vacuum cleaner or a central vacuum can reduce allergen levels. Giving your dog or cat a bath at least once a week can reduce airborne allergen.  Control of Mold Allergen Mold and fungi can grow on a variety of surfaces provided certain temperature and moisture conditions exist.  Outdoor molds grow on plants, decaying vegetation and soil.  The major outdoor mold, Alternaria and Cladosporium,  are found in very high numbers during hot and dry conditions.  Generally, a late Summer - Fall peak is seen for common outdoor fungal spores.  Rain will temporarily lower outdoor mold spore count, but counts rise rapidly when the rainy period ends.  The most important indoor molds are Aspergillus and Penicillium.  Dark, humid and poorly ventilated basements are ideal sites for mold growth.  The next most common sites of mold growth are the bathroom and the kitchen.  Outdoor [employer] Use air conditioning and keep windows closed Avoid exposure to decaying vegetation. Avoid leaf raking. Avoid grain handling. Consider wearing a face mask if working in moldy areas.  Indoor Mold Control Maintain humidity below 50%. Clean washable surfaces with 5% bleach solution. Remove sources e.g. Contaminated carpets.   Control of Dust Mite Allergen Dust mites play a major role in allergic asthma and rhinitis. They occur in environments with high humidity wherever human skin is found. Dust mites absorb humidity from the atmosphere (ie, they do not drink) and  feed on organic matter (including shed human and animal skin). Dust mites are a microscopic type of insect that you cannot see with the naked eye. High levels of dust mites have been detected from mattresses, pillows, carpets, upholstered furniture, bed covers, clothes, soft toys and any woven material. The principal allergen of the dust mite is found in its feces. A gram of dust may contain 1,000 mites and 250,000 fecal particles. Mite antigen is easily measured in the air during house cleaning activities. Dust mites do not bite and do not cause harm to humans, other than by triggering allergies/asthma.  Ways to decrease your exposure to dust mites in your home:  1. Encase mattresses, box springs and pillows with a mite-impermeable barrier or cover  2. Wash sheets, blankets and drapes weekly in hot water (130 F) with detergent and dry them in a dryer on  the hot setting.  3. Have the room cleaned frequently with a vacuum cleaner and a damp dust-mop. For carpeting or rugs, vacuuming with a vacuum cleaner equipped with a high-efficiency particulate air (HEPA) filter. The dust mite allergic individual should not be in a room which is being cleaned and should wait 1 hour after cleaning before going into the room.  4. Do not sleep on upholstered furniture (eg, couches).  5. If possible removing carpeting, upholstered furniture and drapery from the home is ideal. Horizontal blinds should be eliminated in the rooms where the person spends the most time (bedroom, study, television room). Washable vinyl, roller-type shades are optimal.  6. Remove all non-washable stuffed toys from the bedroom. Wash stuffed toys weekly like sheets and blankets above.  7. Reduce indoor humidity to less than 50%. Inexpensive humidity monitors can be purchased at most hardware stores. Do not use a humidifier as can make the problem worse and are not recommended.  Control of Cockroach Allergen Cockroach allergen has been identified as an important cause of acute attacks of asthma, especially in urban settings.  There are fifty-five species of cockroach that exist in the [place], however only three, the [place], [place] species produce allergen that can affect patients with Asthma.  Allergens can be obtained from fecal particles, egg casings and secretions from cockroaches.    Remove food sources. Reduce access to water. Seal access and entry points. Spray runways with 0.5-1% Diazinon or Chlorpyrifos Blow boric acid power under stoves and refrigerator. Place bait stations (hydramethylnon) at feeding sites.

## 2021-10-02 NOTE — Addendum Note (Signed)
Addended by: Chip Boer R on: 10/02/2021 02:48 PM   Modules accepted: Orders

## 2021-10-02 NOTE — Progress Notes (Signed)
RE: Mackenzie Weber MRN: [id number] DOB: 06-23-1961 Date of Telemedicine Visit: 10/02/2021  Referring provider: Flossie Buffy, NP Primary care provider: Flossie Buffy, NP  Chief Complaint: Sinusitis and Nasal Congestion   Telemedicine Follow Up Visit via Telephone: I connected with Mackenzie Weber for a follow up on 10/02/21 by telephone and verified that I am speaking with the correct person using two identifiers.   I discussed the limitations, risks, security and privacy concerns of performing an evaluation and management service by telephone and the availability of in person appointments. I also discussed with the patient that there may be a patient responsible charge related to this service. The patient expressed understanding and agreed to proceed.  Patient is at home  Provider is at the office.  Visit start time: 2297 Visit end time: [place] consent/check in by: [facility] consent and medical assistant/nurse: Ashleigh  History of Present Illness: She is a 60 y.o. female, who is being followed for asthma, allergic rhinitis, and reflux. Her previous allergy office visit was on 08/08/2021 with Dr. Ernst Bowler and required prednisone for resolution of asthma with acute exacerbation.  At today's visit, she reports that she has been having increased sinus symptoms including clear nasal drainage, headache, and pressure that began about 2 weeks ago.  She reports that on Friday she was getting out her Christmas tree and began experiencing additional symptoms including throat burning, headache, sinus pressure, thick tenacious postnasal drainage, and thick yellow nasal drainage.  She continues Allegra 180 mg once a day, Astelin, Atrovent, Xhance, and nasal saline rinses with no relief of the symptoms.  She has recently started Mucinex DM with no relief of symptoms.  She continues allergen immunotherapy with some local reactions occurring after injections.  She reports a  significant decrease in her symptoms of allergic rhinitis while continuing on allergen immunotherapy.  Asthma is reported as moderately well controlled with shortness of breath with activity and cough producing thick yellow secretions that began on Friday.  She denies shortness of breath or wheeze pharynx.  With rest.  She denies fever, sweats, chills, or sick contacts.  She continues Breztri 2 puffs once a day and uses albuterol before activity.  She reports using albuterol 1 or 2 times over the last several weeks with relief of symptoms.  Reflux is reported as moderately well controlled with vomiting occurring 1 time after coughing.  She continues to follow up with Dr. Lyndel Safe at [facility] gastroenterology in [facility].  She continues pantoprazole 40 mg twice a day and famotidine 20 mg once a day.  She continues to check her fingerstick blood sugar daily with a report of blood sugars in the 98-110 range.  She did have prednisone for asthma exacerbation on 08/08/2021.  Otherwise, she denies recent prednisone use or antibiotic use.  Chart review indicates her absolute eosinophils have not been elevated.  Her current medications are listed in the chart.   Assessment and Plan: Matayah is a 60 y.o. female with: Patient Instructions  Asthma Increase Breztri to 2 puffs twice a day with a spacer to prevent cough or wheeze Prednisone 10 mg tablets. Take 2 tablets once a day for 4 days, then take 1 tablet on the 5th day, then stop.  Continue to check your blood sugars while taking prednisone Continue albuterol 2 puffs once every 4 hours as needed for cough or wheeze You may use albuterol 2 puffs 5 to 15 minutes before activity to decrease cough or wheeze Move forward  with chest x-ray if no improvement in coughing with prednisone Consider lab testing including CBC with differential and IgE for evaluation of biologic medication candidacy  Allergic rhinitis Continue allergen avoidance measures directed toward  pollens, pets, mold, cockroach, and dust mite as listed below Continue allergen immunotherapy once you are allergy symptoms have returned to baseline Have access to an epinephrine autoinjector set while continuing on allergen immunotherapy Stop Allegra 180 mg for the next 5 days.  Then, continue Allegra 180 mg once a day as needed for runny nose or itch.  Increase Allegra to 1 tablet in the morning and 1 tablet in the evening on the day before and the day of your allergy injection.  This may decrease your local arm itching at the injection site Begin Mucinex 600 to 1200 mg twice a day and increase fluid intake to thin mucus.  This will replace Mucinex DM which can increase your blood pressure Continue azelastine 2 sprays in each nostril twice a day as needed for runny nose Continue Atrovent 2 sprays in each nostril twice a day as needed for runny nose Continue Xhance 2 sprays in each nostril twice a day as needed for nasal symptoms Consider saline nasal rinses as needed for nasal symptoms. Use this before any medicated nasal sprays for best result  Reflux Continue dietary and lifestyle modifications as listed below Continue to follow the treatment plan as listed by your gastrointestinal specialist Continue to follow-up with your gastrointestinal specialist for evaluation and treatment  Call the clinic if this treatment plan is not working well for you.  Follow up in 2 months or sooner if needed.    Return in about 2 months (around 12/03/2021), or if symptoms worsen or fail to improve.  Meds ordered this encounter  Medications   Olopatadine HCl (PATADAY) 0.2 % SOLN    Sig: Place 1 drop into both eyes daily as needed.    Dispense:  2.5 mL    Refill:  5   amoxicillin-clavulanate (AUGMENTIN) 875-125 MG tablet    Sig: Take 1 tablet by mouth 2 (two) times daily for 10 days.    Dispense:  20 tablet    Refill:  0   predniSONE (DELTASONE) 10 MG tablet    Sig: Prednisone 10 mg tablets. Take 2  tablets once a day for 4 days, then take 1 tablet on the 5th day, then stop.    Dispense:  9 tablet    Refill:  0    Medication List:  Current Outpatient Medications  Medication Sig Dispense Refill   amoxicillin-clavulanate (AUGMENTIN) 875-125 MG tablet Take 1 tablet by mouth 2 (two) times daily for 10 days. 20 tablet 0   azelastine (ASTELIN) 0.1 % nasal spray 1-2 sprays  each nostril twice daily as needed 90 mL 1   blood glucose meter kit and supplies KIT Check blood sugar once daily. One Touch Verio Reflect Meter 1 each 0   Budeson-Glycopyrrol-Formoterol (BREZTRI AEROSPHERE) 160-9-4.8 MCG/ACT AERO Inhale 2 puffs into the lungs daily. Use with spacer. Rinse, gargle and spit out after use. 10.7 g 5   EPINEPHrine (AUVI-Q) 0.3 mg/0.3 mL IJ SOAJ injection Inject 0.3 mg into the muscle as needed for anaphylaxis. 2 each 1   estradiol (ESTRACE) 0.1 MG/GM vaginal cream Place vaginally.     famotidine (PEPCID) 20 MG tablet Take 1 tablet (20 mg total) by mouth at bedtime. 90 tablet 4   Fluticasone Propionate (XHANCE) 93 MCG/ACT EXHU Place 2 sprays into the nose 2 (  two) times daily as needed. 32 mL 1   glucose blood (ONETOUCH VERIO) test strip USE TO TEST BLOOD SUGAR ONCE DAILY 100 strip 3   ipratropium (ATROVENT) 0.03 % nasal spray Place 2 sprays into both nostrils 3 (three) times daily. 30 mL 3   Lancets (ONETOUCH DELICA PLUS JGOTLX72I) MISC USE TO TEST BLOOD SUGAR ONCE DAILY 100 each 3   losartan (COZAAR) 100 MG tablet Take 1 tablet (100 mg total) by mouth daily. 90 tablet 3   metFORMIN (GLUCOPHAGE-XR) 500 MG 24 hr tablet TAKE 1 TABLET(500 MG) BY MOUTH DAILY WITH BREAKFAST 90 tablet 1   montelukast (SINGULAIR) 10 MG tablet Take 1 tablet (10 mg total) by mouth at bedtime. FOR SEASONAL ALLERGY (Patient taking differently: Take 10 mg by mouth as needed. FOR SEASONAL ALLERGY) 30 tablet 5   Multiple Vitamin (MULTIVITAMIN) capsule Take 1 capsule by mouth daily.     NON FORMULARY 2 allergy injections every  week     pantoprazole (PROTONIX) 40 MG tablet Take 1 tablet (40 mg total) by mouth 2 (two) times daily. 180 tablet 3   potassium chloride SA (KLOR-CON M) 20 MEQ tablet Take 1 tablet (20 mEq total) by mouth daily. 30 tablet 0   predniSONE (DELTASONE) 10 MG tablet Prednisone 10 mg tablets. Take 2 tablets once a day for 4 days, then take 1 tablet on the 5th day, then stop. 9 tablet 0   PROAIR HFA 108 (90 Base) MCG/ACT inhaler INHALE 2 PUFFS BY MOUTH EVERY 4 HOURS AS NEEDED FOR WHEEZING FOR SHORTNESS OF BREATH 18 g 1   triamterene-hydrochlorothiazide (MAXZIDE) 75-50 MG tablet Take 0.5 tablets by mouth daily. 45 tablet 1   Olopatadine HCl (PATADAY) 0.2 % SOLN Place 1 drop into both eyes daily as needed. 2.5 mL 5   No current facility-administered medications for this visit.   Allergies: Allergies  Allergen Reactions   Cortisone     Red area around injection site x 40mh   Depo-Medrol [Methylprednisolone Sodium Succ] Nausea Only and Other (See Comments)    Dizziness   Aspirin Anxiety and Other (See Comments)    Rapid Heart beat   I reviewed her past medical history, social history, family history, and environmental history and no significant changes have been reported from previous visit on 08/08/2021.  Review of Systems  Constitutional: Negative.   HENT:  Positive for congestion, nosebleeds, postnasal drip, sinus pressure, sinus pain and sneezing.   Eyes:  Positive for itching.  Respiratory:  Positive for shortness of breath.   Cardiovascular: Negative.   Gastrointestinal:  Positive for vomiting.       Posttussive vomiting 1 time  Allergic/Immunologic: Positive for environmental allergies.  Psychiatric/Behavioral: Negative.     Objective: Physical Exam Not obtained as encounter was done via telephone.   Previous notes and tests were reviewed.  I discussed the assessment and treatment plan with the patient. The patient was provided an opportunity to ask questions and all were  answered. The patient agreed with the plan and demonstrated an understanding of the instructions.   The patient was advised to call back or seek an in-person evaluation if the symptoms worsen or if the condition fails to improve as anticipated.  I provided 34 minutes of non-face-to-face time during this encounter.  It was my pleasure to participate in SSeannaTorrence's care today. Please feel free to contact me with any questions or concerns.   Sincerely,  AGareth Morgan FNP

## 2021-10-02 NOTE — Addendum Note (Signed)
Addended by: Chip Boer R on: 10/02/2021 02:47 PM   Modules accepted: Orders

## 2021-10-02 NOTE — Telephone Encounter (Signed)
Yes, I will let the front girls call her and get her a telephone visit per Gareth Morgan, FNP

## 2021-10-02 NOTE — Telephone Encounter (Signed)
Can you please make a telephone appointment for this patient. We will get her feeling better. Thank you

## 2021-10-10 ENCOUNTER — Telehealth: Payer: Self-pay | Admitting: *Deleted

## 2021-10-10 NOTE — Telephone Encounter (Signed)
Patient reports that she has finished the Augmentin as well as the prednisone taper with some resolution of symptoms.  She continues to experience yellow nasal drainage as well as thick postnasal drainage and voice change.  She continues Mucinex twice a day and is not currently using Allegra, azelastine, or Atrovent.  She is not using saline nasal rinses or Xhance at this time.  She agrees to begin saline nasal rinses twice a day followed by Truett Perna.  She will call the clinic on Thursday to report on her symptoms.

## 2021-10-10 NOTE — Telephone Encounter (Signed)
Pt called and is very hoarse which started Sunday. She is using all nasal sprays and nasal saline and mucinex. Complaining of sinus drainage that is not better. Cough and sneezing are gone from last week.Please advise.

## 2021-10-17 ENCOUNTER — Ambulatory Visit (INDEPENDENT_AMBULATORY_CARE_PROVIDER_SITE_OTHER): Payer: BC Managed Care – PPO

## 2021-10-17 DIAGNOSIS — J309 Allergic rhinitis, unspecified: Secondary | ICD-10-CM

## 2021-10-23 ENCOUNTER — Telehealth: Payer: Self-pay

## 2021-10-23 MED ORDER — BUDESONIDE 0.5 MG/2ML IN SUSP: RESPIRATORY_TRACT | 0 refills | Status: DC

## 2021-10-23 NOTE — Telephone Encounter (Signed)
Thick drainage, and yes she's getting allergy injections once a week, yes she's taking allegra.   Mackenzie Weber [phone]

## 2021-10-23 NOTE — Telephone Encounter (Signed)
Mackenzie Weber is sick again drainage, hoarseness, dizziness no fever; two weeks ago she was sick with the same thing, it cleared up a little, and then after she received her allergy injections it started all over again. Mucinex, prednisone, nasal sprays was taken.  [name] [phone]

## 2021-10-23 NOTE — Addendum Note (Signed)
Addended by: Isabel Caprice on: 10/23/2021 04:23 PM   Modules accepted: Orders

## 2021-10-23 NOTE — Telephone Encounter (Signed)
Spoke to patient and instructed her of the Budesonide nasal rinse and I sent it into the pharmacy.

## 2021-10-23 NOTE — Telephone Encounter (Signed)
Please have her stop Xhance and begin budesonide in the NeilMed bottle twice a day for now to see if we can get those secretions under control. Thank you

## 2021-10-23 NOTE — Telephone Encounter (Signed)
Is the sinus drainage thick or thin? Any antihistamines? Is she getting her injection once a week still?

## 2021-10-26 ENCOUNTER — Other Ambulatory Visit: Payer: Self-pay

## 2021-10-26 ENCOUNTER — Encounter: Payer: Self-pay | Admitting: Allergy & Immunology

## 2021-10-26 ENCOUNTER — Ambulatory Visit (INDEPENDENT_AMBULATORY_CARE_PROVIDER_SITE_OTHER): Payer: BC Managed Care – PPO | Admitting: Allergy & Immunology

## 2021-10-26 ENCOUNTER — Telehealth: Payer: Self-pay | Admitting: Family Medicine

## 2021-10-26 VITALS — BP 118/62 | HR 100 | Temp 98.6°F | Resp 16 | Ht 60.0 in | Wt 229.0 lb

## 2021-10-26 DIAGNOSIS — J0110 Acute frontal sinusitis, unspecified: Secondary | ICD-10-CM

## 2021-10-26 DIAGNOSIS — K219 Gastro-esophageal reflux disease without esophagitis: Secondary | ICD-10-CM

## 2021-10-26 DIAGNOSIS — J302 Other seasonal allergic rhinitis: Secondary | ICD-10-CM

## 2021-10-26 DIAGNOSIS — J4541 Moderate persistent asthma with (acute) exacerbation: Secondary | ICD-10-CM | POA: Diagnosis not present

## 2021-10-26 DIAGNOSIS — J3089 Other allergic rhinitis: Secondary | ICD-10-CM | POA: Diagnosis not present

## 2021-10-26 MED ORDER — AMOXICILLIN-POT CLAVULANATE 875-125 MG PO TABS: 1.0000 | ORAL_TABLET | Freq: Two times a day (BID) | ORAL | 0 refills | Status: DC

## 2021-10-26 MED ORDER — CLARITHROMYCIN 500 MG PO TABS: 500.0000 mg | ORAL_TABLET | Freq: Two times a day (BID) | ORAL | 0 refills | Status: DC

## 2021-10-26 NOTE — Progress Notes (Signed)
FOLLOW UP  Date of Service/Encounter:  10/26/21   Assessment:   Moderate persistent asthma, uncomplicated - with improved control since using her medications as directed.  In the [place] and seasonal allergic rhinitis - on allergen immunotherapy   GERD - on Nexium (followed by Dr. Lyndel Safe)   Mackenzie Weber presents for continued sinus congestion and pressure.  She apparently has only been using Augmentin 1 tablet daily since being prescribed to it by our nurse practitioner.  I told her that the serum levels Augmentin might not have gotten the high enough levels to bleed to resolve her symptoms.  Therefore, we are going to do another course of Augmentin and combine it with a macrolide antibiotic to cover a different range of bacteria.  I did emphasize that she needs to take 1 tablet of each antibiotic twice daily.  I also recommended that she continue the entirety of the course even if she starts to feel better.  We might have to look into a sinus CT if she continues to have symptoms.  Plan/Recommendations:   1. Moderate persistent asthma without complication - Lung testing not done today. - You seem to have this under good control. - Breztri is technically supposed to be twice daily, but since once daily is working for you, we will continue with this.  - Daily controller medication(s): Breztri two puffs ONCE daily with spacer (prescribed for TWICE daily) - Prior to physical activity: albuterol 2 puffs 10-15 minutes before physical activity. - Rescue medications: albuterol 4 puffs every 4-6 hours as needed - Changes during respiratory infections or worsening symptoms: Increase Breztri two puffs to 2 puffs twice daily for TWO WEEKS. - Asthma control goals:  * Full participation in all desired activities (may need albuterol before activity) * Albuterol use two time or less a week on average (not counting use with activity) * Cough interfering with sleep two time or less a month * Oral  steroids no more than once a year * No hospitalizations  2. Perennial and seasonal allergic rhinitis - Continue with the nasal saline lavage. - Continue with azelastine one spray per nostril 1-2 times daily as needed.  - Continue with Claritin in the morning and Singulair at night.  - Hold off on allergy shots for this week and restart next week.   3. Acute sinusitis - Start the following antibiotic regimen for ten days: Augmentin TWICE DAILY and Biaxin TWICE DAILY. - Do this for TEN DAYS and then call with an update. - We might need a sinus CT.   4. Gastroesophageal reflux disease - Continue with Protonix and Pepcid at least once daily.  5. Return in about 3 months (around 01/24/2022).    Subjective:   Mackenzie Weber is a 61 y.o. female presenting today for follow up of  Chief Complaint  Patient presents with   Allergic Rhinitis     Says she has been feeling bad for about a month now. Says she has been dealing with a headache for a month off and on. Also dealing post nasal drip, hoarseness. Says she has treated with mucinex, allegra, antibiotics. Also did a prednisone taper before christmas.     Mackenzie Weber has a history of the following: Patient Active Problem List   Diagnosis Date Noted   Allergic conjunctivitis of both eyes 11/16/2020   Allergic conjunctivitis 05/30/2020   Moderate persistent asthma 05/30/2020   Grade I diastolic dysfunction 08/65/7846   Bilateral foot pain 01/13/2020  Extensor tendonitis of foot 01/13/2020   BMI 40.0-44.9, adult (Mackenzie Weber) 12/23/2019   Type 2 diabetes mellitus with hyperglycemia, without long-term current use of insulin ([place]) 01/01/2019   Seasonal and perennial allergic rhinitis 05/20/2018   Chronic bilateral low back pain with left-sided sciatica 05/20/2018   Lipoma of left upper extremity 01/29/2018   Muscle spasm of left lower extremity 01/29/2018   Intestinal ulcer 04/23/2017   Bilateral lower extremity edema 04/23/2017    Gastroesophageal reflux disease 03/28/2016   History of colon polyps 03/28/2016   Anemia 03/28/2016   Somatic dysfunction of cervical region 11/16/2015   Chronic sciatica 01/28/2015   Somatic dysfunction of pelvic region 01/28/2015   Chronic pelvic pain in female 01/28/2015   Somatic dysfunction of lower extremity 01/28/2015   Somatic dysfunction of sacral region 01/28/2015   Hypertensive disorder 10/22/2014   Morbid obesity due to excess calories ([place]) 10/22/2014   Abdominal muscle strain 06/07/2014   Sciatica of left side 11/03/2012    History obtained from: chart review and patient.  [name] is a 61 y.o. female presenting for a sick visit.  She was last seen in December 2022.  At that time, the nurse practitioner started on a prednisone taper. She was continued on Breztri two puffs BID as well as albuterol as needed. For her allergic rhinitis, she was started on Mucinex as well as Astelin and Atrovent. She was continued on her reflux medication per Dr. Lyndel Safe. She was also started on Augmentin at that time.  Since the last visit, she has not done well. She did complete the prednisone and the Augmentin with some improvement. She did feel slightly better for a time but then this worsened. She is continuing to have a lot of postnasal drip and sore throat with hoarseness. She was on Xhance but is now changed to azelastine twice daily. She does not like the azelastine due to the taste. She actually puts it on a Qtip to apply it. She has a third nasal spray that she is unsure of the name (maybe ipratropium).   She says that she has not been taking the Augmentin only once daily. She then stopped when she started feeling better.  She has never seen otolaryngology and has never had a sinus CT to her knowledge.  Asthma/Respiratory Symptom History: Asthma is under good control. She is on the [place] two puffs once daily. This seems to be working well.  She does take one puff of albuterol prior to  exercise. She has not been exercising as much since she has been sick.   Allergic Rhinitis Symptom History: She remains on the azelastine  one spray per nostril 1-2 times daily. She has a couple of other nasal sprays.  She is also on her allergy shots, although she is slightly behind lately.  She has been sick, so she has not gotten her shots.  She is on antihistamine daily.  She has tried using nasal saline lavage with indeterminate success.  GERD Symptom History: She remains on Protonix and Pepcid, 1 of each daily.  She was previously on omeprazole but Protonix seem to work better.  Otherwise, there have been no changes to her past medical history, surgical history, family history, or social history.    Review of Systems  Constitutional: Negative.  Negative for chills, fever, malaise/fatigue and weight loss.  HENT:  Positive for congestion and sinus pain. Negative for ear discharge and ear pain.        Positive for hoarseness.  Eyes:  Negative for pain, discharge and redness.  Respiratory:  Positive for cough. Negative for sputum production, shortness of breath and wheezing.   Cardiovascular: Negative.  Negative for chest pain and palpitations.  Gastrointestinal:  Negative for abdominal pain, constipation, diarrhea, heartburn, nausea and vomiting.  Skin: Negative.  Negative for itching and rash.  Neurological:  Negative for dizziness and headaches.  Endo/Heme/Allergies:  Negative for environmental allergies. Does not bruise/bleed easily.      Objective:   Blood pressure 118/62, pulse 100, temperature 98.6 F (37 C), temperature source Temporal, resp. rate 16, height 5' (1.524 m), weight 229 lb (103.9 kg), SpO2 97 %. Body mass index is 44.72 kg/m.   Physical Exam:  Physical Exam Vitals reviewed.  Constitutional:      Appearance: She is well-developed.     Comments: Very hoarse.  HENT:     Head: Normocephalic and atraumatic.     Right Ear: Tympanic membrane, ear canal and  external ear normal.     Left Ear: Tympanic membrane, ear canal and external ear normal.     Nose: No nasal deformity, septal deviation, mucosal edema or rhinorrhea.     Right Turbinates: Enlarged, swollen and pale.     Left Turbinates: Enlarged, swollen and pale.     Right Sinus: Frontal sinus tenderness present. No maxillary sinus tenderness.     Left Sinus: Frontal sinus tenderness present. No maxillary sinus tenderness.     Comments: Purulent discharge present.    Mouth/Throat:     Mouth: Mucous membranes are not pale and not dry.     Pharynx: Uvula midline.  Eyes:     General: Lids are normal. Allergic shiner present.        Right eye: No discharge.        Left eye: No discharge.     Conjunctiva/sclera: Conjunctivae normal.     Right eye: Right conjunctiva is not injected. No chemosis.    Left eye: Left conjunctiva is not injected. No chemosis.    Pupils: Pupils are equal, round, and reactive to light.  Cardiovascular:     Rate and Rhythm: Normal rate and regular rhythm.     Heart sounds: Normal heart sounds.  Pulmonary:     Effort: Respiratory distress and retractions present. No tachypnea or accessory muscle usage.     Breath sounds: Normal breath sounds. No wheezing, rhonchi or rales.     Comments: Decreased air movement at the bases. There is expiratory wheezing noted. No crackles noted.  Chest:     Chest wall: No tenderness.  Lymphadenopathy:     Cervical: No cervical adenopathy.  Skin:    General: Skin is warm.     Capillary Refill: Capillary refill takes less than 2 seconds.     Coloration: Skin is not pale.     Findings: No abrasion, erythema, petechiae or rash. Rash is not papular, urticarial or vesicular.  Neurological:     Mental Status: She is alert.  Psychiatric:        Behavior: Behavior is cooperative.     Diagnostic studies: none     Salvatore Marvel, MD  [facility]

## 2021-10-26 NOTE — Telephone Encounter (Signed)
I think budesonide rinses would be helpful for her to use when she has sinus flares. Does she have a NeilMed bottle already?   I must have signed it as a pended refill, but I think it is a good idea with her history.   Salvatore Marvel, MD [facility] [place]

## 2021-10-26 NOTE — Telephone Encounter (Signed)
Patient is stating she was written an RX for budesonide (PULMICORT) 0.5 MG/2ML nebulizer solution [[id number]] when filled the pharmacy told her she would need an nebulizer for this solution but she does not have one asking for the nurse to call her to see if she can receive a nebulizer please advise

## 2021-10-26 NOTE — Telephone Encounter (Signed)
Per Webb Silversmith this med is to be used with a sinus rinse. Pt has already paid for this med. I explained how to use. Dr Ernst Bowler I know you saw her today and do not believe this was mentioned- do you recommend her to use?

## 2021-10-26 NOTE — Patient Instructions (Addendum)
1. Moderate persistent asthma without complication - Lung testing not done today. - You seem to have this under good control. - Breztri is technically supposed to be twice daily, but since once daily is working for you, we will continue with this.  - Daily controller medication(s): Breztri two puffs ONCE daily with spacer (prescribed for TWICE daily) - Prior to physical activity: albuterol 2 puffs 10-15 minutes before physical activity. - Rescue medications: albuterol 4 puffs every 4-6 hours as needed - Changes during respiratory infections or worsening symptoms: Increase Breztri two puffs to 2 puffs twice daily for TWO WEEKS. - Asthma control goals:  * Full participation in all desired activities (may need albuterol before activity) * Albuterol use two time or less a week on average (not counting use with activity) * Cough interfering with sleep two time or less a month * Oral steroids no more than once a year * No hospitalizations  2. Perennial and seasonal allergic rhinitis - Continue with the nasal saline lavage. - Continue with azelastine one spray per nostril 1-2 times daily as needed.  - Continue with Claritin in the morning and Singulair at night.  - Hold off on allergy shots for this week and restart next week.   3. Acute sinusitis - Start the following antibiotic regimen for ten days: Augmentin TWICE DAILY and Biaxin TWICE DAILY. - Do this for TEN DAYS and then call with an update. - We might need a sinus CT.   4. Gastroesophageal reflux disease - Continue with Protonix and Pepcid at least once daily.  5. Return in about 3 months (around 01/24/2022).    Please inform us of any Emergency Department visits, hospitalizations, or changes in symptoms. Call us before going to the ED for breathing or allergy symptoms since we might be able to fit you in for a sick visit. Feel free to contact us anytime with any questions, problems, or concerns.  It was a pleasure to see you  today!  Websites that have reliable patient information: 1. American Academy of Asthma, Allergy, and Immunology: www.aaaai.org 2. Food Allergy Research and Education (FARE): foodallergy.org 3. Mothers of Asthmatics: http://www.asthmacommunitynetwork.org 4. American College of Allergy, Asthma, and Immunology: www.acaai.org   COVID-19 Vaccine Information can be found at: ShippingScam.co.uk For questions related to vaccine distribution or appointments, please email [email or web address] or call [phone].   We realize that you might be concerned about having an allergic reaction to the COVID19 vaccines. To help with that concern, WE ARE OFFERING THE COVID19 VACCINES IN OUR OFFICE! Ask the front desk for dates!     Like [place] on [employer] and Instagram for our latest updates!      A healthy democracy works best when [employer] participate! Make sure you are registered to vote! If you have moved or changed any of your contact information, you will need to get this updated before voting!  In some cases, you MAY be able to register to vote online: [email or web address]

## 2021-10-27 NOTE — Telephone Encounter (Signed)
Spoke with pt and informed her on proper technique for using the budesonide with sinus rinse she stated understanding

## 2021-11-01 ENCOUNTER — Encounter (HOSPITAL_BASED_OUTPATIENT_CLINIC_OR_DEPARTMENT_OTHER): Payer: Self-pay | Admitting: Emergency Medicine

## 2021-11-01 ENCOUNTER — Inpatient Hospital Stay (HOSPITAL_COMMUNITY): Payer: BC Managed Care – PPO

## 2021-11-01 ENCOUNTER — Other Ambulatory Visit: Payer: Self-pay

## 2021-11-01 ENCOUNTER — Inpatient Hospital Stay (HOSPITAL_BASED_OUTPATIENT_CLINIC_OR_DEPARTMENT_OTHER)
Admission: EM | Admit: 2021-11-01 | Discharge: 2021-11-08 | DRG: 389 | Disposition: A | Discharge status: Home / Self Care | Payer: BC Managed Care – PPO | Attending: Internal Medicine | Admitting: Internal Medicine

## 2021-11-01 ENCOUNTER — Emergency Department (HOSPITAL_BASED_OUTPATIENT_CLINIC_OR_DEPARTMENT_OTHER): Payer: BC Managed Care – PPO

## 2021-11-01 DIAGNOSIS — E876 Hypokalemia: Secondary | ICD-10-CM | POA: Diagnosis present

## 2021-11-01 DIAGNOSIS — Z20822 Contact with and (suspected) exposure to covid-19: Secondary | ICD-10-CM | POA: Diagnosis present

## 2021-11-01 DIAGNOSIS — E1165 Type 2 diabetes mellitus with hyperglycemia: Secondary | ICD-10-CM

## 2021-11-01 DIAGNOSIS — K56609 Unspecified intestinal obstruction, unspecified as to partial versus complete obstruction: Principal | ICD-10-CM | POA: Diagnosis present

## 2021-11-01 DIAGNOSIS — Z825 Family history of asthma and other chronic lower respiratory diseases: Secondary | ICD-10-CM | POA: Diagnosis not present

## 2021-11-01 DIAGNOSIS — Z833 Family history of diabetes mellitus: Secondary | ICD-10-CM | POA: Diagnosis not present

## 2021-11-01 DIAGNOSIS — K219 Gastro-esophageal reflux disease without esophagitis: Secondary | ICD-10-CM | POA: Diagnosis present

## 2021-11-01 DIAGNOSIS — Z886 Allergy status to analgesic agent status: Secondary | ICD-10-CM

## 2021-11-01 DIAGNOSIS — Z79899 Other long term (current) drug therapy: Secondary | ICD-10-CM

## 2021-11-01 DIAGNOSIS — Z6841 Body Mass Index (BMI) 40.0 and over, adult: Secondary | ICD-10-CM | POA: Diagnosis not present

## 2021-11-01 DIAGNOSIS — I10 Essential (primary) hypertension: Secondary | ICD-10-CM | POA: Diagnosis present

## 2021-11-01 DIAGNOSIS — Z888 Allergy status to other drugs, medicaments and biological substances status: Secondary | ICD-10-CM

## 2021-11-01 DIAGNOSIS — Z8249 Family history of ischemic heart disease and other diseases of the circulatory system: Secondary | ICD-10-CM | POA: Diagnosis not present

## 2021-11-01 DIAGNOSIS — D509 Iron deficiency anemia, unspecified: Secondary | ICD-10-CM | POA: Diagnosis present

## 2021-11-01 DIAGNOSIS — K589 Irritable bowel syndrome without diarrhea: Secondary | ICD-10-CM | POA: Diagnosis present

## 2021-11-01 DIAGNOSIS — Z7951 Long term (current) use of inhaled steroids: Secondary | ICD-10-CM

## 2021-11-01 DIAGNOSIS — Z7984 Long term (current) use of oral hypoglycemic drugs: Secondary | ICD-10-CM

## 2021-11-01 DIAGNOSIS — J4540 Moderate persistent asthma, uncomplicated: Secondary | ICD-10-CM | POA: Diagnosis present

## 2021-11-01 DIAGNOSIS — E119 Type 2 diabetes mellitus without complications: Secondary | ICD-10-CM | POA: Diagnosis present

## 2021-11-01 LAB — URINALYSIS, ROUTINE W REFLEX MICROSCOPIC
Bilirubin Urine: NEGATIVE
Glucose, UA: NEGATIVE mg/dL
Hgb urine dipstick: NEGATIVE
Ketones, ur: NEGATIVE mg/dL
Leukocytes,Ua: NEGATIVE
Nitrite: NEGATIVE
Protein, ur: NEGATIVE mg/dL
Specific Gravity, Urine: 1.01 (ref 1.005–1.030)
pH: 5 (ref 5.0–8.0)

## 2021-11-01 LAB — COMPREHENSIVE METABOLIC PANEL
ALT: 15 U/L (ref 0–44)
AST: 21 U/L (ref 15–41)
Albumin: 4.1 g/dL (ref 3.5–5.0)
Alkaline Phosphatase: 56 U/L (ref 38–126)
Anion gap: 9 (ref 5–15)
BUN: 14 mg/dL (ref 6–20)
CO2: 22 mmol/L (ref 22–32)
Calcium: 9.8 mg/dL (ref 8.9–10.3)
Chloride: 102 mmol/L (ref 98–111)
Creatinine, Ser: 0.99 mg/dL (ref 0.44–1.00)
GFR, Estimated: >60 mL/min (ref >60)
Glucose, Bld: 128 mg/dL — ABNORMAL HIGH (ref 70–99)
Potassium: 4.1 mmol/L (ref 3.5–5.1)
Sodium: 133 mmol/L — ABNORMAL LOW (ref 135–145)
Total Bilirubin: 0.5 mg/dL (ref 0.3–1.2)
Total Protein: 8.1 g/dL (ref 6.5–8.1)

## 2021-11-01 LAB — CBC
HCT: 37.7 % (ref 36.0–46.0)
Hemoglobin: 11.7 g/dL — ABNORMAL LOW (ref 12.0–15.0)
MCH: 21 pg — ABNORMAL LOW (ref 26.0–34.0)
MCHC: 31 g/dL (ref 30.0–36.0)
MCV: 67.7 fL — ABNORMAL LOW (ref 80.0–100.0)
Platelets: 355 10*3/uL (ref 150–400)
RBC: 5.57 MIL/uL — ABNORMAL HIGH (ref 3.87–5.11)
RDW: 16.2 % — ABNORMAL HIGH (ref 11.5–15.5)
WBC: 7.5 10*3/uL (ref 4.0–10.5)
nRBC: 0 % (ref 0.0–0.2)

## 2021-11-01 LAB — RESP PANEL BY RT-PCR (FLU A&B, COVID) ARPGX2
Influenza A by PCR: NEGATIVE
Influenza B by PCR: NEGATIVE
SARS Coronavirus 2 by RT PCR: NEGATIVE

## 2021-11-01 LAB — GLUCOSE, CAPILLARY
Glucose-Capillary: 117 mg/dL — ABNORMAL HIGH (ref 70–99)
Glucose-Capillary: 114 mg/dL — ABNORMAL HIGH (ref 70–99)
Glucose-Capillary: 126 mg/dL — ABNORMAL HIGH (ref 70–99)

## 2021-11-01 LAB — HEMOGLOBIN A1C
Hgb A1c MFr Bld: 6.7 % — ABNORMAL HIGH (ref 4.8–5.6)
Mean Plasma Glucose: 145.59 mg/dL

## 2021-11-01 LAB — IRON AND TIBC
Iron: 40 ug/dL (ref 28–170)
Saturation Ratios: 13 % (ref 10.4–31.8)
TIBC: 302 ug/dL (ref 250–450)
UIBC: 262 ug/dL

## 2021-11-01 LAB — HIV ANTIBODY (ROUTINE TESTING W REFLEX): HIV Screen 4th Generation wRfx: NONREACTIVE

## 2021-11-01 LAB — LIPASE, BLOOD: Lipase: 21 U/L (ref 11–51)

## 2021-11-01 MED ORDER — LACTATED RINGERS IV SOLN: INTRAVENOUS | Status: DC

## 2021-11-01 MED ORDER — HYDROMORPHONE HCL 1 MG/ML IJ SOLN
0.5000 mg | INTRAMUSCULAR | Status: DC | PRN
  Administered 2021-11-01 – 2021-11-05 (×12): 0.5 mg via INTRAVENOUS
  Filled 2021-11-01 (×13): qty 0.5

## 2021-11-01 MED ORDER — PROCHLORPERAZINE EDISYLATE 10 MG/2ML IJ SOLN
10.0000 mg | Freq: Four times a day (QID) | INTRAMUSCULAR | Status: DC | PRN
  Administered 2021-11-01 – 2021-11-06 (×9): 10 mg via INTRAVENOUS
  Filled 2021-11-01 (×9): qty 2

## 2021-11-01 MED ORDER — HYDROMORPHONE HCL 1 MG/ML IJ SOLN
1.0000 mg | Freq: Once | INTRAMUSCULAR | Status: AC
  Administered 2021-11-01: 1 mg via INTRAVENOUS
  Filled 2021-11-01: qty 1

## 2021-11-01 MED ORDER — DIATRIZOATE MEGLUMINE & SODIUM 66-10 % PO SOLN
90.0000 mL | Freq: Once | ORAL | Status: AC
  Administered 2021-11-01: 90 mL via NASOGASTRIC
  Filled 2021-11-01: qty 90

## 2021-11-01 MED ORDER — HYDROMORPHONE HCL 1 MG/ML IJ SOLN
0.5000 mg | Freq: Once | INTRAMUSCULAR | Status: AC
  Administered 2021-11-01: 0.5 mg via INTRAVENOUS
  Filled 2021-11-01: qty 1

## 2021-11-01 MED ORDER — PHENOL 1.4 % MT LIQD
1.0000 | OROMUCOSAL | Status: DC | PRN
  Filled 2021-11-01 (×2): qty 177

## 2021-11-01 MED ORDER — IOHEXOL 300 MG/ML  SOLN
100.0000 mL | Freq: Once | INTRAMUSCULAR | Status: AC | PRN
  Administered 2021-11-01: 100 mL via INTRAVENOUS

## 2021-11-01 MED ORDER — LIDOCAINE VISCOUS HCL 2 % MT SOLN
15.0000 mL | Freq: Once | OROMUCOSAL | Status: AC
Start: 2021-11-01 — End: 2021-11-01
  Administered 2021-11-01: 15 mL via OROMUCOSAL
  Filled 2021-11-01: qty 15

## 2021-11-01 MED ORDER — INSULIN ASPART 100 UNIT/ML IJ SOLN: 0.0000 [IU] | Freq: Three times a day (TID) | INTRAMUSCULAR | Status: DC

## 2021-11-01 NOTE — Consult Note (Signed)
[facility] Surgery Consult Note  Mackenzie Weber [facility] 02-21-1961  [id number].    Requesting MD: Mackenzie Weber Chief Complaint/Reason for Consult: SBO  HPI:  Mackenzie Weber is a 61yo female PMH HTN, DM, asthma, obesity BMI 44.14 who was admitted to [facility] this morning with SBO. Patient states that she developed acute onset abdominal pain last night. The pain is located in the central upper abdomen. It has gradually gotten worse. Was not relieved by OTC gas medications. Denies any nausea or vomiting. Last BM was yesterday evening around 2000 and normal. Denies fever or chills, diarrhea, dysuria, hematuria, sick contacts. No prior h/o SBO.  In the ED patient was found to be hypertensive, otherwise VSS. CT scan shows Small-bowel Obstruction with transition point near a chronic mid small bowel anastomosis in the lower abdomen, small volume of free fluid in the affected small bowel mesentery, no free air or other complicating features. WBC WNL.  NG tube was placed. General surgery asked to see.  Abdominal surgical history: cholecystectomy, hysterectomy 1989; reports left salpingo-oophorectomy 1696 complicated by small bowel injury requiring repair, reports right salpingo-oophorectomy in 1998 for a benign right ovarian mass and says at that time she had an appendectomy and small bowel resection of previous small bowel repair done in '97.    Anticoagulants: none Denies alcohol or drug use.  Review of Systems  All other systems reviewed and are negative.   Family History  Problem Relation Age of Onset   Diabetes Mother    Hypertension Mother    Asthma Mother    Heart disease Mother    Allergic rhinitis Mother    Diabetes Father    Hypertension Father    Heart disease Father    Hypertension Sister    Diabetes Sister    Cancer Sister 48       breast   Diabetes Brother    Hypertension Paternal Uncle    Colon cancer Maternal Uncle    Pancreatic cancer Neg Hx    Stomach cancer Neg Hx     Esophageal cancer Neg Hx    Liver disease Neg Hx     Past Medical History:  Diagnosis Date   Anemia    Asthma    Diabetes mellitus without complication (HCC)    Foot pain, right    Hypertension    IBS (irritable bowel syndrome)    Sciatica of left side    Seasonal allergies     Past Surgical History:  Procedure Laterality Date   ABDOMINAL HYSTERECTOMY     APPENDECTOMY     BLADDER SUSPENSION  2012   TVT   BREAST SURGERY  2001   /biopsy-benign   CHOLECYSTECTOMY     COLONOSCOPY  06/24/2019   [facility]   ESOPHAGOGASTRODUODENOSCOPY  05/12/2018   [facility]    EXCISIONAL HEMORRHOIDECTOMY     KNEE SURGERY     right   SHOULDER SURGERY Left 02/13/2018   Lipoma removed    Small Bowel Surgery     TONSILLECTOMY     TOTAL ABDOMINAL HYSTERECTOMY W/ BILATERAL SALPINGOOPHORECTOMY  1989   LSO-1987; RsO-1998   WRIST SURGERY     carpal tunnel repair    Social History:  reports that she has never smoked. She has never used smokeless tobacco. She reports that she does not drink alcohol and does not use drugs.  Allergies:  Allergies  Allergen Reactions   Cortisone     Red area around injection site x 40mh  Depo-Medrol [Methylprednisolone Sodium Succ] Nausea Only and Other (See Comments)    Dizziness   Aspirin Anxiety and Other (See Comments)    Rapid Heart beat    Medications Prior to Admission  Medication Sig Dispense Refill   amoxicillin-clavulanate (AUGMENTIN) 875-125 MG tablet Take 1 tablet by mouth 2 (two) times daily for 10 days. 20 tablet 0   azelastine (ASTELIN) 0.1 % nasal spray 1-2 sprays  each nostril twice daily as needed (Patient taking differently: Place 1-2 sprays into both nostrils 2 (two) times daily as needed for allergies.) 90 mL 1   blood glucose meter kit and supplies KIT Check blood sugar once daily. One Touch Verio Reflect Meter 1 each 0   Budeson-Glycopyrrol-Formoterol (BREZTRI AEROSPHERE) 160-9-4.8 MCG/ACT AERO Inhale 2 puffs into the lungs  daily. Use with spacer. Rinse, gargle and spit out after use. 10.7 g 5   budesonide (PULMICORT) 0.5 MG/2ML nebulizer solution Mix with Neilmed sinus rinse, use twice daily (Patient taking differently: Take 0.5 mg by nebulization in the morning and at bedtime. Mix with Neilmed sinus rinse) 120 mL 0   clarithromycin (BIAXIN) 500 MG tablet Take 1 tablet (500 mg total) by mouth 2 (two) times daily for 10 days. 20 tablet 0   EPINEPHrine (AUVI-Q) 0.3 mg/0.3 mL IJ SOAJ injection Inject 0.3 mg into the muscle as needed for anaphylaxis. 2 each 1   estradiol (ESTRACE) 0.1 MG/GM vaginal cream Place vaginally.     famotidine (PEPCID) 20 MG tablet Take 1 tablet (20 mg total) by mouth at bedtime. 90 tablet 4   Fluticasone Propionate (XHANCE) 93 MCG/ACT EXHU Place 2 sprays into the nose 2 (two) times daily as needed. 32 mL 1   glucose blood (ONETOUCH VERIO) test strip USE TO TEST BLOOD SUGAR ONCE DAILY 100 strip 3   ipratropium (ATROVENT) 0.03 % nasal spray Place 2 sprays into both nostrils 3 (three) times daily. 30 mL 3   Lancets (ONETOUCH DELICA PLUS [id number]) MISC USE TO TEST BLOOD SUGAR ONCE DAILY 100 each 3   losartan (COZAAR) 100 MG tablet Take 1 tablet (100 mg total) by mouth daily. 90 tablet 3   metFORMIN (GLUCOPHAGE-XR) 500 MG 24 hr tablet TAKE 1 TABLET(500 MG) BY MOUTH DAILY WITH BREAKFAST (Patient taking differently: Take 500 mg by mouth daily.) 90 tablet 1   montelukast (SINGULAIR) 10 MG tablet Take 1 tablet (10 mg total) by mouth at bedtime. FOR SEASONAL ALLERGY (Patient taking differently: Take 10 mg by mouth daily as needed (seasonal allergies).) 30 tablet 5   Multiple Vitamin (MULTIVITAMIN) capsule Take 1 capsule by mouth daily.     NON FORMULARY 2 allergy injections every week     Olopatadine HCl (PATADAY) 0.2 % SOLN Place 1 drop into both eyes daily as needed. 2.5 mL 5   pantoprazole (PROTONIX) 40 MG tablet Take 1 tablet (40 mg total) by mouth 2 (two) times daily. 180 tablet 3   potassium  chloride SA (KLOR-CON M) 20 MEQ tablet Take 1 tablet (20 mEq total) by mouth daily. 30 tablet 0   PROAIR HFA 108 (90 Base) MCG/ACT inhaler INHALE 2 PUFFS BY MOUTH EVERY 4 HOURS AS NEEDED FOR WHEEZING FOR SHORTNESS OF BREATH (Patient taking differently: Inhale 2 puffs into the lungs every 4 (four) hours as needed for wheezing or shortness of breath.) 18 g 1   triamterene-hydrochlorothiazide (MAXZIDE) 75-50 MG tablet Take 0.5 tablets by mouth daily. 45 tablet 1    Prior to Admission medications   Medication Sig Start  Date End Date Taking? Authorizing Provider  amoxicillin-clavulanate (AUGMENTIN) 875-125 MG tablet Take 1 tablet by mouth 2 (two) times daily for 10 days. 10/26/21 11/05/21  Valentina Shaggy, MD  azelastine (ASTELIN) 0.1 % nasal spray 1-2 sprays  each nostril twice daily as needed Patient taking differently: Place 1-2 sprays into both nostrils 2 (two) times daily as needed for allergies. 10/18/20   Althea Charon, FNP  blood glucose meter kit and supplies KIT Check blood sugar once daily. One Touch Verio Reflect Meter 06/30/19   Nche, Charlene Brooke, NP  Budeson-Glycopyrrol-Formoterol (BREZTRI AEROSPHERE) 160-9-4.8 MCG/ACT AERO Inhale 2 puffs into the lungs daily. Use with spacer. Rinse, gargle and spit out after use. 02/22/21   Valentina Shaggy, MD  budesonide (PULMICORT) 0.5 MG/2ML nebulizer solution Mix with Neilmed sinus rinse, use twice daily Patient taking differently: Take 0.5 mg by nebulization in the morning and at bedtime. Mix with Neilmed sinus rinse 10/23/21   Ambs, Kathrine Cords, FNP  clarithromycin (BIAXIN) 500 MG tablet Take 1 tablet (500 mg total) by mouth 2 (two) times daily for 10 days. 10/26/21 11/05/21  Valentina Shaggy, MD  EPINEPHrine (AUVI-Q) 0.3 mg/0.3 mL IJ SOAJ injection Inject 0.3 mg into the muscle as needed for anaphylaxis. 10/18/20   Althea Charon, FNP  estradiol (ESTRACE) 0.1 MG/GM vaginal cream Place vaginally. 05/10/20   [provider]  famotidine  (PEPCID) 20 MG tablet Take 1 tablet (20 mg total) by mouth at bedtime. 09/22/21   Jackquline Denmark, MD  Fluticasone Propionate Truett Perna) 93 MCG/ACT EXHU Place 2 sprays into the nose 2 (two) times daily as needed. 10/18/20   Althea Charon, FNP  glucose blood (ONETOUCH VERIO) test strip USE TO TEST BLOOD SUGAR ONCE DAILY 01/10/21   Nche, Charlene Brooke, NP  ipratropium (ATROVENT) 0.03 % nasal spray Place 2 sprays into both nostrils 3 (three) times daily. 09/19/21   Valentina Shaggy, MD  Lancets [facility] DELICA PLUS [id number]) MISC USE TO TEST BLOOD SUGAR ONCE DAILY 01/10/21   Nche, Charlene Brooke, NP  losartan (COZAAR) 100 MG tablet Take 1 tablet (100 mg total) by mouth daily. 02/14/21   Nche, Charlene Brooke, NP  metFORMIN (GLUCOPHAGE-XR) 500 MG 24 hr tablet TAKE 1 TABLET(500 MG) BY MOUTH DAILY WITH BREAKFAST Patient taking differently: Take 500 mg by mouth daily. 06/07/21   Nche, Charlene Brooke, NP  montelukast (SINGULAIR) 10 MG tablet Take 1 tablet (10 mg total) by mouth at bedtime. FOR SEASONAL ALLERGY Patient taking differently: Take 10 mg by mouth daily as needed (seasonal allergies). 10/21/20   Althea Charon, FNP  Multiple Vitamin (MULTIVITAMIN) capsule Take 1 capsule by mouth daily.    [provider]  NON FORMULARY 2 allergy injections every week    [provider]  Olopatadine HCl (PATADAY) 0.2 % SOLN Place 1 drop into both eyes daily as needed. 10/02/21   Dara Hoyer, FNP  pantoprazole (PROTONIX) 40 MG tablet Take 1 tablet (40 mg total) by mouth 2 (two) times daily. 09/22/21   Jackquline Denmark, MD  potassium chloride SA (KLOR-CON M) 20 MEQ tablet Take 1 tablet (20 mEq total) by mouth daily. 09/25/21 12/23/21  Jackquline Denmark, MD  PROAIR HFA 108 [phone] Base) MCG/ACT inhaler INHALE 2 PUFFS BY MOUTH EVERY 4 HOURS AS NEEDED FOR WHEEZING FOR SHORTNESS OF BREATH Patient taking differently: Inhale 2 puffs into the lungs every 4 (four) hours as needed for wheezing or shortness of breath. 11/23/20    Dara Hoyer, FNP  triamterene-hydrochlorothiazide (MAXZIDE)  75-50 MG tablet Take 0.5 tablets by mouth daily. 02/14/21   Nche, Charlene Brooke, NP    Blood pressure 113/83, pulse 100, temperature 97.8 F (36.6 C), temperature source Oral, resp. rate 18, height 5' (1.524 m), weight 102.5 kg, SpO2 91 %. Physical Exam: General: pleasant female female who is laying in bed an appears uncomfortable. HEENT: head is normocephalic, atraumatic. Sclera are anicteric. Pupils equal and round.   Heart: regular, rate, and rhythm.  Normal s1,s2. No obvious murmurs, gallops, or rubs noted.  Palpable pedal pulses bilaterally  Lungs: CTAB, no wheezes, rhonchi, or rales noted.  Respiratory effort nonlabored Abd: soft, obese, upper abdominal fullness with mild tenderness, no peritonitis, no masses, hernias, or organomegaly MS: no BUE/BLE edema, calves soft and nontender Skin: warm and dry with no masses, lesions, or rashes Psych: A&Ox4 with an appropriate affect Neuro: cranial nerves grossly intact, equal strength in BUE/BLE bilaterally, normal speech, thought process intact  Results for orders placed or performed during the hospital encounter of 11/01/21 (from the past 48 hour(s))  Lipase, blood     Status: None   Collection Time: 11/01/21  3:22 AM  Result Value Ref Range   Lipase 21 11 - 51 U/L    Comment: Performed at [facility], [address] [place], [place] [address]  Comprehensive metabolic panel     Status: Abnormal   Collection Time: 11/01/21  3:22 AM  Result Value Ref Range   Sodium 133 (L) 135 - 145 mmol/L   Potassium 4.1 3.5 - 5.1 mmol/L   Chloride 102 98 - 111 mmol/L   CO2 22 22 - 32 mmol/L   Glucose, Bld 128 (H) 70 - 99 mg/dL    Comment: Glucose reference range applies only to samples taken after fasting for at least 8 hours.   BUN 14 6 - 20 mg/dL   Creatinine, Ser 0.99 0.44 - 1.00 mg/dL   Calcium 9.8 8.9 - 10.3 mg/dL   Total Protein 8.1 6.5 - 8.1 g/dL   Albumin 4.1 3.5 - 5.0  g/dL   AST 21 15 - 41 U/L   ALT 15 0 - 44 U/L   Alkaline Phosphatase 56 38 - 126 U/L   Total Bilirubin 0.5 0.3 - 1.2 mg/dL   GFR, Estimated >60 >60 mL/min    Comment: (NOTE) Calculated using the CKD-EPI Creatinine Equation (2021)    Anion gap 9 5 - 15    Comment: Performed at [facility], [address] [place], [place] [address]  CBC     Status: Abnormal   Collection Time: 11/01/21  3:22 AM  Result Value Ref Range   WBC 7.5 4.0 - 10.5 K/uL   RBC 5.57 (H) 3.87 - 5.11 MIL/uL   Hemoglobin 11.7 (L) 12.0 - 15.0 g/dL   HCT 37.7 36.0 - 46.0 %   MCV 67.7 (L) 80.0 - 100.0 fL   MCH 21.0 (L) 26.0 - 34.0 pg   MCHC 31.0 30.0 - 36.0 g/dL   RDW 16.2 (H) 11.5 - 15.5 %   Platelets 355 150 - 400 K/uL   nRBC 0.0 0.0 - 0.2 %    Comment: Performed at [facility] [address] [place], [place] [address]  Resp Panel by RT-PCR (Flu A&B, Covid) Nasopharyngeal Swab     Status: None   Collection Time: 11/01/21  7:37 AM   Specimen: Nasopharyngeal Swab; Nasopharyngeal(NP) swabs in vial transport medium  Result Value Ref Range  SARS Coronavirus 2 by RT PCR NEGATIVE NEGATIVE    Comment: (NOTE) SARS-CoV-2 target nucleic acids are NOT DETECTED.  The SARS-CoV-2 RNA is generally detectable in upper respiratory specimens during the acute phase of infection. The lowest concentration of SARS-CoV-2 viral copies this assay can detect is 138 copies/mL. A negative result does not preclude SARS-Cov-2 infection and should not be used as the sole basis for treatment or other patient management decisions. A negative result may occur with  improper specimen collection/handling, submission of specimen other than nasopharyngeal swab, presence of viral mutation(s) within the areas targeted by this assay, and inadequate number of viral copies(<138 copies/mL). A negative result must be combined with clinical observations, patient history, and epidemiological information. The expected result is  Negative.  Fact Sheet for Patients:  EntrepreneurPulse.com.au  Fact Sheet for Healthcare Providers:  IncredibleEmployment.be  This test is no t yet approved or cleared by the [place] FDA and  has been authorized for detection and/or diagnosis of SARS-CoV-2 by FDA under an Emergency Use Authorization (EUA). This EUA will remain  in effect (meaning this test can be used) for the duration of the COVID-19 declaration under Section 564(b)(1) of the Act, 21 U.S.C.section 360bbb-3(b)(1), unless the authorization is terminated  or revoked sooner.       Influenza A by PCR NEGATIVE NEGATIVE   Influenza B by PCR NEGATIVE NEGATIVE    Comment: (NOTE) The Xpert Xpress SARS-CoV-2/FLU/RSV plus assay is intended as an aid in the diagnosis of influenza from Nasopharyngeal swab specimens and should not be used as a sole basis for treatment. Nasal washings and aspirates are unacceptable for Xpert Xpress SARS-CoV-2/FLU/RSV testing.  Fact Sheet for Patients: EntrepreneurPulse.com.au  Fact Sheet for Healthcare Providers: IncredibleEmployment.be  This test is not yet approved or cleared by the [place] FDA and has been authorized for detection and/or diagnosis of SARS-CoV-2 by FDA under an Emergency Use Authorization (EUA). This EUA will remain in effect (meaning this test can be used) for the duration of the COVID-19 declaration under Section 564(b)(1) of the Act, 21 U.S.C. section 360bbb-3(b)(1), unless the authorization is terminated or revoked.  Performed at [facility], [address] [place], [place] [address]   Urinalysis, Routine w reflex microscopic Urine, Clean Catch     Status: None   Collection Time: 11/01/21  7:50 AM  Result Value Ref Range   Color, Urine YELLOW YELLOW   APPearance CLEAR CLEAR   Specific Gravity, Urine 1.010 1.005 - 1.030   pH 5.0 5.0 - 8.0   Glucose, UA NEGATIVE  NEGATIVE mg/dL   Hgb urine dipstick NEGATIVE NEGATIVE   Bilirubin Urine NEGATIVE NEGATIVE   Ketones, ur NEGATIVE NEGATIVE mg/dL   Protein, ur NEGATIVE NEGATIVE mg/dL   Nitrite NEGATIVE NEGATIVE   Leukocytes,Ua NEGATIVE NEGATIVE    Comment: Microscopic not done on urines with negative protein, blood, leukocytes, nitrite, or glucose < 500 mg/dL. Performed at [facility], [address] [place], [place] [address]    DG Abdomen 1 View  Result Date: 11/01/2021 CLINICAL DATA:  61 year old female NG tube placement. EXAM: ABDOMEN - 1 VIEW COMPARISON:  CT Abdomen and Pelvis 0456 hours today. FINDINGS: Portable AP view at 0658 hours. Enteric tube placed into the stomach, side hole at the level of the gastric antrum. Stable cholecystectomy clips. Stable bowel gas pattern from the earlier CT. IMPRESSION: Satisfactory enteric tube placement into the stomach. Electronically Signed   By: Herminio Heads.D.  On: 11/01/2021 07:13   CT ABDOMEN PELVIS W CONTRAST  Result Date: 11/01/2021 CLINICAL DATA:  61 year old female with acute abdominal pain. Possible bowel obstruction. EXAM: CT ABDOMEN AND PELVIS WITH CONTRAST TECHNIQUE: Multidetector CT imaging of the abdomen and pelvis was performed using the standard protocol following bolus administration of intravenous contrast. RADIATION DOSE REDUCTION: This exam was performed according to the departmental dose-optimization program which includes automated exposure control, adjustment of the mA and/or kV according to patient size and/or use of iterative reconstruction technique. CONTRAST:  188m OMNIPAQUE IOHEXOL 300 MG/ML  SOLN COMPARISON:  CT Abdomen and Pelvis 09/28/2021 and earlier. FINDINGS: Lower chest: Cardiac size is stable at the upper limits of normal. Low lung volumes with mild lung base atelectasis. No pericardial or pleural effusion. Hepatobiliary: Chronically absent gallbladder. Stable liver including a small partially exophytic right lobe cyst  (series 3, image 16) since 2014 (benign). Pancreas: Negative. Spleen: Negative. Adrenals/Urinary Tract: Negative; small benign renal parapelvic cysts. Stomach/Bowel: Distal rectal anastomosis on series 3, image 70 with no adverse features. Upstream rectum and sigmoid colon are decompressed, negative. Decompressed descending colon and mildly redundant transverse colon. Negative right colon. Terminal ileum is decompressed and within normal limits on series 3, image 51. Decompressed small bowel loops in the right lower quadrant with a transition point in the midline suprapubic abdomen (series 3, image 62 and coronal image 52. Upstream dilated fluid containing small bowel, with flocculated material within a dilated 4 cm loop immediately upstream of a small bowel anastomosis near the transition on series 3, image 57. Small volume of free fluid in the small bowel mesentery there. Decompressed proximal jejunum with a gradual transition to the more distal dilated small bowel loops. Stomach and duodenum are within normal limits. No free air. Vascular/Lymphatic: Major arterial structures in the abdomen and pelvis are patent. Portal venous system is patent. No lymphadenopathy identified. Reproductive: Surgically absent uterus. Diminutive or absent ovaries. Other: No pelvic free fluid. Musculoskeletal: Chronic lower lumbar facet arthropathy. No acute osseous abnormality identified. IMPRESSION: 1. 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 Small volume of free fluid in the affected small bowel mesentery. No free air or other complicating features. 2. Otherwise stable abdomen and pelvis, including a rectal anastomosis with no adverse features. Electronically Signed   By: HGenevie AnnM.D.   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 No indication for acute surgical intervention. Will start the patient on the SBO protocol. We will follow.  ID - none VTE - SCDs, ok for chemical DVT prophylaxis from surgical standpoint FEN - IVF, NPO/NGT to LIWS Foley - none  HTN DM Asthma Obesity BMI 44.14  Low Medical Decision Making  EObie Dredge [facility] 11/01/2021, 10:32 AM Please see Amion for pager number during day hours 7:00am-4:30pm

## 2021-11-01 NOTE — Plan of Care (Signed)

## 2021-11-01 NOTE — ED Notes (Signed)
Report given to Hulan Fess, receiving nurse Dirk Dress

## 2021-11-01 NOTE — ED Notes (Signed)
ED Provider at bedside. 

## 2021-11-01 NOTE — Progress Notes (Signed)
Plan of Care Note for accepted transfer   Patient: Mackenzie Weber MRN: [id number]   [place]: 11/01/2021  Facility requesting transfer: [place] Requesting Provider: Dr. Dayna Barker Reason for transfer: Small Bowel Obstruction Facility course:   61 year old female with past medical history of moderate persistent asthma, diabetes mellitus type 2, gastroesophageal reflux disease, hypertension and multiple intra-abdominal surgeries including a history of small bowel resection with primary reanastomosis according to ER provider.  Patient presenting with a several hour history of sudden onset severe abdominal pain, mostly in the epigastric region.  ER provider reports abdomen is distended and somewhat tender.  Patient is nauseated but not vomiting on arrival to the emergency department.  CT imaging performed revealing evidence of small bowel obstruction.  NG tube placed in the emergency department and intravenous analgesics administered.  ER provider discussed case with Dr. Georgette Dover who placed patient on the list for surgical consultation while patient is on the hospital service.  Plan of care: The patient is accepted for admission to [place]  unit, at [facility]..    Author: Vernelle Emerald, MD 11/01/2021  Check [email or web address] for on-call coverage.  Nursing staff, Please call [place] number on Amion as soon as patient's arrival, so appropriate admitting provider can evaluate the pt.

## 2021-11-01 NOTE — ED Triage Notes (Signed)
Pt is c/o severe abd pain that started tonight around 8pm  Denies N/V/D  States was able to have a BM but it did not help  Pt states the pain is sharp in nature

## 2021-11-01 NOTE — Progress Notes (Signed)
°  Transition of Care [facility]) Screening Note   Patient Details  Name: Mackenzie Weber Date of Birth: [birth year]   Transition of Care [facility]) CM/SW Contact:    Lennart Pall, LCSW Phone Number: 11/01/2021, 11:17 AM    Transition of Care Department [facility]) has reviewed patient and no TOC needs have been identified at this time. We will continue to monitor patient advancement through interdisciplinary progression rounds. If new patient transition needs arise, please place a TOC consult.

## 2021-11-01 NOTE — H&P (Signed)
History and Physical    Mackenzie Weber EYC:[id number] DOB: 09/14/1961 DOA: 11/01/2021  PCP: Flossie Buffy, NP  Patient coming from: Home  Chief Complaint: stomach cramps  HPI: Mackenzie Weber is a 61 y.o. female with medical history significant of allergic rhinitis/asthma, HTN, DM2. Presenting with stomach pain. Symptoms started yesterday afternoon with severe abdominal cramps. They came in waves of variable length. She had a BM, but the cramping seemed to be worse after it. She thought it might be gas, so she took mylanta, pepto bismol, and gas-x. None of those helped. She denies N/V/D. Her pain worsened through the night, so she decided to come to the ED for help.     ED Course: Imaging showed SBO. General surgery was consulted. TRH was called for admission.   Review of Systems:  Denies CP, palpitations, dyspnea, N/V/D, fevers, sick contacts. Review of systems is otherwise negative for all not mentioned in HPI.   PMHx Past Medical History:  Diagnosis Date   Anemia    Asthma    Diabetes mellitus without complication (HCC)    Foot pain, right    Hypertension    IBS (irritable bowel syndrome)    Sciatica of left side    Seasonal allergies     PSHx Past Surgical History:  Procedure Laterality Date   ABDOMINAL HYSTERECTOMY     APPENDECTOMY     BLADDER SUSPENSION  2012   TVT   BREAST SURGERY  2001   /biopsy-benign   CHOLECYSTECTOMY     COLONOSCOPY  06/24/2019   [facility]   ESOPHAGOGASTRODUODENOSCOPY  05/12/2018   [facility]    EXCISIONAL HEMORRHOIDECTOMY     KNEE SURGERY     right   SHOULDER SURGERY Left 02/13/2018   Lipoma removed    Small Bowel Surgery     TONSILLECTOMY     TOTAL ABDOMINAL HYSTERECTOMY W/ BILATERAL SALPINGOOPHORECTOMY  1989   LSO-1987; RsO-1998   WRIST SURGERY     carpal tunnel repair    SocHx  reports that she has never smoked. She has never used smokeless tobacco. She reports that she does not drink alcohol and does not use  drugs.  Allergies  Allergen Reactions   Cortisone     Red area around injection site x 2mh   Depo-Medrol [Methylprednisolone Sodium Succ] Nausea Only and Other (See Comments)    Dizziness   Aspirin Anxiety and Other (See Comments)    Rapid Heart beat    FamHx Family History  Problem Relation Age of Onset   Diabetes Mother    Hypertension Mother    Asthma Mother    Heart disease Mother    Allergic rhinitis Mother    Diabetes Father    Hypertension Father    Heart disease Father    Hypertension Sister    Diabetes Sister    Cancer Sister 539      breast   Diabetes Brother    Hypertension Paternal Uncle    Colon cancer Maternal Uncle    Pancreatic cancer Neg Hx    Stomach cancer Neg Hx    Esophageal cancer Neg Hx    Liver disease Neg Hx     Prior to Admission medications   Medication Sig Start Date End Date Taking? Authorizing Provider  amoxicillin-clavulanate (AUGMENTIN) 875-125 MG tablet Take 1 tablet by mouth 2 (two) times daily for 10 days. 10/26/21 11/05/21  GValentina Shaggy MD  azelastine (ASTELIN) 0.1 % nasal spray 1-2 sprays  each nostril twice daily as needed 10/18/20   Althea Charon, FNP  blood glucose meter kit and supplies KIT Check blood sugar once daily. One Touch Verio Reflect Meter 06/30/19   Nche, Charlene Brooke, NP  Budeson-Glycopyrrol-Formoterol (BREZTRI AEROSPHERE) 160-9-4.8 MCG/ACT AERO Inhale 2 puffs into the lungs daily. Use with spacer. Rinse, gargle and spit out after use. 02/22/21   Valentina Shaggy, MD  budesonide (PULMICORT) 0.5 MG/2ML nebulizer solution Mix with Neilmed sinus rinse, use twice daily Patient not taking: Reported on 10/26/2021 10/23/21   Dara Hoyer, FNP  clarithromycin (BIAXIN) 500 MG tablet Take 1 tablet (500 mg total) by mouth 2 (two) times daily for 10 days. 10/26/21 11/05/21  Valentina Shaggy, MD  EPINEPHrine (AUVI-Q) 0.3 mg/0.3 mL IJ SOAJ injection Inject 0.3 mg into the muscle as needed for anaphylaxis. 10/18/20   Althea Charon, FNP  estradiol (ESTRACE) 0.1 MG/GM vaginal cream Place vaginally. 05/10/20   [provider]  famotidine (PEPCID) 20 MG tablet Take 1 tablet (20 mg total) by mouth at bedtime. 09/22/21   Jackquline Denmark, MD  Fluticasone Propionate Truett Perna) 93 MCG/ACT EXHU Place 2 sprays into the nose 2 (two) times daily as needed. 10/18/20   Althea Charon, FNP  glucose blood (ONETOUCH VERIO) test strip USE TO TEST BLOOD SUGAR ONCE DAILY 01/10/21   Nche, Charlene Brooke, NP  ipratropium (ATROVENT) 0.03 % nasal spray Place 2 sprays into both nostrils 3 (three) times daily. 09/19/21   Valentina Shaggy, MD  Lancets [facility] DELICA PLUS [id number]) MISC USE TO TEST BLOOD SUGAR ONCE DAILY 01/10/21   Nche, Charlene Brooke, NP  losartan (COZAAR) 100 MG tablet Take 1 tablet (100 mg total) by mouth daily. 02/14/21   Nche, Charlene Brooke, NP  metFORMIN (GLUCOPHAGE-XR) 500 MG 24 hr tablet TAKE 1 TABLET(500 MG) BY MOUTH DAILY WITH BREAKFAST 06/07/21   Nche, Charlene Brooke, NP  montelukast (SINGULAIR) 10 MG tablet Take 1 tablet (10 mg total) by mouth at bedtime. FOR SEASONAL ALLERGY Patient taking differently: Take 10 mg by mouth as needed. FOR SEASONAL ALLERGY 10/21/20   Althea Charon, FNP  Multiple Vitamin (MULTIVITAMIN) capsule Take 1 capsule by mouth daily.    [provider]  NON FORMULARY 2 allergy injections every week    [provider]  Olopatadine HCl (PATADAY) 0.2 % SOLN Place 1 drop into both eyes daily as needed. 10/02/21   Dara Hoyer, FNP  pantoprazole (PROTONIX) 40 MG tablet Take 1 tablet (40 mg total) by mouth 2 (two) times daily. 09/22/21   Jackquline Denmark, MD  potassium chloride SA (KLOR-CON M) 20 MEQ tablet Take 1 tablet (20 mEq total) by mouth daily. 09/25/21 10/26/21  Jackquline Denmark, MD  PROAIR HFA [phone] Base) MCG/ACT inhaler INHALE 2 PUFFS BY MOUTH EVERY 4 HOURS AS NEEDED FOR WHEEZING FOR SHORTNESS OF BREATH 11/23/20   Ambs, Kathrine Cords, FNP  triamterene-hydrochlorothiazide (MAXZIDE) 75-50  MG tablet Take 0.5 tablets by mouth daily. 02/14/21   Flossie Buffy, NP    Physical Exam: Vitals:   11/01/21 0619 11/01/21 0700 11/01/21 0730 11/01/21 0855  BP: (!) 149/87 136/78 128/76 113/83  Pulse: 68 75 63 100  Resp: 16  16 18   Temp:   97.7 F (36.5 C) 97.8 F (36.6 C)  TempSrc:   Oral Oral  SpO2: 100% 95% 96% 91%  Weight:      Height:        General: 61 y.o. female resting in bed in NAD Eyes: PERRL,  normal sclera ENMT: Nares patent w/o discharge, orophaynx clear, dentition normal, ears w/o discharge/lesions/ulcers Neck: Supple, trachea midline Cardiovascular: RRR, +S1, S2, no m/g/r, equal pulses throughout Respiratory: CTABL, no w/r/r, normal WOB GI: BS hypoactive, obese, global TTP but worse periumbilically, no masses noted, no organomegaly noted MSK: No e/c/c Neuro: A&O x 3, no focal deficits Psyc: Appropriate interaction and affect, calm/cooperative  Labs on Admission: I have personally reviewed following labs and imaging studies  CBC: Recent Labs  Lab 11/01/21 0322  WBC 7.5  HGB 11.7*  HCT 37.7  MCV 67.7*  PLT 332   Basic Metabolic Panel: Recent Labs  Lab 11/01/21 0322  NA 133*  K 4.1  CL 102  CO2 22  GLUCOSE 128*  BUN 14  CREATININE 0.99  CALCIUM 9.8   GFR: Estimated Creatinine Clearance: 66 mL/min (by C-G formula based on SCr of 0.99 mg/dL). Liver Function Tests: Recent Labs  Lab 11/01/21 0322  AST 21  ALT 15  ALKPHOS 56  BILITOT 0.5  PROT 8.1  ALBUMIN 4.1   Recent Labs  Lab 11/01/21 0322  LIPASE 21   No results for input(s): AMMONIA in the last 168 hours. Coagulation Profile: No results for input(s): INR, PROTIME in the last 168 hours. Cardiac Enzymes: No results for input(s): CKTOTAL, CKMB, CKMBINDEX, TROPONINI in the last 168 hours. BNP (last 3 results) No results for input(s): PROBNP in the last 8760 hours. HbA1C: No results for input(s): HGBA1C in the last 72 hours. CBG: No results for input(s): GLUCAP in the last  168 hours. Lipid Profile: No results for input(s): CHOL, HDL, LDLCALC, TRIG, CHOLHDL, LDLDIRECT in the last 72 hours. Thyroid Function Tests: No results for input(s): TSH, T4TOTAL, FREET4, T3FREE, THYROIDAB in the last 72 hours. Anemia Panel: No results for input(s): VITAMINB12, FOLATE, FERRITIN, TIBC, IRON, RETICCTPCT in the last 72 hours. Urine analysis:    Component Value Date/Time   COLORURINE YELLOW 11/01/2021 0750   APPEARANCEUR CLEAR 11/01/2021 0750   LABSPEC 1.010 11/01/2021 0750   PHURINE 5.0 11/01/2021 0750   GLUCOSEU NEGATIVE 11/01/2021 0750   HGBUR NEGATIVE 11/01/2021 0750   BILIRUBINUR NEGATIVE 11/01/2021 0750   KETONESUR NEGATIVE 11/01/2021 0750   PROTEINUR NEGATIVE 11/01/2021 0750   UROBILINOGEN 0.2 05/18/2013 1319   NITRITE NEGATIVE 11/01/2021 0750   LEUKOCYTESUR NEGATIVE 11/01/2021 0750    Radiological Exams on Admission: DG Abdomen 1 View  Result Date: 11/01/2021 CLINICAL DATA:  61 year old female NG tube placement. EXAM: ABDOMEN - 1 VIEW COMPARISON:  CT Abdomen and Pelvis 0456 hours today. FINDINGS: Portable AP view at 0658 hours. Enteric tube placed into the stomach, side hole at the level of the gastric antrum. Stable cholecystectomy clips. Stable bowel gas pattern from the earlier CT. IMPRESSION: Satisfactory enteric tube placement into the stomach. Electronically Signed   By: Genevie Ann M.D.   On: 11/01/2021 07:13   CT ABDOMEN PELVIS W CONTRAST  Result Date: 11/01/2021 CLINICAL DATA:  61 year old female with acute abdominal pain. Possible bowel obstruction. EXAM: CT ABDOMEN AND PELVIS WITH CONTRAST TECHNIQUE: Multidetector CT imaging of the abdomen and pelvis was performed using the standard protocol following bolus administration of intravenous contrast. RADIATION DOSE REDUCTION: This exam was performed according to the departmental dose-optimization program which includes automated exposure control, adjustment of the mA and/or kV according to patient size and/or  use of iterative reconstruction technique. CONTRAST:  137m OMNIPAQUE IOHEXOL 300 MG/ML  SOLN COMPARISON:  CT Abdomen and Pelvis 09/28/2021 and earlier. FINDINGS: Lower chest: Cardiac size is  stable at the upper limits of normal. Low lung volumes with mild lung base atelectasis. No pericardial or pleural effusion. Hepatobiliary: Chronically absent gallbladder. Stable liver including a small partially exophytic right lobe cyst (series 3, image 16) since 2014 (benign). Pancreas: Negative. Spleen: Negative. Adrenals/Urinary Tract: Negative; small benign renal parapelvic cysts. Stomach/Bowel: Distal rectal anastomosis on series 3, image 70 with no adverse features. Upstream rectum and sigmoid colon are decompressed, negative. Decompressed descending colon and mildly redundant transverse colon. Negative right colon. Terminal ileum is decompressed and within normal limits on series 3, image 51. Decompressed small bowel loops in the right lower quadrant with a transition point in the midline suprapubic abdomen (series 3, image 62 and coronal image 52. Upstream dilated fluid containing small bowel, with flocculated material within a dilated 4 cm loop immediately upstream of a small bowel anastomosis near the transition on series 3, image 57. Small volume of free fluid in the small bowel mesentery there. Decompressed proximal jejunum with a gradual transition to the more distal dilated small bowel loops. Stomach and duodenum are within normal limits. No free air. Vascular/Lymphatic: Major arterial structures in the abdomen and pelvis are patent. Portal venous system is patent. No lymphadenopathy identified. Reproductive: Surgically absent uterus. Diminutive or absent ovaries. Other: No pelvic free fluid. Musculoskeletal: Chronic lower lumbar facet arthropathy. No acute osseous abnormality identified. IMPRESSION: 1. Positive for Small-bowel Obstruction with transition point near a chronic mid small bowel anastomosis in the  lower abdomen. Small volume of free fluid in the affected small bowel mesentery. No free air or other complicating features. 2. Otherwise stable abdomen and pelvis, including a rectal anastomosis with no adverse features. Electronically Signed   By: Genevie Ann M.D.   On: 11/01/2021 05:41    EKG: Independently reviewed. Sinus, no st elevations  Assessment/Plan SBO Abdominal pain     - placed in inpt, med-surg     - CCS consulted, appreciate assistance; awaiting final recs     - NGT in place, continue SBO protocol     - fluids, anti-emetics, pain control  DM2     - NPO for now     - check A1c     - SSI, glucose checks  Microcytic anemia     - no evidence of bleed     - check iron studies  Asthma     - lung exam is good     - continue home regimen  HTN     - she is NPO for now     - will have PRN meds available; resume home regimen when able  DVT prophylaxis: SCDs  Code Status: FULL  Family Communication: None at bedside  Consults called: CCS   Status is: Inpatient  Remains inpatient appropriate because: severity of illness  Jonnie Finner DO [facility]  If 7PM-7AM, please contact night-coverage [email or web address]  11/01/2021, 9:06 AM

## 2021-11-01 NOTE — ED Provider Notes (Signed)
[facility] EMERGENCY DEPARTMENT Provider Note   CSN: [id number] Arrival date & time: 11/01/21  0303     History  Chief Complaint  Patient presents with   Abdominal Pain    Mackenzie Weber is a 61 y.o. female.  61 year old female with a past medical history of some type of bowel resection secondary to a complicated surgery, cholecystectomy who presents emerged from today with abdominal pain.  Patient states that for the last few hours she is a progressively worsening abdominal pain.  She states it feels like it is gas because she is been eating more fruits and vegetables recently.  She did have flatulence earlier which helped it for short period time but then he came back pretty quickly.  No burping.  No nausea or vomiting.  Her last bowel movement was around 8:00 in the evening and this was reported as normal.  No blood.  No fevers.  No trauma.  No recent illnesses.  Never had a bowel obstruction that she knows of.  Has indigestion but is been well controlled and no flareups recently   Abdominal Pain     Home Medications Prior to Admission medications   Medication Sig Start Date End Date Taking? Authorizing Provider  amoxicillin-clavulanate (AUGMENTIN) 875-125 MG tablet Take 1 tablet by mouth 2 (two) times daily for 10 days. 10/26/21 11/05/21  Valentina Shaggy, MD  azelastine (ASTELIN) 0.1 % nasal spray 1-2 sprays  each nostril twice daily as needed 10/18/20   Althea Charon, FNP  blood glucose meter kit and supplies KIT Check blood sugar once daily. One Touch Verio Reflect Meter 06/30/19   Nche, Charlene Brooke, NP  Budeson-Glycopyrrol-Formoterol (BREZTRI AEROSPHERE) 160-9-4.8 MCG/ACT AERO Inhale 2 puffs into the lungs daily. Use with spacer. Rinse, gargle and spit out after use. 02/22/21   Valentina Shaggy, MD  budesonide (PULMICORT) 0.5 MG/2ML nebulizer solution Mix with Neilmed sinus rinse, use twice daily Patient not taking: Reported on 10/26/2021 10/23/21   Dara Hoyer, FNP  clarithromycin (BIAXIN) 500 MG tablet Take 1 tablet (500 mg total) by mouth 2 (two) times daily for 10 days. 10/26/21 11/05/21  Valentina Shaggy, MD  EPINEPHrine (AUVI-Q) 0.3 mg/0.3 mL IJ SOAJ injection Inject 0.3 mg into the muscle as needed for anaphylaxis. 10/18/20   Althea Charon, FNP  estradiol (ESTRACE) 0.1 MG/GM vaginal cream Place vaginally. 05/10/20   [provider]  famotidine (PEPCID) 20 MG tablet Take 1 tablet (20 mg total) by mouth at bedtime. 09/22/21   Jackquline Denmark, MD  Fluticasone Propionate Truett Perna) 93 MCG/ACT EXHU Place 2 sprays into the nose 2 (two) times daily as needed. 10/18/20   Althea Charon, FNP  glucose blood (ONETOUCH VERIO) test strip USE TO TEST BLOOD SUGAR ONCE DAILY 01/10/21   Nche, Charlene Brooke, NP  ipratropium (ATROVENT) 0.03 % nasal spray Place 2 sprays into both nostrils 3 (three) times daily. 09/19/21   Valentina Shaggy, MD  Lancets [facility] DELICA PLUS [id number]) MISC USE TO TEST BLOOD SUGAR ONCE DAILY 01/10/21   Nche, Charlene Brooke, NP  losartan (COZAAR) 100 MG tablet Take 1 tablet (100 mg total) by mouth daily. 02/14/21   Nche, Charlene Brooke, NP  metFORMIN (GLUCOPHAGE-XR) 500 MG 24 hr tablet TAKE 1 TABLET(500 MG) BY MOUTH DAILY WITH BREAKFAST 06/07/21   Nche, Charlene Brooke, NP  montelukast (SINGULAIR) 10 MG tablet Take 1 tablet (10 mg total) by mouth at bedtime. FOR SEASONAL ALLERGY Patient taking differently: Take 10 mg by mouth as  needed. FOR SEASONAL ALLERGY 10/21/20   Althea Charon, FNP  Multiple Vitamin (MULTIVITAMIN) capsule Take 1 capsule by mouth daily.    [provider]  NON FORMULARY 2 allergy injections every week    [provider]  Olopatadine HCl (PATADAY) 0.2 % SOLN Place 1 drop into both eyes daily as needed. 10/02/21   Dara Hoyer, FNP  pantoprazole (PROTONIX) 40 MG tablet Take 1 tablet (40 mg total) by mouth 2 (two) times daily. 09/22/21   Jackquline Denmark, MD  potassium chloride SA (KLOR-CON M) 20 MEQ  tablet Take 1 tablet (20 mEq total) by mouth daily. 09/25/21 10/26/21  Jackquline Denmark, MD  PROAIR HFA 108 [phone] Base) MCG/ACT inhaler INHALE 2 PUFFS BY MOUTH EVERY 4 HOURS AS NEEDED FOR WHEEZING FOR SHORTNESS OF BREATH 11/23/20   Ambs, Kathrine Cords, FNP  triamterene-hydrochlorothiazide (MAXZIDE) 75-50 MG tablet Take 0.5 tablets by mouth daily. 02/14/21   Nche, Charlene Brooke, NP      Allergies    Cortisone, Depo-medrol [methylprednisolone sodium succ], and Aspirin    Review of Systems   Review of Systems  Gastrointestinal:  Positive for abdominal pain.  All other systems reviewed and are negative.  Physical Exam Updated Vital Signs BP (!) 159/91 (BP Location: Right Arm)    Pulse 73    Temp 98.1 F (36.7 C) (Oral)    Resp 18    Ht 5' (1.524 m)    Wt 102.5 kg    SpO2 99%    BMI 44.14 kg/m  Physical Exam Vitals and nursing note reviewed.  Constitutional:      Appearance: She is well-developed.  HENT:     Head: Normocephalic and atraumatic.     Mouth/Throat:     Mouth: Mucous membranes are moist.     Pharynx: Oropharynx is clear.  Eyes:     Pupils: Pupils are equal, round, and reactive to light.  Cardiovascular:     Rate and Rhythm: Normal rate and regular rhythm.  Pulmonary:     Effort: No respiratory distress.     Breath sounds: No stridor.  Abdominal:     General: Bowel sounds are normal. There is no distension.     Palpations: Abdomen is soft.     Tenderness: There is abdominal tenderness.  Musculoskeletal:        General: No swelling or tenderness. Normal range of motion.     Cervical back: Normal range of motion.  Skin:    General: Skin is warm and dry.  Neurological:     General: No focal deficit present.     Mental Status: She is alert.    ED Results / Procedures / Treatments   Labs (all labs ordered are listed, but only abnormal results are displayed) Labs Reviewed  COMPREHENSIVE METABOLIC PANEL - Abnormal; Notable for the following components:      Result Value   Sodium  133 (*)    Glucose, Bld 128 (*)    All other components within normal limits  CBC - Abnormal; Notable for the following components:   RBC 5.57 (*)    Hemoglobin 11.7 (*)    MCV 67.7 (*)    MCH 21.0 (*)    RDW 16.2 (*)    All other components within normal limits  LIPASE, BLOOD  URINALYSIS, ROUTINE W REFLEX MICROSCOPIC    EKG None  Radiology CT ABDOMEN PELVIS W CONTRAST  Result Date: 11/01/2021 CLINICAL DATA:  61 year old female with acute abdominal pain. Possible bowel obstruction. EXAM:  CT ABDOMEN AND PELVIS WITH CONTRAST TECHNIQUE: Multidetector CT imaging of the abdomen and pelvis was performed using the standard protocol following bolus administration of intravenous contrast. RADIATION DOSE REDUCTION: This exam was performed according to the departmental dose-optimization program which includes automated exposure control, adjustment of the mA and/or kV according to patient size and/or use of iterative reconstruction technique. CONTRAST:  163m OMNIPAQUE IOHEXOL 300 MG/ML  SOLN COMPARISON:  CT Abdomen and Pelvis 09/28/2021 and earlier. FINDINGS: Lower chest: Cardiac size is stable at the upper limits of normal. Low lung volumes with mild lung base atelectasis. No pericardial or pleural effusion. Hepatobiliary: Chronically absent gallbladder. Stable liver including a small partially exophytic right lobe cyst (series 3, image 16) since 2014 (benign). Pancreas: Negative. Spleen: Negative. Adrenals/Urinary Tract: Negative; small benign renal parapelvic cysts. Stomach/Bowel: Distal rectal anastomosis on series 3, image 70 with no adverse features. Upstream rectum and sigmoid colon are decompressed, negative. Decompressed descending colon and mildly redundant transverse colon. Negative right colon. Terminal ileum is decompressed and within normal limits on series 3, image 51. Decompressed small bowel loops in the right lower quadrant with a transition point in the midline suprapubic abdomen (series  3, image 62 and coronal image 52. Upstream dilated fluid containing small bowel, with flocculated material within a dilated 4 cm loop immediately upstream of a small bowel anastomosis near the transition on series 3, image 57. Small volume of free fluid in the small bowel mesentery there. Decompressed proximal jejunum with a gradual transition to the more distal dilated small bowel loops. Stomach and duodenum are within normal limits. No free air. Vascular/Lymphatic: Major arterial structures in the abdomen and pelvis are patent. Portal venous system is patent. No lymphadenopathy identified. Reproductive: Surgically absent uterus. Diminutive or absent ovaries. Other: No pelvic free fluid. Musculoskeletal: Chronic lower lumbar facet arthropathy. No acute osseous abnormality identified. IMPRESSION: 1. 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 Small volume of free fluid in the affected small bowel mesentery. No free air or other complicating features. 2. Otherwise stable abdomen and pelvis, including a rectal anastomosis with no adverse features. Electronically Signed   By: HGenevie AnnM.D.   On: 11/01/2021 05:41    Procedures Procedures    Medications Ordered in ED Medications  lactated ringers infusion (has no administration in time range)  HYDROmorphone (DILAUDID) injection 1 mg (1 mg Intravenous Given 11/01/21 0345)  iohexol (OMNIPAQUE) 300 MG/ML solution 100 mL (100 mLs Intravenous Contrast Given 11/01/21 0442)    ED Course/ Medical Decision Making/ A&P                           Medical Decision Making Amount and/or Complexity of Data Reviewed Labs: ordered. Radiology: ordered.  Risk Prescription drug management. Decision regarding hospitalization.   Could just be gas however with the extensive surgical history will evaluate for bowel obstruction and treat her symptomatically.  CT scan reviewed by myself and  interpreted by radiology whose interpretation I reviewed is consistent with small bowel obstruction.  Per radiology it does seem to be at the area of her previous anastomosis.  We will insert NG tube to help with pain relief.  She is not really vomiting right now.  We will discuss with hospitalist for admission   Final Clinical Impression(s) / ED Diagnoses Final diagnoses:  Small bowel obstruction ([place]    Rx / DC Orders ED Discharge Orders  None         Valery Amedee, Corene Cornea, MD 11/01/21 [phone]

## 2021-11-01 NOTE — Progress Notes (Signed)
Gastrografin administered at 1214. Xray notified for repeat scan in 8 hours.

## 2021-11-01 NOTE — ED Notes (Signed)
Pt resting comfortably at this time. Family at bedside

## 2021-11-01 NOTE — ED Notes (Signed)
Report given to [place] with [facility] [place]

## 2021-11-02 ENCOUNTER — Inpatient Hospital Stay (HOSPITAL_COMMUNITY): Payer: BC Managed Care – PPO

## 2021-11-02 LAB — CBC
HCT: 38.5 % (ref 36.0–46.0)
Hemoglobin: 11.9 g/dL — ABNORMAL LOW (ref 12.0–15.0)
MCH: 21.4 pg — ABNORMAL LOW (ref 26.0–34.0)
MCHC: 30.9 g/dL (ref 30.0–36.0)
MCV: 69.1 fL — ABNORMAL LOW (ref 80.0–100.0)
Platelets: 344 10*3/uL (ref 150–400)
RBC: 5.57 MIL/uL — ABNORMAL HIGH (ref 3.87–5.11)
RDW: 17.1 % — ABNORMAL HIGH (ref 11.5–15.5)
WBC: 6.1 10*3/uL (ref 4.0–10.5)
nRBC: 0 % (ref 0.0–0.2)

## 2021-11-02 LAB — COMPREHENSIVE METABOLIC PANEL
ALT: 14 U/L (ref 0–44)
AST: 17 U/L (ref 15–41)
Albumin: 4 g/dL (ref 3.5–5.0)
Alkaline Phosphatase: 53 U/L (ref 38–126)
Anion gap: 10 (ref 5–15)
BUN: 15 mg/dL (ref 6–20)
CO2: 26 mmol/L (ref 22–32)
Calcium: 9.9 mg/dL (ref 8.9–10.3)
Chloride: 100 mmol/L (ref 98–111)
Creatinine, Ser: 0.91 mg/dL (ref 0.44–1.00)
GFR, Estimated: >60 mL/min (ref >60)
Glucose, Bld: 126 mg/dL — ABNORMAL HIGH (ref 70–99)
Potassium: 3.4 mmol/L — ABNORMAL LOW (ref 3.5–5.1)
Sodium: 136 mmol/L (ref 135–145)
Total Bilirubin: 0.5 mg/dL (ref 0.3–1.2)
Total Protein: 7.7 g/dL (ref 6.5–8.1)

## 2021-11-02 LAB — GLUCOSE, CAPILLARY
Glucose-Capillary: 119 mg/dL — ABNORMAL HIGH (ref 70–99)
Glucose-Capillary: 114 mg/dL — ABNORMAL HIGH (ref 70–99)
Glucose-Capillary: 133 mg/dL — ABNORMAL HIGH (ref 70–99)
Glucose-Capillary: 136 mg/dL — ABNORMAL HIGH (ref 70–99)

## 2021-11-02 LAB — MAGNESIUM: Magnesium: 2.3 mg/dL (ref 1.7–2.4)

## 2021-11-02 MED ORDER — BUDESONIDE 0.25 MG/2ML IN SUSP
0.2500 mg | Freq: Two times a day (BID) | RESPIRATORY_TRACT | Status: DC
  Administered 2021-11-02 – 2021-11-08 (×12): 0.25 mg via RESPIRATORY_TRACT
  Filled 2021-11-02 (×13): qty 2

## 2021-11-02 MED ORDER — POTASSIUM CHLORIDE 10 MEQ/100ML IV SOLN
10.0000 meq | INTRAVENOUS | Status: AC
  Administered 2021-11-02 (×4): 10 meq via INTRAVENOUS
  Filled 2021-11-02: qty 100

## 2021-11-02 MED ORDER — UMECLIDINIUM BROMIDE 62.5 MCG/ACT IN AEPB
1.0000 | INHALATION_SPRAY | Freq: Every day | RESPIRATORY_TRACT | Status: DC
  Administered 2021-11-03 – 2021-11-08 (×6): 1 via RESPIRATORY_TRACT
  Filled 2021-11-02: qty 7

## 2021-11-02 MED ORDER — KCL IN DEXTROSE-NACL 20-5-0.9 MEQ/L-%-% IV SOLN
INTRAVENOUS | Status: DC
  Filled 2021-11-02 (×7): qty 1000

## 2021-11-02 MED ORDER — ARFORMOTEROL TARTRATE 15 MCG/2ML IN NEBU
15.0000 ug | INHALATION_SOLUTION | Freq: Two times a day (BID) | RESPIRATORY_TRACT | Status: DC
  Administered 2021-11-02 – 2021-11-08 (×12): 15 ug via RESPIRATORY_TRACT
  Filled 2021-11-02 (×14): qty 2

## 2021-11-02 MED ORDER — BUDESON-GLYCOPYRROL-FORMOTEROL 160-9-4.8 MCG/ACT IN AERO: 2.0000 | INHALATION_SPRAY | Freq: Every day | RESPIRATORY_TRACT | Status: DC

## 2021-11-02 NOTE — Progress Notes (Signed)
Subjective: Pain is better today, but still having crampy pain intermittently.  Had had a significant amount of NGT output since placement yesterday.  Had nausea with NGT clamped some this morning.  No flatus or BMs  ROS: See above, otherwise other systems negative  Objective: Vital signs in last 24 hours: Temp:  [98.4 F (36.9 C)-98.9 F (37.2 C)] 98.6 F (37 C) (01/19 0933) Pulse Rate:  [70-88] 88 (01/19 0933) Resp:  [14-17] 17 (01/19 0933) BP: (133-161)/(80-106) 161/106 (01/19 0933) SpO2:  [96 %-100 %] 99 % (01/19 0933) Last BM Date: 11/01/21  Intake/Output from previous day: 01/18 0701 - 01/19 0700 In: 1797.5 [I.V.:1797.5] Out: 1700 [Emesis/NG output:1700] Intake/Output this shift: Total I/O In: -  Out: 1200 [Emesis/NG output:1200]  PE: Heart: regular Lungs: CTAB Abd: soft, minimally tender, obese, mild distention, NGT in place with 2900cc of output since admission.  Lab Results:  Recent Labs    11/01/21 0322 11/02/21 0353  WBC 7.5 6.1  HGB 11.7* 11.9*  HCT 37.7 38.5  PLT 355 344   BMET Recent Labs    11/01/21 0322 11/02/21 0353  NA 133* 136  K 4.1 3.4*  CL 102 100  CO2 22 26  GLUCOSE 128* 126*  BUN 14 15  CREATININE 0.99 0.91  CALCIUM 9.8 9.9   PT/INR No results for input(s): LABPROT, INR in the last 72 hours. CMP     Component Value Date/Time   NA 136 11/02/2021 0353   NA 141 09/22/2021 1552   K 3.4 (L) 11/02/2021 0353   CL 100 11/02/2021 0353   CO2 26 11/02/2021 0353   GLUCOSE 126 (H) 11/02/2021 0353   BUN 15 11/02/2021 0353   BUN 18 09/22/2021 1552   CREATININE 0.91 11/02/2021 0353   CREATININE 0.89 04/15/2018 1430   CALCIUM 9.9 11/02/2021 0353   PROT 7.7 11/02/2021 0353   PROT 7.4 09/22/2021 1552   ALBUMIN 4.0 11/02/2021 0353   ALBUMIN 4.5 09/22/2021 1552   AST 17 11/02/2021 0353   ALT 14 11/02/2021 0353   ALKPHOS 53 11/02/2021 0353   BILITOT 0.5 11/02/2021 0353   BILITOT <0.2 09/22/2021 1552   GFRNONAA >60 11/02/2021  0353   GFRNONAA 58 (L) 03/18/2018 1357   GFRAA 67 03/18/2018 1357   Lipase     Component Value Date/Time   LIPASE 21 11/01/2021 0322       Studies/Results: DG Abdomen 1 View  Result Date: 11/01/2021 CLINICAL DATA:  61 year old female NG tube placement. EXAM: ABDOMEN - 1 VIEW COMPARISON:  CT Abdomen and Pelvis 0456 hours today. FINDINGS: Portable AP view at 0658 hours. Enteric tube placed into the stomach, side hole at the level of the gastric antrum. Stable cholecystectomy clips. Stable bowel gas pattern from the earlier CT. IMPRESSION: Satisfactory enteric tube placement into the stomach. Electronically Signed   By: Genevie Ann M.D.   On: 11/01/2021 07:13   CT ABDOMEN PELVIS W CONTRAST  Result Date: 11/01/2021 CLINICAL DATA:  61 year old female with acute abdominal pain. Possible bowel obstruction. EXAM: CT ABDOMEN AND PELVIS WITH CONTRAST TECHNIQUE: Multidetector CT imaging of the abdomen and pelvis was performed using the standard protocol following bolus administration of intravenous contrast. RADIATION DOSE REDUCTION: This exam was performed according to the departmental dose-optimization program which includes automated exposure control, adjustment of the mA and/or kV according to patient size and/or use of iterative reconstruction technique. CONTRAST:  126mL OMNIPAQUE IOHEXOL 300 MG/ML  SOLN COMPARISON:  CT Abdomen and Pelvis  09/28/2021 and earlier. FINDINGS: Lower chest: Cardiac size is stable at the upper limits of normal. Low lung volumes with mild lung base atelectasis. No pericardial or pleural effusion. Hepatobiliary: Chronically absent gallbladder. Stable liver including a small partially exophytic right lobe cyst (series 3, image 16) since 2014 (benign). Pancreas: Negative. Spleen: Negative. Adrenals/Urinary Tract: Negative; small benign renal parapelvic cysts. Stomach/Bowel: Distal rectal anastomosis on series 3, image 70 with no adverse features. Upstream rectum and sigmoid colon are  decompressed, negative. Decompressed descending colon and mildly redundant transverse colon. Negative right colon. Terminal ileum is decompressed and within normal limits on series 3, image 51. Decompressed small bowel loops in the right lower quadrant with a transition point in the midline suprapubic abdomen (series 3, image 62 and coronal image 52. Upstream dilated fluid containing small bowel, with flocculated material within a dilated 4 cm loop immediately upstream of a small bowel anastomosis near the transition on series 3, image 57. Small volume of free fluid in the small bowel mesentery there. Decompressed proximal jejunum with a gradual transition to the more distal dilated small bowel loops. Stomach and duodenum are within normal limits. No free air. Vascular/Lymphatic: Major arterial structures in the abdomen and pelvis are patent. Portal venous system is patent. No lymphadenopathy identified. Reproductive: Surgically absent uterus. Diminutive or absent ovaries. Other: No pelvic free fluid. Musculoskeletal: Chronic lower lumbar facet arthropathy. No acute osseous abnormality identified. IMPRESSION: 1. 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 Small volume of free fluid in the affected small bowel mesentery. No free air or other complicating features. 2. Otherwise stable abdomen and pelvis, including a rectal anastomosis with no adverse features. Electronically Signed   By: Genevie Ann M.D.   On: 11/01/2021 05:41   DG Abd Portable 1V  Result Date: 11/02/2021 CLINICAL DATA:  Abdominal pain and discomfort. EXAM: PORTABLE ABDOMEN - 1 VIEW COMPARISON:  11/01/2021 FINDINGS: NG tube extends the stomach. Unchanged from prior. Loops of small bowel measure up to 2.8 cm which is within normal limits. Cholecystectomy clips in the RIGHT upper quadrant IMPRESSION: NG tube in stomach.  Bowel gas pattern within normal limits. Electronically  Signed   By: Suzy Bouchard M.D.   On: 11/02/2021 10:12   DG Abd Portable 1V-Small Bowel Obstruction Protocol-initial, 8 hr delay  Result Date: 11/01/2021 CLINICAL DATA:  Small bowel protocol 8 hour postcontrast film. EXAM: PORTABLE ABDOMEN - 1 VIEW COMPARISON:  Abdominal x-ray 11/01/2021. CT abdomen and pelvis 11/01/2021. FINDINGS: Enteric tube tip is in the distal stomach. There are mildly dilated central small bowel loops. Oral contrast is seen in mid small bowel, but not within the colon or distal small bowel. There is a small amount of air in the colon. Contrast is also seen within the bladder. IMPRESSION: Stable dilated small bowel worrisome for small bowel obstruction. Contrast reaches mid small bowel. Electronically Signed   By: Ronney Asters M.D.   On: 11/01/2021 20:24    Anti-infectives: Anti-infectives (From admission, onward)    None        Assessment/Plan SBO - NGT with significant output still and no bowel function. -x-ray reviewed this morning and contrast remains in SB. -pain is improved -plan for repeat film in am.  If no improvement by tomorrow would plan for laparotomy. -labs, vitals, imaging, progress notes, reviewed for last 24 hrs -replace K due to hypokalemia, likely lost from high NGT output (primary already did this)  ID - none VTE - SCDs, ok for chemical DVT prophylaxis from surgical standpoint FEN - IVF, NPO/NGT to LIWS Foley - none   HTN DM Asthma Obesity BMI 44.14  Moderate Medical Decision Making  LOS: 1 day    Henreitta Cea , [facility] Surgery 11/02/2021, 10:29 AM Please see Amion for pager number during day hours 7:00am-4:30pm or 7:00am -11:30am on weekends

## 2021-11-02 NOTE — Progress Notes (Signed)
Mackenzie Weber  LFY:[id number] DOB: 17-Apr-1961 DOA: 11/01/2021 PCP: Flossie Buffy, NP    Brief Narrative:  [phone] with a history of IBS, HTN, L salpingo/oophorectomy complicated by small bowel injury 1997, and DM2 who presented to the ED with acute onset of severe generalized abdominal cramps not improving with OTC treatments.  In the ED imaging noted evidence of an SBO.  Consultants:  Gen Surgery   Code Status: FULL CODE  Antimicrobials:  none  DVT prophylaxis: SCDs  Interim Hx: Afebrile.  Vital signs stable.  Saturation 99% on room air.  No acute events reported overnight.  Mild hypokalemia noted.  Appears miserable at time of my visit with ongoing cramping abdominal pain and nausea.  NG tube continues to produce significant bilious fluid.  Alert and conversant.  Denies shortness of breath or chest pain.  Assessment & Plan:  SBO Gen Surgery following - NG in place -continue supportive care  Hypokalemia Due to poor intake -supplement -magnesium at target  DM2 CBG well controlled at present - A1c 6.7  Microcytic anemia anemia panel without evidence of severe iron deficiency but all indices borderline/low normal  Asthma Well compensated at present  HTN Well-controlled at present  Obesity - Body mass index is 44.14 kg/m.   Family Communication: Spoke with patient and family at bedside Disposition: From home - anticipate eventual return to home  Objective: Blood pressure 133/80, pulse 73, temperature 98.4 F (36.9 C), temperature source Oral, resp. rate 16, height 5' (1.524 m), weight 102.5 kg, SpO2 99 %.  Intake/Output Summary (Last 24 hours) at 11/02/2021 0902 Last data filed at 11/02/2021 0600 Gross per 24 hour  Intake 1645 ml  Output 1700 ml  Net -55 ml   Filed Weights   11/01/21 0312  Weight: 102.5 kg    Examination: General: No acute respiratory distress Lungs: Clear to auscultation bilaterally without wheezes or crackles Cardiovascular:  Regular rate and rhythm without murmur gallop or rub normal S1 and S2 Abdomen: Protuberant, soft, no rebound, bowel sounds hypoactive, no appreciable mass Extremities: No significant cyanosis, clubbing, or edema bilateral lower extremities  CBC: Recent Labs  Lab 11/01/21 0322 11/02/21 0353  WBC 7.5 6.1  HGB 11.7* 11.9*  HCT 37.7 38.5  MCV 67.7* 69.1*  PLT 355 782   Basic Metabolic Panel: Recent Labs  Lab 11/01/21 0322 11/02/21 0353  NA 133* 136  K 4.1 3.4*  CL 102 100  CO2 22 26  GLUCOSE 128* 126*  BUN 14 15  CREATININE 0.99 0.91  CALCIUM 9.8 9.9   GFR: Estimated Creatinine Clearance: 71.8 mL/min (by C-G formula based on SCr of 0.91 mg/dL).  Liver Function Tests: Recent Labs  Lab 11/01/21 0322 11/02/21 0353  AST 21 17  ALT 15 14  ALKPHOS 56 53  BILITOT 0.5 0.5  PROT 8.1 7.7  ALBUMIN 4.1 4.0   Recent Labs  Lab 11/01/21 0322  LIPASE 21    HbA1C: Hemoglobin A1C  Date/Time Value Ref Range Status  03/11/2019 12:00 AM 6.5  Final   Hgb A1c MFr Bld  Date/Time Value Ref Range Status  11/01/2021 10:47 AM 6.7 (H) 4.8 - 5.6 % Final    Comment:    (NOTE) Pre diabetes:          5.7%-6.4%  Diabetes:              >6.4%  Glycemic control for   <7.0% adults with diabetes   02/14/2021 10:03 AM 6.8 (H) 4.6 - 6.5 %  Final    Comment:    Glycemic Control Guidelines for People with Diabetes:Non Diabetic:  <6%Goal of Therapy: <7%Additional Action Suggested:  >8%     CBG: Recent Labs  Lab 11/01/21 1120 11/01/21 1711 11/01/21 2041 11/02/21 0746  GLUCAP 117* 114* 126* 119*    Recent Results (from the past 240 hour(s))  Resp Panel by RT-PCR (Flu A&B, Covid) Nasopharyngeal Swab     Status: None   Collection Time: 11/01/21  7:37 AM   Specimen: Nasopharyngeal Swab; Nasopharyngeal(NP) swabs in vial transport medium  Result Value Ref Range Status   SARS Coronavirus 2 by RT PCR NEGATIVE NEGATIVE Final    Comment: (NOTE) SARS-CoV-2 target nucleic acids are NOT  DETECTED.  The SARS-CoV-2 RNA is generally detectable in upper respiratory specimens during the acute phase of infection. The lowest concentration of SARS-CoV-2 viral copies this assay can detect is 138 copies/mL. A negative result does not preclude SARS-Cov-2 infection and should not be used as the sole basis for treatment or other patient management decisions. A negative result may occur with  improper specimen collection/handling, submission of specimen other than nasopharyngeal swab, presence of viral mutation(s) within the areas targeted by this assay, and inadequate number of viral copies(<138 copies/mL). A negative result must be combined with clinical observations, patient history, and epidemiological information. The expected result is Negative.  Fact Sheet for Patients:  EntrepreneurPulse.com.au  Fact Sheet for Healthcare Providers:  IncredibleEmployment.be  This test is no t yet approved or cleared by the [place] FDA and  has been authorized for detection and/or diagnosis of SARS-CoV-2 by FDA under an Emergency Use Authorization (EUA). This EUA will remain  in effect (meaning this test can be used) for the duration of the COVID-19 declaration under Section 564(b)(1) of the Act, 21 U.S.C.section 360bbb-3(b)(1), unless the authorization is terminated  or revoked sooner.       Influenza A by PCR NEGATIVE NEGATIVE Final   Influenza B by PCR NEGATIVE NEGATIVE Final    Comment: (NOTE) The Xpert Xpress SARS-CoV-2/FLU/RSV plus assay is intended as an aid in the diagnosis of influenza from Nasopharyngeal swab specimens and should not be used as a sole basis for treatment. Nasal washings and aspirates are unacceptable for Xpert Xpress SARS-CoV-2/FLU/RSV testing.  Fact Sheet for Patients: EntrepreneurPulse.com.au  Fact Sheet for Healthcare Providers: IncredibleEmployment.be  This test is not yet  approved or cleared by the [place] FDA and has been authorized for detection and/or diagnosis of SARS-CoV-2 by FDA under an Emergency Use Authorization (EUA). This EUA will remain in effect (meaning this test can be used) for the duration of the COVID-19 declaration under Section 564(b)(1) of the Act, 21 U.S.C. section 360bbb-3(b)(1), unless the authorization is terminated or revoked.  Performed at [facility], [address] [place], [place] [address]      Scheduled Meds:  insulin aspart  0-9 Units Subcutaneous TID WC   Continuous Infusions:  lactated ringers 100 mL/hr at 11/02/21 0243     LOS: 1 day   Cherene Altes, MD [facility] Office  [phone] Pager - Text Page per Shea Evans  If 7PM-7AM, please contact night-coverage per Amion 11/02/2021, 9:02 AM

## 2021-11-03 ENCOUNTER — Inpatient Hospital Stay (HOSPITAL_COMMUNITY): Payer: BC Managed Care – PPO

## 2021-11-03 LAB — CBC
HCT: 37.4 % (ref 36.0–46.0)
Hemoglobin: 11.3 g/dL — ABNORMAL LOW (ref 12.0–15.0)
MCH: 21.2 pg — ABNORMAL LOW (ref 26.0–34.0)
MCHC: 30.2 g/dL (ref 30.0–36.0)
MCV: 70.2 fL — ABNORMAL LOW (ref 80.0–100.0)
Platelets: 339 10*3/uL (ref 150–400)
RBC: 5.33 MIL/uL — ABNORMAL HIGH (ref 3.87–5.11)
RDW: 16.5 % — ABNORMAL HIGH (ref 11.5–15.5)
WBC: 7.9 10*3/uL (ref 4.0–10.5)
nRBC: 0 % (ref 0.0–0.2)

## 2021-11-03 LAB — BASIC METABOLIC PANEL
Anion gap: 7 (ref 5–15)
BUN: 13 mg/dL (ref 6–20)
CO2: 25 mmol/L (ref 22–32)
Calcium: 9 mg/dL (ref 8.9–10.3)
Chloride: 106 mmol/L (ref 98–111)
Creatinine, Ser: 1 mg/dL (ref 0.44–1.00)
GFR, Estimated: >60 mL/min (ref >60)
Glucose, Bld: 130 mg/dL — ABNORMAL HIGH (ref 70–99)
Potassium: 3.8 mmol/L (ref 3.5–5.1)
Sodium: 138 mmol/L (ref 135–145)

## 2021-11-03 LAB — GLUCOSE, CAPILLARY
Glucose-Capillary: 132 mg/dL — ABNORMAL HIGH (ref 70–99)
Glucose-Capillary: 173 mg/dL — ABNORMAL HIGH (ref 70–99)
Glucose-Capillary: 124 mg/dL — ABNORMAL HIGH (ref 70–99)
Glucose-Capillary: 150 mg/dL — ABNORMAL HIGH (ref 70–99)

## 2021-11-03 MED ORDER — MENTHOL 3 MG MT LOZG
1.0000 | LOZENGE | OROMUCOSAL | Status: DC | PRN
  Filled 2021-11-03: qty 9

## 2021-11-03 MED ORDER — BISACODYL 10 MG RE SUPP
10.0000 mg | Freq: Once | RECTAL | Status: AC
  Administered 2021-11-03: 10 mg via RECTAL
  Filled 2021-11-03: qty 1

## 2021-11-03 MED ORDER — POTASSIUM CHLORIDE 10 MEQ/100ML IV SOLN
10.0000 meq | INTRAVENOUS | Status: AC
  Administered 2021-11-03 (×3): 10 meq via INTRAVENOUS
  Filled 2021-11-03 (×3): qty 100

## 2021-11-03 MED ORDER — GUAIFENESIN ER 600 MG PO TB12
600.0000 mg | ORAL_TABLET | Freq: Two times a day (BID) | ORAL | Status: DC
  Filled 2021-11-03: qty 1

## 2021-11-03 NOTE — Progress Notes (Addendum)
Patient ID: Mackenzie Weber, female   DOB: 28-Jul-1961, 61 y.o.   MRN: [id number] [facility] Surgery Progress Note     Subjective: CC-  Standing up by the bed. States that she does feel better than yesterday. She reports less abdominal pain. Denies any bloating. No flatus or BM. NG with 2.3L bilious output. WBC WNL, VSS. Xray shows contrast in colon and decreased small bowel distension.  Objective: Vital signs in last 24 hours: Temp:  [97.9 F (36.6 C)-98.8 F (37.1 C)] 97.9 F (36.6 C) (01/20 0533) Pulse Rate:  [81-98] 98 (01/20 0533) Resp:  [16-17] 17 (01/20 0533) BP: (135-153)/(72-93) 135/72 (01/20 0533) SpO2:  [96 %-100 %] 100 % (01/20 0533) Last BM Date: 11/01/21  Intake/Output from previous day: 01/19 0701 - 01/20 0700 In: 1775 [I.V.:1375; IV Piggyback:400] Out: 2300 [Emesis/NG output:2300] Intake/Output this shift: No intake/output data recorded.  PE: Gen:  Alert, NAD, pleasant Pulm:  rate and effort normal Abd: soft, ?mild distension, nontender, obese, NGT in place with 2300cc of bilious drainage over the last 24 hours  Lab Results:  Recent Labs    11/02/21 0353 11/03/21 0356  WBC 6.1 7.9  HGB 11.9* 11.3*  HCT 38.5 37.4  PLT 344 339   BMET Recent Labs    11/02/21 0353 11/03/21 0356  NA 136 138  K 3.4* 3.8  CL 100 106  CO2 26 25  GLUCOSE 126* 130*  BUN 15 13  CREATININE 0.91 1.00  CALCIUM 9.9 9.0   PT/INR No results for input(s): LABPROT, INR in the last 72 hours. CMP     Component Value Date/Time   NA 138 11/03/2021 0356   NA 141 09/22/2021 1552   K 3.8 11/03/2021 0356   CL 106 11/03/2021 0356   CO2 25 11/03/2021 0356   GLUCOSE 130 (H) 11/03/2021 0356   BUN 13 11/03/2021 0356   BUN 18 09/22/2021 1552   CREATININE 1.00 11/03/2021 0356   CREATININE 0.89 04/15/2018 1430   CALCIUM 9.0 11/03/2021 0356   PROT 7.7 11/02/2021 0353   PROT 7.4 09/22/2021 1552   ALBUMIN 4.0 11/02/2021 0353   ALBUMIN 4.5 09/22/2021 1552   AST 17 11/02/2021  0353   ALT 14 11/02/2021 0353   ALKPHOS 53 11/02/2021 0353   BILITOT 0.5 11/02/2021 0353   BILITOT <0.2 09/22/2021 1552   GFRNONAA >60 11/03/2021 0356   GFRNONAA 58 (L) 03/18/2018 1357   GFRAA 67 03/18/2018 1357   Lipase     Component Value Date/Time   LIPASE 21 11/01/2021 0322       Studies/Results: DG Abd Portable 1V  Result Date: 11/03/2021 CLINICAL DATA:  Small-bowel obstruction EXAM: PORTABLE ABDOMEN - 1 VIEW COMPARISON:  Portable exam 0450 hours compared to 11/02/2021 FINDINGS: Oral contrast present in colon. Decreased small bowel distension. Minimal linear subsegmental atelectasis LEFT lower lobe. Nasogastric tube projects over stomach with tip at antrum. Surgical clips RIGHT upper quadrant likely reflect prior cholecystectomy. IMPRESSION: Decreased small bowel distension Electronically Signed   By: Lavonia Dana M.D.   On: 11/03/2021 08:08   DG Abd Portable 1V  Result Date: 11/02/2021 CLINICAL DATA:  Abdominal pain and discomfort. EXAM: PORTABLE ABDOMEN - 1 VIEW COMPARISON:  11/01/2021 FINDINGS: NG tube extends the stomach. Unchanged from prior. Loops of small bowel measure up to 2.8 cm which is within normal limits. Cholecystectomy clips in the RIGHT upper quadrant IMPRESSION: NG tube in stomach.  Bowel gas pattern within normal limits. Electronically Signed   By: Suzy Bouchard  M.D.   On: 11/02/2021 10:12   DG Abd Portable 1V-Small Bowel Obstruction Protocol-initial, 8 hr delay  Result Date: 11/01/2021 CLINICAL DATA:  Small bowel protocol 8 hour postcontrast film. EXAM: PORTABLE ABDOMEN - 1 VIEW COMPARISON:  Abdominal x-ray 11/01/2021. CT abdomen and pelvis 11/01/2021. FINDINGS: Enteric tube tip is in the distal stomach. There are mildly dilated central small bowel loops. Oral contrast is seen in mid small bowel, but not within the colon or distal small bowel. There is a small amount of air in the colon. Contrast is also seen within the bladder. IMPRESSION: Stable dilated small  bowel worrisome for small bowel obstruction. Contrast reaches mid small bowel. Electronically Signed   By: Ronney Asters M.D.   On: 11/01/2021 20:24    Anti-infectives: Anti-infectives (From admission, onward)    None        Assessment/Plan SBO - NGT with significant output since placement 48 hours ago, and patient still has no bowel function although she has less abdominal pain - Xray today shows contrast in colon and decreased small bowel distension  - I'm concerned how much bilious output is still draining from NG, but patient is feeling better and xray looks better. Will discuss plan with MD of going to OR vs giving one more day of conservative management.   ADDENDUM: Discussed with MD. Will try clamping NG and let patient sip on clear liquids. If she becomes nauseated or has worsening abdominal pain/ nausea then return NG to LIWS. Also give dulcolax suppository.    ID - none VTE - SCDs, ok for chemical DVT prophylaxis from surgical standpoint FEN - IVF, NPO/NGT to LIWS Foley - none   HTN DM Asthma Obesity BMI 44.14  Straightforward Medical Decision Making   LOS: 2 days    Wellington Hampshire, [facility] Surgery 11/03/2021, 10:06 AM Please see Amion for pager number during day hours 7:00am-4:30pm

## 2021-11-03 NOTE — Plan of Care (Signed)
  Problem: Education: Goal: Knowledge of General Education information will improve Description: Including pain rating scale, medication(s)/side effects and non-pharmacologic comfort measures Outcome: Progressing   Problem: Clinical Measurements: Goal: Ability to maintain clinical measurements within normal limits will improve Outcome: Progressing   Problem: Clinical Measurements: Goal: Diagnostic test results will improve Outcome: Progressing   

## 2021-11-03 NOTE — Progress Notes (Signed)
Mackenzie Weber  OIN:[id number] DOB: Jul 12, 1961 DOA: 11/01/2021 PCP: Flossie Buffy, NP    Brief Narrative:  [phone] with a history of IBS, HTN, L salpingo/oophorectomy complicated by small bowel injury 1997, and DM2 who presented to the ED with acute onset of severe generalized abdominal cramps not improving with OTC treatments.  In the ED imaging noted evidence of an SBO.  Consultants:  Gen Surgery   Code Status: FULL CODE  Antimicrobials:  none  DVT prophylaxis: SCDs  Interim Hx: No acute events reported overnight.  Afebrile.  Vital signs stable.  CBG controlled.  Electrolytes balanced.  Hemoglobin stable.  KUB this morning noted decreased bowel distention as per my interpretation. Clinically improving. No new complaints.   Assessment & Plan:  SBO Gen Surgery following - NG in place - continue supportive care - making progress   Hypokalemia Due to poor intake -improving -continue to supplement with goal of 4.0 -magnesium is at goal  DM2 CBG well controlled at present - A1c 6.7  Microcytic anemia anemia panel without evidence of severe iron deficiency but all indices borderline/low normal - hemoglobin stable  Asthma Well compensated at present  HTN controlled at present  Obesity - Body mass index is 44.14 kg/m.   Family Communication: Spoke with patient and family at bedside Disposition: From home - anticipate eventual return to home  Objective: Blood pressure 135/72, pulse 98, temperature 97.9 F (36.6 C), resp. rate 17, height 5' (1.524 m), weight 102.5 kg, SpO2 100 %.  Intake/Output Summary (Last 24 hours) at 11/03/2021 0832 Last data filed at 11/03/2021 0600 Gross per 24 hour  Intake 1775 ml  Output 2300 ml  Net -525 ml    Filed Weights   11/01/21 0312  Weight: 102.5 kg    Examination: General: No acute respiratory distress - NG in place  Lungs: Clear to auscultation bilaterally - no wheezing  Cardiovascular: Regular rate and rhythm without  murmur gallop or rub  Abdomen: Protuberant, soft, no rebound, bowel sounds hypoactive Extremities: No significant edema bilateral lower extremities  CBC: Recent Labs  Lab 11/01/21 0322 11/02/21 0353 11/03/21 0356  WBC 7.5 6.1 7.9  HGB 11.7* 11.9* 11.3*  HCT 37.7 38.5 37.4  MCV 67.7* 69.1* 70.2*  PLT 355 344 283    Basic Metabolic Panel: Recent Labs  Lab 11/01/21 0322 11/02/21 0353 11/02/21 0356 11/03/21 0356  NA 133* 136  --  138  K 4.1 3.4*  --  3.8  CL 102 100  --  106  CO2 22 26  --  25  GLUCOSE 128* 126*  --  130*  BUN 14 15  --  13  CREATININE 0.99 0.91  --  1.00  CALCIUM 9.8 9.9  --  9.0  MG  --   --  2.3  --     GFR: Estimated Creatinine Clearance: 65.3 mL/min (by C-G formula based on SCr of 1 mg/dL).  Liver Function Tests: Recent Labs  Lab 11/01/21 0322 11/02/21 0353  AST 21 17  ALT 15 14  ALKPHOS 56 53  BILITOT 0.5 0.5  PROT 8.1 7.7  ALBUMIN 4.1 4.0    Recent Labs  Lab 11/01/21 0322  LIPASE 21     HbA1C: Hemoglobin A1C  Date/Time Value Ref Range Status  03/11/2019 12:00 AM 6.5  Final   Hgb A1c MFr Bld  Date/Time Value Ref Range Status  11/01/2021 10:47 AM 6.7 (H) 4.8 - 5.6 % Final    Comment:    (  NOTE) Pre diabetes:          5.7%-6.4%  Diabetes:              >6.4%  Glycemic control for   <7.0% adults with diabetes   02/14/2021 10:03 AM 6.8 (H) 4.6 - 6.5 % Final    Comment:    Glycemic Control Guidelines for People with Diabetes:Non Diabetic:  <6%Goal of Therapy: <7%Additional Action Suggested:  >8%     CBG: Recent Labs  Lab 11/02/21 0746 11/02/21 1123 11/02/21 1646 11/02/21 2125 11/03/21 0728  GLUCAP 119* 114* 133* 136* 132*      Scheduled Meds:  arformoterol  15 mcg Nebulization BID   And   umeclidinium bromide  1 puff Inhalation Daily   And   budesonide (PULMICORT) nebulizer solution  0.25 mg Nebulization BID   insulin aspart  0-9 Units Subcutaneous TID WC   Continuous Infusions:  dextrose 5 % and 0.9 %  NaCl with KCl 20 mEq/L 75 mL/hr at 11/03/21 0031     LOS: 2 days   Cherene Altes, MD [facility] Office  [phone] Pager - Text Page per Shea Evans  If 7PM-7AM, please contact night-coverage per Amion 11/03/2021, 8:32 AM

## 2021-11-04 ENCOUNTER — Inpatient Hospital Stay (HOSPITAL_COMMUNITY): Payer: BC Managed Care – PPO

## 2021-11-04 LAB — CBC
HCT: 35.5 % — ABNORMAL LOW (ref 36.0–46.0)
Hemoglobin: 10.6 g/dL — ABNORMAL LOW (ref 12.0–15.0)
MCH: 21.1 pg — ABNORMAL LOW (ref 26.0–34.0)
MCHC: 29.9 g/dL — ABNORMAL LOW (ref 30.0–36.0)
MCV: 70.7 fL — ABNORMAL LOW (ref 80.0–100.0)
Platelets: 324 10*3/uL (ref 150–400)
RBC: 5.02 MIL/uL (ref 3.87–5.11)
RDW: 16.5 % — ABNORMAL HIGH (ref 11.5–15.5)
WBC: 8.9 10*3/uL (ref 4.0–10.5)
nRBC: 0 % (ref 0.0–0.2)

## 2021-11-04 LAB — MAGNESIUM: Magnesium: 2.3 mg/dL (ref 1.7–2.4)

## 2021-11-04 LAB — BASIC METABOLIC PANEL
Anion gap: 6 (ref 5–15)
BUN: 8 mg/dL (ref 6–20)
CO2: 26 mmol/L (ref 22–32)
Calcium: 9.2 mg/dL (ref 8.9–10.3)
Chloride: 108 mmol/L (ref 98–111)
Creatinine, Ser: 0.97 mg/dL (ref 0.44–1.00)
GFR, Estimated: >60 mL/min (ref >60)
Glucose, Bld: 134 mg/dL — ABNORMAL HIGH (ref 70–99)
Potassium: 3.9 mmol/L (ref 3.5–5.1)
Sodium: 140 mmol/L (ref 135–145)

## 2021-11-04 LAB — GLUCOSE, CAPILLARY
Glucose-Capillary: 141 mg/dL — ABNORMAL HIGH (ref 70–99)
Glucose-Capillary: 133 mg/dL — ABNORMAL HIGH (ref 70–99)

## 2021-11-04 MED ORDER — GUAIFENESIN ER 600 MG PO TB12
600.0000 mg | ORAL_TABLET | Freq: Two times a day (BID) | ORAL | Status: DC
  Administered 2021-11-04 – 2021-11-05 (×4): 600 mg via ORAL
  Filled 2021-11-04 (×4): qty 1

## 2021-11-04 MED ORDER — ACETAMINOPHEN 325 MG PO TABS
650.0000 mg | ORAL_TABLET | Freq: Four times a day (QID) | ORAL | Status: DC | PRN
  Administered 2021-11-04 – 2021-11-05 (×2): 650 mg via ORAL
  Filled 2021-11-04 (×3): qty 2

## 2021-11-04 MED ORDER — TRAMADOL HCL 50 MG PO TABS
50.0000 mg | ORAL_TABLET | Freq: Four times a day (QID) | ORAL | Status: DC | PRN
  Administered 2021-11-06 – 2021-11-07 (×4): 50 mg via ORAL
  Filled 2021-11-04 (×4): qty 1

## 2021-11-04 MED ORDER — ENOXAPARIN SODIUM 40 MG/0.4ML IJ SOSY
40.0000 mg | PREFILLED_SYRINGE | INTRAMUSCULAR | Status: DC
  Administered 2021-11-05 – 2021-11-07 (×3): 40 mg via SUBCUTANEOUS
  Filled 2021-11-04 (×5): qty 0.4

## 2021-11-04 NOTE — Progress Notes (Signed)
°   11/04/21 1400  Mobility  Activity Ambulated with assistance in hallway  Level of Assistance Contact guard assist, steadying assist  Assistive Device Other (Comment) (IV pole)  Distance Ambulated (ft) 300 ft  Activity Response Tolerated well  $Mobility charge 1 Mobility   Pt agreeable to mobilize this afternoon. Ambulated about 322ft in hall with IV pole, tolerated well. No complaints. Left pt in chair with call bell at side.  [facility] Office: [phone]

## 2021-11-04 NOTE — Progress Notes (Signed)
MD notified of pt refusing SS insulin.

## 2021-11-04 NOTE — Progress Notes (Signed)
New order for Lovenox sq. Patient declines injection. Educated on Lovenox and Prevention of blood clots. Patient prefer to wear SCD, and ambulating as much as possible. Informed patient new order for Ultram and Tylenol for pain. Also informed of new orders to discontinue sliding scale insulin secondary to patient refusing insulin. She agrees with all new orders. Pain level decreased. Pain at acceptable pain level of 2 at this time.

## 2021-11-04 NOTE — Progress Notes (Signed)
Mackenzie Weber  ERX:[id number] DOB: May 14, 1961 DOA: 11/01/2021 PCP: Flossie Buffy, NP    Brief Narrative:  [phone] with a history of IBS, HTN, L salpingo/oophorectomy complicated by small bowel injury 1997, and DM2 who presented to the ED with acute onset of severe generalized abdominal cramps not improving with OTC treatments.  In the ED imaging noted evidence of an SBO.  Consultants:  Gen Surgery   Code Status: FULL CODE  Antimicrobials:  none  DVT prophylaxis: SCDs  Interim Hx: Blood chemistries at goal.  CBG controlled.  No acute events reported overnight. Still having abdom cramps/pain. No nausea w/ NG in place. No SOB or chest pain.   Assessment & Plan:  SBO Gen Surgery following - NG in place - continue supportive care - appears to be making slow progress   Hypokalemia Due to poor intake -supplemented to goal  DM2 CBG well controlled at present - A1c 6.7  Microcytic anemia anemia panel without evidence of severe iron deficiency but all indices borderline/low normal - hemoglobin stable  Asthma Well compensated at present  HTN controlled at present  Obesity - Body mass index is 44.14 kg/m.   Family Communication: Spoke with patient and family at bedside Disposition: From home - anticipate eventual return to home  Objective: Blood pressure 132/75, pulse 87, temperature 99.3 F (37.4 C), temperature source Oral, resp. rate 18, height 5' (1.524 m), weight 102.5 kg, SpO2 99 %.  Intake/Output Summary (Last 24 hours) at 11/04/2021 0921 Last data filed at 11/04/2021 3267 Gross per 24 hour  Intake 677.5 ml  Output 350 ml  Net 327.5 ml    Filed Weights   11/01/21 0312  Weight: 102.5 kg    Examination: General: No acute respiratory distress - NG in place  Lungs: Clear to auscultation bilaterally - no wheezing  Cardiovascular: Regular rate and rhythm without murmur gallop or rub  Abdomen: Protuberant, soft, no rebound, bowel sounds +, no mass   Extremities: No edema bilateral lower extremities  CBC: Recent Labs  Lab 11/02/21 0353 11/03/21 0356 11/04/21 0424  WBC 6.1 7.9 8.9  HGB 11.9* 11.3* 10.6*  HCT 38.5 37.4 35.5*  MCV 69.1* 70.2* 70.7*  PLT 344 339 124    Basic Metabolic Panel: Recent Labs  Lab 11/02/21 0353 11/02/21 0356 11/03/21 0356 11/04/21 0424  NA 136  --  138 140  K 3.4*  --  3.8 3.9  CL 100  --  106 108  CO2 26  --  25 26  GLUCOSE 126*  --  130* 134*  BUN 15  --  13 8  CREATININE 0.91  --  1.00 0.97  CALCIUM 9.9  --  9.0 9.2  MG  --  2.3  --  2.3    GFR: Estimated Creatinine Clearance: 67.3 mL/min (by C-G formula based on SCr of 0.97 mg/dL).  Liver Function Tests: Recent Labs  Lab 11/01/21 0322 11/02/21 0353  AST 21 17  ALT 15 14  ALKPHOS 56 53  BILITOT 0.5 0.5  PROT 8.1 7.7  ALBUMIN 4.1 4.0    Recent Labs  Lab 11/01/21 0322  LIPASE 21     HbA1C: Hemoglobin A1C  Date/Time Value Ref Range Status  03/11/2019 12:00 AM 6.5  Final   Hgb A1c MFr Bld  Date/Time Value Ref Range Status  11/01/2021 10:47 AM 6.7 (H) 4.8 - 5.6 % Final    Comment:    (NOTE) Pre diabetes:  5.7%-6.4%  Diabetes:              >6.4%  Glycemic control for   <7.0% adults with diabetes   02/14/2021 10:03 AM 6.8 (H) 4.6 - 6.5 % Final    Comment:    Glycemic Control Guidelines for People with Diabetes:Non Diabetic:  <6%Goal of Therapy: <7%Additional Action Suggested:  >8%     CBG: Recent Labs  Lab 11/03/21 0728 11/03/21 1154 11/03/21 1627 11/03/21 2110 11/04/21 0724  GLUCAP 132* 173* 124* 150* 141*      Scheduled Meds:  arformoterol  15 mcg Nebulization BID   And   umeclidinium bromide  1 puff Inhalation Daily   And   budesonide (PULMICORT) nebulizer solution  0.25 mg Nebulization BID   guaiFENesin  600 mg Oral BID   insulin aspart  0-9 Units Subcutaneous TID WC   Continuous Infusions:  dextrose 5 % and 0.9 % NaCl with KCl 20 mEq/L 75 mL/hr at 11/04/21 0234     LOS: 3  days   Cherene Altes, MD [facility] Office  [phone] Pager - Text Page per Shea Evans  If 7PM-7AM, please contact night-coverage per Amion 11/04/2021, 9:21 AM

## 2021-11-04 NOTE — Progress Notes (Signed)
° °  Subjective/Chief Complaint: Complains of soreness. States she is passing flatus. Ng has been clamped since yesterday   Objective: Vital signs in last 24 hours: Temp:  [98.1 F (36.7 C)-99.3 F (37.4 C)] 98.5 F (36.9 C) (01/21 1000) Pulse Rate:  [79-89] 79 (01/21 1000) Resp:  [17-18] 18 (01/21 1000) BP: (132-151)/(75-82) 140/82 (01/21 1000) SpO2:  [95 %-100 %] 100 % (01/21 1000) Last BM Date: 11/04/21  Intake/Output from previous day: 01/20 0701 - 01/21 0700 In: 677.5 [P.O.:120; I.V.:557.5] Out: 350 [Emesis/NG output:350] Intake/Output this shift: No intake/output data recorded.  General appearance: alert and cooperative Resp: clear to auscultation bilaterally Cardio: regular rate and rhythm GI: soft, obese. Mild tenderness  Lab Results:  Recent Labs    11/03/21 0356 11/04/21 0424  WBC 7.9 8.9  HGB 11.3* 10.6*  HCT 37.4 35.5*  PLT 339 324   BMET Recent Labs    11/03/21 0356 11/04/21 0424  NA 138 140  K 3.8 3.9  CL 106 108  CO2 25 26  GLUCOSE 130* 134*  BUN 13 8  CREATININE 1.00 0.97  CALCIUM 9.0 9.2   PT/INR No results for input(s): LABPROT, INR in the last 72 hours. ABG No results for input(s): PHART, HCO3 in the last 72 hours.  Invalid input(s): PCO2, PO2  Studies/Results: DG Abd Portable 1V  Result Date: 11/03/2021 CLINICAL DATA:  Small-bowel obstruction EXAM: PORTABLE ABDOMEN - 1 VIEW COMPARISON:  Portable exam 0450 hours compared to 11/02/2021 FINDINGS: Oral contrast present in colon. Decreased small bowel distension. Minimal linear subsegmental atelectasis LEFT lower lobe. Nasogastric tube projects over stomach with tip at antrum. Surgical clips RIGHT upper quadrant likely reflect prior cholecystectomy. IMPRESSION: Decreased small bowel distension Electronically Signed   By: Lavonia Dana M.D.   On: 11/03/2021 08:08    Anti-infectives: Anti-infectives (From admission, onward)    None       Assessment/Plan: s/p * No surgery found * Ng  has been clamped since yesterday. She still feels a little bloated. Will check abd xrays before thinking about removing it Sbo seems to be slowly improving Ambulate ID - none VTE - SCDs, ok for chemical DVT prophylaxis from surgical standpoint FEN - IVF, NPO/NGT to LIWS Foley - none   HTN DM Asthma Obesity BMI 44.14  LOS: 3 days    Mackenzie Weber 11/04/2021

## 2021-11-05 ENCOUNTER — Inpatient Hospital Stay (HOSPITAL_COMMUNITY): Payer: BC Managed Care – PPO

## 2021-11-05 MED ORDER — LOSARTAN POTASSIUM 50 MG PO TABS
100.0000 mg | ORAL_TABLET | Freq: Every day | ORAL | Status: DC
  Administered 2021-11-05 – 2021-11-08 (×4): 100 mg via ORAL
  Filled 2021-11-05 (×4): qty 2

## 2021-11-05 MED ORDER — OXYCODONE HCL 5 MG PO TABS
5.0000 mg | ORAL_TABLET | Freq: Four times a day (QID) | ORAL | Status: DC | PRN
  Administered 2021-11-05: 5 mg via ORAL
  Filled 2021-11-05: qty 1

## 2021-11-05 NOTE — Progress Notes (Signed)
° °  Subjective/Chief Complaint: Complains of pain from ng. Passing flatus and having bm's   Objective: Vital signs in last 24 hours: Temp:  [98.4 F (36.9 C)-99.1 F (37.3 C)] 99.1 F (37.3 C) (01/22 0454) Pulse Rate:  [50-84] 82 (01/22 0454) Resp:  [16-18] 18 (01/22 0454) BP: (117-155)/(59-84) 143/79 (01/22 0454) SpO2:  [95 %-100 %] 97 % (01/22 0454) Last BM Date: 11/04/21  Intake/Output from previous day: 01/21 0701 - 01/22 0700 In: 1881 [P.O.:360; I.V.:1521] Out: 200 [Urine:200] Intake/Output this shift: No intake/output data recorded.  General appearance: alert and cooperative Resp: clear to auscultation bilaterally Cardio: regular rate and rhythm GI: soft, mild distension  Lab Results:  Recent Labs    11/03/21 0356 11/04/21 0424  WBC 7.9 8.9  HGB 11.3* 10.6*  HCT 37.4 35.5*  PLT 339 324   BMET Recent Labs    11/03/21 0356 11/04/21 0424  NA 138 140  K 3.8 3.9  CL 106 108  CO2 25 26  GLUCOSE 130* 134*  BUN 13 8  CREATININE 1.00 0.97  CALCIUM 9.0 9.2   PT/INR No results for input(s): LABPROT, INR in the last 72 hours. ABG No results for input(s): PHART, HCO3 in the last 72 hours.  Invalid input(s): PCO2, PO2  Studies/Results: DG Abd 2 Views  Result Date: 11/04/2021 CLINICAL DATA:  Small-bowel obstruction EXAM: ABDOMEN - 2 VIEW COMPARISON:  Abdominal x-ray 11/03/2021 FINDINGS: Enteric tube tip is in the region of the distal stomach. Multiple mildly distended gas-filled loops of small bowel visualized mostly in the mid abdomen measuring up to 3.8 cm in diameter. Bowel gas is visualized within the colon. No suspicious calcifications visualized. IMPRESSION: Abnormal bowel-gas pattern again seen suggesting obstruction. Continued follow-up recommended. Electronically Signed   By: Ofilia Neas M.D.   On: 11/04/2021 14:10    Anti-infectives: Anti-infectives (From admission, onward)    None       Assessment/Plan: s/p * No surgery found  * Recheck abd xrays today. If not worse then will plan for d/c ng and allow clears Sbo clinically improving Ambulate ID - none VTE - SCDs, ok for chemical DVT prophylaxis from surgical standpoint FEN - IVF Foley - none   HTN DM Asthma Obesity BMI 44.14  LOS: 4 days    Mackenzie Weber 11/05/2021

## 2021-11-05 NOTE — Progress Notes (Signed)
Mackenzie Weber  XIP:[id number] DOB: June 09, 1961 DOA: 11/01/2021 PCP: Flossie Buffy, NP    Brief Narrative:  [phone] with a history of IBS, HTN, L salpingo/oophorectomy complicated by small bowel injury 1997, and DM2 who presented to the ED with acute onset of severe generalized abdominal cramps not improving with OTC treatments.  In the ED imaging noted evidence of an SBO.  Consultants:  Gen Surgery   Code Status: FULL CODE  Antimicrobials:  none  DVT prophylaxis: SCDs  Interim Hx: Afebrile.  Vitals are stable.  Passing flatus and having some limited bowel movements.  Surgery to reimage and consider pulling NG today.  Patient has refused Lovenox dosing as well as sliding scale insulin/CBG checks. No new complaints at time of exam.   Assessment & Plan:  SBO Gen Surgery following - continue supportive care - appears to be making slow progress   Hypokalemia Due to poor intake -supplemented to goal  DM2 CBG well controlled at present - A1c 6.7 -patient refusing insulin dosing or CBG checks - stop dextrose in IVF since pt will not allow CBG monitoring   Microcytic anemia anemia panel without evidence of severe iron deficiency but all indices borderline/low normal - hemoglobin stable  Asthma Well compensated at present  HTN Reasonably controlled at present  Obesity - Body mass index is 44.14 kg/m.   Family Communication: spoke w/ family at bedside  Disposition: From home - anticipate eventual return to home  Objective: Blood pressure (!) 143/79, pulse 82, temperature 99.1 F (37.3 C), temperature source Oral, resp. rate 18, height 5' (1.524 m), weight 102.5 kg, SpO2 97 %.  Intake/Output Summary (Last 24 hours) at 11/05/2021 0858 Last data filed at 11/05/2021 0600 Gross per 24 hour  Intake 1880.99 ml  Output 200 ml  Net 1680.99 ml    Filed Weights   11/01/21 0312  Weight: 102.5 kg    Examination: General: No acute respiratory distress Lungs: Clear to  auscultation bilaterally Cardiovascular: Regular rate and rhythm without murmur gallop or rub  Abdomen: Protuberant, soft, no rebound, BS+, no mass  Extremities: No edema B LE   CBC: Recent Labs  Lab 11/02/21 0353 11/03/21 0356 11/04/21 0424  WBC 6.1 7.9 8.9  HGB 11.9* 11.3* 10.6*  HCT 38.5 37.4 35.5*  MCV 69.1* 70.2* 70.7*  PLT 344 339 193    Basic Metabolic Panel: Recent Labs  Lab 11/02/21 0353 11/02/21 0356 11/03/21 0356 11/04/21 0424  NA 136  --  138 140  K 3.4*  --  3.8 3.9  CL 100  --  106 108  CO2 26  --  25 26  GLUCOSE 126*  --  130* 134*  BUN 15  --  13 8  CREATININE 0.91  --  1.00 0.97  CALCIUM 9.9  --  9.0 9.2  MG  --  2.3  --  2.3    GFR: Estimated Creatinine Clearance: 67.3 mL/min (by C-G formula based on SCr of 0.97 mg/dL).  Liver Function Tests: Recent Labs  Lab 11/01/21 0322 11/02/21 0353  AST 21 17  ALT 15 14  ALKPHOS 56 53  BILITOT 0.5 0.5  PROT 8.1 7.7  ALBUMIN 4.1 4.0    Recent Labs  Lab 11/01/21 0322  LIPASE 21     HbA1C: Hemoglobin A1C  Date/Time Value Ref Range Status  03/11/2019 12:00 AM 6.5  Final   Hgb A1c MFr Bld  Date/Time Value Ref Range Status  11/01/2021 10:47 AM 6.7 (H) 4.8 -  5.6 % Final    Comment:    (NOTE) Pre diabetes:          5.7%-6.4%  Diabetes:              >6.4%  Glycemic control for   <7.0% adults with diabetes   02/14/2021 10:03 AM 6.8 (H) 4.6 - 6.5 % Final    Comment:    Glycemic Control Guidelines for People with Diabetes:Non Diabetic:  <6%Goal of Therapy: <7%Additional Action Suggested:  >8%     CBG: Recent Labs  Lab 11/03/21 1154 11/03/21 1627 11/03/21 2110 11/04/21 0724 11/04/21 1122  GLUCAP 173* 124* 150* 141* 133*      Scheduled Meds:  arformoterol  15 mcg Nebulization BID   And   umeclidinium bromide  1 puff Inhalation Daily   And   budesonide (PULMICORT) nebulizer solution  0.25 mg Nebulization BID   enoxaparin (LOVENOX) injection  40 mg Subcutaneous Q24H    guaiFENesin  600 mg Oral BID   Continuous Infusions:  dextrose 5 % and 0.9 % NaCl with KCl 20 mEq/L 60 mL/hr at 11/05/21 0529     LOS: 4 days   Cherene Altes, MD [facility] Office  [phone] Pager - Text Page per Shea Evans  If 7PM-7AM, please contact night-coverage per Amion 11/05/2021, 8:58 AM

## 2021-11-06 MED ORDER — MORPHINE SULFATE (PF) 2 MG/ML IV SOLN: 1.0000 mg | INTRAVENOUS | Status: DC | PRN

## 2021-11-06 NOTE — Progress Notes (Signed)
Patient ID: Mackenzie Weber, female   DOB: [birth year], 61 y.o.   MRN: [id number] [facility] Surgery Progress Note     Subjective: Vomited a moderate amount yesterday after drinking some liquids after NGT removed, but feels a little better overall today.  Still some crampy pain after drinking.  Having some diarrhea  Objective: Vital signs in last 24 hours: Temp:  [98.3 F (36.8 C)-99.1 F (37.3 C)] 98.3 F (36.8 C) (01/23 0800) Pulse Rate:  [76-94] 82 (01/23 0800) Resp:  [14-18] 18 (01/23 0800) BP: (135-156)/(77-87) 135/78 (01/23 0800) SpO2:  [95 %-100 %] 98 % (01/23 0819) Last BM Date: 11/05/21  Intake/Output from previous day: 01/22 0701 - 01/23 0700 In: 1490 [P.O.:1490] Out: -  Intake/Output this shift: No intake/output data recorded.  PE: Gen:  Alert, NAD, pleasant Pulm:  rate and effort normal Abd: soft, mild epigastric tenderness but minimal, obese  Lab Results:  Recent Labs    11/04/21 0424  WBC 8.9  HGB 10.6*  HCT 35.5*  PLT 324   BMET Recent Labs    11/04/21 0424  NA 140  K 3.9  CL 108  CO2 26  GLUCOSE 134*  BUN 8  CREATININE 0.97  CALCIUM 9.2   PT/INR No results for input(s): LABPROT, INR in the last 72 hours. CMP     Component Value Date/Time   NA 140 11/04/2021 0424   NA 141 09/22/2021 1552   K 3.9 11/04/2021 0424   CL 108 11/04/2021 0424   CO2 26 11/04/2021 0424   GLUCOSE 134 (H) 11/04/2021 0424   BUN 8 11/04/2021 0424   BUN 18 09/22/2021 1552   CREATININE 0.97 11/04/2021 0424   CREATININE 0.89 04/15/2018 1430   CALCIUM 9.2 11/04/2021 0424   PROT 7.7 11/02/2021 0353   PROT 7.4 09/22/2021 1552   ALBUMIN 4.0 11/02/2021 0353   ALBUMIN 4.5 09/22/2021 1552   AST 17 11/02/2021 0353   ALT 14 11/02/2021 0353   ALKPHOS 53 11/02/2021 0353   BILITOT 0.5 11/02/2021 0353   BILITOT <0.2 09/22/2021 1552   GFRNONAA >60 11/04/2021 0424   GFRNONAA 58 (L) 03/18/2018 1357   GFRAA 67 03/18/2018 1357   Lipase     Component Value Date/Time    LIPASE 21 11/01/2021 0322       Studies/Results: DG Abd 2 Views  Result Date: 11/05/2021 CLINICAL DATA:  Small bowel obstruction. EXAM: ABDOMEN - 2 VIEW COMPARISON:  Abdominal radiograph 11/04/2021. FINDINGS: Left basilar atelectasis. Enteric tube tip and side-port project over the stomach. Persistent gaseous distended loops of small bowel within the central abdomen. There appears to be distal colonic gas. Lumbar spine degenerative changes. IMPRESSION: Findings compatible with small-bowel obstruction. Electronically Signed   By: Lovey Newcomer M.D.   On: 11/05/2021 12:41   DG Abd 2 Views  Result Date: 11/04/2021 CLINICAL DATA:  Small-bowel obstruction EXAM: ABDOMEN - 2 VIEW COMPARISON:  Abdominal x-ray 11/03/2021 FINDINGS: Enteric tube tip is in the region of the distal stomach. Multiple mildly distended gas-filled loops of small bowel visualized mostly in the mid abdomen measuring up to 3.8 cm in diameter. Bowel gas is visualized within the colon. No suspicious calcifications visualized. IMPRESSION: Abnormal bowel-gas pattern again seen suggesting obstruction. Continued follow-up recommended. Electronically Signed   By: Ofilia Neas M.D.   On: 11/04/2021 14:10    Anti-infectives: Anti-infectives (From admission, onward)    None        Assessment/Plan SBO - NGT out.  Had some vomiting yesterday, but  feels better than yesterday.  Will try CLD today.  If fails, plan for OR tomorrow, if not will try solid food -long discussion with patient and spouse and they understand and agree with this plan. -cont to mobilize -imaging, vitals, I&Os, chart for last 24-48 hrs reviewed   ID - none VTE - SCDs, ok for chemical DVT prophylaxis from surgical standpoint FEN - IVF, CLD Foley - none   HTN DM Asthma Obesity BMI 44.14  Moderate Medical Decision Making   LOS: 5 days    Henreitta Cea, [facility] Surgery 11/06/2021, 10:36 AM Please see Amion for pager number during  day hours 7:00am-4:30pm

## 2021-11-06 NOTE — Progress Notes (Signed)
Mackenzie Weber  KYH:[id number] DOB: 06-27-1961 DOA: 11/01/2021 PCP: Flossie Buffy, NP    Brief Narrative:  [phone] with a history of IBS, HTN, L salpingo/oophorectomy complicated by small bowel injury 1997, and DM2 who presented to the ED with acute onset of severe generalized abdominal cramps not improving with OTC treatments.  In the ED imaging noted evidence of an SBO.  Consultants:  Gen Surgery   Code Status: FULL CODE  Antimicrobials:  none  DVT prophylaxis: SCDs  Interim Hx: No acute events noted over night. NG removed yesterday. Afebrile. VSS. No new labs today. Perhaps modest improvement, but pain continues and intake still poor. Denies cp, sob, or vomiting.   Assessment & Plan:  SBO Gen Surgery following - continue supportive care - possible OR tomorrow if not signif improved over night   Hypokalemia Due to poor intake -supplemented to goal  DM2 CBG well controlled - A1c 6.7 -patient refusing insulin dosing or CBG checks - stopped dextrose in IVF since pt will not allow CBG monitoring   Microcytic anemia anemia panel without evidence of severe iron deficiency but all indices borderline/low normal - hemoglobin stable  Asthma Well compensated at present  HTN Reasonably controlled at present  Obesity - Body mass index is 44.14 kg/m.   Family Communication: Spoke with sister at bedside Disposition: From home - anticipate eventual return to home  Objective: Blood pressure 137/78, pulse 81, temperature 98.9 F (37.2 C), temperature source Oral, resp. rate 18, height 5' (1.524 m), weight 102.5 kg, SpO2 97 %.  Intake/Output Summary (Last 24 hours) at 11/06/2021 0802 Last data filed at 11/06/2021 0600 Gross per 24 hour  Intake 1490 ml  Output --  Net 1490 ml    Filed Weights   11/01/21 0312  Weight: 102.5 kg    Examination: General: No acute respiratory distress -no wheezing Lungs: Clear to auscultation bilaterally Cardiovascular: Regular rate and  rhythm without murmur gallop or rub  Abdomen: Protuberant, soft, no rebound, BS+ Extremities: No edema B LE   CBC: Recent Labs  Lab 11/02/21 0353 11/03/21 0356 11/04/21 0424  WBC 6.1 7.9 8.9  HGB 11.9* 11.3* 10.6*  HCT 38.5 37.4 35.5*  MCV 69.1* 70.2* 70.7*  PLT 344 339 616    Basic Metabolic Panel: Recent Labs  Lab 11/02/21 0353 11/02/21 0356 11/03/21 0356 11/04/21 0424  NA 136  --  138 140  K 3.4*  --  3.8 3.9  CL 100  --  106 108  CO2 26  --  25 26  GLUCOSE 126*  --  130* 134*  BUN 15  --  13 8  CREATININE 0.91  --  1.00 0.97  CALCIUM 9.9  --  9.0 9.2  MG  --  2.3  --  2.3    GFR: Estimated Creatinine Clearance: 67.3 mL/min (by C-G formula based on SCr of 0.97 mg/dL).  Liver Function Tests: Recent Labs  Lab 11/01/21 0322 11/02/21 0353  AST 21 17  ALT 15 14  ALKPHOS 56 53  BILITOT 0.5 0.5  PROT 8.1 7.7  ALBUMIN 4.1 4.0    Recent Labs  Lab 11/01/21 0322  LIPASE 21     HbA1C: Hemoglobin A1C  Date/Time Value Ref Range Status  03/11/2019 12:00 AM 6.5  Final   Hgb A1c MFr Bld  Date/Time Value Ref Range Status  11/01/2021 10:47 AM 6.7 (H) 4.8 - 5.6 % Final    Comment:    (NOTE) Pre diabetes:  5.7%-6.4%  Diabetes:              >6.4%  Glycemic control for   <7.0% adults with diabetes   02/14/2021 10:03 AM 6.8 (H) 4.6 - 6.5 % Final    Comment:    Glycemic Control Guidelines for People with Diabetes:Non Diabetic:  <6%Goal of Therapy: <7%Additional Action Suggested:  >8%     CBG: Recent Labs  Lab 11/03/21 1154 11/03/21 1627 11/03/21 2110 11/04/21 0724 11/04/21 1122  GLUCAP 173* 124* 150* 141* 133*      Scheduled Meds:  arformoterol  15 mcg Nebulization BID   And   umeclidinium bromide  1 puff Inhalation Daily   And   budesonide (PULMICORT) nebulizer solution  0.25 mg Nebulization BID   enoxaparin (LOVENOX) injection  40 mg Subcutaneous Q24H   guaiFENesin  600 mg Oral BID   losartan  100 mg Oral Daily    LOS: 5 days    Cherene Altes, MD [facility] Office  [phone] Pager - Text Page per Shea Evans  If 7PM-7AM, please contact night-coverage per Amion 11/06/2021, 8:02 AM

## 2021-11-07 MED ORDER — MELATONIN 5 MG PO TABS
5.0000 mg | ORAL_TABLET | Freq: Once | ORAL | Status: AC
Start: 2021-11-07 — End: 2021-11-07
  Administered 2021-11-07: 01:00:00 5 mg via ORAL
  Filled 2021-11-07: qty 1

## 2021-11-07 MED ORDER — MELATONIN 5 MG PO TABS
5.0000 mg | ORAL_TABLET | Freq: Once | ORAL | Status: DC
Start: 2021-11-07 — End: 2021-11-08
  Filled 2021-11-07: qty 1

## 2021-11-07 NOTE — Progress Notes (Signed)
Patient ID: Mackenzie Weber, female   DOB: [birth year], 61 y.o.   MRN: [id number] [facility] Surgery Progress Note     Subjective: Did ok with some clears yesterday.  No further emesis, but some nausea.  Still with flatus.  Abdominal pain is minimal  Objective: Vital signs in last 24 hours: Temp:  [97.6 F (36.4 C)-98.7 F (37.1 C)] 98.7 F (37.1 C) (01/24 0506) Pulse Rate:  [69-80] 80 (01/24 0506) Resp:  [16-18] 18 (01/24 0506) BP: (122-167)/(73-95) 122/78 (01/24 0506) SpO2:  [99 %-100 %] 100 % (01/24 0506) Last BM Date: 11/05/21  Intake/Output from previous day: 01/23 0701 - 01/24 0700 In: 240 [P.O.:240] Out: 500 [Urine:500] Intake/Output this shift: No intake/output data recorded.  PE: Gen:  Alert, NAD, pleasant Pulm:  rate and effort normal Abd: soft, mild epigastric tenderness but minimal, obese  Lab Results:  No results for input(s): WBC, HGB, HCT, PLT in the last 72 hours.  BMET No results for input(s): NA, K, CL, CO2, GLUCOSE, BUN, CREATININE, CALCIUM in the last 72 hours.  PT/INR No results for input(s): LABPROT, INR in the last 72 hours. CMP     Component Value Date/Time   NA 140 11/04/2021 0424   NA 141 09/22/2021 1552   K 3.9 11/04/2021 0424   CL 108 11/04/2021 0424   CO2 26 11/04/2021 0424   GLUCOSE 134 (H) 11/04/2021 0424   BUN 8 11/04/2021 0424   BUN 18 09/22/2021 1552   CREATININE 0.97 11/04/2021 0424   CREATININE 0.89 04/15/2018 1430   CALCIUM 9.2 11/04/2021 0424   PROT 7.7 11/02/2021 0353   PROT 7.4 09/22/2021 1552   ALBUMIN 4.0 11/02/2021 0353   ALBUMIN 4.5 09/22/2021 1552   AST 17 11/02/2021 0353   ALT 14 11/02/2021 0353   ALKPHOS 53 11/02/2021 0353   BILITOT 0.5 11/02/2021 0353   BILITOT <0.2 09/22/2021 1552   GFRNONAA >60 11/04/2021 0424   GFRNONAA 58 (L) 03/18/2018 1357   GFRAA 67 03/18/2018 1357   Lipase     Component Value Date/Time   LIPASE 21 11/01/2021 0322       Studies/Results: DG Abd 2 Views  Result Date:  11/05/2021 CLINICAL DATA:  Small bowel obstruction. EXAM: ABDOMEN - 2 VIEW COMPARISON:  Abdominal radiograph 11/04/2021. FINDINGS: Left basilar atelectasis. Enteric tube tip and side-port project over the stomach. Persistent gaseous distended loops of small bowel within the central abdomen. There appears to be distal colonic gas. Lumbar spine degenerative changes. IMPRESSION: Findings compatible with small-bowel obstruction. Electronically Signed   By: Lovey Newcomer M.D.   On: 11/05/2021 12:41    Anti-infectives: Anti-infectives (From admission, onward)    None        Assessment/Plan SBO - adv to soft diet and see how this goes.  If she doesn't tolerate will go to OR tomorrow.  If she tolerates it well, then will continue with soft diet. -cont to mobilize -imaging, vitals, I&Os, chart for last 24-48 hrs reviewed   ID - none VTE - SCDs, Lovenox FEN - IVF, soft diet Foley - none   HTN DM Asthma Obesity BMI 44.14  Moderate Medical Decision Making   LOS: 6 days    Henreitta Cea, [facility] Surgery 11/07/2021, 8:30 AM Please see Amion for pager number during day hours 7:00am-4:30pm

## 2021-11-07 NOTE — Progress Notes (Addendum)
Mackenzie Weber  HFW:[id number] DOB: 1960-10-31 DOA: 11/01/2021 PCP: Flossie Buffy, NP    Brief Narrative:  [phone] with a history of IBS, HTN, L salpingo/oophorectomy complicated by small bowel injury 1997, and DM2 who presented to the ED with acute onset of severe generalized abdominal cramps not improving with OTC treatments.  In the ED imaging noted evidence of an SBO.  Consultants:  Gen Surgery   Code Status: FULL CODE  Antimicrobials:  none  DVT prophylaxis: SCDs  Interim Hx: Afebrile.  Vitals are stable.  No new labs today.  Patient has done well over the last 12 hours and therefore her diet is being advanced by General Surgery. In good spirits. Reports no signif abdom pain today. Thus far she is tolerating her diet well.   Assessment & Plan:  SBO Gen Surgery following - continue supportive care - now on trial of diet - if continues to do well she may be a candidate for d/c home 1/25 - if not, she will need to undergo bowel resection   Hypokalemia Due to poor intake -supplemented to goal  DM2 CBG well controlled - A1c 6.7 - patient refusing insulin dosing or CBG checks - stopped dextrose in IVF since pt will not allow CBG monitoring - serum glucose not signif elevated   Microcytic anemia anemia panel without evidence of severe iron deficiency but all indices borderline/low normal - hemoglobin stable  Asthma Well compensated at present  HTN controlled at present  Morbid Obesity - Body mass index is 44.14 kg/m.   Family Communication: no family present at time of exam today  Disposition: From home - anticipate eventual return to home  Objective: Blood pressure 122/78, pulse 80, temperature 98.7 F (37.1 C), temperature source Oral, resp. rate 18, height 5' (1.524 m), weight 102.5 kg, SpO2 100 %.  Intake/Output Summary (Last 24 hours) at 11/07/2021 0819 Last data filed at 11/07/2021 0600 Gross per 24 hour  Intake 240 ml  Output 500 ml  Net -260 ml     Filed Weights   11/01/21 0312  Weight: 102.5 kg    Examination: General: No acute respiratory distress - alert and oriented  Lungs: Clear to auscultation B Cardiovascular: RRR - no M or rub  Abdomen: Protuberant, soft, no rebound, BS+ Extremities: No edema B LE   CBC: Recent Labs  Lab 11/02/21 0353 11/03/21 0356 11/04/21 0424  WBC 6.1 7.9 8.9  HGB 11.9* 11.3* 10.6*  HCT 38.5 37.4 35.5*  MCV 69.1* 70.2* 70.7*  PLT 344 339 412    Basic Metabolic Panel: Recent Labs  Lab 11/02/21 0353 11/02/21 0356 11/03/21 0356 11/04/21 0424  NA 136  --  138 140  K 3.4*  --  3.8 3.9  CL 100  --  106 108  CO2 26  --  25 26  GLUCOSE 126*  --  130* 134*  BUN 15  --  13 8  CREATININE 0.91  --  1.00 0.97  CALCIUM 9.9  --  9.0 9.2  MG  --  2.3  --  2.3    GFR: Estimated Creatinine Clearance: 67.3 mL/min (by C-G formula based on SCr of 0.97 mg/dL).  Liver Function Tests: Recent Labs  Lab 11/01/21 0322 11/02/21 0353  AST 21 17  ALT 15 14  ALKPHOS 56 53  BILITOT 0.5 0.5  PROT 8.1 7.7  ALBUMIN 4.1 4.0    Recent Labs  Lab 11/01/21 0322  LIPASE 21     HbA1C: Hemoglobin  A1C  Date/Time Value Ref Range Status  03/11/2019 12:00 AM 6.5  Final   Hgb A1c MFr Bld  Date/Time Value Ref Range Status  11/01/2021 10:47 AM 6.7 (H) 4.8 - 5.6 % Final    Comment:    (NOTE) Pre diabetes:          5.7%-6.4%  Diabetes:              >6.4%  Glycemic control for   <7.0% adults with diabetes   02/14/2021 10:03 AM 6.8 (H) 4.6 - 6.5 % Final    Comment:    Glycemic Control Guidelines for People with Diabetes:Non Diabetic:  <6%Goal of Therapy: <7%Additional Action Suggested:  >8%     CBG: Recent Labs  Lab 11/03/21 1154 11/03/21 1627 11/03/21 2110 11/04/21 0724 11/04/21 1122  GLUCAP 173* 124* 150* 141* 133*    Scheduled Meds:  arformoterol  15 mcg Nebulization BID   And   umeclidinium bromide  1 puff Inhalation Daily   And   budesonide (PULMICORT) nebulizer solution   0.25 mg Nebulization BID   enoxaparin (LOVENOX) injection  40 mg Subcutaneous Q24H   losartan  100 mg Oral Daily    LOS: 6 days   Cherene Altes, MD [facility] Office  [phone] Pager - Text Page per Shea Evans  If 7PM-7AM, please contact night-coverage per Amion 11/07/2021, 8:19 AM

## 2021-11-07 NOTE — Progress Notes (Signed)
Patient maintained NPO after MN. Covering MD J. Olena Heckle contacted per Patient request. Patient ambulating in the hallway with significant other. Tolerated well.   Abdominal pain rated at a 2 this morning. Patient stated " I was able to rest well last night".

## 2021-11-07 NOTE — Telephone Encounter (Signed)
Thank you for the update!

## 2021-11-07 NOTE — Plan of Care (Signed)

## 2021-11-08 ENCOUNTER — Encounter: Payer: BC Managed Care – PPO | Admitting: Nurse Practitioner

## 2021-11-08 DIAGNOSIS — E876 Hypokalemia: Secondary | ICD-10-CM

## 2021-11-08 DIAGNOSIS — E1165 Type 2 diabetes mellitus with hyperglycemia: Secondary | ICD-10-CM

## 2021-11-08 LAB — CBC
HCT: 33.4 % — ABNORMAL LOW (ref 36.0–46.0)
Hemoglobin: 10 g/dL — ABNORMAL LOW (ref 12.0–15.0)
MCH: 21.1 pg — ABNORMAL LOW (ref 26.0–34.0)
MCHC: 29.9 g/dL — ABNORMAL LOW (ref 30.0–36.0)
MCV: 70.6 fL — ABNORMAL LOW (ref 80.0–100.0)
Platelets: 327 10*3/uL (ref 150–400)
RBC: 4.73 MIL/uL (ref 3.87–5.11)
RDW: 16.2 % — ABNORMAL HIGH (ref 11.5–15.5)
WBC: 4.6 10*3/uL (ref 4.0–10.5)
nRBC: 0 % (ref 0.0–0.2)

## 2021-11-08 LAB — BASIC METABOLIC PANEL
Anion gap: 5 (ref 5–15)
BUN: 12 mg/dL (ref 6–20)
CO2: 26 mmol/L (ref 22–32)
Calcium: 8.9 mg/dL (ref 8.9–10.3)
Chloride: 108 mmol/L (ref 98–111)
Creatinine, Ser: 0.91 mg/dL (ref 0.44–1.00)
GFR, Estimated: >60 mL/min (ref >60)
Glucose, Bld: 109 mg/dL — ABNORMAL HIGH (ref 70–99)
Potassium: 3.2 mmol/L — ABNORMAL LOW (ref 3.5–5.1)
Sodium: 139 mmol/L (ref 135–145)

## 2021-11-08 LAB — MAGNESIUM: Magnesium: 2 mg/dL (ref 1.7–2.4)

## 2021-11-08 MED ORDER — MENTHOL 3 MG MT LOZG: 1.0000 | LOZENGE | OROMUCOSAL | 12 refills | Status: DC | PRN

## 2021-11-08 MED ORDER — TRAMADOL HCL 50 MG PO TABS: 50.0000 mg | ORAL_TABLET | Freq: Four times a day (QID) | ORAL | 0 refills | Status: DC | PRN

## 2021-11-08 MED ORDER — POTASSIUM CHLORIDE CRYS ER 20 MEQ PO TBCR
40.0000 meq | EXTENDED_RELEASE_TABLET | Freq: Once | ORAL | Status: AC
Start: 2021-11-08 — End: 2021-11-08
  Administered 2021-11-08: 10:00:00 40 meq via ORAL
  Filled 2021-11-08: qty 2

## 2021-11-08 NOTE — Progress Notes (Signed)
Patient ID: Mackenzie Weber, female   DOB: 01-05-1961, 61 y.o.   MRN: [id number] [facility] Surgery Progress Note     Subjective: Tolerating soft diet well.  No further abdominal pain or nausea/vomiting.  Objective: Vital signs in last 24 hours: Temp:  [98.1 F (36.7 C)-98.5 F (36.9 C)] 98.4 F (36.9 C) (01/25 0611) Pulse Rate:  [70-76] 70 (01/25 0611) Resp:  [16-18] 16 (01/25 0611) BP: (130-150)/(82-87) 133/87 (01/25 0611) SpO2:  [99 %-100 %] 99 % (01/25 0729) Last BM Date: 11/05/21  Intake/Output from previous day: 01/24 0701 - 01/25 0700 In: 1080 [P.O.:1080] Out: 475 [Urine:475] Intake/Output this shift: No intake/output data recorded.  PE: Gen:  Alert, NAD, pleasant Pulm:  rate and effort normal Abd: soft, NT, obese, +BS  Lab Results:  No results for input(s): WBC, HGB, HCT, PLT in the last 72 hours.  BMET No results for input(s): NA, K, CL, CO2, GLUCOSE, BUN, CREATININE, CALCIUM in the last 72 hours.  PT/INR No results for input(s): LABPROT, INR in the last 72 hours. CMP     Component Value Date/Time   NA 140 11/04/2021 0424   NA 141 09/22/2021 1552   K 3.9 11/04/2021 0424   CL 108 11/04/2021 0424   CO2 26 11/04/2021 0424   GLUCOSE 134 (H) 11/04/2021 0424   BUN 8 11/04/2021 0424   BUN 18 09/22/2021 1552   CREATININE 0.97 11/04/2021 0424   CREATININE 0.89 04/15/2018 1430   CALCIUM 9.2 11/04/2021 0424   PROT 7.7 11/02/2021 0353   PROT 7.4 09/22/2021 1552   ALBUMIN 4.0 11/02/2021 0353   ALBUMIN 4.5 09/22/2021 1552   AST 17 11/02/2021 0353   ALT 14 11/02/2021 0353   ALKPHOS 53 11/02/2021 0353   BILITOT 0.5 11/02/2021 0353   BILITOT <0.2 09/22/2021 1552   GFRNONAA >60 11/04/2021 0424   GFRNONAA 58 (L) 03/18/2018 1357   GFRAA 67 03/18/2018 1357   Lipase     Component Value Date/Time   LIPASE 21 11/01/2021 0322       Studies/Results: No results found.  Anti-infectives: Anti-infectives (From admission, onward)    None         Assessment/Plan SBO - tolerating soft diet.  Wants to eat breakfast and lunch and if tolerates then would like to go home.  From a surgical standpoint this is fine with [place]. -discussed low fiber diet eating -imaging, vitals, I&Os, chart for last 24-48 hrs reviewed   ID - none VTE - SCDs, Lovenox FEN - IVF, soft diet Foley - none   HTN DM Asthma Obesity BMI 44.14  Straightforward Medical Decision Making   LOS: 7 days    Henreitta Cea, [facility] Surgery 11/08/2021, 7:36 AM Please see Amion for pager number during day hours 7:00am-4:30pm

## 2021-11-08 NOTE — Progress Notes (Signed)
Discharge instructions given to patient and all questions were answered.  

## 2021-11-08 NOTE — Discharge Summary (Signed)
Physician Discharge Summary  Mackenzie Weber ZOX:[id number] DOB: July 10, 1961 DOA: 11/01/2021  PCP: Flossie Buffy, NP  Admit date: 11/01/2021 Discharge date: 11/08/2021  Admitted From: Home  Discharge disposition: Home  Recommendations for Outpatient Follow-Up:   Follow up with your primary care provider in one week.  Check CBC, BMP, magnesium in the next visit  Discharge Diagnosis:   Principal Problem:   Small bowel obstruction ([place])   Discharge Condition: Improved.  Diet recommendation: Soft diet   Wound care: None.  Code status: Full.   History of Present Illness:   61 years old female with past medical history of irritable bowel syndrome, hypertension, history of left salpingo-oophorectomy complicated by bowel injury in 1997 and diabetes mellitus type 2 presented to hospital with acute onset of severe generalized abdominal cramps not improving with over-the-counter and treatment.  In the ED, patient was noted to have small bowel obstruction so was admitted hospital for further evaluation and treatment.  Hospital Course:   Following conditions were addressed during hospitalization as listed below,  Small bowel obstruction. Patient was seen by general surgery during hospitalization.  Patient was treated conservatively and at this time surgery has recommended discharge home if able to tolerate p.o. diet.  Patient was advised low fiber diet by surgery.  Hypokalemia Mildly low today.  We will replenish prior to discharge.  Patient takes potassium at home.  This will be continued on discharge.   Diabetes mellitus type 2. Hemoglobin A1c 6.7.  Continue metformin at home.  Continue diabetic diet  Microcytic anemia Hemoglobin of 10.0 at this time.  Monitor as outpatient.   Asthma Compensated at this time.  Continue inhalers at home.   Essential hypertension Patient takes Maxide at home.  Will be continued on discharge.   Morbid Obesity - Body mass index is 44.14  kg/m. Would benefit from weight loss as outpatient.  Disposition.  At this time, patient is stable for disposition home with outpatient PCP follow-up.  Medical Consultants:   General surgery.  Procedures:    None Subjective:   Today, patient was seen and examined at bedside.  Denies any nausea, vomiting, abdominal pain.  Has not had a bowel movement but has been passing gas.  Seen by general surgery.  Discharge Exam:   Vitals:   11/08/21 0611 11/08/21 0729  BP: 133/87   Pulse: 70   Resp: 16   Temp: 98.4 F (36.9 C)   SpO2: 99% 99%   Vitals:   11/07/21 1522 11/07/21 2041 11/08/21 0611 11/08/21 0729  BP: (!) 150/82 130/83 133/87   Pulse: 76 72 70   Resp: _0 Temp: 98.5 F (36.9 C) 98.1 F (36.7 C) 98.4 F (36.9 C)   TempSrc: Oral Oral Oral   SpO2: 100% 100% 99% 99%  Weight:      Height:       General: Alert awake, not in obvious distress HENT: pupils equally reacting to light,  No scleral pallor or icterus noted. Oral mucosa is moist.  Chest:  Clear breath sounds.  Diminished breath sounds bilaterally. No crackles or wheezes.  CVS: S1 &S2 heard. No murmur.  Regular rate and rhythm. Abdomen: Soft, nontender, mildly protuberant, nondistended.  Bowel sounds are heard.   Extremities: No cyanosis, clubbing or edema.  Peripheral pulses are palpable. Psych: Alert, awake and oriented, normal mood CNS:  No cranial nerve deficits.  Power equal in all extremities.   Skin: Warm and dry.  No rashes noted.  The results of significant diagnostics from this hospitalization (including imaging, microbiology, ancillary and laboratory) are listed below for reference.     Diagnostic Studies:   DG Abdomen 1 View  Result Date: 11/01/2021 CLINICAL DATA:  61 year old female NG tube placement. EXAM: ABDOMEN - 1 VIEW COMPARISON:  CT Abdomen and Pelvis 0456 hours today. FINDINGS: Portable AP view at 0658 hours. Enteric tube placed into the stomach, side hole at the level of the  gastric antrum. Stable cholecystectomy clips. Stable bowel gas pattern from the earlier CT. IMPRESSION: Satisfactory enteric tube placement into the stomach. Electronically Signed   By: Genevie Ann M.D.   On: 11/01/2021 07:13   CT ABDOMEN PELVIS W CONTRAST  Result Date: 11/01/2021 CLINICAL DATA:  61 year old female with acute abdominal pain. Possible bowel obstruction. EXAM: CT ABDOMEN AND PELVIS WITH CONTRAST TECHNIQUE: Multidetector CT imaging of the abdomen and pelvis was performed using the standard protocol following bolus administration of intravenous contrast. RADIATION DOSE REDUCTION: This exam was performed according to the departmental dose-optimization program which includes automated exposure control, adjustment of the mA and/or kV according to patient size and/or use of iterative reconstruction technique. CONTRAST:  119m OMNIPAQUE IOHEXOL 300 MG/ML  SOLN COMPARISON:  CT Abdomen and Pelvis 09/28/2021 and earlier. FINDINGS: Lower chest: Cardiac size is stable at the upper limits of normal. Low lung volumes with mild lung base atelectasis. No pericardial or pleural effusion. Hepatobiliary: Chronically absent gallbladder. Stable liver including a small partially exophytic right lobe cyst (series 3, image 16) since 2014 (benign). Pancreas: Negative. Spleen: Negative. Adrenals/Urinary Tract: Negative; small benign renal parapelvic cysts. Stomach/Bowel: Distal rectal anastomosis on series 3, image 70 with no adverse features. Upstream rectum and sigmoid colon are decompressed, negative. Decompressed descending colon and mildly redundant transverse colon. Negative right colon. Terminal ileum is decompressed and within normal limits on series 3, image 51. Decompressed small bowel loops in the right lower quadrant with a transition point in the midline suprapubic abdomen (series 3, image 62 and coronal image 52. Upstream dilated fluid containing small bowel, with flocculated material within a dilated 4 cm loop  immediately upstream of a small bowel anastomosis near the transition on series 3, image 57. Small volume of free fluid in the small bowel mesentery there. Decompressed proximal jejunum with a gradual transition to the more distal dilated small bowel loops. Stomach and duodenum are within normal limits. No free air. Vascular/Lymphatic: Major arterial structures in the abdomen and pelvis are patent. Portal venous system is patent. No lymphadenopathy identified. Reproductive: Surgically absent uterus. Diminutive or absent ovaries. Other: No pelvic free fluid. Musculoskeletal: Chronic lower lumbar facet arthropathy. No acute osseous abnormality identified. IMPRESSION: 1. 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 Small volume of free fluid in the affected small bowel mesentery. No free air or other complicating features. 2. Otherwise stable abdomen and pelvis, including a rectal anastomosis with no adverse features. Electronically Signed   By: HGenevie AnnM.D.   On: 11/01/2021 05:41   DG Abd Portable 1V  Result Date: 11/02/2021 CLINICAL DATA:  Abdominal pain and discomfort. EXAM: PORTABLE ABDOMEN - 1 VIEW COMPARISON:  11/01/2021 FINDINGS: NG tube extends the stomach. Unchanged from prior. Loops of small bowel measure up to 2.8 cm which is within normal limits. Cholecystectomy clips in the RIGHT upper quadrant IMPRESSION: NG tube in stomach.  Bowel gas pattern within normal limits. Electronically Signed   By: SHelane GuntherD.  On: 11/02/2021 10:12   DG Abd Portable 1V-Small Bowel Obstruction Protocol-initial, 8 hr delay  Result Date: 11/01/2021 CLINICAL DATA:  Small bowel protocol 8 hour postcontrast film. EXAM: PORTABLE ABDOMEN - 1 VIEW COMPARISON:  Abdominal x-ray 11/01/2021. CT abdomen and pelvis 11/01/2021. FINDINGS: Enteric tube tip is in the distal stomach. There are mildly dilated central small bowel loops. Oral contrast is  seen in mid small bowel, but not within the colon or distal small bowel. There is a small amount of air in the colon. Contrast is also seen within the bladder. IMPRESSION: Stable dilated small bowel worrisome for small bowel obstruction. Contrast reaches mid small bowel. Electronically Signed   By: Ronney Asters M.D.   On: 11/01/2021 20:24     Labs:   Basic Metabolic Panel: Recent Labs  Lab 11/02/21 0353 11/02/21 0356 11/03/21 0356 11/04/21 0424 11/08/21 0824  NA 136  --  138 140 139  K 3.4*  --  3.8 3.9 3.2*  CL 100  --  106 108 108  CO2 26  --  _0 GLUCOSE 126*  --  130* 134* 109*  BUN 15  --  _1 CREATININE 0.91  --  1.00 0.97 0.91  CALCIUM 9.9  --  9.0 9.2 8.9  MG  --  2.3  --  2.3 2.0   GFR Estimated Creatinine Clearance: 71.8 mL/min (by C-G formula based on SCr of 0.91 mg/dL). Liver Function Tests: Recent Labs  Lab 11/02/21 0353  AST 17  ALT 14  ALKPHOS 53  BILITOT 0.5  PROT 7.7  ALBUMIN 4.0   No results for input(s): LIPASE, AMYLASE in the last 168 hours. No results for input(s): AMMONIA in the last 168 hours. Coagulation profile No results for input(s): INR, PROTIME in the last 168 hours.  CBC: Recent Labs  Lab 11/02/21 0353 11/03/21 0356 11/04/21 0424 11/08/21 0824  WBC 6.1 7.9 8.9 4.6  HGB 11.9* 11.3* 10.6* 10.0*  HCT 38.5 37.4 35.5* 33.4*  MCV 69.1* 70.2* 70.7* 70.6*  PLT 344 339 324 327   Cardiac Enzymes: No results for input(s): CKTOTAL, CKMB, CKMBINDEX, TROPONINI in the last 168 hours. BNP: Invalid input(s): POCBNP CBG: Recent Labs  Lab 11/03/21 1154 11/03/21 1627 11/03/21 2110 11/04/21 0724 11/04/21 1122  GLUCAP 173* 124* 150* 141* 133*   D-Dimer No results for input(s): DDIMER in the last 72 hours. Hgb A1c No results for input(s): HGBA1C in the last 72 hours. Lipid Profile No results for input(s): CHOL, HDL, LDLCALC, TRIG, CHOLHDL, LDLDIRECT in the last 72 hours. Thyroid function studies No results for input(s):  TSH, T4TOTAL, T3FREE, THYROIDAB in the last 72 hours.  Invalid input(s): FREET3 Anemia work up No results for input(s): VITAMINB12, FOLATE, FERRITIN, TIBC, IRON, RETICCTPCT in the last 72 hours. Microbiology Recent Results (from the past 240 hour(s))  Resp Panel by RT-PCR (Flu A&B, Covid) Nasopharyngeal Swab     Status: None   Collection Time: 11/01/21  7:37 AM   Specimen: Nasopharyngeal Swab; Nasopharyngeal(NP) swabs in vial transport medium  Result Value Ref Range Status   SARS Coronavirus 2 by RT PCR NEGATIVE NEGATIVE Final    Comment: (NOTE) SARS-CoV-2 target nucleic acids are NOT DETECTED.  The SARS-CoV-2 RNA is generally detectable in upper respiratory specimens during the acute phase of infection. The lowest concentration of SARS-CoV-2 viral copies this assay can detect is 138 copies/mL. A negative result does not preclude SARS-Cov-2 infection and should not be used as the sole  basis for treatment or other patient management decisions. A negative result may occur with  improper specimen collection/handling, submission of specimen other than nasopharyngeal swab, presence of viral mutation(s) within the areas targeted by this assay, and inadequate number of viral copies(<138 copies/mL). A negative result must be combined with clinical observations, patient history, and epidemiological information. The expected result is Negative.  Fact Sheet for Patients:  EntrepreneurPulse.com.au  Fact Sheet for Healthcare Providers:  IncredibleEmployment.be  This test is no t yet approved or cleared by the [place] FDA and  has been authorized for detection and/or diagnosis of SARS-CoV-2 by FDA under an Emergency Use Authorization (EUA). This EUA will remain  in effect (meaning this test can be used) for the duration of the COVID-19 declaration under Section 564(b)(1) of the Act, 21 U.S.C.section 360bbb-3(b)(1), unless the authorization is  terminated  or revoked sooner.       Influenza A by PCR NEGATIVE NEGATIVE Final   Influenza B by PCR NEGATIVE NEGATIVE Final    Comment: (NOTE) The Xpert Xpress SARS-CoV-2/FLU/RSV plus assay is intended as an aid in the diagnosis of influenza from Nasopharyngeal swab specimens and should not be used as a sole basis for treatment. Nasal washings and aspirates are unacceptable for Xpert Xpress SARS-CoV-2/FLU/RSV testing.  Fact Sheet for Patients: EntrepreneurPulse.com.au  Fact Sheet for Healthcare Providers: IncredibleEmployment.be  This test is not yet approved or cleared by the [place] FDA and has been authorized for detection and/or diagnosis of SARS-CoV-2 by FDA under an Emergency Use Authorization (EUA). This EUA will remain in effect (meaning this test can be used) for the duration of the COVID-19 declaration under Section 564(b)(1) of the Act, 21 U.S.C. section 360bbb-3(b)(1), unless the authorization is terminated or revoked.  Performed at [facility], [address] [place],  [address]      Discharge Instructions:   Discharge Instructions     Call MD for:  persistant nausea and vomiting   Complete by: As directed    Call MD for:  severe uncontrolled pain   Complete by: As directed    Diet Carb Modified   Complete by: As directed    Discharge instructions   Complete by: As directed    Follow-up with your primary care physician in 1 week.  Check blood work at that time.  Seek medical attention for worsening symptoms.  Soft diet for couple of days.   Increase activity slowly   Complete by: As directed       Allergies as of 11/08/2021       Reactions   Cortisone    Red area around injection site x 80mh   Depo-medrol [methylprednisolone Sodium Succ] Nausea Only, Other (See Comments)   Dizziness   Aspirin Anxiety, Other (See Comments)   Rapid Heart beat        Medication List     STOP  taking these medications    ibuprofen 200 MG tablet Commonly known as: ADVIL       TAKE these medications    azelastine 0.1 % nasal spray Commonly known as: ASTELIN 1-2 sprays  each nostril twice daily as needed What changed:  how much to take how to take this when to take this reasons to take this additional instructions   blood glucose meter kit and supplies Kit Check blood sugar once daily. One Touch Verio Reflect Meter   Breztri Aerosphere 160-9-4.8 MCG/ACT Aero Generic drug: Budeson-Glycopyrrol-Formoterol Inhale 2 puffs into the lungs daily. Use  with spacer. Rinse, gargle and spit out after use.   EPINEPHrine 0.3 mg/0.3 mL Soaj injection Commonly known as: Auvi-Q Inject 0.3 mg into the muscle as needed for anaphylaxis.   estradiol 0.1 MG/GM vaginal cream Commonly known as: ESTRACE Place 1 Applicatorful vaginally once a week.   famotidine 20 MG tablet Commonly known as: Pepcid Take 1 tablet (20 mg total) by mouth at bedtime.   ipratropium 0.03 % nasal spray Commonly known as: ATROVENT Place 2 sprays into both nostrils 3 (three) times daily. What changed:  when to take this reasons to take this   losartan 100 MG tablet Commonly known as: COZAAR Take 1 tablet (100 mg total) by mouth daily.   menthol-cetylpyridinium 3 MG lozenge Commonly known as: CEPACOL Take 1 lozenge (3 mg total) by mouth as needed for sore throat.   metFORMIN 500 MG 24 hr tablet Commonly known as: GLUCOPHAGE-XR TAKE 1 TABLET(500 MG) BY MOUTH DAILY WITH BREAKFAST What changed: See the new instructions.   montelukast 10 MG tablet Commonly known as: SINGULAIR Take 1 tablet (10 mg total) by mouth at bedtime. FOR SEASONAL ALLERGY What changed:  when to take this reasons to take this additional instructions   multivitamin capsule Take 1 capsule by mouth daily.   NON FORMULARY 2 allergy injections every week   Olopatadine HCl 0.2 % Soln Commonly known as: Pataday Place 1 drop  into both eyes daily as needed. What changed: reasons to take this   OneTouch Delica Plus [id number] Misc USE TO TEST BLOOD SUGAR ONCE DAILY   OneTouch Verio test strip Generic drug: glucose blood USE TO TEST BLOOD SUGAR ONCE DAILY   pantoprazole 40 MG tablet Commonly known as: PROTONIX Take 1 tablet (40 mg total) by mouth 2 (two) times daily.   potassium chloride SA 20 MEQ tablet Commonly known as: KLOR-CON M Take 1 tablet (20 mEq total) by mouth daily.   ProAir HFA 108 (90 Base) MCG/ACT inhaler Generic drug: albuterol INHALE 2 PUFFS BY MOUTH EVERY 4 HOURS AS NEEDED FOR WHEEZING FOR SHORTNESS OF BREATH What changed: See the new instructions.   traMADol 50 MG tablet Commonly known as: ULTRAM Take 1 tablet (50 mg total) by mouth every 6 (six) hours as needed for moderate pain.   triamterene-hydrochlorothiazide 75-50 MG tablet Commonly known as: Maxzide Take 0.5 tablets by mouth daily.          Follow-up Information     [facility] [place] Follow up today.   Specialty: General Surgery Why: no follow up needed, but this is our office information if needed Contact information: [address]  [place] [address] [phone]         Nche, Charlotte Lum, NP Follow up in 1 week(s).   Specialty: Internal Medicine Contact information: [place] [address] [phone]         Lorretta Harp, MD .   Specialties: Cardiology, Radiology Contact information: [address] [place] [address] [phone]                  Time coordinating discharge: 39 minutes  Signed:  Devian Bartolomei  [facility] 11/08/2021, 9:35 AM

## 2021-11-13 ENCOUNTER — Telehealth: Payer: Self-pay | Admitting: Gastroenterology

## 2021-11-13 NOTE — Telephone Encounter (Signed)
Pt states that she was admitted for 8 days at [facility] for a SBO. Pt had NG tube. SBO did resolve with No surgery. Pt states that she just wanted to make sure that Dr. Lyndel Safe was aware of this and question's if she needs an OV with Dr. Lyndel Safe. Pt states that she is having BM's with no complications. Last BM was  yesterday. Please advise if you would like an OV for this pt.

## 2021-11-13 NOTE — Telephone Encounter (Signed)
Patient called and stated that she wanted to make Dr. Lyndel Safe aware that she had a Small Bowel obstruction done at [facility]. Patient is seeking advice to see if he would like her to come in for a OV. Please advise.

## 2021-11-14 ENCOUNTER — Encounter: Payer: BC Managed Care – PPO | Admitting: Nurse Practitioner

## 2021-11-16 ENCOUNTER — Encounter: Payer: Self-pay | Admitting: Nurse Practitioner

## 2021-11-16 ENCOUNTER — Ambulatory Visit (INDEPENDENT_AMBULATORY_CARE_PROVIDER_SITE_OTHER): Payer: BC Managed Care – PPO | Admitting: Nurse Practitioner

## 2021-11-16 ENCOUNTER — Ambulatory Visit (INDEPENDENT_AMBULATORY_CARE_PROVIDER_SITE_OTHER): Payer: BC Managed Care – PPO

## 2021-11-16 ENCOUNTER — Other Ambulatory Visit: Payer: Self-pay

## 2021-11-16 VITALS — BP 118/72 | HR 72 | Temp 96.8°F | Ht 58.5 in | Wt 221.8 lb

## 2021-11-16 DIAGNOSIS — K56609 Unspecified intestinal obstruction, unspecified as to partial versus complete obstruction: Secondary | ICD-10-CM

## 2021-11-16 DIAGNOSIS — I10 Essential (primary) hypertension: Secondary | ICD-10-CM

## 2021-11-16 DIAGNOSIS — E876 Hypokalemia: Secondary | ICD-10-CM | POA: Diagnosis not present

## 2021-11-16 LAB — CBC WITH DIFFERENTIAL/PLATELET
Basophils Absolute: 0.1 10*3/uL (ref 0.0–0.1)
Basophils Relative: 1.6 % (ref 0.0–3.0)
Eosinophils Absolute: 0.1 10*3/uL (ref 0.0–0.7)
Eosinophils Relative: 1.9 % (ref 0.0–5.0)
HCT: 36.7 % (ref 36.0–46.0)
Hemoglobin: 11.2 g/dL — ABNORMAL LOW (ref 12.0–15.0)
Lymphocytes Relative: 40.4 % (ref 12.0–46.0)
Lymphs Abs: 2.5 10*3/uL (ref 0.7–4.0)
MCHC: 30.5 g/dL (ref 30.0–36.0)
MCV: 67.7 fl — ABNORMAL LOW (ref 78.0–100.0)
Monocytes Absolute: 0.6 10*3/uL (ref 0.1–1.0)
Monocytes Relative: 9.2 % (ref 3.0–12.0)
Neutro Abs: 2.9 10*3/uL (ref 1.4–7.7)
Neutrophils Relative %: 46.9 % (ref 43.0–77.0)
Platelets: 328 10*3/uL (ref 150.0–400.0)
RBC: 5.41 Mil/uL — ABNORMAL HIGH (ref 3.87–5.11)
RDW: 16.8 % — ABNORMAL HIGH (ref 11.5–15.5)
WBC: 6.1 10*3/uL (ref 4.0–10.5)

## 2021-11-16 NOTE — Assessment & Plan Note (Addendum)
Hospitalization 11/01/21-11/08/21. Reviewed discharge summary, lab results and radiology report. Today she reports resolved vomiting, able to tolerate food and liquids, normal BM EOD with use of miralax, intermittent epigastric pain postprandial and with BM. S/p appendectomy, cholecystectomy, and hysterectomy.  Repeat ABD DG,  cbc, bmp and magnesium Advised to resume pantoprazole and famotidine. Advised to continue soft low fiber diet and maintain adequate oral hydration

## 2021-11-16 NOTE — Progress Notes (Signed)
Subjective:  Patient ID: Mackenzie Weber, female    DOB: 1961-02-12  Age: 61 y.o. MRN: [id number]  CC: Hospitalization Follow-up (F/u for hospital visit due to SBO. /Pt states since leaving the hospital she has had some stomach pains after eating and when it is time for bowel movements. Pt states she has been having bowel movements regularly since leaving the hospital. )  HPI Small bowel obstruction ([place]) Hospitalization 11/01/21-11/08/21. Reviewed discharge summary, lab results and radiology report. Today she reports resolved vomiting, able to tolerate food and liquids, normal BM EOD with use of miralax, intermittent epigastric pain postprandial and with BM. S/p appendectomy, cholecystectomy, and hysterectomy.  Repeat ABD DG,  cbc, bmp and magnesium Advised to resume pantoprazole and famotidine. Advised to continue soft low fiber diet and maintain adequate oral hydration  Hypertensive disorder BP at goal BP Readings from Last 3 Encounters:  11/16/21 118/72  11/08/21 133/87  10/26/21 118/62   Repeat BMP Maintain current dose of losartan and maxzide  Reviewed past Medical, Social and Family history today.  Outpatient Medications Prior to Visit  Medication Sig Dispense Refill   azelastine (ASTELIN) 0.1 % nasal spray 1-2 sprays  each nostril twice daily as needed (Patient taking differently: Place 1-2 sprays into both nostrils 2 (two) times daily as needed for allergies.) 90 mL 1   blood glucose meter kit and supplies KIT Check blood sugar once daily. One Touch Verio Reflect Meter 1 each 0   Budeson-Glycopyrrol-Formoterol (BREZTRI AEROSPHERE) 160-9-4.8 MCG/ACT AERO Inhale 2 puffs into the lungs daily. Use with spacer. Rinse, gargle and spit out after use. 10.7 g 5   EPINEPHrine (AUVI-Q) 0.3 mg/0.3 mL IJ SOAJ injection Inject 0.3 mg into the muscle as needed for anaphylaxis. 2 each 1   estradiol (ESTRACE) 0.1 MG/GM vaginal cream Place 1 Applicatorful vaginally once a week.      famotidine (PEPCID) 20 MG tablet Take 1 tablet (20 mg total) by mouth at bedtime. 90 tablet 4   glucose blood (ONETOUCH VERIO) test strip USE TO TEST BLOOD SUGAR ONCE DAILY 100 strip 3   ipratropium (ATROVENT) 0.03 % nasal spray Place 2 sprays into both nostrils 3 (three) times daily. (Patient taking differently: Place 2 sprays into both nostrils 3 (three) times daily as needed for rhinitis.) 30 mL 3   Lancets (ONETOUCH DELICA PLUS [id number]) MISC USE TO TEST BLOOD SUGAR ONCE DAILY 100 each 3   losartan (COZAAR) 100 MG tablet Take 1 tablet (100 mg total) by mouth daily. 90 tablet 3   menthol-cetylpyridinium (CEPACOL) 3 MG lozenge Take 1 lozenge (3 mg total) by mouth as needed for sore throat. 100 tablet 12   metFORMIN (GLUCOPHAGE-XR) 500 MG 24 hr tablet TAKE 1 TABLET(500 MG) BY MOUTH DAILY WITH BREAKFAST (Patient taking differently: Take 500 mg by mouth daily.) 90 tablet 1   montelukast (SINGULAIR) 10 MG tablet Take 1 tablet (10 mg total) by mouth at bedtime. FOR SEASONAL ALLERGY (Patient taking differently: Take 10 mg by mouth daily as needed (seasonal allergies).) 30 tablet 5   Multiple Vitamin (MULTIVITAMIN) capsule Take 1 capsule by mouth daily.     NON FORMULARY 2 allergy injections every week     Olopatadine HCl (PATADAY) 0.2 % SOLN Place 1 drop into both eyes daily as needed. (Patient taking differently: Place 1 drop into both eyes daily as needed (itching).) 2.5 mL 5   pantoprazole (PROTONIX) 40 MG tablet Take 1 tablet (40 mg total) by mouth 2 (two) times daily. [place]  tablet 3   potassium chloride SA (KLOR-CON M) 20 MEQ tablet Take 1 tablet (20 mEq total) by mouth daily. 30 tablet 0   PROAIR HFA 108 (90 Base) MCG/ACT inhaler INHALE 2 PUFFS BY MOUTH EVERY 4 HOURS AS NEEDED FOR WHEEZING FOR SHORTNESS OF BREATH (Patient taking differently: Inhale 2 puffs into the lungs every 4 (four) hours as needed for wheezing or shortness of breath.) 18 g 1   traMADol (ULTRAM) 50 MG tablet Take 1 tablet (50 mg  total) by mouth every 6 (six) hours as needed for moderate pain. 10 tablet 0   triamterene-hydrochlorothiazide (MAXZIDE) 75-50 MG tablet Take 0.5 tablets by mouth daily. 45 tablet 1   No facility-administered medications prior to visit.    ROS See HPI  Objective:  BP 118/72 (BP Location: Left Arm, Patient Position: Sitting, Cuff Size: Large)    Pulse 72    Temp (!) 96.8 F (36 C) (Temporal)    Ht 4' 10.5" (1.486 m)    Wt 221 lb 12.8 oz (100.6 kg)    SpO2 100%    BMI 45.57 kg/m   Physical Exam Constitutional:      General: She is not in acute distress. Cardiovascular:     Rate and Rhythm: Normal rate and regular rhythm.     Pulses: Normal pulses.     Heart sounds: Normal heart sounds.  Pulmonary:     Effort: Pulmonary effort is normal.     Breath sounds: Normal breath sounds.  Abdominal:     General: Bowel sounds are normal. There is no distension.     Palpations: Abdomen is soft.     Tenderness: There is abdominal tenderness. There is no guarding.  Neurological:     Mental Status: She is alert and oriented to person, place, and time.   Assessment & Plan:  This visit occurred during the SARS-CoV-2 public health emergency.  Safety protocols were in place, including screening questions prior to the visit, additional usage of staff PPE, and extensive cleaning of exam room while observing appropriate contact time as indicated for disinfecting solutions.   Mackenzie Weber was seen today for hospitalization follow-up.  Diagnoses and all orders for this visit:  Small bowel obstruction ([place]) -     DG Abd 2 Views -     CBC with Differential/Platelet -     Magnesium  Primary hypertension -     Basic metabolic panel  Hypokalemia -     Basic metabolic panel    Problem List Items Addressed This Visit       Cardiovascular and Mediastinum   Hypertensive disorder    BP at goal BP Readings from Last 3 Encounters:  11/16/21 118/72  11/08/21 133/87  10/26/21 118/62   Repeat  BMP Maintain current dose of losartan and maxzide      Relevant Orders   Basic metabolic panel     Digestive   Small bowel obstruction ([place]) - Primary    Hospitalization 11/01/21-11/08/21. Reviewed discharge summary, lab results and radiology report. Today she reports resolved vomiting, able to tolerate food and liquids, normal BM EOD with use of miralax, intermittent epigastric pain postprandial and with BM. S/p appendectomy, cholecystectomy, and hysterectomy.  Repeat ABD DG,  cbc, bmp and magnesium Advised to resume pantoprazole and famotidine. Advised to continue soft low fiber diet and maintain adequate oral hydration      Relevant Orders   DG Abd 2 Views   CBC with Differential/Platelet   Magnesium   Other Visit  Diagnoses     Hypokalemia       Relevant Orders   Basic metabolic panel        Follow-up: Return in about 3 months (around 02/13/2022) for DM and HTN, hyperlipidemia (fasting).  Wilfred Lacy, NP

## 2021-11-16 NOTE — Patient Instructions (Signed)
Go to lab for blood draw and ABD x-ray  Bowel Obstruction A bowel obstruction means that something is blocking the small or large bowel. The bowel is also called the intestine. It is the long tube that connects the stomach to the opening of the butt (anus). When something blocks the bowel, food and fluids cannot pass through like normal. This condition needs to be treated. Treatment depends on the cause of the problem and how bad the problem is. What are the causes? Common causes of this condition include: Scar tissue inside the body from past surgery or from high-energy X-rays (radiation). Recent surgery in the belly. This affects how food moves in the bowel. Some diseases, such as: Irritation of the lining of the digestive tract (Crohn's disease). Irritation of small pouches in the bowel (diverticulitis). Growths or tumors. A bulging organ or tissue (hernia). Twisting of the bowel (volvulus). A swallowed object. Slipping of a part of the bowel into another part (intussusception). What are the signs or symptoms? Symptoms of this condition include: Pain in the belly. Feeling like you may vomit (nauseous). Vomiting. Bloating in the belly. Being unable to pass gas. Trouble pooping (constipation). A lot of belching. Watery poop (diarrhea). How is this treated? Treatment for this condition may include: Fluids and pain medicines that are given through an IV tube. Your doctor may tell you not to eat or drink if you feel like you may vomit or are vomiting. Eating a clear liquid diet for a few days. Putting a small tube (nasogastric tube) through the nose, down the throat, and into the stomach. This will help with pain, discomfort, and the feeling like you may vomit. Surgery. This may be needed if other treatments do not work. Follow these instructions at home: Medicines Take over-the-counter and prescription medicines only as told by your doctor. If you were prescribed an antibiotic  medicine, take it as told by your doctor. Do not stop taking the antibiotic even if you start to feel better. General instructions Follow instructions from your doctor about what you can or cannot eat and drink. You may need to: Only drink clear liquids until you start to get better. Avoid solid foods. Return to your normal activities when your doctor says that it is safe. Rest as told by your doctor. Get up to take short walks every 1 to 2 hours. Ask for help if you feel weak or unsteady. Keep all follow-up visits. How is this prevented? After having a bowel obstruction, you may be more likely to have another. You can do some things to stop it from happening again. If you have a long-term (chronic) disease, contact your doctor if you see changes or problems. Avoid having trouble pooping. You may need to take these actions to prevent or treat trouble pooping: Drink enough fluid to keep your pee (urine) pale yellow. Take over-the-counter or prescription medicines. Eat foods that are high in fiber. These include beans, whole grains, and fresh fruits and vegetables. Limit foods that are high in fat and sugar. These include fried or sweet foods. Stay active. Ask your doctor which exercises are safe for you. Avoid stress. Eat three small meals and three small snacks each day. Work with a diet and Building services engineer (dietitian) to make a meal plan that works for you. Do not smoke or use any products that contain nicotine or tobacco. If you need help quitting, ask your doctor. Contact a doctor if: You have a fever. You have chills. Get help  right away if: You have pain or cramps that get worse. You vomit blood. You feel like you may vomit and the feeling lasts a long time. You cannot stop vomiting. You cannot drink fluids. You feel confused. You feel very thirsty (dehydrated). Your belly gets more bloated. You feel weak or you faint. Summary A bowel obstruction means that something is  blocking the small or large bowel. Treatment may include IV fluids and pain medicine. You may also have a clear liquid diet, a small tube in your stomach, or surgery. Drink clear liquids and avoid solid foods until you get better, as told by your doctor. This information is not intended to replace advice given to you by your health care provider. Make sure you discuss any questions you have with your health care provider. Document Revised: 11/13/2020 Document Reviewed: 11/13/2020 Elsevier Patient Education  2022 [employer].

## 2021-11-16 NOTE — Assessment & Plan Note (Signed)
BP at goal BP Readings from Last 3 Encounters:  11/16/21 118/72  11/08/21 133/87  10/26/21 118/62   Repeat BMP Maintain current dose of losartan and maxzide

## 2021-11-17 ENCOUNTER — Telehealth: Payer: Self-pay

## 2021-11-17 ENCOUNTER — Other Ambulatory Visit: Payer: Self-pay

## 2021-11-17 DIAGNOSIS — E1165 Type 2 diabetes mellitus with hyperglycemia: Secondary | ICD-10-CM

## 2021-11-17 LAB — BASIC METABOLIC PANEL
BUN: 17 mg/dL (ref 6–23)
CO2: 25 mEq/L (ref 19–32)
Calcium: 10.1 mg/dL (ref 8.4–10.5)
Chloride: 104 mEq/L (ref 96–112)
Creatinine, Ser: 1.03 mg/dL (ref 0.40–1.20)
GFR: 59.42 mL/min — ABNORMAL LOW (ref >60.00)
Glucose, Bld: 79 mg/dL (ref 70–99)
Potassium: 4 mEq/L (ref 3.5–5.1)
Sodium: 141 mEq/L (ref 135–145)

## 2021-11-17 LAB — MAGNESIUM: Magnesium: 2.2 mg/dL (ref 1.5–2.5)

## 2021-11-17 MED ORDER — METFORMIN HCL ER 500 MG PO TB24: ORAL_TABLET | ORAL | 1 refills | Status: DC

## 2021-11-17 NOTE — Telephone Encounter (Signed)
Patient would like to know what she should do in regards to eating and should she been sent to a specialist to get advice regarding how to eat. Pt also would like to know if she should be seen by Dr. Lyndel Safe? Please advise.

## 2021-11-17 NOTE — Telephone Encounter (Signed)
Chart supports Rx Next OV 02/2022

## 2021-11-24 NOTE — Telephone Encounter (Signed)
Pt states she is still doing the soft diet and there has been improvement with the medication.

## 2021-11-28 ENCOUNTER — Telehealth: Payer: Self-pay | Admitting: Nurse Practitioner

## 2021-11-28 NOTE — Telephone Encounter (Signed)
Pt is wanting her x-rays from 11/16/21 compared to recent x-rays taken on 11/05/21 by Dr. Marlou Starks. Please advise pt at [phone].

## 2021-11-29 NOTE — Telephone Encounter (Signed)
Please advise 

## 2021-12-11 ENCOUNTER — Telehealth: Payer: Self-pay | Admitting: Nurse Practitioner

## 2021-12-11 NOTE — Telephone Encounter (Signed)
Pt would called and requested call back from CMA, She has some questions about Ozempic. Callback is [phone]

## 2021-12-14 NOTE — Telephone Encounter (Signed)
Patient notified and verbalized understanding. Appointment scheduled. 

## 2021-12-14 NOTE — Telephone Encounter (Signed)
Pt would like to know if she could try ozempic to help with weight management and diabetes? ?Pt also wants to know is there anything she needs to do regarding her bowels, she states she takes the miralax every other day and has been doing fine but wants to know how she is suppose to know if there is any improvement if there is no more imaging being done? ?

## 2021-12-15 ENCOUNTER — Ambulatory Visit (INDEPENDENT_AMBULATORY_CARE_PROVIDER_SITE_OTHER): Payer: BC Managed Care – PPO | Admitting: Nurse Practitioner

## 2021-12-15 ENCOUNTER — Other Ambulatory Visit: Payer: Self-pay

## 2021-12-15 ENCOUNTER — Encounter: Payer: Self-pay | Admitting: Nurse Practitioner

## 2021-12-15 DIAGNOSIS — Z6841 Body Mass Index (BMI) 40.0 and over, adult: Secondary | ICD-10-CM

## 2021-12-15 DIAGNOSIS — E1165 Type 2 diabetes mellitus with hyperglycemia: Secondary | ICD-10-CM

## 2021-12-15 DIAGNOSIS — F411 Generalized anxiety disorder: Secondary | ICD-10-CM | POA: Diagnosis not present

## 2021-12-15 DIAGNOSIS — K219 Gastro-esophageal reflux disease without esophagitis: Secondary | ICD-10-CM

## 2021-12-15 DIAGNOSIS — E66813 Obesity, class 3: Secondary | ICD-10-CM

## 2021-12-15 DIAGNOSIS — K56609 Unspecified intestinal obstruction, unspecified as to partial versus complete obstruction: Secondary | ICD-10-CM

## 2021-12-15 NOTE — Assessment & Plan Note (Addendum)
Stable with pantoprazole and pepcid ?Under the care of Dr. Lyndel Safe. Last appt 09/2021. ?EGD 03/2021: hiatal hernia and barrett's esophagus ?Last colonoscopy 2020, repeat in 36yrs ?Improved constipation with miralax EOD, 2-3BMs per day per patient ?

## 2021-12-15 NOTE — Assessment & Plan Note (Signed)
resolved 

## 2021-12-15 NOTE — Assessment & Plan Note (Signed)
Anxious about her health since recent hospital stay 10/2021 (hospitalized for SBO). Fear to eat certain food, exercise and go far from home. She admits to panic attacks at least 1-2x/day. Improves with deep breathing, prayer and self talk. ?This also interferes with sleep. She sleep 2-3hrs at a time. ?Advised about need for medication and psychology appt. ?She relies on her church family for support and does no want to take any medication at this time. ?F/up in 25month ?

## 2021-12-15 NOTE — Assessment & Plan Note (Signed)
Wants to switch from metformin to ozempic injection, to facilitate weight loss and maintain glucose control ?hgbA1c at 6.7%: controlled ?She has a hx of constipation, GERD and SBO.  ?SBO ias now resolved. ?I advised her about the pros vs cons of GLP-1 injection. Due to her hx of GERD and constipation, she decided not to use GLP1 injection at this time. ?

## 2021-12-15 NOTE — Progress Notes (Signed)
Subjective:  Patient ID: Mackenzie Weber, female    DOB: May 07, 1961  Age: 61 y.o. MRN: [id number]  CC: Acute Visit (Pt would like to discuss benefits of ozempic. /Pt would also like to discuss bowels and how she should move forward since imaging has been done. )  HPI  Type 2 diabetes mellitus with hyperglycemia, without long-term current use of insulin ([place]) Wants to switch from metformin to ozempic injection, to facilitate weight loss and maintain glucose control hgbA1c at 6.7%: controlled She has a hx of constipation, GERD and SBO.  SBO ias now resolved. I advised her about the pros vs cons of GLP-1 injection. Due to her hx of GERD and constipation, she decided not to use GLP1 injection at this time.  Small bowel obstruction ([facility]) resolved  Morbid obesity due to excess calories ([place]) With recent SBO, she is worried about eating vegetables. No exercise regimen due to right foot pain and left knee pain. Wt Readings from Last 3 Encounters:  12/15/21 221 lb 3.2 oz (100.3 kg)  11/16/21 221 lb 12.8 oz (100.6 kg)  11/01/21 226 lb (102.5 kg)   Advised to resume heart healthy diet, maintain small frequent meals, and regular exercise. Exercises recommended: 1796mns per week of water aerobics or recumbent bicycle or chair exercise). Consider sleep study to r/o OSA? F/up in 352month GAD (generalized anxiety disorder) Anxious about her health since recent hospital stay 10/2021 (hospitalized for SBO). Fear to eat certain food, exercise and go far from home. She admits to panic attacks at least 1-2x/day. Improves with deep breathing, prayer and self talk. This also interferes with sleep. She sleep 2-3hrs at a time. Advised about need for medication and psychology appt. She relies on her church family for support and does no want to take any medication at this time. F/up in 96m69monthastroesophageal reflux disease Stable with pantoprazole and pepcid Under the care of Dr. GupLyndel Safeast appt  09/2021. EGD 03/2021: hiatal hernia and barrett's esophagus Last colonoscopy 2020, repeat in 75yr575yrproved constipation with miralax EOD, 2-3BMs per day per patient .last Reviewed past Medical, Social and Family history today.  Outpatient Medications Prior to Visit  Medication Sig Dispense Refill   azelastine (ASTELIN) 0.1 % nasal spray 1-2 sprays  each nostril twice daily as needed (Patient taking differently: Place 1-2 sprays into both nostrils 2 (two) times daily as needed for allergies.) 90 mL 1   blood glucose meter kit and supplies KIT Check blood sugar once daily. One Touch Verio Reflect Meter 1 each 0   Budeson-Glycopyrrol-Formoterol (BREZTRI AEROSPHERE) 160-9-4.8 MCG/ACT AERO Inhale 2 puffs into the lungs daily. Use with spacer. Rinse, gargle and spit out after use. 10.7 g 5   EPINEPHrine (AUVI-Q) 0.3 mg/0.3 mL IJ SOAJ injection Inject 0.3 mg into the muscle as needed for anaphylaxis. 2 each 1   estradiol (ESTRACE) 0.1 MG/GM vaginal cream Place 1 Applicatorful vaginally once a week.     famotidine (PEPCID) 20 MG tablet Take 1 tablet (20 mg total) by mouth at bedtime. 90 tablet 4   glucose blood (ONETOUCH VERIO) test strip USE TO TEST BLOOD SUGAR ONCE DAILY 100 strip 3   ipratropium (ATROVENT) 0.03 % nasal spray Place 2 sprays into both nostrils 3 (three) times daily. (Patient taking differently: Place 2 sprays into both nostrils 3 (three) times daily as needed for rhinitis.) 30 mL 3   Lancets (ONETOUCH DELICA PLUS [id number] USE TO TEST BLOOD SUGAR ONCE DAILY 100 each 3  losartan (COZAAR) 100 MG tablet Take 1 tablet (100 mg total) by mouth daily. 90 tablet 3   menthol-cetylpyridinium (CEPACOL) 3 MG lozenge Take 1 lozenge (3 mg total) by mouth as needed for sore throat. 100 tablet 12   metFORMIN (GLUCOPHAGE-XR) 500 MG 24 hr tablet TAKE 1 TABLET(500 MG) BY MOUTH DAILY WITH BREAKFAST Strength: 500 mg 90 tablet 1   montelukast (SINGULAIR) 10 MG tablet Take 1 tablet (10 mg total) by  mouth at bedtime. FOR SEASONAL ALLERGY (Patient taking differently: Take 10 mg by mouth daily as needed (seasonal allergies).) 30 tablet 5   Multiple Vitamin (MULTIVITAMIN) capsule Take 1 capsule by mouth daily.     NON FORMULARY 2 allergy injections every week     Olopatadine HCl (PATADAY) 0.2 % SOLN Place 1 drop into both eyes daily as needed. (Patient taking differently: Place 1 drop into both eyes daily as needed (itching).) 2.5 mL 5   pantoprazole (PROTONIX) 40 MG tablet Take 1 tablet (40 mg total) by mouth 2 (two) times daily. 180 tablet 3   potassium chloride SA (KLOR-CON M) 20 MEQ tablet Take 1 tablet (20 mEq total) by mouth daily. 30 tablet 0   PROAIR HFA 108 (90 Base) MCG/ACT inhaler INHALE 2 PUFFS BY MOUTH EVERY 4 HOURS AS NEEDED FOR WHEEZING FOR SHORTNESS OF BREATH (Patient taking differently: Inhale 2 puffs into the lungs every 4 (four) hours as needed for wheezing or shortness of breath.) 18 g 1   traMADol (ULTRAM) 50 MG tablet Take 1 tablet (50 mg total) by mouth every 6 (six) hours as needed for moderate pain. 10 tablet 0   triamterene-hydrochlorothiazide (MAXZIDE) 75-50 MG tablet Take 0.5 tablets by mouth daily. 45 tablet 1   No facility-administered medications prior to visit.    ROS See HPI  Objective:  BP 130/76 (BP Location: Left Arm, Patient Position: Sitting, Cuff Size: Large)    Pulse 80    Temp 98.2 F (36.8 C) (Temporal)    Ht 4' 10.5" (1.486 m)    Wt 221 lb 3.2 oz (100.3 kg)    SpO2 100%    BMI 45.44 kg/m   Physical Exam  Assessment & Plan:  This visit occurred during the SARS-CoV-2 public health emergency.  Safety protocols were in place, including screening questions prior to the visit, additional usage of staff PPE, and extensive cleaning of exam room while observing appropriate contact time as indicated for disinfecting solutions.   Zanae was seen today for acute visit.  Diagnoses and all orders for this visit:  Class 3 severe obesity with serious  comorbidity and body mass index (BMI) of 45.0 to 49.9 in adult, unspecified obesity type ([place])  Type 2 diabetes mellitus with hyperglycemia, without long-term current use of insulin ([facility])  Small bowel obstruction ([facility])  GAD (generalized anxiety disorder)  Gastroesophageal reflux disease without esophagitis    Problem List Items Addressed This Visit       Digestive   Gastroesophageal reflux disease    Stable with pantoprazole and pepcid Under the care of Dr. Lyndel Safe. Last appt 09/2021. EGD 03/2021: hiatal hernia and barrett's esophagus Last colonoscopy 2020, repeat in 55yr Improved constipation with miralax EOD, 2-3BMs per day per patient      RESOLVED: Small bowel obstruction ([place]    resolved        Endocrine   Type 2 diabetes mellitus with hyperglycemia, without long-term current use of insulin ([place]    Wants to switch from metformin to ozempic injection, to  facilitate weight loss and maintain glucose control hgbA1c at 6.7%: controlled She has a hx of constipation, GERD and SBO.  SBO ias now resolved. I advised her about the pros vs cons of GLP-1 injection. Due to her hx of GERD and constipation, she decided not to use GLP1 injection at this time.        Other   GAD (generalized anxiety disorder)    Anxious about her health since recent hospital stay 10/2021 (hospitalized for SBO). Fear to eat certain food, exercise and go far from home. She admits to panic attacks at least 1-2x/day. Improves with deep breathing, prayer and self talk. This also interferes with sleep. She sleep 2-3hrs at a time. Advised about need for medication and psychology appt. She relies on her church family for support and does no want to take any medication at this time. F/up in 18month     Morbid obesity due to excess calories ([place]    With recent SBO, she is worried about eating vegetables. No exercise regimen due to right foot pain and left knee pain. Wt Readings from Last 3  Encounters:  12/15/21 221 lb 3.2 oz (100.3 kg)  11/16/21 221 lb 12.8 oz (100.6 kg)  11/01/21 226 lb (102.5 kg)   Advised to resume heart healthy diet, maintain small frequent meals, and regular exercise. Exercises recommended: 1545ms per week of water aerobics or recumbent bicycle or chair exercise). Consider sleep study to r/o OSA? F/up in 75m81month     I have spent 38m42mwith this patient regarding history taking, documentation, review of labs, radiology, specialty notes-GI and Hospitalist, formulating plan and discussing treatment options with patient.   Follow-up: Return in about 3 months (around 03/17/2022) for DM and HTN, hyperlipidemia (fasting).  CharWilfred Lacy

## 2021-12-15 NOTE — Assessment & Plan Note (Addendum)
With recent SBO, she is worried about eating vegetables. No exercise regimen due to right foot pain and left knee pain. ?Wt Readings from Last 3 Encounters:  ?12/15/21 221 lb 3.2 oz (100.3 kg)  ?11/16/21 221 lb 12.8 oz (100.6 kg)  ?11/01/21 226 lb (102.5 kg)  ? ?Advised to resume heart healthy diet, maintain small frequent meals, and regular exercise. Exercises recommended: 15mins per week of water aerobics or recumbent bicycle or chair exercise). ?Consider sleep study to r/o OSA? ?F/up in 83months ?

## 2021-12-15 NOTE — Patient Instructions (Signed)
Maintain current medications ?Maintain 188mins per week of exercise (water aerobics, recumbent bicycle, chair exercise) ? ?Constipation, Adult ?Constipation is when a person has trouble pooping (having a bowel movement). When you have this condition, you may poop fewer than 3 times a week. Your poop (stool) may also be dry, hard, or bigger than normal. ?Follow these instructions at home: ?Eating and drinking ? ?Eat foods that have a lot of fiber, such as: ?Fresh fruits and vegetables. ?Whole grains. ?Beans. ?Eat less of foods that are low in fiber and high in fat and sugar, such as: ?[place] fries. ?Hamburgers. ?Cookies. ?Candy. ?Soda. ?Drink enough fluid to keep your pee (urine) pale yellow. ?General instructions ?Exercise regularly or as told by your doctor. Try to do 150 minutes of exercise each week. ?Go to the restroom when you feel like you need to poop. Do not hold it in. ?Take over-the-counter and prescription medicines only as told by your doctor. These include any fiber supplements. ?When you poop: ?Do deep breathing while relaxing your lower belly (abdomen). ?Relax your pelvic floor. The pelvic floor is a group of muscles that support the rectum, bladder, and intestines (as well as the uterus in women). ?Watch your condition for any changes. Tell your doctor if you notice any. ?Keep all follow-up visits as told by your doctor. This is important. ?Contact a doctor if: ?You have pain that gets worse. ?You have a fever. ?You have not pooped for 4 days. ?You vomit. ?You are not hungry. ?You lose weight. ?You are bleeding from the opening of the butt (anus). ?You have thin, pencil-like poop. ?Get help right away if: ?You have a fever, and your symptoms suddenly get worse. ?You leak poop or have blood in your poop. ?Your belly feels hard or bigger than normal (bloated). ?You have very bad belly pain. ?You feel dizzy or you faint. ?Summary ?Constipation is when a person poops fewer than 3 times a week, has  trouble pooping, or has poop that is dry, hard, or bigger than normal. ?Eat foods that have a lot of fiber. ?Drink enough fluid to keep your pee (urine) pale yellow. ?Take over-the-counter and prescription medicines only as told by your doctor. These include any fiber supplements. ?This information is not intended to replace advice given to you by your health care provider. Make sure you discuss any questions you have with your health care provider. ?Document Revised: 08/19/2019 Document Reviewed: 08/19/2019 ?Elsevier Patient Education ? [place]. ? ?Bowel Obstruction ?A bowel obstruction means that something is blocking the small or large bowel. The bowel is also called the intestine. It is the long tube that connects the stomach to the opening of the butt (anus). ?When something blocks the bowel, food and fluids cannot pass through like normal. This condition needs to be treated. Treatment depends on the cause of the problem and how bad the problem is. ?What are the causes? ?Common causes of this condition include: ?Scar tissue inside the body from past surgery or from high-energy X-rays (radiation). ?Recent surgery in the belly. This affects how food moves in the bowel. ?Some diseases, such as: ?Irritation of the lining of the digestive tract (Crohn's disease). ?Irritation of small pouches in the bowel (diverticulitis). ?Growths or tumors. ?A bulging organ or tissue (hernia). ?Twisting of the bowel (volvulus). ?A swallowed object. ?Slipping of a part of the bowel into another part (intussusception). ?What are the signs or symptoms? ?Symptoms of this condition include: ?Pain in the belly. ?Feeling like you  may vomit (nauseous). ?Vomiting. ?Bloating in the belly. ?Being unable to pass gas. ?Trouble pooping (constipation). ?A lot of belching. ?Watery poop (diarrhea). ?How is this treated? ?Treatment for this condition may include: ?Fluids and pain medicines that are given through an IV tube. Your doctor may  tell you not to eat or drink if you feel like you may vomit or are vomiting. ?Eating a clear liquid diet for a few days. ?Putting a small tube (nasogastric tube) through the nose, down the throat, and into the stomach. This will help with pain, discomfort, and the feeling like you may vomit. ?Surgery. This may be needed if other treatments do not work. ?Follow these instructions at home: ?Medicines ?Take over-the-counter and prescription medicines only as told by your doctor. ?If you were prescribed an antibiotic medicine, take it as told by your doctor. Do not stop taking the antibiotic even if you start to feel better. ?General instructions ?Follow instructions from your doctor about what you can or cannot eat and drink. You may need to: ?Only drink clear liquids until you start to get better. ?Avoid solid foods. ?Return to your normal activities when your doctor says that it is safe. ?Rest as told by your doctor. ?Get up to take short walks every 1 to 2 hours. Ask for help if you feel weak or unsteady. ?Keep all follow-up visits. ?How is this prevented? ?After having a bowel obstruction, you may be more likely to have another. You can do some things to stop it from happening again. ?If you have a long-term (chronic) disease, contact your doctor if you see changes or problems. ?Avoid having trouble pooping. You may need to take these actions to prevent or treat trouble pooping: ?Drink enough fluid to keep your pee (urine) pale yellow. ?Take over-the-counter or prescription medicines. ?Eat foods that are high in fiber. These include beans, whole grains, and fresh fruits and vegetables. ?Limit foods that are high in fat and sugar. These include fried or sweet foods. ?Stay active. Ask your doctor which exercises are safe for you. ?Avoid stress. ?Eat three small meals and three small snacks each day. ?Work with a diet and Building services engineer (dietitian) to make a meal plan that works for you. ?Do not smoke or use any  products that contain nicotine or tobacco. If you need help quitting, ask your doctor. ?Contact a doctor if: ?You have a fever. ?You have chills. ?Get help right away if: ?You have pain or cramps that get worse. ?You vomit blood. ?You feel like you may vomit and the feeling lasts a long time. ?You cannot stop vomiting. ?You cannot drink fluids. ?You feel confused. ?You feel very thirsty (dehydrated). ?Your belly gets more bloated. ?You feel weak or you faint. ?Summary ?A bowel obstruction means that something is blocking the small or large bowel. ?Treatment may include IV fluids and pain medicine. You may also have a clear liquid diet, a small tube in your stomach, or surgery. ?Drink clear liquids and avoid solid foods until you get better, as told by your doctor. ?This information is not intended to replace advice given to you by your health care provider. Make sure you discuss any questions you have with your health care provider. ?Document Revised: 11/13/2020 Document Reviewed: 11/13/2020 ?Elsevier Patient Education ? [place]. ? ?

## 2022-01-12 ENCOUNTER — Other Ambulatory Visit: Payer: Self-pay | Admitting: Nurse Practitioner

## 2022-01-12 DIAGNOSIS — E1165 Type 2 diabetes mellitus with hyperglycemia: Secondary | ICD-10-CM

## 2022-01-25 ENCOUNTER — Ambulatory Visit: Payer: BC Managed Care – PPO | Admitting: Allergy & Immunology

## 2022-01-29 ENCOUNTER — Ambulatory Visit: Payer: BC Managed Care – PPO | Admitting: Podiatry

## 2022-01-31 ENCOUNTER — Ambulatory Visit (INDEPENDENT_AMBULATORY_CARE_PROVIDER_SITE_OTHER): Payer: BC Managed Care – PPO | Admitting: Podiatry

## 2022-01-31 ENCOUNTER — Encounter: Payer: Self-pay | Admitting: Podiatry

## 2022-01-31 ENCOUNTER — Ambulatory Visit (INDEPENDENT_AMBULATORY_CARE_PROVIDER_SITE_OTHER): Payer: BC Managed Care – PPO

## 2022-01-31 DIAGNOSIS — M722 Plantar fascial fibromatosis: Secondary | ICD-10-CM

## 2022-01-31 DIAGNOSIS — M779 Enthesopathy, unspecified: Secondary | ICD-10-CM

## 2022-01-31 MED ORDER — TRIAMCINOLONE ACETONIDE 10 MG/ML IJ SUSP
10.0000 mg | Freq: Once | INTRAMUSCULAR | Status: AC
  Administered 2022-01-31: 10 mg

## 2022-01-31 NOTE — Patient Instructions (Signed)

## 2022-02-01 NOTE — Progress Notes (Signed)
Subjective:  ? ?Patient ID: Mackenzie Weber, female   DOB: 61 y.o.   MRN: [id number]  ? ?HPI ?Patient presents stating she has severe pain in the right plantar heel and it is very bad when she walks and she has flatfoot deformity neuro ? ? ?ROS ? ? ?   ?Objective:  ?Physical Exam  ?Vascular status intact exquisite discomfort noted medial fascial band right at the insertion of the tendon into the calcaneus with fluid buildup with flatfoot deformity noted no signs of joint restriction ? ?   ?Assessment:  ?Acute [place] fasciitis right with inflammation fluid buildup with structural changes ? ?   ?Plan:  ?H&P education rendered concerning acute condition sterile prep and injected the plantar fascia right 3 mg Kenalog 5 mg Xylocaine applied fascial brace to lift up the arch and discussed the utilization of long-term orthotics and we will plan on this once we have this under control.  Reappoint in the next several weeks or earlier if needed ? ?X-rays indicated depressed arch with spur formation plantar aspect heel no indication stress fracture arthritis ?   ? ? ?

## 2022-02-08 ENCOUNTER — Encounter: Payer: Self-pay | Admitting: Allergy & Immunology

## 2022-02-08 ENCOUNTER — Ambulatory Visit (INDEPENDENT_AMBULATORY_CARE_PROVIDER_SITE_OTHER): Payer: BC Managed Care – PPO | Admitting: Allergy & Immunology

## 2022-02-08 VITALS — BP 120/80 | HR 79 | Temp 98.2°F | Resp 18

## 2022-02-08 DIAGNOSIS — K219 Gastro-esophageal reflux disease without esophagitis: Secondary | ICD-10-CM

## 2022-02-08 DIAGNOSIS — J3089 Other allergic rhinitis: Secondary | ICD-10-CM | POA: Diagnosis not present

## 2022-02-08 DIAGNOSIS — J4540 Moderate persistent asthma, uncomplicated: Secondary | ICD-10-CM | POA: Diagnosis not present

## 2022-02-08 DIAGNOSIS — J302 Other seasonal allergic rhinitis: Secondary | ICD-10-CM

## 2022-02-08 NOTE — Progress Notes (Signed)
? ?FOLLOW UP ? ?Date of Service/Encounter:  02/08/22 ? ? ?Assessment:  ? ?Moderate persistent asthma, uncomplicated - with improved control since using her medications as directed ?  ?Perennial and seasonal allergic rhinitis - on allergen immunotherapy but now stopping ?  ?GERD - on Nexium (followed by Dr. Lyndel Safe) ?  ? ?Plan/Recommendations:  ? ? ?1. Moderate persistent asthma without complication ?- Lung testing looks stable today.  ?- We are not going to make any medication changes today.  ?- Daily controller medication(s): Breztri two puffs ONCE daily with spacer (prescribed for TWICE daily) ?- Prior to physical activity: albuterol 2 puffs 10-15 minutes before physical activity. ?- Rescue medications: albuterol 4 puffs every 4-6 hours as needed ?- Changes during respiratory infections or worsening symptoms: Increase Breztri two puffs to 2 puffs twice daily for TWO WEEKS. ?- Asthma control goals:  ?* Full participation in all desired activities (may need albuterol before activity) ?* Albuterol use two time or less a week on average (not counting use with activity) ?* Cough interfering with sleep two time or less a month ?* Oral steroids no more than once a year ?* No hospitalizations ? ?2. Perennial and seasonal allergic rhinitis ?- Continue with the nasal saline lavage. ?- Continue with azelastine one spray per nostril 1-2 times daily as needed.  ?- Continue with Claritin in the morning and Singulair at night.  ?- Since you have been so good without shots, we are going to go ahead and stop them. ?- We can always restart them in the future if needed.  ? ?3. Gastroesophageal reflux disease ?- Continue with Protonix and Pepcid at least once daily. ? ?4. Return in about 3 months (around 05/10/2022).  ? ?Subjective:  ? ?Mackenzie Weber is a 61 y.o. female presenting today for follow up of  ?Chief Complaint  ?Patient presents with  ? Asthma  ? ? ?Mackenzie Weber has a history of the following: ?Patient Active Problem  List  ? Diagnosis Date Noted  ? GAD (generalized anxiety disorder) 12/15/2021  ? Allergic conjunctivitis of both eyes 11/16/2020  ? Allergic conjunctivitis 05/30/2020  ? Moderate persistent asthma 05/30/2020  ? Grade I diastolic dysfunction 00/86/7619  ? Bilateral foot pain 01/13/2020  ? Extensor tendonitis of foot 01/13/2020  ? BMI 40.0-44.9, adult ([place]) 12/23/2019  ? Type 2 diabetes mellitus with hyperglycemia, without long-term current use of insulin ([place]) 01/01/2019  ? Seasonal and perennial allergic rhinitis 05/20/2018  ? Chronic bilateral low back pain with left-sided sciatica 05/20/2018  ? Lipoma of left upper extremity 01/29/2018  ? Muscle spasm of left lower extremity 01/29/2018  ? Intestinal ulcer 04/23/2017  ? Bilateral lower extremity edema 04/23/2017  ? Gastroesophageal reflux disease 03/28/2016  ? History of colon polyps 03/28/2016  ? Anemia 03/28/2016  ? Somatic dysfunction of cervical region 11/16/2015  ? Chronic sciatica 01/28/2015  ? Somatic dysfunction of pelvic region 01/28/2015  ? Chronic pelvic pain in female 01/28/2015  ? Somatic dysfunction of lower extremity 01/28/2015  ? Somatic dysfunction of sacral region 01/28/2015  ? Hypertensive disorder 10/22/2014  ? Morbid obesity due to excess calories ([place]) 10/22/2014  ? Abdominal muscle strain 06/07/2014  ? Sciatica of left side 11/03/2012  ? ? ?History obtained from: chart review and patient. ? ?Mackenzie Weber is a 61 y.o. female presenting for a follow up visit.  She was last seen in January 2023.  At that time, her lung testing was not done.  She was only doing Breztri 2 puffs  once daily and seem to be controlling her symptoms with that.  For her rhinitis, we continue with Astelin and nasal saline.  She continued on allergy shots.  She was endorsing continued symptoms of acute sinusitis, but she was only using Augmentin once a day instead of twice daily.  We added on Biaxin as well.  For her GERD, we continue Protonix and Pepcid. ? ?Since the last  visit, she has done fairly well from a breathing perspective.  Unfortunately, she was in the hospital for around 8 or 9 days due to a small bowel obstruction.  She has a history of multiple adhesions and woke up in the middle the night with intense abdominal pain.  She was admitted to the hospital and put on bowel rest.  There was discussion of doing exploratory surgery, but over time she improved.  She was treated with Dilaudid initially for pain control, but this was decreased.  Eventually she was just discharged without any surgery.  She is feeling much better. ? ?Asthma/Respiratory Symptom History: She remains on the Breztri 2 puffs once daily.  This seems to be working well to control her symptoms.  She has not been using her rescue inhaler at all. She has not been on prednisone for her breathing.  ACT score is 25, indicating excellent asthma control. She feels that things are under good control. ? ?Allergic Rhinitis Symptom History: She remains on her allergy medications including intermittent use of her nasal sprays.  She has not had an allergy shot in several months now because of her hospitalization.  This seems to set things off and messed up her schedule.  However, her symptoms are not worse at all she is interested in just having her allergy shots.  She has been on allergy shots in total since 1997. ? ?GERD Symptom History: Reflux is under good control on her PPI.  She is on Protonix and Pepcid daily. ? ?Otherwise, there have been no changes to her past medical history, surgical history, family history, or social history. ? ? ? ?Review of Systems  ?Constitutional: Negative.  Negative for fever, malaise/fatigue and weight loss.  ?HENT: Negative.  Negative for congestion, ear discharge and ear pain.   ?Eyes:  Negative for pain, discharge and redness.  ?Respiratory:  Negative for cough, sputum production, shortness of breath and wheezing.   ?Cardiovascular: Negative.  Negative for chest pain and  palpitations.  ?Gastrointestinal:  Negative for abdominal pain, constipation, diarrhea, heartburn, nausea and vomiting.  ?Skin: Negative.  Negative for itching and rash.  ?Neurological:  Negative for dizziness and headaches.  ?Endo/Heme/Allergies:  Negative for environmental allergies. Does not bruise/bleed easily.   ? ? ? ?Objective:  ? ?Blood pressure 120/80, pulse 79, temperature 98.2 ?F (36.8 ?C), temperature source Temporal, resp. rate 18, SpO2 98 %. ?There is no height or weight on file to calculate BMI. ? ? ? ?Physical Exam ?Vitals reviewed.  ?Constitutional:   ?   Appearance: She is well-developed.  ?   Comments: Boisterous.  ?HENT:  ?   Head: Normocephalic and atraumatic.  ?   Right Ear: Tympanic membrane, ear canal and external ear normal.  ?   Left Ear: Tympanic membrane, ear canal and external ear normal.  ?   Nose: No nasal deformity, septal deviation, mucosal edema or rhinorrhea.  ?   Right Turbinates: Not enlarged, swollen or pale.  ?   Left Turbinates: Not enlarged, swollen or pale.  ?   Right Sinus: No maxillary sinus tenderness  or frontal sinus tenderness.  ?   Left Sinus: No maxillary sinus tenderness or frontal sinus tenderness.  ?   Mouth/Throat:  ?   Mouth: Mucous membranes are not pale and not dry.  ?   Pharynx: Uvula midline.  ?Eyes:  ?   General: Lids are normal. Allergic shiner present.     ?   Right eye: No discharge.     ?   Left eye: No discharge.  ?   Conjunctiva/sclera: Conjunctivae normal.  ?   Right eye: Right conjunctiva is not injected. No chemosis. ?   Left eye: Left conjunctiva is not injected. No chemosis. ?   Pupils: Pupils are equal, round, and reactive to light.  ?Cardiovascular:  ?   Rate and Rhythm: Normal rate and regular rhythm.  ?   Heart sounds: Normal heart sounds.  ?Pulmonary:  ?   Effort: No tachypnea, accessory muscle usage, respiratory distress or retractions.  ?   Breath sounds: Normal breath sounds. No wheezing, rhonchi or rales.  ?   Comments: Moving air well in  all lung fields. No increased work of breathing noted. No crackles.  ?Chest:  ?   Chest wall: No tenderness.  ?Lymphadenopathy:  ?   Cervical: No cervical adenopathy.  ?Skin: ?   General: Skin is warm.  ?   [place]

## 2022-02-08 NOTE — Patient Instructions (Addendum)
1. Moderate persistent asthma without complication ?- Lung testing looks stable today.  ?- We are not going to make any medication changes today.  ?- Daily controller medication(s): Breztri two puffs ONCE daily with spacer (prescribed for TWICE daily) ?- Prior to physical activity: albuterol 2 puffs 10-15 minutes before physical activity. ?- Rescue medications: albuterol 4 puffs every 4-6 hours as needed ?- Changes during respiratory infections or worsening symptoms: Increase Breztri two puffs to 2 puffs twice daily for TWO WEEKS. ?- Asthma control goals:  ?* Full participation in all desired activities (may need albuterol before activity) ?* Albuterol use two time or less a week on average (not counting use with activity) ?* Cough interfering with sleep two time or less a month ?* Oral steroids no more than once a year ?* No hospitalizations ? ?2. Perennial and seasonal allergic rhinitis ?- Continue with the nasal saline lavage. ?- Continue with azelastine one spray per nostril 1-2 times daily as needed.  ?- Continue with Claritin in the morning and Singulair at night.  ?- Since you have been so good without shots, we are going to go ahead and stop them. ?- We can always restart them in the future if needed.  ? ?3. Gastroesophageal reflux disease ?- Continue with Protonix and Pepcid at least once daily. ? ?5. Return in about 3 months (around 05/10/2022).  ? ? ?Please inform us of any Emergency Department visits, hospitalizations, or changes in symptoms. Call us before going to the ED for breathing or allergy symptoms since we might be able to fit you in for a sick visit. Feel free to contact us anytime with any questions, problems, or concerns. ? ?It was a pleasure to see you today! ? ?Websites that have reliable patient information: ?1. American Academy of Asthma, Allergy, and Immunology: www.aaaai.org ?2. Food Allergy Research and Education (FARE): foodallergy.org ?3. Mothers of Asthmatics:  http://www.asthmacommunitynetwork.org ?4. [employer] of Allergy, Asthma, and Immunology: MonthlyElectricBill.co.uk ? ? ?COVID-19 Vaccine Information can be found at: ShippingScam.co.uk For questions related to vaccine distribution or appointments, please email [email or web address] or call [phone].  ? ?We realize that you might be concerned about having an allergic reaction to the COVID19 vaccines. To help with that concern, WE ARE OFFERING THE COVID19 VACCINES IN OUR OFFICE! Ask the front desk for dates!  ? ? ? ??Like? [place] on Facebook and Instagram for our latest updates!  ?  ? ? ?A healthy democracy works best when [employer] participate! Make sure you are registered to vote! If you have moved or changed any of your contact information, you will need to get this updated before voting! ? ?In some cases, you MAY be able to register to vote online: [email or web address] ? ? ? ? ? ? ?

## 2022-02-09 ENCOUNTER — Other Ambulatory Visit: Payer: Self-pay | Admitting: Podiatry

## 2022-02-09 DIAGNOSIS — M722 Plantar fascial fibromatosis: Secondary | ICD-10-CM

## 2022-02-13 ENCOUNTER — Ambulatory Visit (INDEPENDENT_AMBULATORY_CARE_PROVIDER_SITE_OTHER): Payer: BC Managed Care – PPO | Admitting: Nurse Practitioner

## 2022-02-13 ENCOUNTER — Encounter: Payer: Self-pay | Admitting: Nurse Practitioner

## 2022-02-13 VITALS — BP 114/82 | HR 78 | Temp 97.0°F | Ht 58.5 in | Wt 216.8 lb

## 2022-02-13 DIAGNOSIS — I10 Essential (primary) hypertension: Secondary | ICD-10-CM | POA: Diagnosis not present

## 2022-02-13 DIAGNOSIS — R600 Localized edema: Secondary | ICD-10-CM

## 2022-02-13 DIAGNOSIS — M5432 Sciatica, left side: Secondary | ICD-10-CM

## 2022-02-13 DIAGNOSIS — E1165 Type 2 diabetes mellitus with hyperglycemia: Secondary | ICD-10-CM | POA: Diagnosis not present

## 2022-02-13 DIAGNOSIS — Z6841 Body Mass Index (BMI) 40.0 and over, adult: Secondary | ICD-10-CM

## 2022-02-13 LAB — LIPID PANEL
Cholesterol: 174 mg/dL (ref 0–200)
HDL: 65.7 mg/dL (ref >39.00)
LDL Cholesterol: 99 mg/dL (ref 0–99)
NonHDL: 108.34
Total CHOL/HDL Ratio: 3
Triglycerides: 45 mg/dL (ref 0.0–149.0)
VLDL: 9 mg/dL (ref 0.0–40.0)

## 2022-02-13 LAB — MICROALBUMIN / CREATININE URINE RATIO
Creatinine,U: 85 mg/dL
Microalb Creat Ratio: 1 mg/g (ref 0.0–30.0)
Microalb, Ur: 0.9 mg/dL (ref 0.0–1.9)

## 2022-02-13 LAB — HEMOGLOBIN A1C: Hgb A1c MFr Bld: 6.7 % — ABNORMAL HIGH (ref 4.6–6.5)

## 2022-02-13 MED ORDER — TRIAMTERENE-HCTZ 75-50 MG PO TABS: 0.5000 | ORAL_TABLET | Freq: Every day | ORAL | 1 refills | Status: DC

## 2022-02-13 MED ORDER — CYCLOBENZAPRINE HCL 5 MG PO TABS
5.0000 mg | ORAL_TABLET | Freq: Every day | ORAL | 0 refills | Status: DC
Start: 2022-02-13 — End: 2022-11-13

## 2022-02-13 MED ORDER — NAPROXEN 500 MG PO TABS
500.0000 mg | ORAL_TABLET | Freq: Two times a day (BID) | ORAL | 0 refills | Status: DC
Start: 2022-02-13 — End: 2022-11-13

## 2022-02-13 NOTE — Assessment & Plan Note (Signed)
Has decreased meal portions and started exercise routine 5x/week (rowing machine and stationery bicycle) ?Lost 5lbs in last 107month ?Wt Readings from Last 3 Encounters:  ?02/13/22 216 lb 12.8 oz (98.3 kg)  ?12/15/21 221 lb 3.2 oz (100.3 kg)  ?11/16/21 221 lb 12.8 oz (100.6 kg)  ? ?Encourage to continue current lifestyle modifications ?F/up in 3-639month?

## 2022-02-13 NOTE — Assessment & Plan Note (Signed)
Home glucose: 93-119 ?Up to date with eye exam ?Normal foot exam today ? ?Repeat hgbA1c, lipid panel and urine microalbumin ?F/up in 20month ?

## 2022-02-13 NOTE — Assessment & Plan Note (Signed)
Chronic, waxing and waning, acute exacerbation with increase daily exercise (stationery bicycle and rowing machine). ?No weakness, no paresthesia, no change in GI/GU function. ? ?Provided home exercises ?Sent naproxen and flexeril ?

## 2022-02-13 NOTE — Progress Notes (Signed)
? ?             Established Patient Visit ? ?Patient: Mackenzie Weber   DOB: [birth year]   61 y.o. Female  MRN: [id number] ?Visit Date: 02/13/2022 ? ?Subjective:  ?  ?Chief Complaint  ?Patient presents with  ? Follow-up  ?  2 month f/u on DM, HTN, and cholesterol.  ?Pt checks blood sugars at home and the lowest has been 93 and highest 119 ?Pt is fasting.   ? ?HPI ?Type 2 diabetes mellitus with hyperglycemia, without long-term current use of insulin ([facility]) ?Home glucose: 93-119 ?Up to date with eye exam ?Normal foot exam today ? ?Repeat hgbA1c, lipid panel and urine microalbumin ?F/up in 58month ? ?Sciatica of left side ?Chronic, waxing and waning, acute exacerbation with increase daily exercise (stationery bicycle and rowing machine). ?No weakness, no paresthesia, no change in GI/GU function. ? ?Provided home exercises ?Sent naproxen and flexeril ? ?Morbid obesity due to excess calories ([place] ?Has decreased meal portions and started exercise routine 5x/week (rowing machine and stationery bicycle) ?Lost 5lbs in last 278month?Wt Readings from Last 3 Encounters:  ?02/13/22 216 lb 12.8 oz (98.3 kg)  ?12/15/21 221 lb 3.2 oz (100.3 kg)  ?11/16/21 221 lb 12.8 oz (100.6 kg)  ? ?Encourage to continue current lifestyle modifications ?F/up in 3-31m75month  ?Reviewed medical, surgical, and social history today ? ?Medications: ?Outpatient Medications Prior to Visit  ?Medication Sig  ? azelastine (ASTELIN) 0.1 % nasal spray 1-2 sprays  each nostril twice daily as needed  ? blood glucose meter kit and supplies KIT Check blood sugar once daily. One Touch Verio Reflect Meter  ? Budeson-Glycopyrrol-Formoterol (BREZTRI AEROSPHERE) 160-9-4.8 MCG/ACT AERO Inhale 2 puffs into the lungs daily. Use with spacer. Rinse, gargle and spit out after use.  ? EPINEPHrine (AUVI-Q) 0.3 mg/0.3 mL IJ SOAJ injection Inject 0.3 mg into the muscle as needed for anaphylaxis.  ? estradiol (ESTRACE) 0.1 MG/GM vaginal cream Place 1 Applicatorful vaginally once a  week.  ? famotidine (PEPCID) 20 MG tablet Take 1 tablet (20 mg total) by mouth at bedtime.  ? glucose blood (ONETOUCH VERIO) test strip USE 1 STRIP TO CHECK GLUCOSE ONCE DAILY  ? ipratropium (ATROVENT) 0.03 % nasal spray Place 2 sprays into both nostrils 3 (three) times daily.  ? Lancets (ONETOUCH DELICA PLUS [id number] USE 1  TO CHECK GLUCOSE ONCE DAILY  ? losartan (COZAAR) 100 MG tablet Take 1 tablet (100 mg total) by mouth daily.  ? menthol-cetylpyridinium (CEPACOL) 3 MG lozenge Take 1 lozenge (3 mg total) by mouth as needed for sore throat.  ? metFORMIN (GLUCOPHAGE-XR) 500 MG 24 hr tablet TAKE 1 TABLET(500 MG) BY MOUTH DAILY WITH BREAKFAST Strength: 500 mg  ? montelukast (SINGULAIR) 10 MG tablet Take 1 tablet (10 mg total) by mouth at bedtime. FOR SEASONAL ALLERGY (Patient taking differently: Take 10 mg by mouth daily as needed (seasonal allergies).)  ? Multiple Vitamin (MULTIVITAMIN) capsule Take 1 capsule by mouth daily.  ? NON FORMULARY 2 allergy injections every week  ? Olopatadine HCl (PATADAY) 0.2 % SOLN Place 1 drop into both eyes daily as needed.  ? pantoprazole (PROTONIX) 40 MG tablet Take 1 tablet (40 mg total) by mouth 2 (two) times daily.  ? PROAIR HFA 108 (90 Base) MCG/ACT inhaler INHALE 2 PUFFS BY MOUTH EVERY 4 HOURS AS NEEDED FOR WHEEZING FOR SHORTNESS OF BREATH (Patient taking differently: Inhale 2 puffs into the lungs every 4 (four) hours as needed for wheezing or  shortness of breath.)  ? traMADol (ULTRAM) 50 MG tablet Take 1 tablet (50 mg total) by mouth every 6 (six) hours as needed for moderate pain.  ? triamterene-hydrochlorothiazide (MAXZIDE) 75-50 MG tablet Take 0.5 tablets by mouth daily.  ? potassium chloride SA (KLOR-CON M) 20 MEQ tablet Take 1 tablet (20 mEq total) by mouth daily.  ? ?No facility-administered medications prior to visit.  ? ?Reviewed past medical and social history.  ? ?ROS per HPI above ? ? ?   ?Objective:  ?BP 114/82 (BP Location: Left Arm, Patient Position:  Sitting, Cuff Size: Large)   Pulse 78   Temp (!) 97 ?F (36.1 ?C) (Temporal)   Ht 4' 10.5" (1.486 m)   Wt 216 lb 12.8 oz (98.3 kg)   SpO2 99%   BMI 44.54 kg/m?  ? ?  ? ?Physical Exam  ?No results found for any visits on 02/13/22. ?   ?Assessment & Plan:  ?  ?Problem List Items Addressed This Visit   ? ?  ? Cardiovascular and Mediastinum  ? Hypertensive disorder  ?  ? Endocrine  ? Type 2 diabetes mellitus with hyperglycemia, without long-term current use of insulin ([facility]) - [facility]  ?  Home glucose: 93-119 ?Up to date with eye exam ?Normal foot exam today ? ?Repeat hgbA1c, lipid panel and urine microalbumin ?F/up in 58month ? ?  ?  ? Relevant Orders  ? Lipid panel  ? Hemoglobin A1c  ? Urine Albumin-Creatinine with uACR  ?  ? Nervous and Auditory  ? Sciatica of left side  ?  Chronic, waxing and waning, acute exacerbation with increase daily exercise (stationery bicycle and rowing machine). ?No weakness, no paresthesia, no change in GI/GU function. ? ?Provided home exercises ?Sent naproxen and flexeril ? ?  ?  ? Relevant Medications  ? naproxen (NAPROSYN) 500 MG tablet  ? cyclobenzaprine (FLEXERIL) 5 MG tablet  ?  ? Other  ? Morbid obesity due to excess calories ([place]  ?  Has decreased meal portions and started exercise routine 5x/week (rowing machine and stationery bicycle) ?Lost 5lbs in last 249month?Wt Readings from Last 3 Encounters:  ?02/13/22 216 lb 12.8 oz (98.3 kg)  ?12/15/21 221 lb 3.2 oz (100.3 kg)  ?11/16/21 221 lb 12.8 oz (100.6 kg)  ? ?Encourage to continue current lifestyle modifications ?F/up in 3-38m37month  ?  ? ?Return in about 6 months (around 08/16/2022) for DM and HTN, hyperlipidemia. ? ?  ? ?[name] ? ? ?

## 2022-02-13 NOTE — Patient Instructions (Addendum)
Use naproxen and flexeril for back pain ?Start daily back exercise. ? ?Go to lab ? ?Sign medical release to get records from GYN ? ?Sciatica Rehab ?Ask your health care provider which exercises are safe for you. Do exercises exactly as told by your health care provider and adjust them as directed. It is normal to feel mild stretching, pulling, tightness, or discomfort as you do these exercises. Stop right away if you feel sudden pain or your pain gets worse. Do not begin these exercises until told by your health care provider. ?Stretching and range-of-motion exercises ?These exercises warm up your muscles and joints and improve the movement and flexibility of your hips and back. These exercises also help to relieve pain, numbness, and tingling. ?Sciatic nerve glide ?Sit in a chair with your head facing down toward your chest. Place your hands behind your back. Let your shoulders slump forward. ?Slowly straighten one of your legs while you tilt your head back as if you are looking toward the ceiling. Only straighten your leg as far as you can without making your symptoms worse. ?Hold this position for __________ seconds. ?Slowly return to the starting position. ?Repeat with your other leg. ?Repeat __________ times. Complete this exercise __________ times a day. ?Knee to chest with hip adduction and internal rotation ? ?Lie on your back on a firm surface with both legs straight. ?Bend one of your knees and move it up toward your chest until you feel a gentle stretch in your lower back and buttock. Then, move your knee toward the shoulder that is on the opposite side from your leg. This is hip adduction and internal rotation. ?Hold your leg in this position by holding on to the front of your knee. ?Hold this position for __________ seconds. ?Slowly return to the starting position. ?Repeat with your other leg. ?Repeat __________ times. Complete this exercise __________ times a day. ?Prone extension on elbows ? ?Lie on  your abdomen on a firm surface. A bed may be too soft for this exercise. ?Prop yourself up on your elbows. ?Use your arms to help lift your chest up until you feel a gentle stretch in your abdomen and your lower back. ?This will place some of your body weight on your elbows. If this is uncomfortable, try stacking pillows under your chest. ?Your hips should stay down, against the surface that you are lying on. Keep your hip and back muscles relaxed. ?Hold this position for __________ seconds. ?Slowly relax your upper body and return to the starting position. ?Repeat __________ times. Complete this exercise __________ times a day. ?Strengthening exercises ?These exercises build strength and endurance in your back. Endurance is the ability to use your muscles for a long time, even after they get tired. ?Pelvic tilt ?This exercise strengthens the muscles that lie deep in the abdomen. ?Lie on your back on a firm surface. Bend your knees and keep your feet flat on the floor. ?Tense your abdominal muscles. Tip your pelvis up toward the ceiling and flatten your lower back into the floor. ?To help with this exercise, you may place a small towel under your lower back and try to push your back into the towel. ?Hold this position for __________ seconds. ?Let your muscles relax completely before you repeat this exercise. ?Repeat __________ times. Complete this exercise __________ times a day. ?Alternating arm and leg raises ? ?Get on your hands and knees on a firm surface. If you are on a hard floor, you may want to  use padding, such as an exercise mat, to cushion your knees. ?Line up your arms and legs. Your hands should be directly below your shoulders, and your knees should be directly below your hips. ?Lift your left leg behind you. At the same time, raise your right arm and straighten it in front of you. ?Do not lift your leg higher than your hip. ?Do not lift your arm higher than your shoulder. ?Keep your abdominal and  back muscles tight. ?Keep your hips facing the ground. ?Do not arch your back. ?Keep your balance carefully, and do not hold your breath. ?Hold this position for __________ seconds. ?Slowly return to the starting position. ?Repeat with your right leg and your left arm. ?Repeat __________ times. Complete this exercise __________ times a day. ?Posture and body mechanics ?Good posture and healthy body mechanics can help to relieve stress in your body's tissues and joints. Body mechanics refers to the movements and positions of your body while you do your daily activities. Posture is part of body mechanics. Good posture means: ?Your spine is in its natural S-curve position (neutral). ?Your shoulders are pulled back slightly. ?Your head is not tipped forward. ?Follow these guidelines to improve your posture and body mechanics in your everyday activities. ?Standing ? ?When standing, keep your spine neutral and your feet about hip width apart. Keep a slight bend in your knees. Your ears, shoulders, and hips should line up. ?When you do a task in which you stand in one place for a long time, place one foot up on a stable object that is 2-4 inches (5-10 cm) high, such as a footstool. This helps keep your spine neutral. ?Sitting ? ?When sitting, keep your spine neutral and keep your feet flat on the floor. Use a footrest, if necessary, and keep your thighs parallel to the floor. Avoid rounding your shoulders, and avoid tilting your head forward. ?When working at a desk or a computer, keep your desk at a height where your hands are slightly lower than your elbows. Slide your chair under your desk so you are close enough to maintain good posture. ?When working at a computer, place your monitor at a height where you are looking straight ahead and you do not have to tilt your head forward or downward to look at the screen. ?Resting ?When lying down and resting, avoid positions that are most painful for you. ?If you have pain with  activities such as sitting, bending, stooping, or squatting, lie in a position in which your body does not bend very much. For example, avoid curling up on your side with your arms and knees near your chest (fetal position). ?If you have pain with activities such as standing for a long time or reaching with your arms, lie with your spine in a neutral position and bend your knees slightly. Try the following positions: ?Lying on your side with a pillow between your knees. ?Lying on your back with a pillow under your knees. ?Lifting ? ?When lifting objects, keep your feet at least shoulder width apart and tighten your abdominal muscles. ?Bend your knees and hips and keep your spine neutral. It is important to lift using the strength of your legs, not your back. Do not lock your knees straight out. ?Always ask for help to lift heavy or awkward objects. ?This information is not intended to replace advice given to you by your health care provider. Make sure you discuss any questions you have with your health care provider. ?Document  Revised: 01/23/2019 Document Reviewed: 10/23/2018 ?Elsevier Patient Education ? [place]. ? ?

## 2022-02-14 ENCOUNTER — Ambulatory Visit (INDEPENDENT_AMBULATORY_CARE_PROVIDER_SITE_OTHER): Payer: BC Managed Care – PPO | Admitting: Podiatry

## 2022-02-14 ENCOUNTER — Encounter: Payer: Self-pay | Admitting: Podiatry

## 2022-02-14 ENCOUNTER — Ambulatory Visit (INDEPENDENT_AMBULATORY_CARE_PROVIDER_SITE_OTHER): Payer: BC Managed Care – PPO

## 2022-02-14 DIAGNOSIS — M722 Plantar fascial fibromatosis: Secondary | ICD-10-CM

## 2022-02-14 NOTE — Progress Notes (Signed)
Subjective:  ? ?Patient ID: Mackenzie Weber, female   DOB: 61 y.o.   MRN: [id number]  ? ?HPI ?Patient states that she is improving but she had this pain for a long time before we worked on ? ? ?ROS ? ? ?   ?Objective:  ?Physical Exam  ?Neurovascular status found to be intact muscle strength found to be adequate with patient having a depressed arch bilateral and diminished discomfort but pain still present in the medial band of the fascia right ? ?   ?Assessment:  ?Planter fasciitis right improving but still present with significant structural malalignment ? ?   ?Plan:  ?H&P reviewed condition and I have recommended conservative care consisting of orthotics long-term to support the arch and take pressure off the arch structure.  Patient wants to have these made and is casted today for functional orthotics continue bracing till seen back ?   ? ? ?

## 2022-02-14 NOTE — Progress Notes (Signed)
SITUATION ?Reason for Consult: Evaluation for Bilateral Custom Foot Orthoses ?Patient / Caregiver Report: Patient is ready for foot orthotics ? ?OBJECTIVE DATA: ?Patient History / Diagnosis:  ?  ICD-10-CM   ?1. Plantar fasciitis  M72.2   ?  ? ? ?Current or Previous Devices:   None and no history ? ?Foot Examination: ?Skin presentation:   Intact ?Ulcers & Callousing:   None ?Toe / Foot Deformities:  None ?Weight Bearing Presentation:  Rectus ?Sensation:    Intact ? ?Shoe Size:    39M ? ?ORTHOTIC RECOMMENDATION ?Recommended Device: 1x pair of custom functional foot orthotics ? ?GOALS OF ORTHOSES ?- Reduce Pain ?- Prevent Foot Deformity ?- Prevent Progression of Further Foot Deformity ?- Relieve Pressure ?- Improve the Overall Biomechanical Function of the Foot and Lower Extremity. ? ?ACTIONS PERFORMED ?Potential out of pocket cost was communicated to patient. Patient understood and consent to casting. Patient was casted for Foot Orthoses via crush box. Procedure was explained and patient tolerated procedure well. Casts were shipped to central fabrication. All questions were answered and concerns addressed. ? ?PLAN ?Patient is to be called for fitting when devices are ready.  ? ? ?

## 2022-02-23 ENCOUNTER — Other Ambulatory Visit: Payer: Self-pay | Admitting: Nurse Practitioner

## 2022-02-23 DIAGNOSIS — I10 Essential (primary) hypertension: Secondary | ICD-10-CM

## 2022-03-07 ENCOUNTER — Other Ambulatory Visit: Payer: BC Managed Care – PPO

## 2022-03-07 ENCOUNTER — Ambulatory Visit: Payer: BC Managed Care – PPO

## 2022-03-07 DIAGNOSIS — M779 Enthesopathy, unspecified: Secondary | ICD-10-CM

## 2022-03-07 DIAGNOSIS — M722 Plantar fascial fibromatosis: Secondary | ICD-10-CM

## 2022-03-07 NOTE — Progress Notes (Signed)
SITUATION: Reason for Visit: Fitting and Delivery of Custom Fabricated Foot Orthoses Patient Report: Patient reports comfort and is satisfied with device.  OBJECTIVE DATA: Patient History / Diagnosis:     ICD-10-CM   1. Plantar fasciitis  M72.2     2. Tendonitis  M77.9       Provided Device:  Custom Functional Foot Orthotics     [facility]: [id number]  GOAL OF ORTHOSIS - Improve gait - Decrease energy expenditure - Improve Balance - Provide Triplanar stability of foot complex - Facilitate motion  ACTIONS PERFORMED Patient was fit with foot orthotics trimmed to shoe last. Patient tolerated fittign procedure.   Patient was provided with verbal and written instruction and demonstration regarding donning, doffing, wear, care, proper fit, function, purpose, cleaning, and use of the orthosis and in all related precautions and risks and benefits regarding the orthosis.  Patient was also provided with verbal instruction regarding how to report any failures or malfunctions of the orthosis and necessary follow up care. Patient was also instructed to contact our office regarding any change in status that may affect the function of the orthosis.  Patient demonstrated independence with proper donning, doffing, and fit and verbalized understanding of all instructions.  PLAN: Patient is to follow up in one week or as necessary (PRN). All questions were answered and concerns addressed. Plan of care was discussed with and agreed upon by the patient.

## 2022-05-16 LAB — HM DIABETES EYE EXAM

## 2022-05-22 ENCOUNTER — Encounter: Payer: Self-pay | Admitting: Allergy & Immunology

## 2022-05-22 ENCOUNTER — Ambulatory Visit (INDEPENDENT_AMBULATORY_CARE_PROVIDER_SITE_OTHER): Payer: BC Managed Care – PPO | Admitting: Allergy & Immunology

## 2022-05-22 VITALS — BP 100/58 | HR 77 | Temp 97.5°F | Resp 12 | Wt 222.0 lb

## 2022-05-22 DIAGNOSIS — K219 Gastro-esophageal reflux disease without esophagitis: Secondary | ICD-10-CM

## 2022-05-22 DIAGNOSIS — J4540 Moderate persistent asthma, uncomplicated: Secondary | ICD-10-CM | POA: Diagnosis not present

## 2022-05-22 DIAGNOSIS — J3089 Other allergic rhinitis: Secondary | ICD-10-CM

## 2022-05-22 DIAGNOSIS — J302 Other seasonal allergic rhinitis: Secondary | ICD-10-CM

## 2022-05-22 MED ORDER — ALBUTEROL SULFATE HFA 108 (90 BASE) MCG/ACT IN AERS: 2.0000 | INHALATION_SPRAY | Freq: Four times a day (QID) | RESPIRATORY_TRACT | 1 refills | Status: DC | PRN

## 2022-05-22 MED ORDER — BREZTRI AEROSPHERE 160-9-4.8 MCG/ACT IN AERO: 2.0000 | INHALATION_SPRAY | Freq: Every day | RESPIRATORY_TRACT | 5 refills | Status: DC

## 2022-05-22 NOTE — Progress Notes (Signed)
FOLLOW UP  Date of Service/Encounter:  05/22/22   Assessment:   Moderate persistent asthma, uncomplicated - with improved control since using her medications as directed   Perennial and seasonal allergic rhinitis - on allergen immunotherapy but now stopping   GERD - on Nexium and Pepcid (followed by Dr. Lyndel Safe)  Mackenzie Weber and Grants Manager   Mackenzie Weber is doing remarkably well since I saw her last time.  We were able to stop her shots successfully and she has been able to wean aggressively off of her daily allergy medications.  The use of her rescue medication as well as her maintenance medication for her asthma has decreased as well.  She essentially has adopted a SMART regimen with the use of her Breztri.  This seems to be working well.  Those decades of allergy shots seem to have helped.  She has really never been in a better place in a number of years.  She is motivated to return to the gym, which will only help her lung function.  Plan/Recommendations:   1. Moderate persistent asthma without complication - Lung testing looks stable today.  - We are not going to make any medication changes today.  - Daily controller medication(s): Breztri two puffs ONCE daily with spacer (prescribed for TWICE daily) - Prior to physical activity: albuterol 2 puffs 10-15 minutes before physical activity. - Rescue medications: albuterol 4 puffs every 4-6 hours as needed - Changes during respiratory infections or worsening symptoms: Increase Breztri two puffs to 2 puffs twice daily for TWO WEEKS. - Asthma control goals:  * Full participation in all desired activities (may need albuterol before activity) * Albuterol use two time or less a week on average (not counting use with activity) * Cough interfering with sleep two time or less a month * Oral steroids no more than once a year * No hospitalizations  2. Perennial and seasonal allergic rhinitis - Continue with the nasal saline lavage. - You seem to  have everything under excellent control. - We aren ot going to make any medication changes right now.  3. Gastroesophageal reflux disease - Continue with Protonix and Pepcid at least once daily.  - Get back on the bicycle and stop eating late at night.    4. Return in about 6 months (around 11/22/2022).     Subjective:   Mackenzie Weber is a 61 y.o. female presenting today for follow up of  Chief Complaint  Patient presents with   Asthma    Good   Allergic Rhinitis     Pretty Good   Other    Nauseous cause unknown could be sinus drainage or acid reflux     Mackenzie Weber has a history of the following: Patient Active Problem List   Diagnosis Date Noted   GAD (generalized anxiety disorder) 12/15/2021   Allergic conjunctivitis of both eyes 11/16/2020   Allergic conjunctivitis 05/30/2020   Moderate persistent asthma 05/30/2020   Grade I diastolic dysfunction 43/32/9518   Bilateral foot pain 01/13/2020   Extensor tendonitis of foot 01/13/2020   BMI 40.0-44.9, adult ([place]) 12/23/2019   Type 2 diabetes mellitus with hyperglycemia, without long-term current use of insulin ([place]) 01/01/2019   Seasonal and perennial allergic rhinitis 05/20/2018   Chronic bilateral low back pain with left-sided sciatica 05/20/2018   Lipoma of left upper extremity 01/29/2018   Muscle spasm of left lower extremity 01/29/2018   Intestinal ulcer 04/23/2017   Bilateral lower extremity edema 04/23/2017   Gastroesophageal reflux  disease 03/28/2016   History of colon polyps 03/28/2016   Anemia 03/28/2016   Somatic dysfunction of cervical region 11/16/2015   Chronic sciatica 01/28/2015   Somatic dysfunction of pelvic region 01/28/2015   Chronic pelvic pain in female 01/28/2015   Somatic dysfunction of lower extremity 01/28/2015   Somatic dysfunction of sacral region 01/28/2015   Hypertensive disorder 10/22/2014   Morbid obesity due to excess calories ([place]) 10/22/2014   Abdominal muscle strain  06/07/2014   Sciatica of left side 11/03/2012    History obtained from: chart review and patient.  Mackenzie Weber is a 61 y.o. female presenting for a follow up visit.  She was last seen in April 2023.  At that time, her lung testing looks stable.  We did not make any medication changes.  We continue with Breztri 2 puffs once daily with her spacer.  This increases to twice daily during flares.  For her rhinitis, we continued with Astelin as well as Claritin and Singulair.  She was doing great without her shots.  Since the last visit, she has mostly done well.   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 Their goal is to assist people with needs to lead their lives and become more self sufficient. 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 She does reports and whatnot for the funders. When she worked at [employer], she [employer]. 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   Asthma/Respiratory Symptom History: She is doing well with her breathing. She is not using the Seth Ward on a routine basis. She uses her albuterol very rarely. She did have prednisone for her knee, but otherwise she has done well. She did use some of the prednisone, but she did not take the entire taper.  She has not been to the ED nor has she needed systemic prednisone for her symptoms.   Allergic Rhinitis Symptom History: She is only on Allegra daily. She is no longer using the montelukast at all. She has been great since coming out of the hospital.  She has not been on allergy shots since January 2023. She has not gone this long without allergy shots since 1997. She started at [facility] and then she established care with Dr. Verlin Fester prior to his leaving the practice.   She has not had any other SBOs. She had the nearly two weeks of hospitalization in January 2023, but she has felt very good since that  time.  She feels that this time the hospital help cleanse her system.  Regardless, she has felt great since she was in the hospital.  She has had a really good year.  They have been traveling quite a bit including 2 trips to [facility] as well as a couple of other trips.  They have been celebrating her 61th birthday and style.  She is planning to get back to the gym.  She put on a little bit of weight this year.  She and her husband have been married for 42 years.  Otherwise, there have been no changes to her past medical history, surgical history, family history, or social history.    Review of Systems  Constitutional: Negative.  Negative for fever, malaise/fatigue and weight loss.  HENT: Negative.  Negative for congestion, ear discharge and ear pain.   Eyes:  Negative for pain, discharge and  redness.  Respiratory:  Negative for cough, sputum production, shortness of breath and wheezing.   Cardiovascular: Negative.  Negative for chest pain and palpitations.  Gastrointestinal:  Negative for abdominal pain, constipation, diarrhea, heartburn, nausea and vomiting.  Skin: Negative.  Negative for itching and rash.  Neurological:  Negative for dizziness and headaches.  Endo/Heme/Allergies:  Negative for environmental allergies. Does not bruise/bleed easily.       Objective:   Blood pressure (!) 100/58, pulse 77, temperature (!) 97.5 F (36.4 C), temperature source Temporal, resp. rate 12, weight 222 lb (100.7 kg), SpO2 100 %. Body mass index is 45.61 kg/m.    Physical Exam Vitals reviewed.  Constitutional:      Appearance: She is well-developed.     Comments: Boisterous. Delightful as always.  HENT:     Head: Normocephalic and atraumatic.     Right Ear: Tympanic membrane, ear canal and external ear normal.     Left Ear: Tympanic membrane, ear canal and external ear normal.     Nose: No nasal deformity, septal deviation, mucosal edema or rhinorrhea.     Right Turbinates: Enlarged  and swollen. Not pale.     Left Turbinates: Enlarged and swollen. Not pale.     Right Sinus: No maxillary sinus tenderness or frontal sinus tenderness.     Left Sinus: No maxillary sinus tenderness or frontal sinus tenderness.     Mouth/Throat:     Mouth: Mucous membranes are not pale and not dry.     Pharynx: Uvula midline.  Eyes:     General: Lids are normal. Allergic shiner present.        Right eye: No discharge.        Left eye: No discharge.     Conjunctiva/sclera: Conjunctivae normal.     Right eye: Right conjunctiva is not injected. No chemosis.    Left eye: Left conjunctiva is not injected. No chemosis.    Pupils: Pupils are equal, round, and reactive to light.  Cardiovascular:     Rate and Rhythm: Normal rate and regular rhythm.     Heart sounds: Normal heart sounds.  Pulmonary:     Effort: No tachypnea, accessory muscle usage, respiratory distress or retractions.     Breath sounds: Normal breath sounds. No wheezing, rhonchi or rales.     Comments: Moving air well in all lung fields. No increased work of breathing noted. No crackles.  Chest:     Chest wall: No tenderness.  Lymphadenopathy:     Cervical: No cervical adenopathy.  Skin:    General: Skin is warm.     Capillary Refill: Capillary refill takes less than 2 seconds.     Coloration: Skin is not pale.     Findings: No abrasion, erythema, petechiae or rash. Rash is not papular, urticarial or vesicular.  Neurological:     Mental Status: She is alert.  Psychiatric:        Behavior: Behavior is cooperative.      Diagnostic studies:    Spirometry: results abnormal (FEV1: 1.20/64%, FVC: 1.62/69%, FEV1/FVC: 74%).    Spirometry consistent with possible restrictive disease.  Both her FEV1 and FVC have increased since the last visit, although they are not completely normal.   Allergy Studies: none        Salvatore Marvel, MD  [facility] [place] [facility]

## 2022-05-22 NOTE — Patient Instructions (Addendum)
1. Moderate persistent asthma without complication - Lung testing looks stable today.  - We are not going to make any medication changes today.  - Daily controller medication(s): Breztri two puffs ONCE daily with spacer (prescribed for TWICE daily) - Prior to physical activity: albuterol 2 puffs 10-15 minutes before physical activity. - Rescue medications: albuterol 4 puffs every 4-6 hours as needed - Changes during respiratory infections or worsening symptoms: Increase Breztri two puffs to 2 puffs twice daily for TWO WEEKS. - Asthma control goals:  * Full participation in all desired activities (may need albuterol before activity) * Albuterol use two time or less a week on average (not counting use with activity) * Cough interfering with sleep two time or less a month * Oral steroids no more than once a year * No hospitalizations  2. Perennial and seasonal allergic rhinitis - Continue with the nasal saline lavage. - You seem to have everything under excellent control. - We aren ot going to make any medication changes right now.  3. Gastroesophageal reflux disease - Continue with Protonix and Pepcid at least once daily.  - Get back on the bicycle and stop eating late at night.    4. Return in about 6 months (around 11/22/2022).    Please inform us of any Emergency Department visits, hospitalizations, or changes in symptoms. Call us before going to the ED for breathing or allergy symptoms since we might be able to fit you in for a sick visit. Feel free to contact us anytime with any questions, problems, or concerns.  It was a pleasure to see you today!  Websites that have reliable patient information: 1. American Academy of Asthma, Allergy, and Immunology: www.aaaai.org 2. Food Allergy Research and Education (FARE): foodallergy.org 3. Mothers of Asthmatics: http://www.asthmacommunitynetwork.org 4. American College of Allergy, Asthma, and Immunology: www.acaai.org   COVID-19 Vaccine  Information can be found at: ShippingScam.co.uk For questions related to vaccine distribution or appointments, please email [email or web address] or call [phone].   We realize that you might be concerned about having an allergic reaction to the COVID19 vaccines. To help with that concern, WE ARE OFFERING THE COVID19 VACCINES IN OUR OFFICE! Ask the front desk for dates!     "Like" [place] on Facebook and Instagram for our latest updates!      A healthy democracy works best when [employer] participate! Make sure you are registered to vote! If you have moved or changed any of your contact information, you will need to get this updated before voting!  In some cases, you MAY be able to register to vote online: [email or web address]

## 2022-05-23 ENCOUNTER — Telehealth: Payer: Self-pay | Admitting: Nurse Practitioner

## 2022-05-23 NOTE — Telephone Encounter (Signed)
Pt advised.

## 2022-05-23 NOTE — Telephone Encounter (Signed)
Caller Name: Sherol Sabas Call back phone #: [phone]  Reason for Call: Pt went to a dr's appt yesterday (8/08) and bp was low. Requested a call back to see if she was needing to be seen by Nche.

## 2022-05-23 NOTE — Telephone Encounter (Signed)
Called & spoke w/ pt, says when she went to her allergist appt yesterday 05/22/22, and her BP was low (90/60). Before she left, it went up to 100/70. Pt denies any feels of lightheadedness or dizziness, was just concerned about it being that low and wondered if it was okay. Offered pt appt, she declined. Adv pt that if she begins to feel any of those symptoms, she is to visit the ER or Urgent care. Please advise.

## 2022-05-28 ENCOUNTER — Other Ambulatory Visit: Payer: Self-pay | Admitting: Orthopedic Surgery

## 2022-05-28 DIAGNOSIS — M5416 Radiculopathy, lumbar region: Secondary | ICD-10-CM

## 2022-05-29 ENCOUNTER — Encounter: Payer: Self-pay | Admitting: Nurse Practitioner

## 2022-05-29 DIAGNOSIS — E11319 Type 2 diabetes mellitus with unspecified diabetic retinopathy without macular edema: Secondary | ICD-10-CM | POA: Insufficient documentation

## 2022-06-03 ENCOUNTER — Other Ambulatory Visit: Payer: BC Managed Care – PPO

## 2022-06-05 ENCOUNTER — Other Ambulatory Visit: Payer: BC Managed Care – PPO

## 2022-06-11 ENCOUNTER — Ambulatory Visit
Admission: RE | Admit: 2022-06-11 | Discharge: 2022-06-11 | Disposition: A | Discharge status: Home / Self Care | Payer: BC Managed Care – PPO | Source: Ambulatory Visit | Attending: Orthopedic Surgery | Admitting: Orthopedic Surgery

## 2022-06-11 DIAGNOSIS — M5416 Radiculopathy, lumbar region: Secondary | ICD-10-CM

## 2022-06-25 ENCOUNTER — Telehealth: Payer: Self-pay | Admitting: Nurse Practitioner

## 2022-06-25 DIAGNOSIS — I10 Essential (primary) hypertension: Secondary | ICD-10-CM

## 2022-06-25 MED ORDER — LOSARTAN POTASSIUM 100 MG PO TABS: 100.0000 mg | ORAL_TABLET | Freq: Every day | ORAL | 0 refills | Status: DC

## 2022-06-25 NOTE — Telephone Encounter (Signed)
Pt needs her losartin refilled [place] in [place].

## 2022-07-01 ENCOUNTER — Other Ambulatory Visit: Payer: Self-pay | Admitting: Family Medicine

## 2022-07-23 IMAGING — DX DG ABDOMEN 2V
3 series · 3 of 3 positions shown · non-contrast
Comparison: Abdominal radiograph 11/04/2021.

CLINICAL DATA: Small bowel obstruction.

EXAM:
ABDOMEN - 2 VIEW

[abdomen erect]
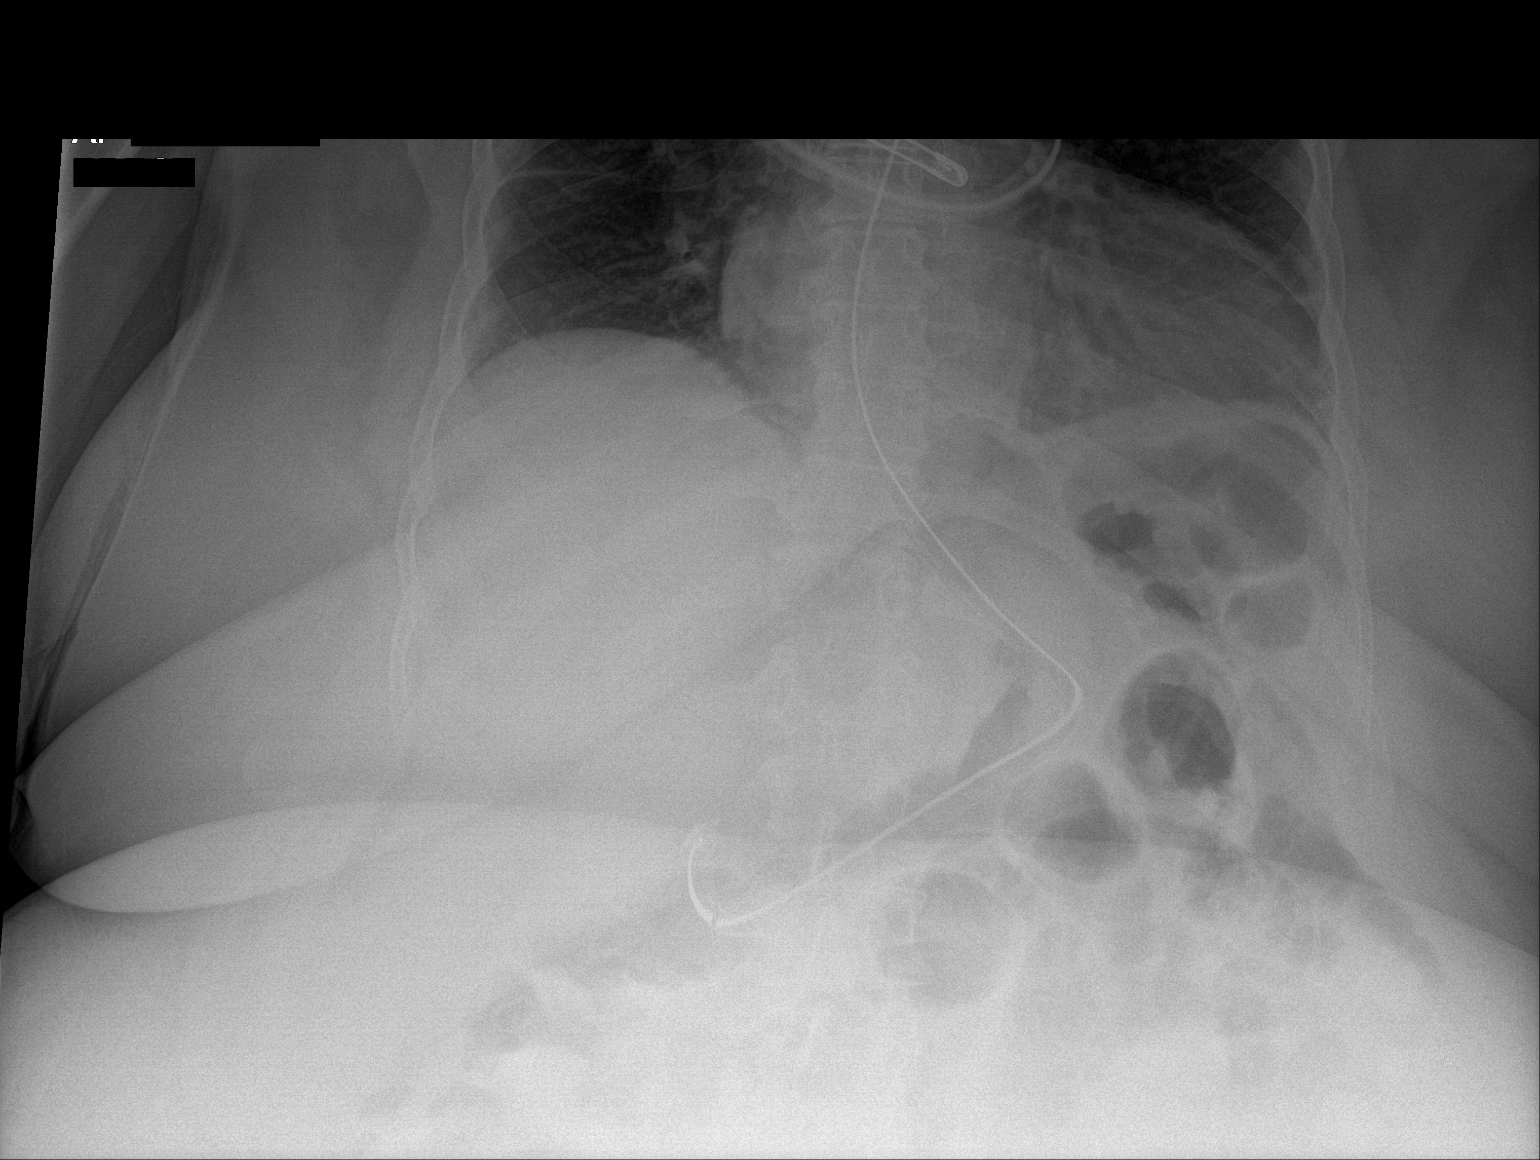

[abdomen supine (1 of 2)]
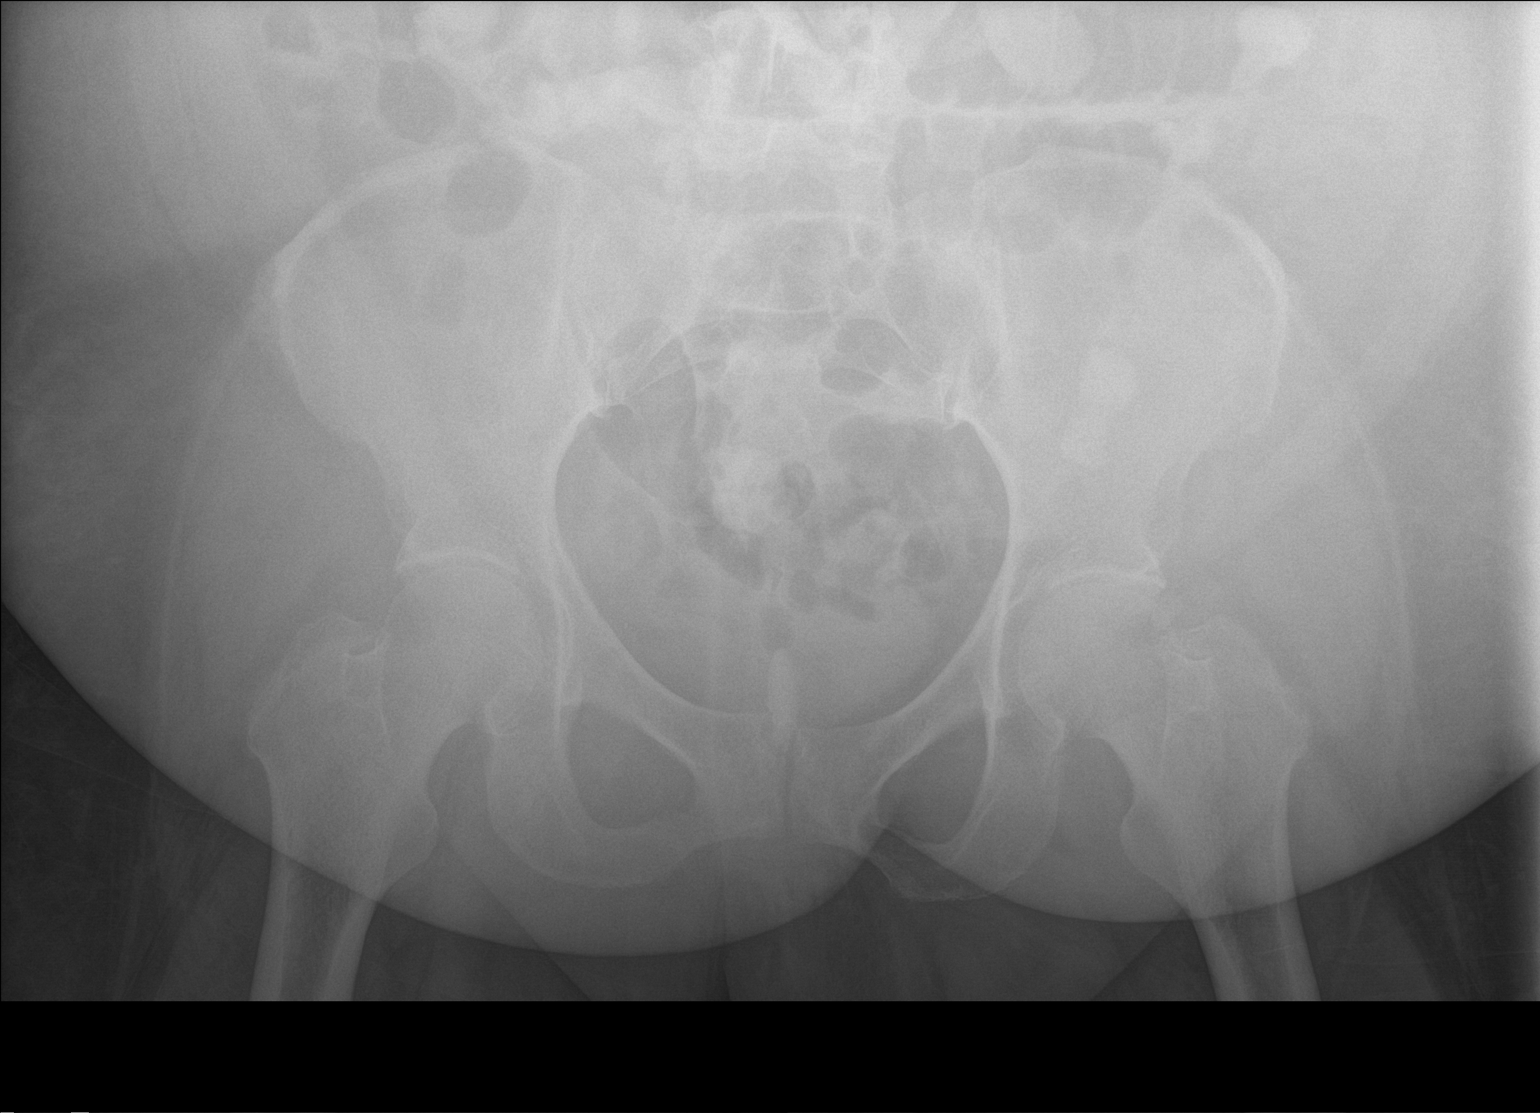

[abdomen supine (2 of 2)]
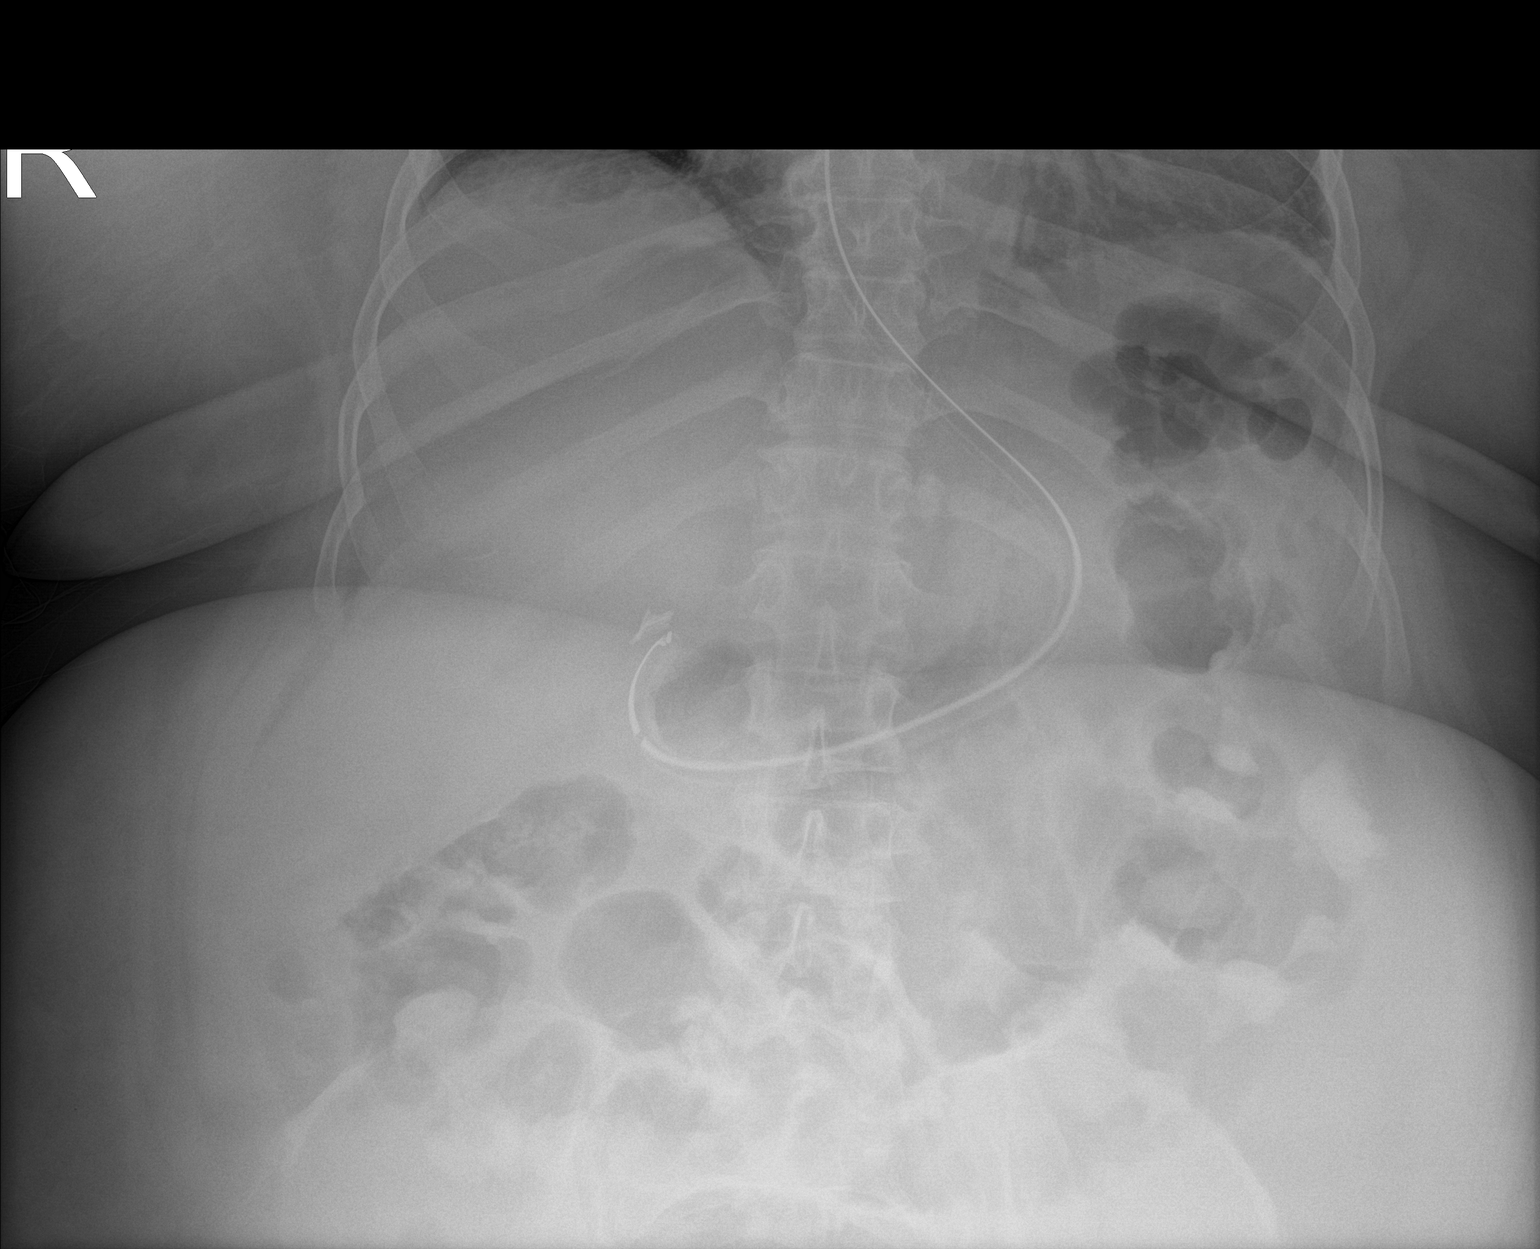

[3 of 3 positions shown; findings below may reference images not displayed]

FINDINGS: Left basilar atelectasis. Enteric tube tip and side-port project
over the stomach. Persistent gaseous distended loops of small bowel
within the central abdomen. There appears to be distal colonic gas.
Lumbar spine degenerative changes.
IMPRESSION: Findings compatible with small-bowel obstruction.

## 2022-08-06 ENCOUNTER — Emergency Department (HOSPITAL_BASED_OUTPATIENT_CLINIC_OR_DEPARTMENT_OTHER)
Admission: EM | Admit: 2022-08-06 | Discharge: 2022-08-06 | Disposition: A | Discharge status: Home / Self Care | Payer: BC Managed Care – PPO | Attending: Emergency Medicine | Admitting: Emergency Medicine

## 2022-08-06 ENCOUNTER — Encounter (HOSPITAL_BASED_OUTPATIENT_CLINIC_OR_DEPARTMENT_OTHER): Payer: Self-pay | Admitting: Pediatrics

## 2022-08-06 ENCOUNTER — Other Ambulatory Visit: Payer: Self-pay

## 2022-08-06 DIAGNOSIS — E119 Type 2 diabetes mellitus without complications: Secondary | ICD-10-CM | POA: Diagnosis not present

## 2022-08-06 DIAGNOSIS — Z7984 Long term (current) use of oral hypoglycemic drugs: Secondary | ICD-10-CM | POA: Diagnosis not present

## 2022-08-06 DIAGNOSIS — I10 Essential (primary) hypertension: Secondary | ICD-10-CM | POA: Diagnosis not present

## 2022-08-06 DIAGNOSIS — S6992XA Unspecified injury of left wrist, hand and finger(s), initial encounter: Secondary | ICD-10-CM | POA: Diagnosis present

## 2022-08-06 DIAGNOSIS — S61211A Laceration without foreign body of left index finger without damage to nail, initial encounter: Secondary | ICD-10-CM | POA: Insufficient documentation

## 2022-08-06 DIAGNOSIS — Z79899 Other long term (current) drug therapy: Secondary | ICD-10-CM | POA: Diagnosis not present

## 2022-08-06 DIAGNOSIS — W260XXA Contact with knife, initial encounter: Secondary | ICD-10-CM | POA: Diagnosis not present

## 2022-08-06 NOTE — Discharge Instructions (Addendum)
Please keep your wound clean.  Change dressings every day. Please do not hesitate to return to emergency department if worrisome signs symptoms we discussed become apparent.

## 2022-08-06 NOTE — ED Triage Notes (Signed)
Reported cut on left pointer finger with a knife while opening a freezer bag around 4:10 pm; not on blood thinners;

## 2022-08-06 NOTE — ED Provider Notes (Signed)
[facility] Provider Note   CSN: [id number] Arrival date & time: 08/06/22  1630     History  Chief Complaint  Patient presents with   Finger Injury    Mackenzie Weber is a 61 y.o. female with a past medical history of diabetes, hypertension presenting to the emergency department for evaluation of a laceration.  Patient reports cutting on her left index finger with a knife while opening a freezer pack around 4 PM.  She is not on any blood thinners.  She is up-to-date with her vaccinations.  Last tetanus shot was in May 2018.  Bleeding is controlled.  She is able to move her finger without difficulty.  Denies any fever, chest pain, shortness of breath, rash.  HPI     Home Medications Prior to Admission medications   Medication Sig Start Date End Date Taking? Authorizing Provider  albuterol (VENTOLIN HFA) 108 (90 Base) MCG/ACT inhaler INHALE 2 PUFFS BY MOUTH EVERY 4 HOURS AS NEEDED FOR WHEEZING FOR SHORTNESS OF BREATH 07/02/22   Valentina Shaggy, MD  azelastine (ASTELIN) 0.1 % nasal spray 1-2 sprays  each nostril twice daily as needed 10/18/20   Althea Charon, FNP  blood glucose meter kit and supplies KIT Check blood sugar once daily. One Touch Verio Reflect Meter 06/30/19   Nche, Charlene Brooke, NP  Budeson-Glycopyrrol-Formoterol (BREZTRI AEROSPHERE) 160-9-4.8 MCG/ACT AERO Inhale 2 puffs into the lungs daily. Use with spacer. Rinse, gargle and spit out after use. 05/22/22   Valentina Shaggy, MD  cyclobenzaprine (FLEXERIL) 5 MG tablet Take 1-2 tablets (5-10 mg total) by mouth at bedtime. 02/13/22   Nche, Charlene Brooke, NP  EPINEPHrine (AUVI-Q) 0.3 mg/0.3 mL IJ SOAJ injection Inject 0.3 mg into the muscle as needed for anaphylaxis. 10/18/20   Althea Charon, FNP  estradiol (ESTRACE) 0.1 MG/GM vaginal cream Place 1 Applicatorful vaginally once a week. 05/10/20   [provider]  famotidine (PEPCID) 20 MG tablet Take 1 tablet (20 mg total) by mouth  at bedtime. 09/22/21   Jackquline Denmark, MD  glucose blood (ONETOUCH VERIO) test strip USE 1 STRIP TO CHECK GLUCOSE ONCE DAILY 01/12/22   Nche, Charlene Brooke, NP  ipratropium (ATROVENT) 0.03 % nasal spray Place 2 sprays into both nostrils 3 (three) times daily. 09/19/21   Valentina Shaggy, MD  Lancets [facility] DELICA PLUS [id number]) MISC USE 1  TO CHECK GLUCOSE ONCE DAILY 01/12/22   Nche, Charlene Brooke, NP  losartan (COZAAR) 100 MG tablet Take 1 tablet (100 mg total) by mouth daily. 06/25/22   Nche, Charlene Brooke, NP  menthol-cetylpyridinium (CEPACOL) 3 MG lozenge Take 1 lozenge (3 mg total) by mouth as needed for sore throat. 11/08/21   Pokhrel, Corrie Mckusick, MD  metFORMIN (GLUCOPHAGE-XR) 500 MG 24 hr tablet TAKE 1 TABLET(500 MG) BY MOUTH DAILY WITH BREAKFAST Strength: 500 mg 11/17/21   Nche, Charlene Brooke, NP  montelukast (SINGULAIR) 10 MG tablet Take 1 tablet (10 mg total) by mouth at bedtime. FOR SEASONAL ALLERGY Patient taking differently: Take 10 mg by mouth daily as needed (seasonal allergies). 10/21/20   Althea Charon, FNP  Multiple Vitamin (MULTIVITAMIN) capsule Take 1 capsule by mouth daily.    [provider]  naproxen (NAPROSYN) 500 MG tablet Take 1 tablet (500 mg total) by mouth 2 (two) times daily with a meal. With food 02/13/22   Nche, Charlene Brooke, NP  NON FORMULARY 2 allergy injections every week    [provider]  Olopatadine HCl (PATADAY) 0.2 %  SOLN Place 1 drop into both eyes daily as needed. 10/02/21   Dara Hoyer, FNP  pantoprazole (PROTONIX) 40 MG tablet Take 1 tablet (40 mg total) by mouth 2 (two) times daily. 09/22/21   Jackquline Denmark, MD  traMADol (ULTRAM) 50 MG tablet Take 1 tablet (50 mg total) by mouth every 6 (six) hours as needed for moderate pain. 11/08/21   Pokhrel, Corrie Mckusick, MD  triamterene-hydrochlorothiazide (MAXZIDE) 75-50 MG tablet Take 0.5 tablets by mouth daily. 02/13/22   Nche, Charlene Brooke, NP      Allergies    Cortisone, Depo-medrol [methylprednisolone  sodium succ], and Aspirin    Review of Systems   Review of Systems  Skin:        Left index finger laceration.    Physical Exam Updated Vital Signs BP 126/77 (BP Location: Left Arm)   Pulse 73   Temp 98.4 F (36.9 C) (Oral)   Resp 18   Ht 5' 0.5" (1.537 m)   Wt 99.3 kg   SpO2 99%   BMI 42.07 kg/m  Physical Exam Vitals and nursing note reviewed.  Constitutional:      Appearance: Normal appearance.  HENT:     Head: Normocephalic and atraumatic.     Mouth/Throat:     Mouth: Mucous membranes are moist.  Eyes:     General: No scleral icterus. Cardiovascular:     Rate and Rhythm: Normal rate and regular rhythm.     Pulses: Normal pulses.     Heart sounds: Normal heart sounds.  Pulmonary:     Effort: Pulmonary effort is normal.     Breath sounds: Normal breath sounds.  Abdominal:     General: Abdomen is flat.     Palpations: Abdomen is soft.     Tenderness: There is no abdominal tenderness.  Musculoskeletal:        General: No deformity.  Skin:    General: Skin is warm.     Findings: No rash.     Comments: 0.5 cm superficial laceration on the distal L index finger. Bleeding is controled.  Neurological:     General: No focal deficit present.     Mental Status: She is alert.  Psychiatric:        Mood and Affect: Mood normal.     ED Results / Procedures / Treatments   Labs (all labs ordered are listed, but only abnormal results are displayed) Labs Reviewed - No data to display  EKG None  Radiology No results found.  Procedures Procedures    Medications Ordered in ED Medications - No data to display  ED Course/ Medical Decision Making/ A&P                           Medical Decision Making  This patient presents to the ED for concern of L index finger laceration, this involves an extensive number of treatment options, and is a complaint that carries with it a high risk of complications and morbidity.  The differential diagnosis includes laceration,  hematoma, other hand injury. Co morbidities that complicate the patient evaluation  See HPI Additional history obtained:  Additional history obtained from EMR External records from outside source obtained and reviewed including Care Everywhere/External Records and Primary Care Documents Lab Tests:  na Imaging Studies ordered:  na Cardiac Monitoring: / EKG:  The patient was maintained on a cardiac monitor.  I personally viewed and interpreted the cardiac monitored which showed an underlying rhythm  of: sinus rhythm Consultations Obtained:  na Problem List / ED Course / Critical interventions / Medication management  L distal index laceration. Vitals signs within normal range and stable throughout visit Laboratory/imaging studies significant for: See above On physical examination, patient is afebrile and appears in no acute distress. There was a 0.5 cm laceration on the L distal index finger. Bleeding is controled. Neurovascular intact. No evidence of foreign body. I applied dermabond and Alie, RN changed dressing. I advised patient to take tylenol/ibuprofen for pain. Patient's presentations are most concerned for laceration, superficial. Low suspicion for infection. I have reviewed the patients home medicines and have made adjustments as needed Continued outpatient therapy. Follow-up with PCP recommended for reevaluation of symptoms. Treatment plan discussed with patient.  Pt acknowledged understanding was agreeable to the plan. Social Determinants of Health:  N/A Test / Admission / Dispo - Considered:  Worrisome signs and symptoms were discussed with patient, and patient acknowledged understanding to return to the ED if they noticed these signs and symptoms. Patient was stable upon discharge.          Final Clinical Impression(s) / ED Diagnoses Final diagnoses:  None    Rx / DC Orders ED Discharge Orders     None         Rex Kras, [place] 08/07/22 1230    Dorie Rank, MD 08/09/22 1443

## 2022-08-06 NOTE — ED Notes (Signed)
Discharge instructions reviewed with patient. Patient verbalizes understanding, no further questions at this time. Medications and follow up information provided. No acute distress noted at time of departure.  

## 2022-08-16 ENCOUNTER — Ambulatory Visit (INDEPENDENT_AMBULATORY_CARE_PROVIDER_SITE_OTHER): Payer: BC Managed Care – PPO | Admitting: Nurse Practitioner

## 2022-08-16 ENCOUNTER — Encounter: Payer: Self-pay | Admitting: Nurse Practitioner

## 2022-08-16 VITALS — BP 120/78 | HR 75 | Temp 97.2°F | Ht <= 58 in | Wt 224.8 lb

## 2022-08-16 DIAGNOSIS — E1165 Type 2 diabetes mellitus with hyperglycemia: Secondary | ICD-10-CM

## 2022-08-16 DIAGNOSIS — I10 Essential (primary) hypertension: Secondary | ICD-10-CM | POA: Diagnosis not present

## 2022-08-16 LAB — RENAL FUNCTION PANEL
Albumin: 4.3 g/dL (ref 3.5–5.2)
BUN: 14 mg/dL (ref 6–23)
CO2: 26 mEq/L (ref 19–32)
Calcium: 9.7 mg/dL (ref 8.4–10.5)
Chloride: 104 mEq/L (ref 96–112)
Creatinine, Ser: 0.89 mg/dL (ref 0.40–1.20)
GFR: 70.43 mL/min (ref >60.00)
Glucose, Bld: 89 mg/dL (ref 70–99)
Phosphorus: 3.6 mg/dL (ref 2.3–4.6)
Potassium: 3.6 mEq/L (ref 3.5–5.1)
Sodium: 141 mEq/L (ref 135–145)

## 2022-08-16 LAB — HEPATIC FUNCTION PANEL
ALT: 11 U/L (ref 0–35)
AST: 15 U/L (ref 0–37)
Albumin: 4.3 g/dL (ref 3.5–5.2)
Alkaline Phosphatase: 57 U/L (ref 39–117)
Bilirubin, Direct: 0.1 mg/dL (ref 0.0–0.3)
Total Bilirubin: 0.4 mg/dL (ref 0.2–1.2)
Total Protein: 7.3 g/dL (ref 6.0–8.3)

## 2022-08-16 LAB — HEMOGLOBIN A1C: Hgb A1c MFr Bld: 7.1 % — ABNORMAL HIGH (ref 4.6–6.5)

## 2022-08-16 MED ORDER — OZEMPIC (0.25 OR 0.5 MG/DOSE) 2 MG/1.5ML ~~LOC~~ SOPN: PEN_INJECTOR | SUBCUTANEOUS | 2 refills | Status: DC

## 2022-08-16 NOTE — Assessment & Plan Note (Addendum)
No glucose check at home Continues to struggle with diet and limited exercise due to back and hip pain (ongoing PT) Current use of metformin with no adverse side effects. We discussed use of GLP and possible side effects. She agreed to start ozempic injection No retinopathy, resolved constipation.  Check hgbA1c, cmp Sent ozempic 0.'25mg'$  to 0.'5mg'$  F/up in 47month

## 2022-08-16 NOTE — Patient Instructions (Addendum)
Use miconazole powder/ Zeasorb powder.  Go to lab Start ozempic injection

## 2022-08-16 NOTE — Assessment & Plan Note (Signed)
BP at goal with maxzide BP Readings from Last 3 Encounters:  08/16/22 120/78  08/06/22 133/88  05/22/22 (!) 100/58    Maintain med dose Repeat BMP

## 2022-08-16 NOTE — Progress Notes (Signed)
Established Patient Visit  Patient: Mackenzie Weber   DOB: [birth year]   61 y.o. Female  MRN: [id number] Visit Date: 08/16/2022  Subjective:    Chief Complaint  Patient presents with   Office Visit    HTN/ DM/ Hyperlipidemia  Pt fasting  Doesn't check BP but checks BS daily  C/o dryness on right foot  Requesting records for pap   HPI Hypertensive disorder BP at goal with maxzide BP Readings from Last 3 Encounters:  08/16/22 120/78  08/06/22 133/88  05/22/22 (!) 100/58    Maintain med dose Repeat BMP  Type 2 diabetes mellitus with hyperglycemia, without long-term current use of insulin (HCC) No glucose check at home Continues to struggle with diet and limited exercise due to back and hip pain (ongoing PT) Current use of metformin with no adverse side effects. We discussed use of GLP and possible side effects. She agreed to start ozempic injection No retinopathy, resolved constipation.  Check hgbA1c, cmp and lipid panel  Wt Readings from Last 3 Encounters:  08/16/22 224 lb 12.8 oz (102 kg)  08/06/22 219 lb (99.3 kg)  05/22/22 222 lb (100.7 kg)    BP Readings from Last 3 Encounters:  08/16/22 120/78  08/06/22 133/88  05/22/22 (!) 100/58    Reviewed medical, surgical, and social history today  Medications: Outpatient Medications Prior to Visit  Medication Sig   albuterol (VENTOLIN HFA) 108 (90 Base) MCG/ACT inhaler INHALE 2 PUFFS BY MOUTH EVERY 4 HOURS AS NEEDED FOR WHEEZING FOR SHORTNESS OF BREATH   azelastine (ASTELIN) 0.1 % nasal spray 1-2 sprays  each nostril twice daily as needed   blood glucose meter kit and supplies KIT Check blood sugar once daily. One Touch Verio Reflect Meter   Budeson-Glycopyrrol-Formoterol (BREZTRI AEROSPHERE) 160-9-4.8 MCG/ACT AERO Inhale 2 puffs into the lungs daily. Use with spacer. Rinse, gargle and spit out after use.   EPINEPHrine (AUVI-Q) 0.3 mg/0.3 mL IJ SOAJ injection Inject 0.3 mg into the muscle as needed for  anaphylaxis.   estradiol (ESTRACE) 0.1 MG/GM vaginal cream Place 1 Applicatorful vaginally once a week.   famotidine (PEPCID) 20 MG tablet Take 1 tablet (20 mg total) by mouth at bedtime.   glucose blood (ONETOUCH VERIO) test strip USE 1 STRIP TO CHECK GLUCOSE ONCE DAILY   ipratropium (ATROVENT) 0.03 % nasal spray Place 2 sprays into both nostrils 3 (three) times daily.   Lancets (ONETOUCH DELICA PLUS [id number]) MISC USE 1  TO CHECK GLUCOSE ONCE DAILY   losartan (COZAAR) 100 MG tablet Take 1 tablet (100 mg total) by mouth daily.   metFORMIN (GLUCOPHAGE-XR) 500 MG 24 hr tablet TAKE 1 TABLET(500 MG) BY MOUTH DAILY WITH BREAKFAST Strength: 500 mg   montelukast (SINGULAIR) 10 MG tablet Take 1 tablet (10 mg total) by mouth at bedtime. FOR SEASONAL ALLERGY (Patient taking differently: Take 10 mg by mouth daily as needed (seasonal allergies).)   Multiple Vitamin (MULTIVITAMIN) capsule Take 1 capsule by mouth daily.   Olopatadine HCl (PATADAY) 0.2 % SOLN Place 1 drop into both eyes daily as needed.   pantoprazole (PROTONIX) 40 MG tablet Take 1 tablet (40 mg total) by mouth 2 (two) times daily.   triamterene-hydrochlorothiazide (MAXZIDE) 75-50 MG tablet Take 0.5 tablets by mouth daily.   cyclobenzaprine (FLEXERIL) 5 MG tablet Take 1-2 tablets (5-10 mg total) by mouth at bedtime. (Patient not taking: Reported on 08/16/2022)   menthol-cetylpyridinium (CEPACOL) 3 MG  lozenge Take 1 lozenge (3 mg total) by mouth as needed for sore throat. (Patient not taking: Reported on 08/16/2022)   naproxen (NAPROSYN) 500 MG tablet Take 1 tablet (500 mg total) by mouth 2 (two) times daily with a meal. With food (Patient not taking: Reported on 08/16/2022)   NON FORMULARY 2 allergy injections every week (Patient not taking: Reported on 08/16/2022)   traMADol (ULTRAM) 50 MG tablet Take 1 tablet (50 mg total) by mouth every 6 (six) hours as needed for moderate pain. (Patient not taking: Reported on 08/16/2022)   No  facility-administered medications prior to visit.   Reviewed past medical and social history.   ROS per HPI above      Objective:  BP 120/78   Pulse 75   Temp (!) 97.2 F (36.2 C) (Temporal)   Ht _0  (1.473 m)   Wt 224 lb 12.8 oz (102 kg)   SpO2 96%   BMI 46.98 kg/m      Physical Exam Vitals reviewed.  Cardiovascular:     Rate and Rhythm: Normal rate and regular rhythm.     Pulses: Normal pulses.     Heart sounds: Normal heart sounds.  Pulmonary:     Effort: Pulmonary effort is normal.     Breath sounds: Normal breath sounds.  Abdominal:     General: There is no distension.     Palpations: Abdomen is soft.  Neurological:     Mental Status: She is alert and oriented to person, place, and time.     Results for orders placed or performed in visit on 08/16/22  HM DIABETES EYE EXAM  Result Value Ref Range   HM Diabetic Eye Exam No Retinopathy No Retinopathy      Assessment & Plan:    Problem List Items Addressed This Visit       Cardiovascular and Mediastinum   Hypertensive disorder    BP at goal with maxzide BP Readings from Last 3 Encounters:  08/16/22 120/78  08/06/22 133/88  05/22/22 (!) 100/58    Maintain med dose Repeat BMP      Relevant Orders   Renal Function Panel     Endocrine   Type 2 diabetes mellitus with hyperglycemia, without long-term current use of insulin (HCC) - Primary    No glucose check at home Continues to struggle with diet and limited exercise due to back and hip pain (ongoing PT) Current use of metformin with no adverse side effects. We discussed use of GLP and possible side effects. She agreed to start ozempic injection No retinopathy, resolved constipation.  Check hgbA1c, cmp and lipid panel       Relevant Medications   Semaglutide,0.25 or 0.5MG/DOS, (OZEMPIC, 0.25 OR 0.5 MG/DOSE,) 2 MG/1.5ML SOPN   Other Relevant Orders   Hemoglobin A1c   Renal Function Panel   Hepatic function panel   Return in about 3  months (around 11/16/2022) for DM, HTN, hyperlipidemia (fasting).     Wilfred Lacy, NP

## 2022-08-23 ENCOUNTER — Other Ambulatory Visit: Payer: Self-pay

## 2022-08-23 DIAGNOSIS — E1165 Type 2 diabetes mellitus with hyperglycemia: Secondary | ICD-10-CM

## 2022-08-23 MED ORDER — OZEMPIC (0.25 OR 0.5 MG/DOSE) 2 MG/3ML ~~LOC~~ SOPN: PEN_INJECTOR | SUBCUTANEOUS | 0 refills | Status: DC

## 2022-08-29 ENCOUNTER — Other Ambulatory Visit: Payer: Self-pay | Admitting: Nurse Practitioner

## 2022-08-29 DIAGNOSIS — E1165 Type 2 diabetes mellitus with hyperglycemia: Secondary | ICD-10-CM

## 2022-08-29 NOTE — Telephone Encounter (Signed)
Chart supports Rx Last OV: 08/2022 Next OV: 11/2022

## 2022-10-12 ENCOUNTER — Encounter: Payer: Self-pay | Admitting: Internal Medicine

## 2022-10-12 ENCOUNTER — Ambulatory Visit (INDEPENDENT_AMBULATORY_CARE_PROVIDER_SITE_OTHER): Payer: BC Managed Care – PPO | Admitting: Internal Medicine

## 2022-10-12 ENCOUNTER — Other Ambulatory Visit: Payer: Self-pay

## 2022-10-12 VITALS — BP 122/72 | HR 97 | Temp 97.9°F | Resp 22 | Wt 222.0 lb

## 2022-10-12 DIAGNOSIS — J3089 Other allergic rhinitis: Secondary | ICD-10-CM | POA: Diagnosis not present

## 2022-10-12 DIAGNOSIS — K219 Gastro-esophageal reflux disease without esophagitis: Secondary | ICD-10-CM

## 2022-10-12 DIAGNOSIS — J4541 Moderate persistent asthma with (acute) exacerbation: Secondary | ICD-10-CM

## 2022-10-12 DIAGNOSIS — J302 Other seasonal allergic rhinitis: Secondary | ICD-10-CM

## 2022-10-12 MED ORDER — AZITHROMYCIN 250 MG PO TABS: ORAL_TABLET | ORAL | 0 refills | Status: DC

## 2022-10-12 MED ORDER — METHYLPREDNISOLONE ACETATE 40 MG/ML IJ SUSP
40.0000 mg | Freq: Once | INTRAMUSCULAR | Status: AC
  Administered 2022-10-12: 40 mg via INTRAMUSCULAR

## 2022-10-12 MED ORDER — PREDNISONE 10 MG PO TABS: ORAL_TABLET | ORAL | 0 refills | Status: DC

## 2022-10-12 MED ORDER — AIRSUPRA 90-80 MCG/ACT IN AERO: 2.0000 | INHALATION_SPRAY | RESPIRATORY_TRACT | 3 refills | Status: AC | PRN

## 2022-10-12 NOTE — Patient Instructions (Addendum)
1. Moderate persistent asthma with exacerbation:   -Rapid covid test today is negative. Consider flu testing. -40 mg IM depomedrol in clinic -Tomorrow start prednisone 20 mg daily for 3 days then 10 mg daily on day 4 -Continue Breztri 2 puffs twice daily for the next 2 weeks or until symptoms resolve -Use Airsupra 2-4 puffs every 4 to 6 hours as needed, may use scheduled while awake for the next 2-3 days -start nasal saline rinses and use twice daily - continue mucinex DM [phone] mg 1-2 times daily; take with 1-2 glasses of water  Will call in azithromycin-if no improvement in symptoms by Monday January 1st, please start this medication-2 tablets on day 1, then 1 tablet daily for the following 4 days  Once controlled- - Daily controller medication(s): Breztri two puffs ONCE daily with spacer (prescribed for TWICE daily) - Prior to physical activity:  Airsupra 2 puffs  10-15 minutes before physical activity. - Rescue medications:  Airsupra 4 puffs every 4 to 6 hours as needed-this will replace albuterol - Changes during respiratory infections or worsening symptoms: Increase Breztri two puffs to 2 puffs twice daily for TWO WEEKS. - Asthma control goals:  * Full participation in all desired activities (may need albuterol before activity) * Albuterol use two time or less a week on average (not counting use with activity) * Cough interfering with sleep two time or less a month * Oral steroids no more than once a year * No hospitalizations  2. Perennial and seasonal allergic rhinitis - Continue with the nasal saline lavage.  3. Gastroesophageal reflux disease - Continue with Protonix and Pepcid at least once daily.  - Get back on the bicycle and stop eating late at night.    4.Follow-up with Dr. Ernst Bowler as scheduled   Please inform us of any Emergency Department visits, hospitalizations, or changes in symptoms. Call us before going to the ED for breathing or allergy symptoms since we  might be able to fit you in for a sick visit. Feel free to contact us anytime with any questions, problems, or concerns.  It was a pleasure to meet you today!  Websites that have reliable patient information: 1. American Academy of Asthma, Allergy, and Immunology: www.aaaai.org 2. Food Allergy Research and Education (FARE): foodallergy.org 3. Mothers of Asthmatics: http://www.asthmacommunitynetwork.org 4. American College of Allergy, Asthma, and Immunology: www.acaai.org   COVID-19 Vaccine Information can be found at: [email or web address].uk For questions related to vaccine distribution or appointments, please email [email or web address] or call [phone].

## 2022-10-12 NOTE — Progress Notes (Signed)
FOLLOW UP Date of Service/Encounter:  10/12/22   Subjective:  Mackenzie Weber (DOB: 1961-09-06) is a 61 y.o. female who returns to the [facility] on 10/12/2022 in re-evaluation of the following: moderate persistent asthma, allergic rhinitis on AIT, GERD History obtained from: chart review and patient.  For Review, LV was on 05/22/22  with Dr. Ernst Bowler seen for routine follow-up.she was doing excellent and was able to successfully complete AIT.  Had weaned down on most of her allergy medications.  Today presents for follow-up. She is having cough and congestion which started yesterday. 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 She is taking her Breztri 2 puffs BID as of yesterday, prior to that was only using Breztri 2 puffs daily. Yesterday started coughing up yellow thick mucus. Is taking Mucinex DM. When she coughs, it takes her breath her away. She did use her rescue inhaler last night which was helpful.  Her husband, and son in law have had a similar cough.  She denies fever or other sick symptoms. She did take prednisone yesterday that she had left over prescribed for joint pain. This seems to be helping too.  Allergies as of 10/12/2022       Reactions   Cortisone    Red area around injection site x 28mh   Depo-medrol [methylprednisolone Sodium Succ] Nausea Only, Other (See Comments)   Dizziness   Aspirin Anxiety, Other (See Comments)   Rapid Heart beat        Medication List        Accurate as of October 12, 2022  4:29 PM. If you have any questions, ask your nurse or doctor.          Airsupra 90-80 MCG/ACT Aero Generic drug: Albuterol-Budesonide Inhale 2 puffs into the lungs as needed (maximum 12 puffs/day). Started by: EClemon Chambers MD   albuterol 108 (90 Base) MCG/ACT inhaler Commonly known as: VENTOLIN HFA INHALE 2 PUFFS BY MOUTH EVERY 4 HOURS AS NEEDED FOR WHEEZING FOR SHORTNESS OF BREATH    azelastine 0.1 % nasal spray Commonly known as: ASTELIN 1-2 sprays  each nostril twice daily as needed   azithromycin 250 MG tablet Commonly known as: Zithromax Z-Pak If no improvement by Monday January 1st: Take 2 tablets on day 1 then one tablet daily for the following 4 days. Started by: EClemon Chambers MD   blood glucose meter kit and supplies Kit Check blood sugar once daily. One Touch Verio Reflect Meter   Breztri Aerosphere 160-9-4.8 MCG/ACT Aero Generic drug: Budeson-Glycopyrrol-Formoterol Inhale 2 puffs into the lungs daily. Use with spacer. Rinse, gargle and spit out after use.   cyclobenzaprine 5 MG tablet Commonly known as: FLEXERIL Take 1-2 tablets (5-10 mg total) by mouth at bedtime.   EPINEPHrine 0.3 mg/0.3 mL Soaj injection Commonly known as: Auvi-Q Inject 0.3 mg into the muscle as needed for anaphylaxis.   estradiol 0.1 MG/GM vaginal cream Commonly known as: ESTRACE Place 1 Applicatorful vaginally once a week.   famotidine 20 MG tablet Commonly known as: Pepcid Take 1 tablet (20 mg total) by mouth at bedtime.   ipratropium 0.03 % nasal spray Commonly known as: ATROVENT Place 2 sprays into both nostrils 3 (three) times daily.   losartan 100 MG tablet Commonly known as: COZAAR Take 1 tablet (100 mg total) by mouth daily.   menthol-cetylpyridinium 3 MG lozenge Commonly known as: CEPACOL Take 1 lozenge (3 mg total) by  mouth as needed for sore throat.   metFORMIN 500 MG 24 hr tablet Commonly known as: GLUCOPHAGE-XR Take 1 tablet by mouth once daily with breakfast   montelukast 10 MG tablet Commonly known as: SINGULAIR Take 1 tablet (10 mg total) by mouth at bedtime. FOR SEASONAL ALLERGY What changed:  when to take this reasons to take this additional instructions   multivitamin capsule Take 1 capsule by mouth daily.   naproxen 500 MG tablet Commonly known as: Naprosyn Take 1 tablet (500 mg total) by mouth 2 (two) times daily with a meal. With  food   NON FORMULARY 2 allergy injections every week   Olopatadine HCl 0.2 % Soln Commonly known as: Pataday Place 1 drop into both eyes daily as needed.   OneTouch Delica Plus [id number] Misc USE 1  TO CHECK GLUCOSE ONCE DAILY FOR BLOOD SUGAR   OneTouch Verio test strip Generic drug: glucose blood USE 1 STRIP TO CHECK GLUCOSE ONCE DAILY   Ozempic (0.25 or 0.5 MG/DOSE) 2 MG/1.5ML Sopn Generic drug: Semaglutide(0.25 or 0.5MG/DOS) 0.25ng weekly x 2weeks then 0.9m weekly continuously   Ozempic (0.25 or 0.5 MG/DOSE) 2 MG/3ML Sopn Generic drug: Semaglutide(0.25 or 0.5MG/DOS) Inject 0.25 MG into the skin once weekly for 14 days, then inject 0.5 MG into the sin once a week continuously.   pantoprazole 40 MG tablet Commonly known as: PROTONIX Take 1 tablet (40 mg total) by mouth 2 (two) times daily.   predniSONE 10 MG tablet Commonly known as: DELTASONE Take 2 tablets by mouth daily starting 10/13/22 and continue for 3 days, then take 1 tablet by mouth on the 4th day then stop. Start taking on: October 13, 2022 Started by: EClemon Chambers MD   traMADol 50 MG tablet Commonly known as: ULTRAM Take 1 tablet (50 mg total) by mouth every 6 (six) hours as needed for moderate pain.   triamterene-hydrochlorothiazide 75-50 MG tablet Commonly known as: Maxzide Take 0.5 tablets by mouth daily.       Past Medical History:  Diagnosis Date   Anemia    Asthma    Diabetes mellitus without complication (HCC)    Foot pain, right    Hypertension    IBS (irritable bowel syndrome)    Sciatica of left side    Seasonal allergies    Past Surgical History:  Procedure Laterality Date   ABDOMINAL HYSTERECTOMY     APPENDECTOMY     BLADDER SUSPENSION  2012   TVT   BREAST SURGERY  2001   /biopsy-benign   CHOLECYSTECTOMY     COLONOSCOPY  06/24/2019   [facility]   ESOPHAGOGASTRODUODENOSCOPY  05/12/2018   [facility]    EXCISIONAL HEMORRHOIDECTOMY     KNEE SURGERY     right    SHOULDER SURGERY Left 02/13/2018   Lipoma removed    Small Bowel Surgery     TONSILLECTOMY     TOTAL ABDOMINAL HYSTERECTOMY W/ BILATERAL SALPINGOOPHORECTOMY  1989   LSO-1987; RsO-1998   WRIST SURGERY     carpal tunnel repair   Otherwise, there have been no changes to her past medical history, surgical history, family history, or social history.  ROS: All others negative except as noted per HPI.   Objective:  BP 122/72 (BP Location: Left Arm, Patient Position: Sitting, Cuff Size: Large)   Pulse 97   Temp 97.9 F (36.6 C) (Temporal)   Resp (!) 22   Wt 222 lb (100.7 kg)   SpO2 98%   BMI  46.40 kg/m  Body mass index is 46.4 kg/m. Physical Exam: General Appearance:  Alert, cooperative, no distress, appears stated age  Head:  Normocephalic, without obvious abnormality, atraumatic  Eyes:  Conjunctiva clear, EOM's intact  Nose: Nares normal, hypertrophic turbinates and normal mucosa  Throat: Lips, tongue normal; teeth and gums normal, normal posterior oropharynx  Neck: Supple, symmetrical  Lungs:   end-expiratory wheezing, Respirations unlabored, no coughing  Heart:  regular rate and rhythm and no murmur, Appears well perfused  Extremities: No edema  Skin: Skin color, texture, turgor normal, no rashes or lesions on visualized portions of skin  Neurologic: No gross deficits   Spirometry:  Tracings reviewed. Her effort:  deferred due to coughing   Assessment/Plan   1. Moderate persistent asthma with exacerbation:   -Rapid covid test today is negative. Consider flu testing. -40 mg IM depomedrol in clinic -Tomorrow start prednisone 20 mg daily for 3 days then 10 mg daily on day 4 -Continue Breztri 2 puffs twice daily for the next 2 weeks or until symptoms resolve -Use Airsupra 2-4 puffs every 4 to 6 hours as needed, may use scheduled while awake for the next 2-3 days -start nasal saline rinses and use twice daily - continue mucinex DM [phone] mg 1-2 times daily; take with 1-2  glasses of water  Will call in azithromycin-if no improvement in symptoms by Monday January 1st, please start this medication-2 tablets on day 1, then 1 tablet daily for the following 4 days  Once controlled- - Daily controller medication(s): Breztri two puffs ONCE daily with spacer (prescribed for TWICE daily) - Prior to physical activity: Airsupra 2 puffs 10-15 minutes before physical activity. - Rescue medications: Airsupra 4 puffs every 4 to 6 hours as needed-this will replace albuterol - Changes during respiratory infections or worsening symptoms: Increase Breztri two puffs to 2 puffs twice daily for TWO WEEKS. - Asthma control goals:  * Full participation in all desired activities (may need albuterol before activity) * Albuterol use two time or less a week on average (not counting use with activity) * Cough interfering with sleep two time or less a month * Oral steroids no more than once a year * No hospitalizations  2. Perennial and seasonal allergic rhinitis - Continue with the nasal saline lavage.  3. Gastroesophageal reflux disease - Continue with Protonix and Pepcid at least once daily.  - Get back on the bicycle and stop eating late at night.    4.Follow-up with Dr. Ernst Bowler as scheduled  Sigurd Sos, MD  [facility]

## 2022-10-30 ENCOUNTER — Encounter: Payer: Self-pay | Admitting: Nurse Practitioner

## 2022-10-30 NOTE — Telephone Encounter (Signed)
Caller Name: [place] Call back phone #: [phone]  Reason for Call: pt called stating ozempic is needing prior auth to refill. Pt confirms [place] primary & [employer] secondary.   [place] [place]

## 2022-10-31 ENCOUNTER — Other Ambulatory Visit: Payer: Self-pay | Admitting: Nurse Practitioner

## 2022-10-31 DIAGNOSIS — I10 Essential (primary) hypertension: Secondary | ICD-10-CM

## 2022-10-31 NOTE — Telephone Encounter (Signed)
Chart supports Rx Last OV: 08/2022 Next OV: 11/2022

## 2022-11-06 ENCOUNTER — Telehealth: Payer: Self-pay | Admitting: Nurse Practitioner

## 2022-11-06 NOTE — Telephone Encounter (Signed)
Caller Name: [place] Call back phone #: [phone]   MEDICATION(S):  Ozempic  Days of Med Remaining: 0  Has the patient contacted their pharmacy (YES/NO)? yes What did pharmacy advise? To call for PA  Preferred Pharmacy:  [facility]  ~~~Please advise patient/caregiver to allow 2-3 business days to process RX refills.

## 2022-11-08 ENCOUNTER — Other Ambulatory Visit (HOSPITAL_COMMUNITY): Payer: Self-pay

## 2022-11-09 ENCOUNTER — Other Ambulatory Visit (HOSPITAL_COMMUNITY): Payer: Self-pay

## 2022-11-09 NOTE — Telephone Encounter (Signed)
Ran test claim, received paid claim. Called pharmacy, prescription is out of refills.

## 2022-11-13 ENCOUNTER — Ambulatory Visit: Payer: BC Managed Care – PPO | Admitting: Family Medicine

## 2022-11-13 ENCOUNTER — Encounter: Payer: Self-pay | Admitting: Allergy & Immunology

## 2022-11-13 ENCOUNTER — Other Ambulatory Visit: Payer: Self-pay | Admitting: Nurse Practitioner

## 2022-11-13 ENCOUNTER — Ambulatory Visit (INDEPENDENT_AMBULATORY_CARE_PROVIDER_SITE_OTHER): Payer: BC Managed Care – PPO | Admitting: Allergy & Immunology

## 2022-11-13 ENCOUNTER — Other Ambulatory Visit: Payer: Self-pay

## 2022-11-13 VITALS — BP 130/86 | HR 69 | Temp 97.7°F | Ht <= 58 in | Wt 218.8 lb

## 2022-11-13 DIAGNOSIS — J4540 Moderate persistent asthma, uncomplicated: Secondary | ICD-10-CM | POA: Diagnosis not present

## 2022-11-13 DIAGNOSIS — J0110 Acute frontal sinusitis, unspecified: Secondary | ICD-10-CM

## 2022-11-13 DIAGNOSIS — K219 Gastro-esophageal reflux disease without esophagitis: Secondary | ICD-10-CM | POA: Diagnosis not present

## 2022-11-13 DIAGNOSIS — J302 Other seasonal allergic rhinitis: Secondary | ICD-10-CM

## 2022-11-13 DIAGNOSIS — E1165 Type 2 diabetes mellitus with hyperglycemia: Secondary | ICD-10-CM

## 2022-11-13 DIAGNOSIS — J3089 Other allergic rhinitis: Secondary | ICD-10-CM

## 2022-11-13 MED ORDER — BREZTRI AEROSPHERE 160-9-4.8 MCG/ACT IN AERO: 2.0000 | INHALATION_SPRAY | Freq: Every day | RESPIRATORY_TRACT | 5 refills | Status: DC

## 2022-11-13 MED ORDER — PANTOPRAZOLE SODIUM 40 MG PO TBEC: 40.0000 mg | DELAYED_RELEASE_TABLET | Freq: Two times a day (BID) | ORAL | 5 refills | Status: DC

## 2022-11-13 MED ORDER — AMOXICILLIN-POT CLAVULANATE 875-125 MG PO TABS: 1.0000 | ORAL_TABLET | Freq: Two times a day (BID) | ORAL | 0 refills | Status: DC

## 2022-11-13 MED ORDER — FAMOTIDINE 20 MG PO TABS: 20.0000 mg | ORAL_TABLET | Freq: Every day | ORAL | 5 refills | Status: DC

## 2022-11-13 NOTE — Patient Instructions (Addendum)
1. Moderate persistent asthma without complication - Lung testing not done today. - COVID testing is negative once again.  - Daily controller medication(s): Breztri two puffs ONCE daily with spacer (prescribed for TWICE daily) - Prior to physical activity: AiurSupra two puffs 10-15 minutes before physical activity. - Rescue medications: AirSupra 2-4 puffs every 4-6 hours as needed - Changes during respiratory infections or worsening symptoms: Increase Breztri two puffs to 2 puffs twice daily for TWO WEEKS. - Asthma control goals:  * Full participation in all desired activities (may need albuterol before activity) * Albuterol use two time or less a week on average (not counting use with activity) * Cough interfering with sleep two time or less a month * Oral steroids no more than once a year * No hospitalizations  2. Perennial and seasonal allergic rhinitis - Continue with the nasal saline lavage. - You seem to have everything under excellent control. - Start Augmentin '875mg'$  twice daily for 7 days.  - hopefully this will clear up the sinuses.   3. Gastroesophageal reflux disease - Continue with Protonix and Pepcid at least once daily.   4. Return in about 3 months (around 02/12/2023).    Please inform us of any Emergency Department visits, hospitalizations, or changes in symptoms. Call us before going to the ED for breathing or allergy symptoms since we might be able to fit you in for a sick visit. Feel free to contact us anytime with any questions, problems, or concerns.  It was a pleasure to see you today!  Websites that have reliable patient information: 1. American Academy of Asthma, Allergy, and Immunology: www.aaaai.org 2. Food Allergy Research and Education (FARE): foodallergy.org 3. Mothers of Asthmatics: http://www.asthmacommunitynetwork.org 4. American College of Allergy, Asthma, and Immunology: www.acaai.org   COVID-19 Vaccine Information can be found at:  [email or web address].[email or web address].uk For questions related to vaccine distribution or appointments, please email [email or web address] or call [phone].   We realize that you might be concerned about having an allergic reaction to the COVID19 vaccines. To help with that concern, WE ARE OFFERING THE COVID19 VACCINES IN OUR OFFICE! Ask the front desk for dates!     "Like" [place] on Facebook and Instagram for our latest updates!      A healthy democracy works best when [employer] participate! Make sure you are registered to vote! If you have moved or changed any of your contact information, you will need to get this updated before voting!  In some cases, you MAY be able to register to vote online: [email or web address]

## 2022-11-13 NOTE — Progress Notes (Signed)
FOLLOW UP  Date of Service/Encounter:  11/13/22   Assessment:   Moderate persistent asthma, uncomplicated - with improved control since using her medications as directed   Perennial and seasonal allergic rhinitis - previously on allergen immunotherapy but now stopping   GERD - on Nexium and Pepcid (followed by Dr. 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 Moderate persistent asthma without complication - Lung testing not done today. - COVID testing is negative once again.  - Daily controller medication(s): Breztri two puffs ONCE daily with spacer (prescribed for TWICE daily) - Prior to physical activity: AiurSupra two puffs 10-15 minutes before physical activity. - Rescue medications: AirSupra 2-4 puffs every 4-6 hours as needed - Changes during respiratory infections or worsening symptoms: Increase Breztri two puffs to 2 puffs twice daily for TWO WEEKS. - Asthma control goals:  * Full participation in all desired activities (may need albuterol before activity) * Albuterol use two time or less a week on average (not counting use with activity) * Cough interfering with sleep two time or less a month * Oral steroids no more than once a year * No hospitalizations  2. Perennial and seasonal allergic rhinitis - Continue with the nasal saline lavage. - You seem to have everything under excellent control. - Start Augmentin '875mg'$  twice daily for 7 days.  - hopefully this will clear up the sinuses.   3. Gastroesophageal reflux disease - Continue with Protonix and Pepcid at least once daily.   4. Return in about 3 months (around 02/12/2023).    Subjective:   Mackenzie Weber is a 62 y.o. female presenting today for follow up of  Chief Complaint  Patient presents with   Nasal Congestion    Pt c/o sneezing all symptoms x 1 week    Cough   Headache    Mackenzie Weber has a history of the following: Patient Active Problem List    Diagnosis Date Noted   GAD (generalized anxiety disorder) 12/15/2021   Allergic conjunctivitis of both eyes 11/16/2020   Allergic conjunctivitis 05/30/2020   Moderate persistent asthma 05/30/2020   Grade I diastolic dysfunction 18/29/9371   Bilateral foot pain 01/13/2020   Extensor tendonitis of foot 01/13/2020   BMI 40.0-44.9, adult ([place]) 12/23/2019   Type 2 diabetes mellitus with hyperglycemia, without long-term current use of insulin ([place]) 01/01/2019   Seasonal and perennial allergic rhinitis 05/20/2018   Chronic bilateral low back pain with left-sided sciatica 05/20/2018   Lipoma of left upper extremity 01/29/2018   Muscle spasm of left lower extremity 01/29/2018   Intestinal ulcer 04/23/2017   Bilateral lower extremity edema 04/23/2017   Gastroesophageal reflux disease 03/28/2016   History of colon polyps 03/28/2016   Anemia 03/28/2016   Somatic dysfunction of cervical region 11/16/2015   Chronic sciatica 01/28/2015   Somatic dysfunction of pelvic region 01/28/2015   Chronic pelvic pain in female 01/28/2015   Somatic dysfunction of lower extremity 01/28/2015   Somatic dysfunction of sacral region 01/28/2015   Hypertensive disorder 10/22/2014   Morbid obesity due to excess calories ([facility]) 10/22/2014   Abdominal muscle strain 06/07/2014   Sciatica of left side 11/03/2012    History obtained from: chart review and patient.  [name] is a 62 y.o. female presenting for a sick visit.  Patient was last seen in December 2023 by Dr. Simona Huh.  At that time, she was continued on Breztri 2 puffs once daily, increasing to twice daily  during flares.  She was continued on her supra 2 puffs 10 to 15 minutes before physical activity.  For her rhinitis, we continue with nasal saline lavage as needed.  We also continue with Protonix and Pepcid.  She had a rapid COVID test that was negative.  They gave her IM Depo-Medrol and started on prednisone.   Since the last visit, she really has not changed  much. She has been coughing with headaches and hoarseness. There is not much of wheezing. She is breathing ok, but this is mostly in her sinuses. She took the prednisone and she took the azithromycin as well. She did not take the prednisone as prescribed because she tends to get stomach issues. She is currently on Protonix as well as Pepcid. She takes Pepcid on a PRN basis and Protonix more routinely. She denies any current GERD symptoms at all. She did feel good for 2-3 weeks before her symptoms worsened again. The weather changes seem to make things worse.  She does feels that her symptoms are mostly related to sinus pain and pressure.  She has not had any problems with her posturing.  She has been able to make it through a church service without becoming too hoarse.  Asthma/Respiratory Symptom History: She remains on the Breztri two puffs twice daily. She is mostly using it once per week.  She is not sure why she does not use it more often.  It does not cost her too much, so this is not the issue.  She does have the air supra, which she is using every couple of days.  She has not been to the ED for symptoms.  She does feel like the prednisone did help with her symptoms.  Allergic Rhinitis Symptom History: She is not using an antihistamine on a routine basis.  She does have Mucinex that she is using fairly consistently.  She does not feel any worse since stopping her allergy shots.  Otherwise, there have been no changes to her past medical history, surgical history, family history, or social history.    Review of Systems  Constitutional: Negative.  Negative for fever, malaise/fatigue and weight loss.  HENT:  Positive for congestion, sinus pain and sore throat. Negative for ear discharge and ear pain.   Eyes:  Negative for pain, discharge and redness.  Respiratory:  Negative for cough, sputum production, shortness of breath and wheezing.   Cardiovascular: Negative.  Negative for chest pain and  palpitations.  Gastrointestinal:  Negative for abdominal pain, constipation, diarrhea, heartburn, nausea and vomiting.  Skin: Negative.  Negative for itching and rash.  Neurological:  Negative for dizziness and headaches.  Endo/Heme/Allergies:  Negative for environmental allergies. Does not bruise/bleed easily.  All other systems reviewed and are negative.      Objective:   Blood pressure 130/86, pulse 69, temperature 97.7 F (36.5 C), height '4\' 10"'$  (1.473 m), weight 218 lb 12.8 oz (99.2 kg), SpO2 98 %. Body mass index is 45.73 kg/m.    Physical Exam Vitals reviewed.  Constitutional:      Appearance: She is well-developed.     Comments: Boisterous. Delightful as always.  HENT:     Head: Normocephalic and atraumatic.     Right Ear: Tympanic membrane, ear canal and external ear normal.     Left Ear: Tympanic membrane, ear canal and external ear normal.     Nose: No nasal deformity, septal deviation, mucosal edema or rhinorrhea.     Right Turbinates: Enlarged, swollen and pale.  Left Turbinates: Enlarged, swollen and pale.     Right Sinus: No maxillary sinus tenderness or frontal sinus tenderness.     Left Sinus: No maxillary sinus tenderness or frontal sinus tenderness.     Mouth/Throat:     Mouth: Mucous membranes are not pale and not dry.     Pharynx: Uvula midline.  Eyes:     General: Lids are normal. Allergic shiner present.        Right eye: No discharge.        Left eye: No discharge.     Conjunctiva/sclera: Conjunctivae normal.     Right eye: Right conjunctiva is not injected. No chemosis.    Left eye: Left conjunctiva is not injected. No chemosis.    Pupils: Pupils are equal, round, and reactive to light.  Cardiovascular:     Rate and Rhythm: Normal rate and regular rhythm.     Heart sounds: Normal heart sounds.  Pulmonary:     Effort: No tachypnea, accessory muscle usage, respiratory distress or retractions.     Breath sounds: Normal breath sounds. No  wheezing, rhonchi or rales.     Comments: Moving air well in all lung fields. No increased work of breathing noted. No crackles.  Chest:     Chest wall: No tenderness.  Lymphadenopathy:     Cervical: No cervical adenopathy.  Skin:    General: Skin is warm.     Capillary Refill: Capillary refill takes less than 2 seconds.     Coloration: Skin is not pale.     Findings: No abrasion, erythema, petechiae or rash. Rash is not papular, urticarial or vesicular.  Neurological:     Mental Status: She is alert.  Psychiatric:        Behavior: Behavior is cooperative.      Diagnostic studies: COVID testing was negative    Salvatore Marvel, MD  [facility]

## 2022-11-13 NOTE — Addendum Note (Signed)
Addended by: Chip Boer R on: 11/13/2022 01:24 PM   Modules accepted: Orders

## 2022-11-20 ENCOUNTER — Ambulatory Visit (INDEPENDENT_AMBULATORY_CARE_PROVIDER_SITE_OTHER): Payer: BC Managed Care – PPO | Admitting: Nurse Practitioner

## 2022-11-20 ENCOUNTER — Encounter: Payer: Self-pay | Admitting: Nurse Practitioner

## 2022-11-20 VITALS — BP 130/76 | HR 74 | Temp 97.5°F | Ht <= 58 in | Wt 220.4 lb

## 2022-11-20 DIAGNOSIS — E1165 Type 2 diabetes mellitus with hyperglycemia: Secondary | ICD-10-CM

## 2022-11-20 DIAGNOSIS — I10 Essential (primary) hypertension: Secondary | ICD-10-CM | POA: Diagnosis not present

## 2022-11-20 LAB — POCT GLYCOSYLATED HEMOGLOBIN (HGB A1C)
HbA1c POC (<> result, manual entry): 0 % (ref 4.0–5.6)
HbA1c, POC (controlled diabetic range): 0 % (ref 0.0–7.0)
HbA1c, POC (prediabetic range): 0 % — AB (ref 5.7–6.4)
Hemoglobin A1C: 6.1 % — AB (ref 4.0–5.6)

## 2022-11-20 MED ORDER — LOSARTAN POTASSIUM 100 MG PO TABS: 100.0000 mg | ORAL_TABLET | Freq: Every day | ORAL | 3 refills | Status: DC

## 2022-11-20 MED ORDER — METFORMIN HCL ER 500 MG PO TB24: ORAL_TABLET | ORAL | 3 refills | Status: DC

## 2022-11-20 NOTE — Progress Notes (Signed)
Established Patient Visit  Patient: Mackenzie Weber   DOB: [birth year]   62 y.o. Female  MRN: [id number] Visit Date: 11/20/2022  Subjective:    Chief Complaint  Patient presents with   Follow-up    84mof/u. Taking meds most of the time. No home Bps. acBS 106-122. Fasting. No other concerns.   HPI Type 2 diabetes mellitus with hyperglycemia, without long-term current use of insulin (HCC) Repeat hgbA1c: 6.1% improved Current use of metformin with no adverse effects Did not start ozempic due to pending PA.  Maintain metformin dose, hearlthy diet and exercise  Hypertensive disorder BP at goal with losartan and maxzide BP Readings from Last 3 Encounters:  11/20/22 130/76  11/13/22 130/86  10/12/22 122/72    Maintain med dose  Wt Readings from Last 3 Encounters:  11/20/22 220 lb 6.4 oz (100 kg)  11/13/22 218 lb 12.8 oz (99.2 kg)  10/12/22 222 lb (100.7 kg)    Reviewed medical, surgical, and social history today  Medications: Outpatient Medications Prior to Visit  Medication Sig   albuterol (VENTOLIN HFA) 108 (90 Base) MCG/ACT inhaler INHALE 2 PUFFS BY MOUTH EVERY 4 HOURS AS NEEDED FOR WHEEZING FOR SHORTNESS OF BREATH   Albuterol-Budesonide (AIRSUPRA) 90-80 MCG/ACT AERO Inhale 2 puffs into the lungs as needed (maximum 12 puffs/day).   azelastine (ASTELIN) 0.1 % nasal spray 1-2 sprays  each nostril twice daily as needed   azithromycin (ZITHROMAX Z-PAK) 250 MG tablet If no improvement by Monday January 1st: Take 2 tablets on day 1 then one tablet daily for the following 4 days.   blood glucose meter kit and supplies KIT Check blood sugar once daily. One Touch Verio Reflect Meter   Budeson-Glycopyrrol-Formoterol (BREZTRI AEROSPHERE) 160-9-4.8 MCG/ACT AERO Inhale 2 puffs into the lungs daily. Use with spacer. Rinse, gargle and spit out after use.   EPINEPHrine (AUVI-Q) 0.3 mg/0.3 mL IJ SOAJ injection Inject 0.3 mg into the muscle as needed for anaphylaxis.   estradiol  (ESTRACE) 0.1 MG/GM vaginal cream Place 1 Applicatorful vaginally once a week.   famotidine (PEPCID) 20 MG tablet Take 1 tablet (20 mg total) by mouth at bedtime.   ipratropium (ATROVENT) 0.03 % nasal spray Place 2 sprays into both nostrils 3 (three) times daily.   Lancets (ONETOUCH DELICA PLUS [id number] MISC USE 1  TO CHECK GLUCOSE ONCE DAILY FOR BLOOD SUGAR   Multiple Vitamin (MULTIVITAMIN) capsule Take 1 capsule by mouth daily.   ONETOUCH VERIO test strip USE 1 STRIP TO CHECK GLUCOSE ONCE DAILY   pantoprazole (PROTONIX) 40 MG tablet Take 1 tablet (40 mg total) by mouth 2 (two) times daily.   Semaglutide,0.25 or 0.'5MG'$ /DOS, (OZEMPIC, 0.25 OR 0.5 MG/DOSE,) 2 MG/1.5ML SOPN 0.25ng weekly x 2weeks then 0.'5mg'$  weekly continuously   triamterene-hydrochlorothiazide (MAXZIDE) 75-50 MG tablet Take 0.5 tablets by mouth daily.   [DISCONTINUED] losartan (COZAAR) 100 MG tablet Take 1 tablet by mouth once daily   [DISCONTINUED] metFORMIN (GLUCOPHAGE-XR) 500 MG 24 hr tablet Take 1 tablet by mouth once daily with breakfast   [DISCONTINUED] amoxicillin-clavulanate (AUGMENTIN) 875-125 MG tablet Take 1 tablet by mouth 2 (two) times daily for 7 days. (Patient not taking: Reported on 11/20/2022)   [DISCONTINUED] menthol-cetylpyridinium (CEPACOL) 3 MG lozenge Take 1 lozenge (3 mg total) by mouth as needed for sore throat. (Patient not taking: Reported on 11/20/2022)   [DISCONTINUED] montelukast (SINGULAIR) 10 MG tablet Take 1 tablet (10 mg total)  by mouth at bedtime. FOR SEASONAL ALLERGY (Patient not taking: Reported on 11/20/2022)   [DISCONTINUED] NON FORMULARY 2 allergy injections every week (Patient not taking: Reported on 11/20/2022)   [DISCONTINUED] Olopatadine HCl (PATADAY) 0.2 % SOLN Place 1 drop into both eyes daily as needed. (Patient not taking: Reported on 11/20/2022)   No facility-administered medications prior to visit.   Reviewed past medical and social history.   ROS per HPI above  Last metabolic panel Lab  Results  Component Value Date   GLUCOSE 89 08/16/2022   NA 141 08/16/2022   K 3.6 08/16/2022   CL 104 08/16/2022   CO2 26 08/16/2022   BUN 14 08/16/2022   CREATININE 0.89 08/16/2022   GFRNONAA >60 11/08/2021   CALCIUM 9.7 08/16/2022   PHOS 3.6 08/16/2022   PROT 7.3 08/16/2022   ALBUMIN 4.3 08/16/2022   ALBUMIN 4.3 08/16/2022   LABGLOB 2.9 09/22/2021   AGRATIO 1.6 09/22/2021   BILITOT 0.4 08/16/2022   ALKPHOS 57 08/16/2022   AST 15 08/16/2022   ALT 11 08/16/2022   ANIONGAP 5 11/08/2021   Last hemoglobin A1c Lab Results  Component Value Date   HGBA1C 6.1 (A) 11/20/2022   HGBA1C 0 11/20/2022   HGBA1C 0 (A) 11/20/2022   HGBA1C 0.0 11/20/2022        Objective:  BP 130/76 (BP Location: Left Arm, Patient Position: Sitting)   Pulse 74   Temp (!) 97.5 F (36.4 C) (Temporal)   Ht '4\' 10"'$  (1.473 m)   Wt 220 lb 6.4 oz (100 kg)   SpO2 98%   BMI 46.06 kg/m      Physical Exam Cardiovascular:     Rate and Rhythm: Normal rate.     Pulses: Normal pulses.  Pulmonary:     Effort: Pulmonary effort is normal.  Neurological:     Mental Status: She is alert and oriented to person, place, and time.     Results for orders placed or performed in visit on 11/20/22  POCT glycosylated hemoglobin (Hb A1C)  Result Value Ref Range   Hemoglobin A1C 6.1 (A) 4.0 - 5.6 %   HbA1c POC (<> result, manual entry) 0 4.0 - 5.6 %   HbA1c, POC (prediabetic range) 0 (A) 5.7 - 6.4 %   HbA1c, POC (controlled diabetic range) 0.0 0.0 - 7.0 %      Assessment & Plan:    Problem List Items Addressed This Visit       Cardiovascular and Mediastinum   Hypertensive disorder - Primary    BP at goal with losartan and maxzide BP Readings from Last 3 Encounters:  11/20/22 130/76  11/13/22 130/86  10/12/22 122/72    Maintain med dose      Relevant Medications   losartan (COZAAR) 100 MG tablet     Endocrine   Type 2 diabetes mellitus with hyperglycemia, without long-term current use of insulin  (HCC)    Repeat hgbA1c: 6.1% improved Current use of metformin with no adverse effects Did not start ozempic due to pending PA.  Maintain metformin dose, hearlthy diet and exercise      Relevant Medications   losartan (COZAAR) 100 MG tablet   metFORMIN (GLUCOPHAGE-XR) 500 MG 24 hr tablet   Other Relevant Orders   POCT glycosylated hemoglobin (Hb A1C) (Completed)   Other Visit Diagnoses     Essential hypertension       Relevant Medications   losartan (COZAAR) 100 MG tablet      Return in about 3  months (around 02/18/2023) for HTN, DM, hyperlipidemia (fasting).     Wilfred Lacy, NP

## 2022-11-20 NOTE — Patient Instructions (Addendum)
hgbA1c at 6.1%: improved Sign medical release to get records from Dr. Florene Glen Schedule appt for annual mammogram.

## 2022-11-20 NOTE — Assessment & Plan Note (Signed)
Repeat hgbA1c: 6.1% improved Current use of metformin with no adverse effects Did not start ozempic due to pending PA.  Maintain metformin dose, hearlthy diet and exercise

## 2022-11-20 NOTE — Assessment & Plan Note (Signed)
BP at goal with losartan and maxzide BP Readings from Last 3 Encounters:  11/20/22 130/76  11/13/22 130/86  10/12/22 122/72    Maintain med dose

## 2022-11-22 ENCOUNTER — Ambulatory Visit: Payer: BC Managed Care – PPO | Admitting: Allergy & Immunology

## 2022-11-29 ENCOUNTER — Other Ambulatory Visit (HOSPITAL_COMMUNITY): Payer: Self-pay

## 2022-11-29 ENCOUNTER — Other Ambulatory Visit: Payer: Self-pay

## 2022-11-29 ENCOUNTER — Telehealth: Payer: Self-pay | Admitting: Nurse Practitioner

## 2022-11-29 DIAGNOSIS — E1165 Type 2 diabetes mellitus with hyperglycemia: Secondary | ICD-10-CM

## 2022-11-29 MED ORDER — SEMAGLUTIDE (2 MG/DOSE) 8 MG/3ML ~~LOC~~ SOPN: 2.0000 mg | PEN_INJECTOR | SUBCUTANEOUS | 2 refills | Status: DC

## 2022-11-29 NOTE — Telephone Encounter (Signed)
Caller Name: Mahathi Call back phone #: [phone]  Reason for Call: Pt is upset because she called last week and has not gotten a call back. She has been waiting for her ozempic to be refilled for awhile. Please call.

## 2022-11-29 NOTE — Telephone Encounter (Signed)
PA submitted in [facility] on 11/29/22 by Angeline Slim. Key: [id number] Status: Pending

## 2022-12-06 ENCOUNTER — Other Ambulatory Visit (HOSPITAL_COMMUNITY): Payer: Self-pay

## 2022-12-11 ENCOUNTER — Other Ambulatory Visit: Payer: Self-pay

## 2022-12-11 MED ORDER — OZEMPIC (0.25 OR 0.5 MG/DOSE) 2 MG/3ML ~~LOC~~ SOPN: 0.5000 mg | PEN_INJECTOR | SUBCUTANEOUS | 2 refills | Status: DC

## 2022-12-13 ENCOUNTER — Other Ambulatory Visit: Payer: Self-pay | Admitting: Nurse Practitioner

## 2022-12-13 ENCOUNTER — Other Ambulatory Visit: Payer: Self-pay

## 2022-12-13 DIAGNOSIS — E1165 Type 2 diabetes mellitus with hyperglycemia: Secondary | ICD-10-CM

## 2022-12-13 MED ORDER — ONETOUCH VERIO VI STRP: ORAL_STRIP | 0 refills | Status: AC

## 2023-02-12 ENCOUNTER — Ambulatory Visit: Payer: BC Managed Care – PPO | Admitting: Allergy & Immunology

## 2023-02-13 ENCOUNTER — Ambulatory Visit (INDEPENDENT_AMBULATORY_CARE_PROVIDER_SITE_OTHER): Payer: BC Managed Care – PPO | Admitting: Nurse Practitioner

## 2023-02-13 ENCOUNTER — Encounter: Payer: Self-pay | Admitting: Nurse Practitioner

## 2023-02-13 VITALS — BP 116/78 | HR 78 | Temp 97.6°F | Resp 16 | Ht <= 58 in | Wt 216.0 lb

## 2023-02-13 DIAGNOSIS — Z7985 Long-term (current) use of injectable non-insulin antidiabetic drugs: Secondary | ICD-10-CM

## 2023-02-13 DIAGNOSIS — Z13220 Encounter for screening for lipoid disorders: Secondary | ICD-10-CM

## 2023-02-13 DIAGNOSIS — E1169 Type 2 diabetes mellitus with other specified complication: Secondary | ICD-10-CM | POA: Diagnosis not present

## 2023-02-13 DIAGNOSIS — Z136 Encounter for screening for cardiovascular disorders: Secondary | ICD-10-CM | POA: Diagnosis not present

## 2023-02-13 DIAGNOSIS — I10 Essential (primary) hypertension: Secondary | ICD-10-CM

## 2023-02-13 DIAGNOSIS — Z7984 Long term (current) use of oral hypoglycemic drugs: Secondary | ICD-10-CM

## 2023-02-13 LAB — LIPID PANEL
Cholesterol: 177 mg/dL (ref 0–200)
HDL: 64.9 mg/dL (ref >39.00)
LDL Cholesterol: 100 mg/dL — ABNORMAL HIGH (ref 0–99)
NonHDL: 112.44
Total CHOL/HDL Ratio: 3
Triglycerides: 62 mg/dL (ref 0.0–149.0)
VLDL: 12.4 mg/dL (ref 0.0–40.0)

## 2023-02-13 LAB — COMPREHENSIVE METABOLIC PANEL
ALT: 11 U/L (ref 0–35)
AST: 12 U/L (ref 0–37)
Albumin: 3.9 g/dL (ref 3.5–5.2)
Alkaline Phosphatase: 54 U/L (ref 39–117)
BUN: 16 mg/dL (ref 6–23)
CO2: 29 mEq/L (ref 19–32)
Calcium: 9.7 mg/dL (ref 8.4–10.5)
Chloride: 104 mEq/L (ref 96–112)
Creatinine, Ser: 0.96 mg/dL (ref 0.40–1.20)
GFR: 64.09 mL/min (ref >60.00)
Glucose, Bld: 85 mg/dL (ref 70–99)
Potassium: 4 mEq/L (ref 3.5–5.1)
Sodium: 141 mEq/L (ref 135–145)
Total Bilirubin: 0.3 mg/dL (ref 0.2–1.2)
Total Protein: 6.8 g/dL (ref 6.0–8.3)

## 2023-02-13 LAB — MICROALBUMIN / CREATININE URINE RATIO
Creatinine,U: 156 mg/dL
Microalb Creat Ratio: 0.5 mg/g (ref 0.0–30.0)
Microalb, Ur: 0.8 mg/dL (ref 0.0–1.9)

## 2023-02-13 LAB — HEMOGLOBIN A1C: Hgb A1c MFr Bld: 6.6 % — ABNORMAL HIGH (ref 4.6–6.5)

## 2023-02-13 MED ORDER — OZEMPIC (0.25 OR 0.5 MG/DOSE) 2 MG/3ML ~~LOC~~ SOPN
0.5000 mg | PEN_INJECTOR | SUBCUTANEOUS | 1 refills | Status: DC
Start: 2023-02-13 — End: 2023-08-22

## 2023-02-13 NOTE — Patient Instructions (Signed)
Go to lab Maintain current meds

## 2023-02-13 NOTE — Assessment & Plan Note (Signed)
Controlled with Hgb 6.1% Current use of metformin and ozempic with no adverse effects Uptodate with eye exam No neuropathy  Repeat hgbA1c, UACr, CMP and lipid panel today Maintain med doses F/up in 6months

## 2023-02-13 NOTE — Assessment & Plan Note (Signed)
BP at goal with losartan and maxzide BP Readings from Last 3 Encounters:  02/13/23 116/78  11/20/22 130/76  11/13/22 130/86    Repeat CMP Maintain med doses

## 2023-02-13 NOTE — Progress Notes (Signed)
Established Patient Visit  Patient: Mackenzie Weber   DOB: [birth year]   62 y.o. Female  MRN: [id number] Visit Date: 02/13/2023  Subjective:    Chief Complaint  Patient presents with   Medical Management of Chronic Issues    Fasting- Yes - Refill of Losartan   HPI DM (diabetes mellitus) (HCC) Controlled with Hgb 6.1% Current use of metformin and ozempic with no adverse effects Uptodate with eye exam No neuropathy  Repeat hgbA1c, UACr, CMP and lipid panel today Maintain med doses F/up in 6months  Hypertensive disorder BP at goal with losartan and maxzide BP Readings from Last 3 Encounters:  02/13/23 116/78  11/20/22 130/76  11/13/22 130/86    Repeat CMP Maintain med doses  Wt Readings from Last 3 Encounters:  02/13/23 216 lb (98 kg)  11/20/22 220 lb 6.4 oz (100 kg)  11/13/22 218 lb 12.8 oz (99.2 kg)     Reviewed medical, surgical, and social history today  Medications: Outpatient Medications Prior to Visit  Medication Sig   albuterol (VENTOLIN HFA) 108 (90 Base) MCG/ACT inhaler INHALE 2 PUFFS BY MOUTH EVERY 4 HOURS AS NEEDED FOR WHEEZING FOR SHORTNESS OF BREATH   Albuterol-Budesonide (AIRSUPRA) 90-80 MCG/ACT AERO Inhale 2 puffs into the lungs as needed (maximum 12 puffs/day).   azelastine (ASTELIN) 0.1 % nasal spray 1-2 sprays  each nostril twice daily as needed   blood glucose meter kit and supplies KIT Check blood sugar once daily. One Touch Verio Reflect Meter   Budeson-Glycopyrrol-Formoterol (BREZTRI AEROSPHERE) 160-9-4.8 MCG/ACT AERO Inhale 2 puffs into the lungs daily. Use with spacer. Rinse, gargle and spit out after use.   EPINEPHrine (AUVI-Q) 0.3 mg/0.3 mL IJ SOAJ injection Inject 0.3 mg into the muscle as needed for anaphylaxis.   estradiol (ESTRACE) 0.1 MG/GM vaginal cream Place 1 Applicatorful vaginally once a week.   famotidine (PEPCID) 20 MG tablet Take 1 tablet (20 mg total) by mouth at bedtime.   glucose blood (ONETOUCH VERIO) test strip  Use as instructed   ipratropium (ATROVENT) 0.03 % nasal spray Place 2 sprays into both nostrils 3 (three) times daily.   Lancets (ONETOUCH DELICA PLUS LANCET33G) MISC USE 1  TO CHECK GLUCOSE ONCE DAILY FOR BLOOD SUGAR   losartan (COZAAR) 100 MG tablet Take 1 tablet (100 mg total) by mouth daily.   metFORMIN (GLUCOPHAGE-XR) 500 MG 24 hr tablet Take 1 tablet by mouth once daily with breakfast   Multiple Vitamin (MULTIVITAMIN) capsule Take 1 capsule by mouth daily.   pantoprazole (PROTONIX) 40 MG tablet Take 1 tablet (40 mg total) by mouth 2 (two) times daily.   triamterene-hydrochlorothiazide (MAXZIDE) 75-50 MG tablet Take 0.5 tablets by mouth daily.   [DISCONTINUED] azithromycin (ZITHROMAX Z-PAK) 250 MG tablet If no improvement by Monday January 1st: Take 2 tablets on day 1 then one tablet daily for the following 4 days.   [DISCONTINUED] Semaglutide,0.25 or 0.5MG /DOS, (OZEMPIC, 0.25 OR 0.5 MG/DOSE,) 2 MG/3ML SOPN Inject 0.5 mg into the skin once a week.   No facility-administered medications prior to visit.   Reviewed past medical and social history.   ROS per HPI above      Objective:  BP 116/78 (BP Location: Right Arm, Patient Position: Sitting, Cuff Size: Large)   Pulse 78   Temp 97.6 F (36.4 C) (Temporal)   Resp 16   Ht 4\' 10"  (1.473 m)   Wt 216 lb (98 kg)  SpO2 99%   BMI 45.14 kg/m      Physical Exam Vitals and nursing note reviewed.  Cardiovascular:     Rate and Rhythm: Normal rate and regular rhythm.     Pulses: Normal pulses.     Heart sounds: Normal heart sounds.  Pulmonary:     Effort: Pulmonary effort is normal.     Breath sounds: Normal breath sounds.  Musculoskeletal:     Right lower leg: Edema present.     Left lower leg: Edema present.  Skin:    Findings: No erythema.  Neurological:     Mental Status: She is alert and oriented to person, place, and time.     No results found for any visits on 02/13/23.    Assessment & Plan:    Problem List Items  Addressed This Visit       Cardiovascular and Mediastinum   Hypertensive disorder    BP at goal with losartan and maxzide BP Readings from Last 3 Encounters:  02/13/23 116/78  11/20/22 130/76  11/13/22 130/86    Repeat CMP Maintain med doses      Relevant Orders   Comprehensive metabolic panel     Endocrine   DM (diabetes mellitus) (HCC) - Primary    Controlled with Hgb 6.1% Current use of metformin and ozempic with no adverse effects Uptodate with eye exam No neuropathy  Repeat hgbA1c, UACr, CMP and lipid panel today Maintain med doses F/up in 6months      Relevant Medications   Semaglutide,0.25 or 0.5MG /DOS, (OZEMPIC, 0.25 OR 0.5 MG/DOSE,) 2 MG/3ML SOPN   Other Relevant Orders   Comprehensive metabolic panel   Hemoglobin A1c   Lipid panel   Microalbumin / creatinine urine ratio   Other Visit Diagnoses     Encounter for lipid screening for cardiovascular disease       Relevant Orders   Lipid panel      Return in about 6 months (around 08/16/2023) for HTN, DM.     Alysia Penna, NP

## 2023-03-07 ENCOUNTER — Ambulatory Visit: Payer: BC Managed Care – PPO | Admitting: Allergy & Immunology

## 2023-03-18 ENCOUNTER — Other Ambulatory Visit: Payer: Self-pay

## 2023-03-18 ENCOUNTER — Ambulatory Visit: Payer: BC Managed Care – PPO | Admitting: Family Medicine

## 2023-03-18 ENCOUNTER — Encounter: Payer: Self-pay | Admitting: Family Medicine

## 2023-03-18 VITALS — BP 138/82 | HR 68 | Temp 98.1°F | Ht 60.0 in | Wt 217.5 lb

## 2023-03-18 DIAGNOSIS — K219 Gastro-esophageal reflux disease without esophagitis: Secondary | ICD-10-CM | POA: Diagnosis not present

## 2023-03-18 DIAGNOSIS — J3089 Other allergic rhinitis: Secondary | ICD-10-CM | POA: Diagnosis not present

## 2023-03-18 DIAGNOSIS — J4540 Moderate persistent asthma, uncomplicated: Secondary | ICD-10-CM | POA: Diagnosis not present

## 2023-03-18 DIAGNOSIS — J302 Other seasonal allergic rhinitis: Secondary | ICD-10-CM

## 2023-03-18 NOTE — Patient Instructions (Addendum)
Asthma Restart Breztri 2 puffs twice a day with a spacer to prevent cough or wheeze Continue albuterol 2 puffs once every 4 hours as needed for cough or wheeze You may use albuterol 2 puffs 5 to 15 minutes before activity to decrease cough or wheeze  Allergic rhinitis Continue allergen avoidance measures directed toward pollens, pets, mold, cockroach, and dust mite as listed below Continue Allegra 180 mg once a day as needed for runny nose or itch.   Continue azelastine 2 sprays in each nostril twice a day as needed for runny nose Continue Atrovent 2 sprays in each nostril twice a day as needed for runny nose Begin Flonase 2 sprays in each nostril once a day as needed for a stuffy nose.  In the right nostril, point the applicator out toward the right ear. In the left nostril, point the applicator out toward the left ear Consider saline nasal rinses as needed for nasal symptoms. Use this before any medicated nasal sprays for best result  Reflux Continue dietary and lifestyle modifications as listed below Continue to follow the treatment plan as listed by your gastrointestinal specialist  Call the clinic your symptoms worsen or you develop a fever.  Follow up in 3 months or sooner if needed.   Lifestyle Changes for Controlling GERD When you have GERD, stomach acid feels as if it's backing up toward your mouth. Whether or not you take medication to control your GERD, your symptoms can often be improved with lifestyle changes.   Raise Your Head Reflux is more likely to strike when you're lying down flat, because stomach fluid can flow backward more easily. Raising the head of your bed 4-6 inches can help. To do this: Slide blocks or books under the legs at the head of your bed. Or, place a wedge under the mattress. Many foam stores can make a suitable wedge for you. The wedge should run from your waist to the top of your head. Don't just prop your head on several pillows. This increases  pressure on your stomach. It can make GERD worse.  Watch Your Eating Habits Certain foods may increase the acid in your stomach or relax the lower esophageal sphincter, making GERD more likely. It's best to avoid the following: Coffee, tea, and carbonated drinks (with and without caffeine) Fatty, fried, or spicy food Mint, chocolate, onions, and tomatoes Any other foods that seem to irritate your stomach or cause you pain  Relieve the Pressure Eat smaller meals, even if you have to eat more often. Don't lie down right after you eat. Wait a few hours for your stomach to empty. Avoid tight belts and tight-fitting clothes. Lose excess weight.  Tobacco and Alcohol Avoid smoking tobacco and drinking alcohol. They can make GERD symptoms worse.  Reducing Pollen Exposure The American Academy of Allergy, Asthma and Immunology suggests the following steps to reduce your exposure to pollen during allergy seasons. Do not hang sheets or clothing out to dry; pollen may collect on these items. Do not mow lawns or spend time around freshly cut grass; mowing stirs up pollen. Keep windows closed at night.  Keep car windows closed while driving. Minimize morning activities outdoors, a time when pollen counts are usually at their highest. Stay indoors as much as possible when pollen counts or humidity is high and on windy days when pollen tends to remain in the air longer. Use air conditioning when possible.  Many air conditioners have filters that trap the pollen spores. Use a  HEPA room air filter to remove pollen form the indoor air you breathe.  Control of Dog or Cat Allergen Avoidance is the best way to manage a dog or cat allergy. If you have a dog or cat and are allergic to dog or cats, consider removing the dog or cat from the home. If you have a dog or cat but don't want to find it a new home, or if your family wants a pet even though someone in the household is allergic, here are some strategies  that may help keep symptoms at bay:  Keep the pet out of your bedroom and restrict it to only a few rooms. Be advised that keeping the dog or cat in only one room will not limit the allergens to that room. Don't pet, hug or kiss the dog or cat; if you do, wash your hands with soap and water. High-efficiency particulate air (HEPA) cleaners run continuously in a bedroom or living room can reduce allergen levels over time. Regular use of a high-efficiency vacuum cleaner or a central vacuum can reduce allergen levels. Giving your dog or cat a bath at least once a week can reduce airborne allergen.  Control of Mold Allergen Mold and fungi can grow on a variety of surfaces provided certain temperature and moisture conditions exist.  Outdoor molds grow on plants, decaying vegetation and soil.  The major outdoor mold, Alternaria and Cladosporium, are found in very high numbers during hot and dry conditions.  Generally, a late Summer - Fall peak is seen for common outdoor fungal spores.  Rain will temporarily lower outdoor mold spore count, but counts rise rapidly when the rainy period ends.  The most important indoor molds are Aspergillus and Penicillium.  Dark, humid and poorly ventilated basements are ideal sites for mold growth.  The next most common sites of mold growth are the bathroom and the kitchen.  Outdoor [employer] Use air conditioning and keep windows closed Avoid exposure to decaying vegetation. Avoid leaf raking. Avoid grain handling. Consider wearing a face mask if working in moldy areas.  Indoor Mold Control Maintain humidity below 50%. Clean washable surfaces with 5% bleach solution. Remove sources e.g. Contaminated carpets.   Control of Dust Mite Allergen Dust mites play a major role in allergic asthma and rhinitis. They occur in environments with high humidity wherever human skin is found. Dust mites absorb humidity from the atmosphere (ie, they do not drink) and feed on  organic matter (including shed human and animal skin). Dust mites are a microscopic type of insect that you cannot see with the naked eye. High levels of dust mites have been detected from mattresses, pillows, carpets, upholstered furniture, bed covers, clothes, soft toys and any woven material. The principal allergen of the dust mite is found in its feces. A gram of dust may contain 1,000 mites and 250,000 fecal particles. Mite antigen is easily measured in the air during house cleaning activities. Dust mites do not bite and do not cause harm to humans, other than by triggering allergies/asthma.  Ways to decrease your exposure to dust mites in your home:  1. Encase mattresses, box springs and pillows with a mite-impermeable barrier or cover  2. Wash sheets, blankets and drapes weekly in hot water (130 F) with detergent and dry them in a dryer on the hot setting.  3. Have the room cleaned frequently with a vacuum cleaner and a damp dust-mop. For carpeting or rugs, vacuuming with a vacuum cleaner equipped with  a high-efficiency particulate air (HEPA) filter. The dust mite allergic individual should not be in a room which is being cleaned and should wait 1 hour after cleaning before going into the room.  4. Do not sleep on upholstered furniture (eg, couches).  5. If possible removing carpeting, upholstered furniture and drapery from the home is ideal. Horizontal blinds should be eliminated in the rooms where the person spends the most time (bedroom, study, television room). Washable vinyl, roller-type shades are optimal.  6. Remove all non-washable stuffed toys from the bedroom. Wash stuffed toys weekly like sheets and blankets above.  7. Reduce indoor humidity to less than 50%. Inexpensive humidity monitors can be purchased at most hardware stores. Do not use a humidifier as can make the problem worse and are not recommended.  Control of Cockroach Allergen Cockroach allergen has been identified as  an important cause of acute attacks of asthma, especially in urban settings.  There are fifty-five species of cockroach that exist in the [place], however only three, the [place], [place] species produce allergen that can affect patients with Asthma.  Allergens can be obtained from fecal particles, egg casings and secretions from cockroaches.    Remove food sources. Reduce access to water. Seal access and entry points. Spray runways with 0.5-1% Diazinon or Chlorpyrifos Blow boric acid power under stoves and refrigerator. Place bait stations (hydramethylnon) at feeding sites.

## 2023-03-18 NOTE — Addendum Note (Signed)
Addended by: Kellie Simmering, Annaleigh Steinmeyer on: 03/18/2023 04:36 PM   Modules accepted: Orders

## 2023-03-18 NOTE — Progress Notes (Signed)
[address] [place] [address] Dept: [phone]  FOLLOW UP NOTE  Patient ID: Mackenzie Weber, female    DOB: 1961/03/08  Age: 62 y.o. MRN: [id number] Date of Office Visit: 03/18/2023  Assessment  Chief Complaint: Asthma, Follow-up, and Wheezing (She has  wheeze in her nasal passage)  HPI Mackenzie Weber is a 62 year old female who presents to the clinic for follow-up visit.  She was last seen in this clinic on 10/17/2022 by Dr. Dellis Anes for evaluation of asthma, allergic rhinitis, reflux, and acute sinusitis requiring Augmentin for relief of symptoms.    At today's visit, she reports her asthma has been well-controlled with no shortness of breath, cough, or wheeze.  She reports that she has several different inhalers including air supra, Symbicort, and Breztri.  She reports that she rarely uses any of these inhalers  at this time.   Allergic rhinitis is reported as moderately well-controlled with symptoms including occasional clear rhinorrhea and nasal congestion.  She reports that she can hear a wheeze in her nose over the last several days.  She continues azelastine, saline nasal rinses, and saline nasal gel as needed.  Her last environmental allergy testing was on 05/30/2020 and was positive to grass pollen, weed pollen, ragweed pollen, tree pollen, mold, dust mite, cat, and cockroach.  She received allergen immunotherapy for several years with her last injection in January 2023.  Reflux is reported as moderately well-controlled with occasional heartburn as the main symptom.  She continues pantoprazole and occasionally takes famotidine.  She follows up with Dr. Chales Abrahams, GI specialist.  Her current medications are listed in the chart.   Drug Allergies:  Allergies  Allergen Reactions   Methylprednisolone Acetate Other (See Comments) and Rash   Cortisone     Red area around injection site x   Depo-Medrol [Methylprednisolone Sodium Succ] Nausea Only and Other (See Comments)     Dizziness   Aspirin Anxiety and Other (See Comments)    Rapid Heart beat    Physical Exam: BP 138/82   Pulse 68   Temp 98.1 F (36.7 C) (Temporal)   Ht 5' (1.524 m)   Wt 217 lb 8 oz (98.7 kg)   SpO2 97%   BMI 42.48 kg/m    Physical Exam Vitals reviewed.  Constitutional:      Appearance: Normal appearance.  HENT:     Head: Normocephalic and atraumatic.     Right Ear: Tympanic membrane normal.     Left Ear: Tympanic membrane normal.     Nose:     Comments: Bilateral nares grossly edematous and pale with thin clear nasal drainage noted.  Pharynx normal.  Ears normal.  Eyes normal.    Mouth/Throat:     Pharynx: Oropharynx is clear.  Eyes:     Conjunctiva/sclera: Conjunctivae normal.  Cardiovascular:     Rate and Rhythm: Normal rate and regular rhythm.     Heart sounds: Normal heart sounds. No murmur heard. Pulmonary:     Effort: Pulmonary effort is normal.     Breath sounds: Normal breath sounds.     Comments: Lungs clear to auscultation Musculoskeletal:        General: Normal range of motion.     Cervical back: Normal range of motion and neck supple.  Skin:    General: Skin is warm and dry.  Neurological:     Mental Status: She is alert and oriented to person, place, and time.  Psychiatric:  Mood and Affect: Mood normal.        Behavior: Behavior normal.        Thought Content: Thought content normal.        Judgment: Judgment normal.     Diagnostics: FVC 1.49 which is 64% of predicted value, FEV1 0.98 which is 52% of predicted value.  Spirometry indicates moderate airway obstruction.  Postbronchodilator spirometry FVC 1.64, FEV1 1.21.  FEV1 with 23.47% improvement  Assessment and Plan: 1. Not well controlled moderate persistent asthma   2. Seasonal and perennial allergic rhinitis   3. Gastroesophageal reflux disease without esophagitis     No orders of the defined types were placed in this encounter.   Patient Instructions  Asthma Restart  Breztri 2 puffs twice a day with a spacer to prevent cough or wheeze Continue albuterol 2 puffs once every 4 hours as needed for cough or wheeze You may use albuterol 2 puffs 5 to 15 minutes before activity to decrease cough or wheeze  Allergic rhinitis Continue allergen avoidance measures directed toward pollens, pets, mold, cockroach, and dust mite as listed below Continue Allegra 180 mg once a day as needed for runny nose or itch.   Continue azelastine 2 sprays in each nostril twice a day as needed for runny nose Continue Atrovent 2 sprays in each nostril twice a day as needed for runny nose Begin Flonase 2 sprays in each nostril once a day as needed for a stuffy nose.  In the right nostril, point the applicator out toward the right ear. In the left nostril, point the applicator out toward the left ear Consider saline nasal rinses as needed for nasal symptoms. Use this before any medicated nasal sprays for best result  Reflux Continue dietary and lifestyle modifications as listed below Continue to follow the treatment plan as listed by your gastrointestinal specialist  Call the clinic your symptoms worsen or you develop a fever.  Follow up in 3 months or sooner if needed.   Return in about 3 months (around 06/18/2023), or if symptoms worsen or fail to improve.    Thank you for the opportunity to care for this patient.  Please do not hesitate to contact me with questions.  Thermon Leyland, FNP [facility]

## 2023-04-10 MED ORDER — BREZTRI AEROSPHERE 160-9-4.8 MCG/ACT IN AERO: 2.0000 | INHALATION_SPRAY | Freq: Every day | RESPIRATORY_TRACT | 5 refills | Status: DC

## 2023-04-10 MED ORDER — FLUTICASONE PROPIONATE 50 MCG/ACT NA SUSP: NASAL | 5 refills | Status: DC

## 2023-04-10 NOTE — Addendum Note (Signed)
Addended by: Orson Aloe on: 04/10/2023 01:45 PM   Modules accepted: Orders

## 2023-04-19 ENCOUNTER — Other Ambulatory Visit: Payer: Self-pay | Admitting: Nurse Practitioner

## 2023-04-19 DIAGNOSIS — R600 Localized edema: Secondary | ICD-10-CM

## 2023-04-19 DIAGNOSIS — I10 Essential (primary) hypertension: Secondary | ICD-10-CM

## 2023-04-23 ENCOUNTER — Other Ambulatory Visit: Payer: Self-pay

## 2023-04-23 ENCOUNTER — Telehealth: Payer: Self-pay | Admitting: Nurse Practitioner

## 2023-04-23 DIAGNOSIS — I10 Essential (primary) hypertension: Secondary | ICD-10-CM

## 2023-04-23 DIAGNOSIS — R600 Localized edema: Secondary | ICD-10-CM

## 2023-04-23 MED ORDER — TRIAMTERENE-HCTZ 75-50 MG PO TABS
0.5000 | ORAL_TABLET | Freq: Every day | ORAL | 1 refills | Status: DC
Start: 2023-04-23 — End: 2024-02-19

## 2023-04-23 NOTE — Telephone Encounter (Signed)
Prescription Request  04/23/2023  LOV: 02/13/2023  What is the name of the medication or equipment? triamterene-hydrochlorothiazide (MAXZIDE) 75-50 MG tablet   Have you contacted your pharmacy to request a refill? No   Which pharmacy would you like this sent to?  [id number] - [place], [place] - [address] [place] [address] Phone: [phone] Fax: [phone]    Patient notified that their request is being sent to the clinical staff for review and that they should receive a response within 2 business days.   Please advise at Mobile [phone] (mobile)

## 2023-04-23 NOTE — Telephone Encounter (Signed)
Sent!

## 2023-07-10 ENCOUNTER — Telehealth: Payer: Self-pay | Admitting: Nurse Practitioner

## 2023-07-10 NOTE — Telephone Encounter (Signed)
Please advise, see below.

## 2023-07-10 NOTE — Telephone Encounter (Signed)
Pt is ready to have the dosage raised and is requesting a refill.  Semaglutide,0.25 or 0.5MG /DOS, (OZEMPIC, 0.25 OR 0.5 MG/DOSE,) 2 MG/3ML [facility] [[id number]]   [id number] - [place], [place] - [address], [place] [address] Phone: [phone]  Fax: [phone]

## 2023-07-10 NOTE — Telephone Encounter (Signed)
Patient is aware of annotation below and verbalized understanding.

## 2023-07-16 ENCOUNTER — Telehealth: Payer: Self-pay

## 2023-07-16 NOTE — Telephone Encounter (Signed)
Received PA from covermymeds. Med is Ozempic (0.25 or 0.5 MG/Dose) pen-injectors. Please assist with PA.

## 2023-07-17 ENCOUNTER — Telehealth: Payer: Self-pay

## 2023-07-17 ENCOUNTER — Other Ambulatory Visit (HOSPITAL_COMMUNITY): Payer: Self-pay

## 2023-07-17 NOTE — Telephone Encounter (Signed)
Pharmacy Patient Advocate Encounter   Received notification from Pt Calls Messages that prior authorization for Ozempic 2mg /33ml is required/requested.   Insurance verification completed.   The patient is insured through [facility] .   Per test claim: PA required; PA submitted to [employer] [facility] via CoverMyMeds Key/confirmation #/EOC  [id number] Status is pending

## 2023-07-18 NOTE — Telephone Encounter (Signed)
Pt advised.

## 2023-07-18 NOTE — Telephone Encounter (Signed)
Pharmacy Patient Advocate Encounter  Received notification from [employer] that Prior Authorization for Ozempic has been APPROVED from 07/17/23 to 07/16/26   PA #/Case ID/Reference #: [id number]    Approval letter indexed to media tab

## 2023-07-31 ENCOUNTER — Other Ambulatory Visit: Payer: Self-pay | Admitting: Allergy & Immunology

## 2023-08-08 ENCOUNTER — Telehealth: Payer: Self-pay | Admitting: Family Medicine

## 2023-08-08 NOTE — Telephone Encounter (Signed)
Samples at front desk waiting for pt to pick up left message stating they are ready for pick up at her convince

## 2023-08-08 NOTE — Telephone Encounter (Signed)
Patient called to get some samples of the medication Breztri Aerosphere Budeson-Glycopyrrol-Formoterol (BREZTRI AEROSPHERE) 160-9-4.8 MCG/ACT AERO

## 2023-08-15 ENCOUNTER — Ambulatory Visit: Payer: BC Managed Care – PPO | Admitting: Nurse Practitioner

## 2023-08-20 ENCOUNTER — Encounter (HOSPITAL_BASED_OUTPATIENT_CLINIC_OR_DEPARTMENT_OTHER): Payer: Self-pay | Admitting: Emergency Medicine

## 2023-08-20 ENCOUNTER — Other Ambulatory Visit: Payer: Self-pay

## 2023-08-20 ENCOUNTER — Telehealth: Payer: Self-pay | Admitting: Nurse Practitioner

## 2023-08-20 ENCOUNTER — Emergency Department (HOSPITAL_BASED_OUTPATIENT_CLINIC_OR_DEPARTMENT_OTHER)
Admission: EM | Admit: 2023-08-20 | Discharge: 2023-08-20 | Disposition: A | Discharge status: Home / Self Care | Payer: BC Managed Care – PPO | Attending: Emergency Medicine | Admitting: Emergency Medicine

## 2023-08-20 DIAGNOSIS — T63441A Toxic effect of venom of bees, accidental (unintentional), initial encounter: Secondary | ICD-10-CM | POA: Insufficient documentation

## 2023-08-20 MED ORDER — DEXAMETHASONE 4 MG PO TABS
10.0000 mg | ORAL_TABLET | Freq: Once | ORAL | Status: AC
  Administered 2023-08-20: 10 mg via ORAL
  Filled 2023-08-20: qty 3

## 2023-08-20 MED ORDER — CEPHALEXIN 500 MG PO CAPS: ORAL_CAPSULE | ORAL | 0 refills | Status: DC

## 2023-08-20 NOTE — Telephone Encounter (Signed)
Pt was seen in the ED for bee sting on today. She felt her Bp was higher than usual and was suggested she make an appt to see her pcp. She is going to call back on Wednesday to set up the appt on Thursday. Unless something cancels.

## 2023-08-20 NOTE — ED Provider Notes (Signed)
[facility] [place] Provider Note   CSN: [id number] Arrival date & time: 08/20/23  0415     History  Chief Complaint  Patient presents with   Insect Bite    Mackenzie Weber is a 62 y.o. female.  62 yo F with a chief complaints of being stung by a bee.  This actually happened a couple days ago.  He woke up in the melanite and felt like things got worse.  She thinks it was something in her finger and so she came for evaluation.        Home Medications Prior to Admission medications   Medication Sig Start Date End Date Taking? Authorizing Provider  cephALEXin (KEFLEX) 500 MG capsule 2 caps po bid x 7 days 08/20/23  Yes Melene Plan, DO  albuterol (VENTOLIN HFA) 108 (90 Base) MCG/ACT inhaler INHALE 2 PUFFS BY MOUTH EVERY 6 HOURS AS NEEDED FOR WHEEZING FOR SHORTNESS OF BREATH 07/31/23   Alfonse Spruce, MD  Albuterol-Budesonide [facility]) 90-80 MCG/ACT AERO Inhale 2 puffs into the lungs as needed (maximum 12 puffs/day). 10/12/22   Verlee Monte, MD  azelastine (ASTELIN) 0.1 % nasal spray 1-2 sprays  each nostril twice daily as needed 10/18/20   Nehemiah Settle, FNP  blood glucose meter kit and supplies KIT Check blood sugar once daily. One Touch Verio Reflect Meter 06/30/19   Nche, Bonna Gains, NP  Budeson-Glycopyrrol-Formoterol (BREZTRI AEROSPHERE) 160-9-4.8 MCG/ACT AERO Inhale 2 puffs into the lungs daily. Use with spacer. Rinse, gargle and spit out after use. 04/10/23   Ambs, Norvel Richards, FNP  EPINEPHrine (AUVI-Q) 0.3 mg/0.3 mL IJ SOAJ injection Inject 0.3 mg into the muscle as needed for anaphylaxis. 10/18/20   Nehemiah Settle, FNP  estradiol (ESTRACE) 0.1 MG/GM vaginal cream Place 1 Applicatorful vaginally once a week. 05/10/20   [provider]  famotidine (PEPCID) 20 MG tablet Take 1 tablet (20 mg total) by mouth at bedtime. 11/13/22   Alfonse Spruce, MD  fluticasone [facility]) 50 MCG/ACT nasal spray 2 sprays in each nostril once a  day as needed for a stuffy nose 04/10/23   Ambs, Norvel Richards, FNP  glucose blood (ONETOUCH VERIO) test strip Use as instructed 12/13/22   Nche, Bonna Gains, NP  ipratropium (ATROVENT) 0.03 % nasal spray Place 2 sprays into both nostrils 3 (three) times daily. Patient not taking: Reported on 03/18/2023 09/19/21   Alfonse Spruce, MD  Lancets [facility] DELICA PLUS [place]) MISC USE 1  TO CHECK GLUCOSE ONCE DAILY FOR BLOOD SUGAR 12/13/22   Nche, Bonna Gains, NP  losartan (COZAAR) 100 MG tablet Take 1 tablet (100 mg total) by mouth daily. 11/20/22   Nche, Bonna Gains, NP  metFORMIN (GLUCOPHAGE-XR) 500 MG 24 hr tablet Take 1 tablet by mouth once daily with breakfast 11/20/22   Nche, Bonna Gains, NP  Multiple Vitamin (MULTIVITAMIN) capsule Take 1 capsule by mouth daily.    [provider]  pantoprazole (PROTONIX) 40 MG tablet Take 1 tablet (40 mg total) by mouth 2 (two) times daily. 11/13/22   Alfonse Spruce, MD  Semaglutide,0.25 or 0.5MG /DOS, (OZEMPIC, 0.25 OR 0.5 MG/DOSE,) 2 MG/3ML SOPN Inject 0.5 mg into the skin once a week. 02/13/23   Nche, Bonna Gains, NP  triamterene-hydrochlorothiazide (MAXZIDE) 75-50 MG tablet Take 0.5 tablets by mouth daily. 04/23/23   Nche, Bonna Gains, NP      Allergies    Methylprednisolone acetate, Cortisone, Depo-medrol [methylprednisolone sodium succ], and Aspirin  Review of Systems   Review of Systems  Physical Exam Updated Vital Signs Ht 5' (1.524 m)   Wt 97.1 kg   BMI 41.79 kg/m  Physical Exam Vitals and nursing note reviewed.  Constitutional:      General: She is not in acute distress.    Appearance: She is well-developed. She is not diaphoretic.  HENT:     Head: Normocephalic and atraumatic.  Eyes:     Pupils: Pupils are equal, round, and reactive to light.  Cardiovascular:     Rate and Rhythm: Normal rate and regular rhythm.     Heart sounds: No murmur heard.    No friction rub. No gallop.  Pulmonary:     Effort: Pulmonary  effort is normal.     Breath sounds: No wheezing or rales.  Abdominal:     General: There is no distension.     Palpations: Abdomen is soft.     Tenderness: There is no abdominal tenderness.  Musculoskeletal:        General: No tenderness.     Cervical back: Normal range of motion and neck supple.     Comments: The patient's right middle finger has some erythema and edema.  I am able to range it without issue.  No obvious foreign body.  Skin:    General: Skin is warm and dry.  Neurological:     Mental Status: She is alert and oriented to person, place, and time.  Psychiatric:        Behavior: Behavior normal.     ED Results / Procedures / Treatments   Labs (all labs ordered are listed, but only abnormal results are displayed) Labs Reviewed - No data to display  EKG None  Radiology No results found.  Procedures Procedures    Medications Ordered in ED Medications  dexamethasone (DECADRON) tablet 10 mg (has no administration in time range)    ED Course/ Medical Decision Making/ A&P                                 Medical Decision Making Risk Prescription drug management.   62 yo F with a chief complaints of a bee sting.  Happened a couple days ago.  She actually thinks that it is getting worse.  Not a typical timeline for an insect sting.  Looking at it it seems less likely to be cellulitis but will start on antibiotics just in case.  With worsening also will give a dose of Decadron.  Will have her follow-up with her family doctor in the office.  4:33 AM:  I have discussed the diagnosis/risks/treatment options with the patient and family.  Evaluation and diagnostic testing in the emergency department does not suggest an emergent condition requiring admission or immediate intervention beyond what has been performed at this time.  They will follow up with PCP. We also discussed returning to the ED immediately if new or worsening sx occur. We discussed the sx which are  most concerning (e.g., sudden worsening pain, fever, inability to tolerate by mouth) that necessitate immediate return. Medications administered to the patient during their visit and any new prescriptions provided to the patient are listed below.  Medications given during this visit Medications  dexamethasone (DECADRON) tablet 10 mg (has no administration in time range)     The patient appears reasonably screen and/or stabilized for discharge and I doubt any other medical condition or other [facility] requiring  further screening, evaluation, or treatment in the ED at this time prior to discharge.          Final Clinical Impression(s) / ED Diagnoses Final diagnoses:  Bee sting reaction, accidental or unintentional, initial encounter    Rx / DC Orders ED Discharge Orders          Ordered    cephALEXin (KEFLEX) 500 MG capsule        08/20/23 0431              Melene Plan, DO 08/20/23 [phone]

## 2023-08-20 NOTE — ED Triage Notes (Signed)
Pt states stung by a bee on Sunday, states swelling and itching, woke pt up tonight.

## 2023-08-20 NOTE — Discharge Instructions (Signed)
The medications that work best for this are medicines like ibuprofen or naproxen or antihistamines.  You can actually take both classes of medication at the same time.  Please follow-up with your family doctor in the office.

## 2023-08-21 ENCOUNTER — Telehealth: Payer: Self-pay

## 2023-08-21 NOTE — Telephone Encounter (Signed)
Noted. Dm/cma  

## 2023-08-21 NOTE — Transitions of Care (Post Inpatient/ED Visit) (Signed)
08/21/2023  Name: Mackenzie Weber MRN: [id number] DOB: [birth year]  Today's TOC FU Call Status: Today's TOC FU Call Status:: Successful TOC FU Call Completed TOC FU Call Complete Date: 08/21/23 Patient's Name and Date of Birth confirmed.  Transition Care Management Follow-up Telephone Call Date of Discharge: 08/20/23 Discharge Facility: [facility] Type of Discharge: Emergency Department Reason for ED Visit: Other: How have you been since you were released from the hospital?: Better Any questions or concerns?: No  Items Reviewed: Did you receive and understand the discharge instructions provided?: Yes Medications obtained,verified, and reconciled?: Yes (Medications Reviewed) Any new allergies since your discharge?: No Dietary orders reviewed?: NA  Medications Reviewed Today: Medications Reviewed Today     Reviewed by Trudee Kuster, CMA (Certified Medical Assistant) on 08/21/23 at 1318  Med List Status: <None>   Medication Order Taking? Sig Documenting Provider Last Dose Status Informant  albuterol (VENTOLIN HFA) 108 (90 Base) MCG/ACT inhaler [id number] Yes INHALE 2 PUFFS BY MOUTH EVERY 6 HOURS AS NEEDED FOR WHEEZING FOR SHORTNESS OF BREATH Alfonse Spruce, MD Taking Active   Albuterol-Budesonide [facility]) 90-80 MCG/ACT Sandrea Matte [id number] Yes Inhale 2 puffs into the lungs as needed (maximum 12 puffs/day). Verlee Monte, MD Taking Active   azelastine (ASTELIN) 0.1 % nasal spray [id number] Yes 1-2 sprays  each nostril twice daily as needed Nehemiah Settle, FNP Taking Active Self  blood glucose meter kit and supplies KIT [id number] Yes Check blood sugar once daily. One Touch Verio Reflect Meter Nche, Bonna Gains, NP Taking Active Self  Budeson-Glycopyrrol-Formoterol (BREZTRI AEROSPHERE) 160-9-4.8 MCG/ACT AERO [id number] Yes Inhale 2 puffs into the lungs daily. Use with spacer. Rinse, gargle and spit out after use. Hetty Blend, FNP Taking Active   cephALEXin [facility]) 500  MG capsule [id number] Yes 2 caps po bid x 7 days Melene Plan, DO Taking Active   EPINEPHrine (AUVI-Q) 0.3 mg/0.3 mL IJ SOAJ injection [id number] Yes Inject 0.3 mg into the muscle as needed for anaphylaxis. Nehemiah Settle, FNP Taking Active Self  estradiol (ESTRACE) 0.1 MG/GM vaginal cream [id number] Yes Place 1 Applicatorful vaginally once a week. [provider] Taking Active Self  famotidine (PEPCID) 20 MG tablet [id number] Yes Take 1 tablet (20 mg total) by mouth at bedtime. Alfonse Spruce, MD Taking Active   fluticasone [facility]) 50 MCG/ACT nasal spray [id number] Yes 2 sprays in each nostril once a day as needed for a stuffy nose Ambs, Norvel Richards, FNP Taking Active   glucose blood [facility] VERIO) test strip [id number] Yes Use as instructed Nche, Bonna Gains, NP Taking Active   ipratropium (ATROVENT) 0.03 % nasal spray [id number] Yes Place 2 sprays into both nostrils 3 (three) times daily. Alfonse Spruce, MD Taking Active Self  Lancets [facility] DELICA PLUS [place]) [place] [id number] Yes USE 1  TO CHECK GLUCOSE ONCE DAILY FOR BLOOD SUGAR Nche, Bonna Gains, NP Taking Active   losartan (COZAAR) 100 MG tablet [id number] Yes Take 1 tablet (100 mg total) by mouth daily. Nche, Bonna Gains, NP Taking Active   metFORMIN (GLUCOPHAGE-XR) 500 MG 24 hr tablet [id number] Yes Take 1 tablet by mouth once daily with breakfast Nche, Bonna Gains, NP Taking Active   Multiple Vitamin (MULTIVITAMIN) capsule [id number] Yes Take 1 capsule by mouth daily. [provider] Taking Active Self  pantoprazole (PROTONIX) 40 MG tablet [id number] Yes Take 1 tablet (40 mg total) by mouth 2 (two) times daily. Alfonse Spruce, MD Taking Active   Semaglutide,0.25 or 0.5MG /DOS, (OZEMPIC, 0.25 OR  0.5 MG/DOSE,) 2 MG/3ML SOPN [id number] Yes Inject 0.5 mg into the skin once a week. Anne Ng, NP Taking Active   triamterene-hydrochlorothiazide (MAXZIDE) 75-50 MG tablet [id number] Yes Take 0.5 tablets by  mouth daily. Nche, Bonna Gains, NP Taking Active             Home Care and Equipment/Supplies: Were Home Health Services Ordered?: NA Any new equipment or medical supplies ordered?: NA  Functional Questionnaire:    Follow up appointments reviewed: PCP Follow-up appointment confirmed?: Yes Date of PCP follow-up appointment?: 08/22/23 Follow-up Provider: Alysia Penna, NP [facility]ist [facility] Follow-up appointment confirmed?: NA Do you need transportation to your follow-up appointment?: No Do you understand care options if your condition(s) worsen?: Yes-patient verbalized understanding    Derenda Fennel, RMA

## 2023-08-22 ENCOUNTER — Encounter: Payer: Self-pay | Admitting: Nurse Practitioner

## 2023-08-22 ENCOUNTER — Ambulatory Visit (INDEPENDENT_AMBULATORY_CARE_PROVIDER_SITE_OTHER): Payer: BC Managed Care – PPO | Admitting: Nurse Practitioner

## 2023-08-22 VITALS — BP 107/61 | HR 73 | Temp 98.8°F | Resp 18 | Wt 220.4 lb

## 2023-08-22 DIAGNOSIS — E1165 Type 2 diabetes mellitus with hyperglycemia: Secondary | ICD-10-CM | POA: Diagnosis not present

## 2023-08-22 DIAGNOSIS — E785 Hyperlipidemia, unspecified: Secondary | ICD-10-CM

## 2023-08-22 DIAGNOSIS — K5904 Chronic idiopathic constipation: Secondary | ICD-10-CM | POA: Insufficient documentation

## 2023-08-22 DIAGNOSIS — I10 Essential (primary) hypertension: Secondary | ICD-10-CM

## 2023-08-22 DIAGNOSIS — E1169 Type 2 diabetes mellitus with other specified complication: Secondary | ICD-10-CM | POA: Diagnosis not present

## 2023-08-22 DIAGNOSIS — Z7985 Long-term (current) use of injectable non-insulin antidiabetic drugs: Secondary | ICD-10-CM

## 2023-08-22 DIAGNOSIS — Z7984 Long term (current) use of oral hypoglycemic drugs: Secondary | ICD-10-CM

## 2023-08-22 MED ORDER — LUBIPROSTONE 8 MCG PO CAPS: 8.0000 ug | ORAL_CAPSULE | Freq: Two times a day (BID) | ORAL | 5 refills | Status: DC

## 2023-08-22 MED ORDER — LOSARTAN POTASSIUM 100 MG PO TABS: 100.0000 mg | ORAL_TABLET | Freq: Every day | ORAL | 3 refills | Status: DC

## 2023-08-22 MED ORDER — OZEMPIC (0.25 OR 0.5 MG/DOSE) 2 MG/3ML ~~LOC~~ SOPN: 0.2500 mg | PEN_INJECTOR | SUBCUTANEOUS | 1 refills | Status: DC

## 2023-08-22 MED ORDER — METFORMIN HCL ER 500 MG PO TB24: ORAL_TABLET | ORAL | 3 refills | Status: DC

## 2023-08-22 NOTE — Assessment & Plan Note (Addendum)
Reports mild constipation with ozempic 0.5mg , so she decreased dose to 0.25mg . No neuropathy or nephropathy. Requested DIABETES eye exam report.  Repeat hgbA1c and BMP Maintain ozempic dose at 0.25mg  and metformin 500mg  QD Advised to maintain adequate oral hydration and high fiber diet. F/up in 55month

## 2023-08-22 NOTE — Patient Instructions (Signed)
Go to lab Maintain current med doses 

## 2023-08-22 NOTE — Assessment & Plan Note (Signed)
BP at goal with losartan and maxzide BP Readings from Last 3 Encounters:  08/22/23 107/61  08/20/23 (!) 156/99  03/18/23 138/82    Repeat BMP Maintain med doses

## 2023-08-22 NOTE — Assessment & Plan Note (Addendum)
Due to use of ozempic. No improvement with colace and miralax. Last colonoscopy 2020: normal.Due for repeat 06/2024.  Sent amitiza 8mg  1-2x/day. Encouraged to maintain adequate oral hydration, high fiber diet and daily exercise

## 2023-08-22 NOTE — Progress Notes (Signed)
Established Patient Visit  Patient: Mackenzie Weber   DOB: Nov 27, 1960   62 y.o. Female  MRN: [id number] Visit Date: 08/22/2023  Subjective:    Chief Complaint  Patient presents with   Hospitalization Follow-up    PT is here for F/U bee sting on finger. Patient voiced she is doing well after discharge is currently taking antibiotics. PT also mentioned elevated BP reading while in ED and would to follow up with concerns.    HPI Resolved swelling and redness from bee sting  DM (diabetes mellitus) (HCC) Reports mild constipation with ozempic 0.5mg , so she decreased dose to 0.25mg . No neuropathy or nephropathy. Requested DIABETES eye exam report.  Repeat hgbA1c and BMP Maintain ozempic dose at 0.25mg  and metformin 500mg  QD Advised to maintain adequate oral hydration and high fiber diet. F/up in 67month  Hypertensive disorder BP at goal with losartan and maxzide BP Readings from Last 3 Encounters:  08/22/23 107/61  08/20/23 (!) 156/99  03/18/23 138/82    Repeat BMP Maintain med doses  Chronic idiopathic constipation Due to use of ozempic. No improvement with colace and miralax. Last colonoscopy 2020: normal.Due for repeat 06/2024.  Sent amitiza 8mg  1-2x/day. Encouraged to maintain adequate oral hydration, high fiber diet and daily exercise  Wt Readings from Last 3 Encounters:  08/22/23 220 lb 6.4 oz (100 kg)  08/20/23 214 lb (97.1 kg)  03/18/23 217 lb 8 oz (98.7 kg)    Reviewed medical, surgical, and social history today  Medications: Outpatient Medications Prior to Visit  Medication Sig   albuterol (VENTOLIN HFA) 108 (90 Base) MCG/ACT inhaler INHALE 2 PUFFS BY MOUTH EVERY 6 HOURS AS NEEDED FOR WHEEZING FOR SHORTNESS OF BREATH   Albuterol-Budesonide (AIRSUPRA) 90-80 MCG/ACT AERO Inhale 2 puffs into the lungs as needed (maximum 12 puffs/day).   azelastine (ASTELIN) 0.1 % nasal spray 1-2 sprays  each nostril twice daily as needed   blood glucose meter kit  and supplies KIT Check blood sugar once daily. One Touch Verio Reflect Meter   Budeson-Glycopyrrol-Formoterol (BREZTRI AEROSPHERE) 160-9-4.8 MCG/ACT AERO Inhale 2 puffs into the lungs daily. Use with spacer. Rinse, gargle and spit out after use.   cephALEXin (KEFLEX) 500 MG capsule 2 caps po bid x 7 days   EPINEPHrine (AUVI-Q) 0.3 mg/0.3 mL IJ SOAJ injection Inject 0.3 mg into the muscle as needed for anaphylaxis.   estradiol (ESTRACE) 0.1 MG/GM vaginal cream Place 1 Applicatorful vaginally once a week.   famotidine (PEPCID) 20 MG tablet Take 1 tablet (20 mg total) by mouth at bedtime.   fluticasone (FLONASE) 50 MCG/ACT nasal spray 2 sprays in each nostril once a day as needed for a stuffy nose   glucose blood (ONETOUCH VERIO) test strip Use as instructed   ipratropium (ATROVENT) 0.03 % nasal spray Place 2 sprays into both nostrils 3 (three) times daily.   Lancets (ONETOUCH DELICA PLUS LANCET33G) MISC USE 1  TO CHECK GLUCOSE ONCE DAILY FOR BLOOD SUGAR   Multiple Vitamin (MULTIVITAMIN) capsule Take 1 capsule by mouth daily.   pantoprazole (PROTONIX) 40 MG tablet Take 1 tablet (40 mg total) by mouth 2 (two) times daily.   triamterene-hydrochlorothiazide (MAXZIDE) 75-50 MG tablet Take 0.5 tablets by mouth daily.   [DISCONTINUED] losartan (COZAAR) 100 MG tablet Take 1 tablet (100 mg total) by mouth daily.   [DISCONTINUED] metFORMIN (GLUCOPHAGE-XR) 500 MG 24 hr tablet Take 1 tablet by mouth once daily with breakfast   [  DISCONTINUED] Semaglutide,0.25 or 0.5MG /DOS, (OZEMPIC, 0.25 OR 0.5 MG/DOSE,) 2 MG/3ML SOPN Inject 0.5 mg into the skin once a week.   No facility-administered medications prior to visit.   Reviewed past medical and social history.   ROS per HPI above      Objective:  BP 107/61 (BP Location: Left Arm, Patient Position: Sitting, Cuff Size: Large)   Pulse 73   Temp 98.8 F (37.1 C) (Temporal)   Resp 18   Wt 220 lb 6.4 oz (100 kg)   SpO2 98%   BMI 43.04 kg/m      Physical  Exam Vitals and nursing note reviewed.  Cardiovascular:     Rate and Rhythm: Normal rate and regular rhythm.     Pulses: Normal pulses.     Heart sounds: Normal heart sounds.  Pulmonary:     Effort: Pulmonary effort is normal.     Breath sounds: Normal breath sounds.  Abdominal:     General: Bowel sounds are normal.     Palpations: Abdomen is soft.     Tenderness: There is no abdominal tenderness.  Musculoskeletal:     Right lower leg: Edema present.     Left lower leg: Edema present.  Neurological:     Mental Status: She is alert and oriented to person, place, and time.     No results found for any visits on 08/22/23.    Assessment & Plan:    Problem List Items Addressed This Visit     Chronic idiopathic constipation    Due to use of ozempic. No improvement with colace and miralax. Last colonoscopy 2020: normal.Due for repeat 06/2024.  Sent amitiza 8mg  1-2x/day. Encouraged to maintain adequate oral hydration, high fiber diet and daily exercise      Relevant Medications   lubiprostone (AMITIZA) 8 MCG capsule   DM (diabetes mellitus) (HCC)    Reports mild constipation with ozempic 0.5mg , so she decreased dose to 0.25mg . No neuropathy or nephropathy. Requested DIABETES eye exam report.  Repeat hgbA1c and BMP Maintain ozempic dose at 0.25mg  and metformin 500mg  QD Advised to maintain adequate oral hydration and high fiber diet. F/up in 75month      Relevant Medications   Semaglutide,0.25 or 0.5MG /DOS, (OZEMPIC, 0.25 OR 0.5 MG/DOSE,) 2 MG/3ML SOPN   losartan (COZAAR) 100 MG tablet   metFORMIN (GLUCOPHAGE-XR) 500 MG 24 hr tablet   Other Relevant Orders   Hemoglobin A1c   Renal Function Panel   Hypertensive disorder - Primary    BP at goal with losartan and maxzide BP Readings from Last 3 Encounters:  08/22/23 107/61  08/20/23 (!) 156/99  03/18/23 138/82    Repeat BMP Maintain med doses      Relevant Medications   losartan (COZAAR) 100 MG tablet   Other  Relevant Orders   Renal Function Panel   Other Visit Diagnoses     Hyperlipidemia associated with type 2 diabetes mellitus (HCC)       Relevant Medications   Semaglutide,0.25 or 0.5MG /DOS, (OZEMPIC, 0.25 OR 0.5 MG/DOSE,) 2 MG/3ML SOPN   losartan (COZAAR) 100 MG tablet   metFORMIN (GLUCOPHAGE-XR) 500 MG 24 hr tablet   Other Relevant Orders   Direct LDL      Return in about 3 months (around 11/22/2023) for DM, HTN, hyperlipidemia (fasting).     Alysia Penna, NP

## 2023-08-23 LAB — RENAL FUNCTION PANEL
Albumin: 4.2 g/dL (ref 3.5–5.2)
BUN: 17 mg/dL (ref 6–23)
CO2: 27 meq/L (ref 19–32)
Calcium: 9.9 mg/dL (ref 8.4–10.5)
Chloride: 104 meq/L (ref 96–112)
Creatinine, Ser: 1 mg/dL (ref 0.40–1.20)
GFR: 60.81 mL/min (ref >60.00)
Glucose, Bld: 86 mg/dL (ref 70–99)
Phosphorus: 4.7 mg/dL — ABNORMAL HIGH (ref 2.3–4.6)
Potassium: 3.8 meq/L (ref 3.5–5.1)
Sodium: 139 meq/L (ref 135–145)

## 2023-08-23 LAB — HEMOGLOBIN A1C: Hgb A1c MFr Bld: 6.7 % — ABNORMAL HIGH (ref 4.6–6.5)

## 2023-08-23 LAB — LDL CHOLESTEROL, DIRECT: Direct LDL: 82 mg/dL

## 2023-09-05 ENCOUNTER — Ambulatory Visit: Payer: BC Managed Care – PPO | Admitting: Nurse Practitioner

## 2023-10-14 ENCOUNTER — Ambulatory Visit: Payer: BC Managed Care – PPO | Admitting: Family Medicine

## 2023-10-14 ENCOUNTER — Other Ambulatory Visit: Payer: Self-pay

## 2023-10-14 ENCOUNTER — Encounter: Payer: Self-pay | Admitting: Family Medicine

## 2023-10-14 VITALS — BP 160/90 | HR 72 | Temp 98.2°F | Resp 16 | Ht 58.25 in | Wt 222.3 lb

## 2023-10-14 DIAGNOSIS — J302 Other seasonal allergic rhinitis: Secondary | ICD-10-CM | POA: Diagnosis not present

## 2023-10-14 DIAGNOSIS — K219 Gastro-esophageal reflux disease without esophagitis: Secondary | ICD-10-CM

## 2023-10-14 DIAGNOSIS — J3089 Other allergic rhinitis: Secondary | ICD-10-CM

## 2023-10-14 DIAGNOSIS — J019 Acute sinusitis, unspecified: Secondary | ICD-10-CM | POA: Insufficient documentation

## 2023-10-14 DIAGNOSIS — J4541 Moderate persistent asthma with (acute) exacerbation: Secondary | ICD-10-CM | POA: Insufficient documentation

## 2023-10-14 DIAGNOSIS — B9689 Other specified bacterial agents as the cause of diseases classified elsewhere: Secondary | ICD-10-CM | POA: Insufficient documentation

## 2023-10-14 MED ORDER — ALBUTEROL SULFATE HFA 108 (90 BASE) MCG/ACT IN AERS: 2.0000 | INHALATION_SPRAY | RESPIRATORY_TRACT | 1 refills | Status: AC | PRN

## 2023-10-14 MED ORDER — IPRATROPIUM BROMIDE 0.03 % NA SOLN: 2.0000 | Freq: Three times a day (TID) | NASAL | 5 refills | Status: DC

## 2023-10-14 MED ORDER — FLUTICASONE PROPIONATE 50 MCG/ACT NA SUSP: NASAL | 5 refills | Status: AC

## 2023-10-14 MED ORDER — GUAIFENESIN ER 600 MG PO TB12: ORAL_TABLET | ORAL | 0 refills | Status: DC

## 2023-10-14 MED ORDER — AZELASTINE HCL 0.1 % NA SOLN: NASAL | 1 refills | Status: AC

## 2023-10-14 MED ORDER — AMOXICILLIN-POT CLAVULANATE 875-125 MG PO TABS: 1.0000 | ORAL_TABLET | Freq: Two times a day (BID) | ORAL | 0 refills | Status: AC

## 2023-10-14 MED ORDER — BREZTRI AEROSPHERE 160-9-4.8 MCG/ACT IN AERO: 2.0000 | INHALATION_SPRAY | Freq: Every day | RESPIRATORY_TRACT | 5 refills | Status: AC

## 2023-10-14 NOTE — Patient Instructions (Addendum)
Asthma Continue the steroid taper he received 3 days ago at the urgent care for asthma flare.  Continue to check your blood sugar while taking the steroid taper Continue Breztri 2 puffs twice a day with a spacer to prevent cough or wheeze Continue albuterol 2 puffs once every 4 hours as needed for cough or wheeze You may use albuterol 2 puffs 5 to 15 minutes before activity to decrease cough or wheeze  Acute bacterial sinusitis Begin Augmentin 875 twice a day for 7 days Continue nasal saline rinses followed by Flonase once a day Beginning Mucinex 600 to 1200 mg once a day and increase fluid intake as tolerated to thin mucus  Allergic rhinitis Continue allergen avoidance measures directed toward pollens, pets, mold, cockroach, and dust mite as listed below Continue Allegra 180 mg once a day as needed for runny nose or itch.   Continue azelastine 2 sprays in each nostril twice a day as needed for runny nose Continue Atrovent 2 sprays in each nostril twice a day as needed for runny nose Restart Flonase 2 sprays in each nostril once a day as needed for a stuffy nose.  In the right nostril, point the applicator out toward the right ear. In the left nostril, point the applicator out toward the left ear Consider saline nasal rinses as needed for nasal symptoms. Use this before any medicated nasal sprays for best result  Reflux Continue dietary and lifestyle modifications as listed below Continue to follow the treatment plan as listed by your gastrointestinal specialist  Call the clinic your symptoms worsen or you develop a fever.  Follow up in 2 months or sooner if needed.   Lifestyle Changes for Controlling GERD When you have GERD, stomach acid feels as if it's backing up toward your mouth. Whether or not you take medication to control your GERD, your symptoms can often be improved with lifestyle changes.   Raise Your Head Reflux is more likely to strike when you're lying down flat,  because stomach fluid can flow backward more easily. Raising the head of your bed 4-6 inches can help. To do this: Slide blocks or books under the legs at the head of your bed. Or, place a wedge under the mattress. Many foam stores can make a suitable wedge for you. The wedge should run from your waist to the top of your head. Don't just prop your head on several pillows. This increases pressure on your stomach. It can make GERD worse.  Watch Your Eating Habits Certain foods may increase the acid in your stomach or relax the lower esophageal sphincter, making GERD more likely. It's best to avoid the following: Coffee, tea, and carbonated drinks (with and without caffeine) Fatty, fried, or spicy food Mint, chocolate, onions, and tomatoes Any other foods that seem to irritate your stomach or cause you pain  Relieve the Pressure Eat smaller meals, even if you have to eat more often. Don't lie down right after you eat. Wait a few hours for your stomach to empty. Avoid tight belts and tight-fitting clothes. Lose excess weight.  Tobacco and Alcohol Avoid smoking tobacco and drinking alcohol. They can make GERD symptoms worse.  Reducing Pollen Exposure The American Academy of Allergy, Asthma and Immunology suggests the following steps to reduce your exposure to pollen during allergy seasons. Do not hang sheets or clothing out to dry; pollen may collect on these items. Do not mow lawns or spend time around freshly cut grass; mowing stirs up pollen. Keep  windows closed at night.  Keep car windows closed while driving. Minimize morning activities outdoors, a time when pollen counts are usually at their highest. Stay indoors as much as possible when pollen counts or humidity is high and on windy days when pollen tends to remain in the air longer. Use air conditioning when possible.  Many air conditioners have filters that trap the pollen spores. Use a HEPA room air filter to remove pollen form  the indoor air you breathe.  Control of Dog or Cat Allergen Avoidance is the best way to manage a dog or cat allergy. If you have a dog or cat and are allergic to dog or cats, consider removing the dog or cat from the home. If you have a dog or cat but don't want to find it a new home, or if your family wants a pet even though someone in the household is allergic, here are some strategies that may help keep symptoms at bay:  Keep the pet out of your bedroom and restrict it to only a few rooms. Be advised that keeping the dog or cat in only one room will not limit the allergens to that room. Don't pet, hug or kiss the dog or cat; if you do, wash your hands with soap and water. High-efficiency particulate air (HEPA) cleaners run continuously in a bedroom or living room can reduce allergen levels over time. Regular use of a high-efficiency vacuum cleaner or a central vacuum can reduce allergen levels. Giving your dog or cat a bath at least once a week can reduce airborne allergen.  Control of Mold Allergen Mold and fungi can grow on a variety of surfaces provided certain temperature and moisture conditions exist.  Outdoor molds grow on plants, decaying vegetation and soil.  The major outdoor mold, Alternaria and Cladosporium, are found in very high numbers during hot and dry conditions.  Generally, a late Summer - Fall peak is seen for common outdoor fungal spores.  Rain will temporarily lower outdoor mold spore count, but counts rise rapidly when the rainy period ends.  The most important indoor molds are Aspergillus and Penicillium.  Dark, humid and poorly ventilated basements are ideal sites for mold growth.  The next most common sites of mold growth are the bathroom and the kitchen.  Outdoor [employer] Use air conditioning and keep windows closed Avoid exposure to decaying vegetation. Avoid leaf raking. Avoid grain handling. Consider wearing a face mask if working in moldy areas.  Indoor  Mold Control Maintain humidity below 50%. Clean washable surfaces with 5% bleach solution. Remove sources e.g. Contaminated carpets.   Control of Dust Mite Allergen Dust mites play a major role in allergic asthma and rhinitis. They occur in environments with high humidity wherever human skin is found. Dust mites absorb humidity from the atmosphere (ie, they do not drink) and feed on organic matter (including shed human and animal skin). Dust mites are a microscopic type of insect that you cannot see with the naked eye. High levels of dust mites have been detected from mattresses, pillows, carpets, upholstered furniture, bed covers, clothes, soft toys and any woven material. The principal allergen of the dust mite is found in its feces. A gram of dust may contain 1,000 mites and 250,000 fecal particles. Mite antigen is easily measured in the air during house cleaning activities. Dust mites do not bite and do not cause harm to humans, other than by triggering allergies/asthma.  Ways to decrease your exposure to dust  mites in your home:  1. Encase mattresses, box springs and pillows with a mite-impermeable barrier or cover  2. Wash sheets, blankets and drapes weekly in hot water (130 F) with detergent and dry them in a dryer on the hot setting.  3. Have the room cleaned frequently with a vacuum cleaner and a damp dust-mop. For carpeting or rugs, vacuuming with a vacuum cleaner equipped with a high-efficiency particulate air (HEPA) filter. The dust mite allergic individual should not be in a room which is being cleaned and should wait 1 hour after cleaning before going into the room.  4. Do not sleep on upholstered furniture (eg, couches).  5. If possible removing carpeting, upholstered furniture and drapery from the home is ideal. Horizontal blinds should be eliminated in the rooms where the person spends the most time (bedroom, study, television room). Washable vinyl, roller-type shades are  optimal.  6. Remove all non-washable stuffed toys from the bedroom. Wash stuffed toys weekly like sheets and blankets above.  7. Reduce indoor humidity to less than 50%. Inexpensive humidity monitors can be purchased at most hardware stores. Do not use a humidifier as can make the problem worse and are not recommended.  Control of Cockroach Allergen Cockroach allergen has been identified as an important cause of acute attacks of asthma, especially in urban settings.  There are fifty-five species of cockroach that exist in the [place], however only three, the [place], [place] species produce allergen that can affect patients with Asthma.  Allergens can be obtained from fecal particles, egg casings and secretions from cockroaches.    Remove food sources. Reduce access to water. Seal access and entry points. Spray runways with 0.5-1% Diazinon or Chlorpyrifos Blow boric acid power under stoves and refrigerator. Place bait stations (hydramethylnon) at feeding sites.

## 2023-10-14 NOTE — Progress Notes (Signed)
[address] [place] [address] Dept: [phone]  FOLLOW UP NOTE  Patient ID: Mackenzie Weber, female    DOB: 1961-04-07  Age: 62 y.o. MRN: [id number] Date of Office Visit: 10/14/2023  Assessment  Chief Complaint: Nasal Congestion (Since last Monday, has been having a lot of congestion and coughing up yellow/green mucus. Has had a headache, coughing, and ears popping. Was given a predisone pack and has been taking robitussin, but is worried it might cause her asthma to flare.)  HPI Mackenzie Weber is a 62 year old female who presents to the clinic for evaluation of cough.  She was last seen in this clinic on 03/18/2023 by Thermon Leyland, FNP, for evaluation of asthma, allergic rhinitis, and reflux for which she follows her GI specialist.  Chart review indicates that she visited the emergency department on 10/11/2023 for asthma exacerbation where she received Tussionex Pennkinetic as well as a prednisone taper.    At today's visit, she reports her asthma has been moderately well-controlled with shortness of breath with activity including climbing the stairs and cough producing thick yellow to green mucus occurring in the daytime and nighttime. She reports these symptoms began last Monday.  She continues Breztri 2 puffs twice a day with a spacer and has been using albuterol about 2 to 3 days a week with relief of symptoms.    Allergic rhinitis is reported as poorly controlled with symptoms including nasal congestion, thick green nasal drainage, and copious postnasal drainage.  She reports intermittent headache around both eyes that began about 1 week ago.  She continues Allegra 180 mg once a day, azelastine daily, and saline nasal rinses as needed.  She reports that she has been out of Flonase for several weeks.  Prior to 1 week ago, she reports her allergic rhinitis has been well-controlled.  She reports that she has taken 1 antibiotic this year for acute sinusitis with relief of symptoms.  Her  last environmental allergy skin testing was on 05/30/2020 and was positive to grass pollen, tree pollen, weed pollen, ragweed pollen, mold, dust mite, cat, dog, and cockroach.  Reflux is reported as well-controlled with no symptoms including heartburn or vomiting.  She is not currently taking a medication to control reflux.  She continues to follow-up with her GI specialist as recommended.  Her current medications are listed in the chart.  Drug Allergies:  Allergies  Allergen Reactions   Methylprednisolone Acetate Other (See Comments) and Rash   Cortisone     Red area around injection site x   Depo-Medrol [Methylprednisolone Sodium Succ] Nausea Only and Other (See Comments)    Dizziness   Aspirin Anxiety and Other (See Comments)    Rapid Heart beat    Physical Exam: BP (!) 160/90 (BP Location: Right Arm, Patient Position: Sitting, Cuff Size: Large)   Pulse 72   Temp 98.2 F (36.8 C) (Temporal)   Resp 16   Ht 4' 10.25" (1.48 m)   Wt 222 lb 4.8 oz (100.8 kg)   SpO2 98%   BMI 46.06 kg/m    Physical Exam Vitals reviewed.  Constitutional:      Appearance: Normal appearance.  HENT:     Head: Normocephalic and atraumatic.     Right Ear: Tympanic membrane normal.     Left Ear: Tympanic membrane normal.     Nose:     Comments: Bilateral nares erythematous with thick yellow nasal drainage noted.  Pharynx slightly erythematous with no exudate.  Ears normal.  Eyes normal.    Mouth/Throat:     Pharynx: Oropharynx is clear.  Eyes:     Conjunctiva/sclera: Conjunctivae normal.  Cardiovascular:     Rate and Rhythm: Normal rate and regular rhythm.     Heart sounds: Normal heart sounds.  Pulmonary:     Effort: Pulmonary effort is normal.     Breath sounds: Normal breath sounds.     Comments: Lungs clear to auscultation.  Patient with frequent coughing producing thick yellow/green drainage Musculoskeletal:        General: Normal range of motion.     Cervical back: Normal range of  motion and neck supple.  Skin:    General: Skin is warm and dry.  Neurological:     Mental Status: She is alert and oriented to person, place, and time.  Psychiatric:        Mood and Affect: Mood normal.        Behavior: Behavior normal.        Thought Content: Thought content normal.        Judgment: Judgment normal.     Diagnostics: Spirometry deferred due to recent cough  Assessment and Plan: 1. Moderate persistent asthma with acute exacerbation   2. Acute bacterial sinusitis   3. Seasonal and perennial allergic rhinitis   4. Gastroesophageal reflux disease without esophagitis     Meds ordered this encounter  Medications   albuterol (VENTOLIN HFA) 108 (90 Base) MCG/ACT inhaler    Sig: Inhale 2 puffs into the lungs every 4 (four) hours as needed for wheezing or shortness of breath.    Dispense:  18 g    Refill:  1   Budeson-Glycopyrrol-Formoterol (BREZTRI AEROSPHERE) 160-9-4.8 MCG/ACT AERO    Sig: Inhale 2 puffs into the lungs daily. Use with spacer. Rinse, gargle and spit out after use.    Dispense:  10.7 g    Refill:  5    Please hold at the pharmacy until patient requests it.   amoxicillin-clavulanate (AUGMENTIN) 875-125 MG tablet    Sig: Take 1 tablet by mouth 2 (two) times daily for 7 days.    Dispense:  14 tablet    Refill:  0   guaiFENesin (MUCINEX) 600 MG 12 hr tablet    Sig: 600 to 1200 mg once a day and increase fluid intake as tolerated to thin mucus.    Dispense:  28 tablet    Refill:  0   azelastine (ASTELIN) 0.1 % nasal spray    Sig: 1-2 sprays  each nostril twice daily as needed    Dispense:  90 mL    Refill:  1    Please dispense 90 day supply.   ipratropium (ATROVENT) 0.03 % nasal spray    Sig: Place 2 sprays into both nostrils 3 (three) times daily.    Dispense:  30 mL    Refill:  5   fluticasone (FLONASE) 50 MCG/ACT nasal spray    Sig: 2 sprays in each nostril once a day as needed for a stuffy nose    Dispense:  16 g    Refill:  5     Patient Instructions  Asthma Continue the steroid taper he received 3 days ago at the urgent care for asthma flare.  Continue to check your blood sugar while taking the steroid taper Continue Breztri 2 puffs twice a day with a spacer to prevent cough or wheeze Continue albuterol 2 puffs once every 4 hours as needed for cough or wheeze You may  use albuterol 2 puffs 5 to 15 minutes before activity to decrease cough or wheeze  Acute bacterial sinusitis Begin Augmentin 875 twice a day for 7 days Continue nasal saline rinses followed by Flonase once a day Beginning Mucinex 600 to 1200 mg once a day and increase fluid intake as tolerated to thin mucus  Allergic rhinitis Continue allergen avoidance measures directed toward pollens, pets, mold, cockroach, and dust mite as listed below Continue Allegra 180 mg once a day as needed for runny nose or itch.   Continue azelastine 2 sprays in each nostril twice a day as needed for runny nose Continue Atrovent 2 sprays in each nostril twice a day as needed for runny nose Restart Flonase 2 sprays in each nostril once a day as needed for a stuffy nose.  In the right nostril, point the applicator out toward the right ear. In the left nostril, point the applicator out toward the left ear Consider saline nasal rinses as needed for nasal symptoms. Use this before any medicated nasal sprays for best result  Reflux Continue dietary and lifestyle modifications as listed below Continue to follow the treatment plan as listed by your gastrointestinal specialist  Call the clinic your symptoms worsen or you develop a fever.  Follow up in 2 months or sooner if needed.   Return in about 2 months (around 12/13/2023), or if symptoms worsen or fail to improve.    Thank you for the opportunity to care for this patient.  Please do not hesitate to contact me with questions.  Thermon Leyland, FNP [facility]

## 2023-10-24 ENCOUNTER — Telehealth: Payer: Self-pay

## 2023-10-24 NOTE — Telephone Encounter (Signed)
 I called patient and informed of Anne's message.

## 2023-10-24 NOTE — Telephone Encounter (Addendum)
 Patient called stating she is still experiencing a lot of nasal congestion and hoarseness. There is no color to the mucous, just very thick.  Patient states she has completed all of the mucinex  and doing the daily medications Anne placed her on.   [facility] [address]  Please advise

## 2023-10-24 NOTE — Telephone Encounter (Signed)
 Please have her stop the antihistamine and continue aggressive nasal saline rinses and Mucinex until the mucus thins out. Increase water intake as tolerated. Please have her call the clinic for a fever or discolored drainage. Thank you

## 2023-10-30 ENCOUNTER — Telehealth: Payer: Self-pay | Admitting: Family Medicine

## 2023-10-30 NOTE — Telephone Encounter (Signed)
 Patient called and stated she followed the instructions Rexene Catching gave her on 10/24/2023 for her cough and sickness. Patient said that she has been taking mucinex  and drinking plenty of water with no relief. She said she is not feeling any better and is feeling bad and is having bad headaches. Patient stated that she did not take a covid test. Patient is requesting a call back or medicine called in to pharmacy. Patients pharmacy is [facility] in [place] on [address] Patients call back number is [phone]

## 2023-10-31 NOTE — Telephone Encounter (Signed)
Pt took some tylenol sinus and it has helped her more then anything, she hasn't felt this good since before christmas. She will test to covid and let us know.

## 2023-11-01 NOTE — Telephone Encounter (Signed)
Patient reports that she is feeling better now. She continues her current regimen. She is no longer experiencing green nasal drainage after taking the antibiotic. She continues to experience some post nasal drainage and sone voice loss. She ill start nasal saline rinses and Flonase.

## 2023-11-11 ENCOUNTER — Telehealth: Payer: Self-pay | Admitting: Family Medicine

## 2023-11-11 NOTE — Telephone Encounter (Signed)
Patient called and stated that she is feeling the bad again. Back to the way she was feeling on Christmas week. She has cough, drainage. Hoarsness in her voice. Patient stated that Thermon Leyland told her to call back if she doesn't get better. Patient said that she was getting better but has now taken a turn for the worse. Patient is requesting a call back because she is not sure what to do. Patients call back number is [phone]

## 2023-11-11 NOTE — Telephone Encounter (Signed)
Can you please call this patient and have her schedule an appointment in the clinic? Thank you

## 2023-11-11 NOTE — Telephone Encounter (Signed)
Called patient - DOB/DPR verified - advised of provider notation below,  Scheduled f/u appt.: 11/12/23 @ 10:30 am w/Chrissie.  Patient verbalized understanding, no further questions.

## 2023-11-12 ENCOUNTER — Other Ambulatory Visit: Payer: Self-pay | Admitting: Obstetrics and Gynecology

## 2023-11-12 ENCOUNTER — Other Ambulatory Visit: Payer: Self-pay

## 2023-11-12 ENCOUNTER — Ambulatory Visit: Payer: Self-pay | Admitting: Family

## 2023-11-12 ENCOUNTER — Encounter: Payer: Self-pay | Admitting: Family

## 2023-11-12 ENCOUNTER — Ambulatory Visit
Admission: RE | Admit: 2023-11-12 | Discharge: 2023-11-12 | Disposition: A | Discharge status: Home / Self Care | Payer: 59 | Source: Ambulatory Visit | Attending: Family | Admitting: Family

## 2023-11-12 VITALS — BP 122/70 | HR 78 | Temp 97.9°F | Resp 20 | Ht <= 58 in | Wt 222.9 lb

## 2023-11-12 DIAGNOSIS — R058 Other specified cough: Secondary | ICD-10-CM

## 2023-11-12 DIAGNOSIS — J4541 Moderate persistent asthma with (acute) exacerbation: Secondary | ICD-10-CM

## 2023-11-12 DIAGNOSIS — K219 Gastro-esophageal reflux disease without esophagitis: Secondary | ICD-10-CM | POA: Diagnosis not present

## 2023-11-12 DIAGNOSIS — Z1231 Encounter for screening mammogram for malignant neoplasm of breast: Secondary | ICD-10-CM

## 2023-11-12 DIAGNOSIS — J3089 Other allergic rhinitis: Secondary | ICD-10-CM | POA: Diagnosis not present

## 2023-11-12 DIAGNOSIS — J302 Other seasonal allergic rhinitis: Secondary | ICD-10-CM

## 2023-11-12 MED ORDER — PREDNISONE 10 MG PO TABS: ORAL_TABLET | ORAL | 0 refills | Status: DC

## 2023-11-12 NOTE — Progress Notes (Signed)
Please let  Mackenzie Weber know that her chest xray does not show any infection. 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 Please send a copy of her chest x-ray to her primary care physician.

## 2023-11-12 NOTE — Patient Instructions (Addendum)
Asthma with acute exacerbation Will get STAT chest x-ray due to productive cough. We will call you with results Start prednisone 10 mg taking 1 tablet twice a day for 5 days. Reviewed side effects of steroids. Please check blood sugar while taking prednisone. Recommend getting lab work at next office visit to see if she qualifies for an asthma biologic. Last round of steroids on 10/20/23 or 10/22/23. Continue Breztri 2 puffs twice a day with a spacer to prevent cough or wheeze Stop using AirSupra daily as a preventative. Use Airsupra 2 puffs inhaled as needed for cough, wheeze, tightness in chest, or shortness of breath. Do not exceed 12 puffs in 24 hours  Allergic rhinitis Will refer back to [facility] due to sinusitis symptoms, hoarseness of voice, and recent antibiotics and steroids with continued symptoms. Discussed with Boyd Kerbs how they would be able to use a flexible scope to look at her sinus region/vocal cords and sinus imaging -Recommend stopping Tylenol Sinus Severe Continue allergen avoidance measures directed toward pollens, pets, mold, cockroach, and dust mite as listed below Continue Allegra 180 mg once a day as needed for runny nose or itch.   Continue azelastine 2 sprays in each nostril twice a day as needed for runny nose Continue Atrovent 2 sprays in each nostril twice a day as needed for runny nose Continue Flonase 2 sprays in each nostril once a day as needed for a stuffy nose.  In the right nostril, point the applicator out toward the right ear. In the left nostril, point the applicator out toward the left ear Consider saline nasal rinses as needed for nasal symptoms. Use this before any medicated nasal sprays for best result  Reflux Continue dietary and lifestyle modifications as listed below Continue to follow the treatment plan as listed by your gastrointestinal specialist  Call the clinic your symptoms worsen or you develop a fever.  Follow up in 6-8 weeks with Dr.  Dellis Anes or sooner if needed.   Lifestyle Changes for Controlling GERD When you have GERD, stomach acid feels as if it's backing up toward your mouth. Whether or not you take medication to control your GERD, your symptoms can often be improved with lifestyle changes.   Raise Your Head Reflux is more likely to strike when you're lying down flat, because stomach fluid can flow backward more easily. Raising the head of your bed 4-6 inches can help. To do this: Slide blocks or books under the legs at the head of your bed. Or, place a wedge under the mattress. Many foam stores can make a suitable wedge for you. The wedge should run from your waist to the top of your head. Don't just prop your head on several pillows. This increases pressure on your stomach. It can make GERD worse.  Watch Your Eating Habits Certain foods may increase the acid in your stomach or relax the lower esophageal sphincter, making GERD more likely. It's best to avoid the following: Coffee, tea, and carbonated drinks (with and without caffeine) Fatty, fried, or spicy food Mint, chocolate, onions, and tomatoes Any other foods that seem to irritate your stomach or cause you pain  Relieve the Pressure Eat smaller meals, even if you have to eat more often. Don't lie down right after you eat. Wait a few hours for your stomach to empty. Avoid tight belts and tight-fitting clothes. Lose excess weight.  Tobacco and Alcohol Avoid smoking tobacco and drinking alcohol. They can make GERD symptoms worse.  Reducing Pollen  Exposure The American Academy of Allergy, Asthma and Immunology suggests the following steps to reduce your exposure to pollen during allergy seasons. Do not hang sheets or clothing out to dry; pollen may collect on these items. Do not mow lawns or spend time around freshly cut grass; mowing stirs up pollen. Keep windows closed at night.  Keep car windows closed while driving. Minimize morning activities  outdoors, a time when pollen counts are usually at their highest. Stay indoors as much as possible when pollen counts or humidity is high and on windy days when pollen tends to remain in the air longer. Use air conditioning when possible.  Many air conditioners have filters that trap the pollen spores. Use a HEPA room air filter to remove pollen form the indoor air you breathe.  Control of Dog or Cat Allergen Avoidance is the best way to manage a dog or cat allergy. If you have a dog or cat and are allergic to dog or cats, consider removing the dog or cat from the home. If you have a dog or cat but don't want to find it a new home, or if your family wants a pet even though someone in the household is allergic, here are some strategies that may help keep symptoms at bay:  Keep the pet out of your bedroom and restrict it to only a few rooms. Be advised that keeping the dog or cat in only one room will not limit the allergens to that room. Don't pet, hug or kiss the dog or cat; if you do, wash your hands with soap and water. High-efficiency particulate air (HEPA) cleaners run continuously in a bedroom or living room can reduce allergen levels over time. Regular use of a high-efficiency vacuum cleaner or a central vacuum can reduce allergen levels. Giving your dog or cat a bath at least once a week can reduce airborne allergen.  Control of Mold Allergen Mold and fungi can grow on a variety of surfaces provided certain temperature and moisture conditions exist.  Outdoor molds grow on plants, decaying vegetation and soil.  The major outdoor mold, Alternaria and Cladosporium, are found in very high numbers during hot and dry conditions.  Generally, a late Summer - Fall peak is seen for common outdoor fungal spores.  Rain will temporarily lower outdoor mold spore count, but counts rise rapidly when the rainy period ends.  The most important indoor molds are Aspergillus and Penicillium.  Dark, humid and  poorly ventilated basements are ideal sites for mold growth.  The next most common sites of mold growth are the bathroom and the kitchen.  Outdoor [employer] Use air conditioning and keep windows closed Avoid exposure to decaying vegetation. Avoid leaf raking. Avoid grain handling. Consider wearing a face mask if working in moldy areas.  Indoor Mold Control Maintain humidity below 50%. Clean washable surfaces with 5% bleach solution. Remove sources e.g. Contaminated carpets.   Control of Dust Mite Allergen Dust mites play a major role in allergic asthma and rhinitis. They occur in environments with high humidity wherever human skin is found. Dust mites absorb humidity from the atmosphere (ie, they do not drink) and feed on organic matter (including shed human and animal skin). Dust mites are a microscopic type of insect that you cannot see with the naked eye. High levels of dust mites have been detected from mattresses, pillows, carpets, upholstered furniture, bed covers, clothes, soft toys and any woven material. The principal allergen of the dust mite is  found in its feces. A gram of dust may contain 1,000 mites and 250,000 fecal particles. Mite antigen is easily measured in the air during house cleaning activities. Dust mites do not bite and do not cause harm to humans, other than by triggering allergies/asthma.  Ways to decrease your exposure to dust mites in your home:  1. Encase mattresses, box springs and pillows with a mite-impermeable barrier or cover  2. Wash sheets, blankets and drapes weekly in hot water (130 F) with detergent and dry them in a dryer on the hot setting.  3. Have the room cleaned frequently with a vacuum cleaner and a damp dust-mop. For carpeting or rugs, vacuuming with a vacuum cleaner equipped with a high-efficiency particulate air (HEPA) filter. The dust mite allergic individual should not be in a room which is being cleaned and should wait 1 hour after  cleaning before going into the room.  4. Do not sleep on upholstered furniture (eg, couches).  5. If possible removing carpeting, upholstered furniture and drapery from the home is ideal. Horizontal blinds should be eliminated in the rooms where the person spends the most time (bedroom, study, television room). Washable vinyl, roller-type shades are optimal.  6. Remove all non-washable stuffed toys from the bedroom. Wash stuffed toys weekly like sheets and blankets above.  7. Reduce indoor humidity to less than 50%. Inexpensive humidity monitors can be purchased at most hardware stores. Do not use a humidifier as can make the problem worse and are not recommended.  Control of Cockroach Allergen Cockroach allergen has been identified as an important cause of acute attacks of asthma, especially in urban settings.  There are fifty-five species of cockroach that exist in the [place], however only three, the [place], [place] species produce allergen that can affect patients with Asthma.  Allergens can be obtained from fecal particles, egg casings and secretions from cockroaches.    Remove food sources. Reduce access to water. Seal access and entry points. Spray runways with 0.5-1% Diazinon or Chlorpyrifos Blow boric acid power under stoves and refrigerator. Place bait stations (hydramethylnon) at feeding sites.

## 2023-11-12 NOTE — Progress Notes (Signed)
The next step is as what we discussed in the office.   Take the prednisone as prescribed at today's office visit along with her Breztri 2 puffs twice a day with spacer.  Use AirSupra as needed rather than scheduled. When she follows up with Dr. Dellis Anes she will get lab work to see if she qualifies for an asthma biologic drug to help with her cough. We could not do that lab work today due to her recent steroids within the past 4 weeks.   From an allergy standpoint we will try to use her nose sprays and saline rinses more consistently. I do not feel like she needs an antibiotic at this time. We will also refer to ENT due to her frequent sinus symptoms where they are capable of looking at her vocal cords due to hoarseness and do sinus imaging. This could also be a cause of her cough.   She is already on reflux medicine.

## 2023-11-12 NOTE — Progress Notes (Signed)
[address] [place] [address] Dept: [phone]  FOLLOW UP NOTE  Patient ID: Mackenzie Weber, female    DOB: 05-27-1961  Age: 63 y.o. MRN: [id number] Date of Office Visit: 11/12/2023  Assessment  Chief Complaint: Cough (Reoccurring cough since before Christmas. Has had antibiotics/steroids. Mucous is yellowish. )  HPI Mackenzie Weber is a 63 year old female who presents today for an acute visit of cough.  She was last seen on October 14, 2023 by Thermon Leyland, FNP for moderate persistent asthma with acute exacerbation, acute bacterial sinusitis, seasonal and perennial allergic rhinitis, and gastroesophageal reflux disease.  She denies any new diagnosis or surgery since her last office visit.  Christmas she was sick that Monday.  She was in someone's house that she figures something triggered her allergies.  At that time she had a sore throat.  That Thursday or Friday she went to urgent care and was given prednisone pack and a cough medicine.  She did not feel any better.  She came to our office the next week and was given an antibiotic.  During that time she was using her inhalers and nose spray and having congestion in her nose and headache.  Her husband had her try Tylenol Sinus Severe and this helped some with the headache.  At that point in time she was not 100% better but 80 to 90% better.  Then this last Thursday or Friday she started getting her symptoms again of coughing and chest tightness. She did do a Covid-19 test last Tuesday, Wednesday or Thursday due to someone in her office being positive. She reports that hers was negative.  Asthma: She reports cough with thick yellow sputum since Sunday. Prior to that her cough was nonproductive. She also reports tightness in her chest and a lot of shortness of breath when walking up stairs. She denies wheezing, fever, chills, body aches and nocturnal awakenings due to breathing problems. She is able to sleep at night because of left over  cough syrup she has. She continues to take Breztri 2 puffs twice a day and uses AirSupra first thing every morning even if she is not having symptoms. She is not sure if Paulene Floor helps with her symptoms because she has not tried. She is also not certain if prednisone has helped with her symptoms also. She has not made any trips to the emergency room or urgent care due to breathing problems. She has not been on any other steroids. She thinks that she finished her last round of steroids on January 5th or 7th. She feels like the cough is coming from her chest. She reports that she can tolerate prednisone.  Allergic rhinitis: She has previously been on allergy injections for years. She reports that she was on allergy injections in 1997 with her ENT. She also did allergy injections with our practice from 06/27/20 through 10/16/21. She reports this Sunday she started having allergy symptoms again. She has clear rhinorrhea, little post nasal drip, nasal congestion but not as bad as before and some sinus pressure. She denies a sore throat. She is currently taking Tylenol Sinus Severe and stopped taking Allegra 180 mg. She is not certain of what nose sprays she is using. She is using azelastine nasal spray on a Q-tip because it "messes with her throat". She is not certain if she is using ipratropium bromide nasal spray, but then mentions later on in the visit that she stopped using this nasal spray when her symptoms got better.  She uses fluticasone nasal spray as needed and saline rinses as needed.  She has not been treated for any other sinus infections since we last saw her.  Gastroesophageal reflux disease: She reports a little reflux symptoms,but not a lot since taking Protonix daily and some times she will take Pepcid also.     Drug Allergies:  Allergies  Allergen Reactions   Methylprednisolone Acetate Other (See Comments) and Rash   Cortisone     Red area around injection site x   Depo-Medrol  [Methylprednisolone Sodium Succ] Nausea Only and Other (See Comments)    Dizziness   Aspirin Anxiety and Other (See Comments)    Rapid Heart beat    Review of Systems: Negative except as per HPI   Physical Exam: BP 122/70 (BP Location: Left Arm, Patient Position: Sitting, Cuff Size: Large)   Pulse 78   Temp 97.9 F (36.6 C) (Temporal)   Resp 20   Ht 4\' 10"  (1.473 m)   Wt 222 lb 14.4 oz (101.1 kg)   SpO2 98%   BMI 46.59 kg/m    Physical Exam Constitutional:      Appearance: Normal appearance.  HENT:     Head: Normocephalic and atraumatic.     Comments: Pharynx normal. Eyes normal. Ears normal. Nose: bilateral lower turbinates moderately edematous and pale with clear drainage noted.    Right Ear: Tympanic membrane, ear canal and external ear normal.     Left Ear: Tympanic membrane, ear canal and external ear normal.     Mouth/Throat:     Mouth: Mucous membranes are moist.     Pharynx: Oropharynx is clear.  Eyes:     Conjunctiva/sclera: Conjunctivae normal.  Cardiovascular:     Rate and Rhythm: Regular rhythm.     Heart sounds: Normal heart sounds.  Pulmonary:     Effort: Pulmonary effort is normal.     Breath sounds: Normal breath sounds.     Comments: Lungs clear to auscultation Musculoskeletal:     Cervical back: Neck supple.  Skin:    General: Skin is warm.  Neurological:     Mental Status: She is alert and oriented to person, place, and time.  Psychiatric:        Mood and Affect: Mood normal.        Behavior: Behavior normal.        Thought Content: Thought content normal.        Judgment: Judgment normal.     Diagnostics:  FVC 1.36 L (63%), FEV1 1.04 L ( 60%), FEV1/FVC 0.76. Spirometry indicates possible restrictive defect. 4 puffs of Xopenex given. FVV 1.42 L ( 66%), FEV1 1.08 L (63%). There is a 3% change in FEV1. Spirometry indicates possible restrictive defect.  Assessment and Plan: 1. Productive cough   2. Moderate persistent asthma with acute  exacerbation   3. Seasonal and perennial allergic rhinitis   4. Gastroesophageal reflux disease, unspecified whether esophagitis present     Meds ordered this encounter  Medications   predniSONE (DELTASONE) 10 MG tablet    Sig: Take 1 tablet twice a day for 5 days and then stop    Dispense:  10 tablet    Refill:  0    Patient Instructions  Asthma with acute exacerbation Will get STAT chest x-ray due to productive cough. We will call you with results Start prednisone 10 mg taking 1 tablet twice a day for 5 days. Reviewed side effects of steroids. Please check blood sugar while taking  prednisone. Recommend getting lab work at next office visit to see if she qualifies for an asthma biologic. Last round of steroids on 10/20/23 or 10/22/23. Continue Breztri 2 puffs twice a day with a spacer to prevent cough or wheeze Stop using AirSupra daily as a preventative. Use Airsupra 2 puffs inhaled as needed for cough, wheeze, tightness in chest, or shortness of breath. Do not exceed 12 puffs in 24 hours  Allergic rhinitis Will refer back to [facility] due to sinusitis symptoms, hoarseness of voice, and recent antibiotics and steroids with continued symptoms. Discussed with Boyd Kerbs how they would be able to use a flexible scope to look at her sinus region/vocal cords and sinus imaging -Recommend stopping Tylenol Sinus Severe Continue allergen avoidance measures directed toward pollens, pets, mold, cockroach, and dust mite as listed below Continue Allegra 180 mg once a day as needed for runny nose or itch.   Continue azelastine 2 sprays in each nostril twice a day as needed for runny nose Continue Atrovent 2 sprays in each nostril twice a day as needed for runny nose Continue Flonase 2 sprays in each nostril once a day as needed for a stuffy nose.  In the right nostril, point the applicator out toward the right ear. In the left nostril, point the applicator out toward the left ear Consider saline nasal  rinses as needed for nasal symptoms. Use this before any medicated nasal sprays for best result  Reflux Continue dietary and lifestyle modifications as listed below Continue to follow the treatment plan as listed by your gastrointestinal specialist  Call the clinic your symptoms worsen or you develop a fever.  Follow up in 6-8 weeks with Dr. Dellis Anes or sooner if needed.   Lifestyle Changes for Controlling GERD When you have GERD, stomach acid feels as if it's backing up toward your mouth. Whether or not you take medication to control your GERD, your symptoms can often be improved with lifestyle changes.   Raise Your Head Reflux is more likely to strike when you're lying down flat, because stomach fluid can flow backward more easily. Raising the head of your bed 4-6 inches can help. To do this: Slide blocks or books under the legs at the head of your bed. Or, place a wedge under the mattress. Many foam stores can make a suitable wedge for you. The wedge should run from your waist to the top of your head. Don't just prop your head on several pillows. This increases pressure on your stomach. It can make GERD worse.  Watch Your Eating Habits Certain foods may increase the acid in your stomach or relax the lower esophageal sphincter, making GERD more likely. It's best to avoid the following: Coffee, tea, and carbonated drinks (with and without caffeine) Fatty, fried, or spicy food Mint, chocolate, onions, and tomatoes Any other foods that seem to irritate your stomach or cause you pain  Relieve the Pressure Eat smaller meals, even if you have to eat more often. Don't lie down right after you eat. Wait a few hours for your stomach to empty. Avoid tight belts and tight-fitting clothes. Lose excess weight.  Tobacco and Alcohol Avoid smoking tobacco and drinking alcohol. They can make GERD symptoms worse.  Reducing Pollen Exposure The American Academy of Allergy, Asthma and  Immunology suggests the following steps to reduce your exposure to pollen during allergy seasons. Do not hang sheets or clothing out to dry; pollen may collect on these items. Do not mow lawns or  spend time around freshly cut grass; mowing stirs up pollen. Keep windows closed at night.  Keep car windows closed while driving. Minimize morning activities outdoors, a time when pollen counts are usually at their highest. Stay indoors as much as possible when pollen counts or humidity is high and on windy days when pollen tends to remain in the air longer. Use air conditioning when possible.  Many air conditioners have filters that trap the pollen spores. Use a HEPA room air filter to remove pollen form the indoor air you breathe.  Control of Dog or Cat Allergen Avoidance is the best way to manage a dog or cat allergy. If you have a dog or cat and are allergic to dog or cats, consider removing the dog or cat from the home. If you have a dog or cat but don't want to find it a new home, or if your family wants a pet even though someone in the household is allergic, here are some strategies that may help keep symptoms at bay:  Keep the pet out of your bedroom and restrict it to only a few rooms. Be advised that keeping the dog or cat in only one room will not limit the allergens to that room. Don't pet, hug or kiss the dog or cat; if you do, wash your hands with soap and water. High-efficiency particulate air (HEPA) cleaners run continuously in a bedroom or living room can reduce allergen levels over time. Regular use of a high-efficiency vacuum cleaner or a central vacuum can reduce allergen levels. Giving your dog or cat a bath at least once a week can reduce airborne allergen.  Control of Mold Allergen Mold and fungi can grow on a variety of surfaces provided certain temperature and moisture conditions exist.  Outdoor molds grow on plants, decaying vegetation and soil.  The major outdoor mold,  Alternaria and Cladosporium, are found in very high numbers during hot and dry conditions.  Generally, a late Summer - Fall peak is seen for common outdoor fungal spores.  Rain will temporarily lower outdoor mold spore count, but counts rise rapidly when the rainy period ends.  The most important indoor molds are Aspergillus and Penicillium.  Dark, humid and poorly ventilated basements are ideal sites for mold growth.  The next most common sites of mold growth are the bathroom and the kitchen.  Outdoor [employer] Use air conditioning and keep windows closed Avoid exposure to decaying vegetation. Avoid leaf raking. Avoid grain handling. Consider wearing a face mask if working in moldy areas.  Indoor Mold Control Maintain humidity below 50%. Clean washable surfaces with 5% bleach solution. Remove sources e.g. Contaminated carpets.   Control of Dust Mite Allergen Dust mites play a major role in allergic asthma and rhinitis. They occur in environments with high humidity wherever human skin is found. Dust mites absorb humidity from the atmosphere (ie, they do not drink) and feed on organic matter (including shed human and animal skin). Dust mites are a microscopic type of insect that you cannot see with the naked eye. High levels of dust mites have been detected from mattresses, pillows, carpets, upholstered furniture, bed covers, clothes, soft toys and any woven material. The principal allergen of the dust mite is found in its feces. A gram of dust may contain 1,000 mites and 250,000 fecal particles. Mite antigen is easily measured in the air during house cleaning activities. Dust mites do not bite and do not cause harm to humans, other than by  triggering allergies/asthma.  Ways to decrease your exposure to dust mites in your home:  1. Encase mattresses, box springs and pillows with a mite-impermeable barrier or cover  2. Wash sheets, blankets and drapes weekly in hot water (130 F) with  detergent and dry them in a dryer on the hot setting.  3. Have the room cleaned frequently with a vacuum cleaner and a damp dust-mop. For carpeting or rugs, vacuuming with a vacuum cleaner equipped with a high-efficiency particulate air (HEPA) filter. The dust mite allergic individual should not be in a room which is being cleaned and should wait 1 hour after cleaning before going into the room.  4. Do not sleep on upholstered furniture (eg, couches).  5. If possible removing carpeting, upholstered furniture and drapery from the home is ideal. Horizontal blinds should be eliminated in the rooms where the person spends the most time (bedroom, study, television room). Washable vinyl, roller-type shades are optimal.  6. Remove all non-washable stuffed toys from the bedroom. Wash stuffed toys weekly like sheets and blankets above.  7. Reduce indoor humidity to less than 50%. Inexpensive humidity monitors can be purchased at most hardware stores. Do not use a humidifier as can make the problem worse and are not recommended.  Control of Cockroach Allergen Cockroach allergen has been identified as an important cause of acute attacks of asthma, especially in urban settings.  There are fifty-five species of cockroach that exist in the [place], however only three, the [place], [place] species produce allergen that can affect patients with Asthma.  Allergens can be obtained from fecal particles, egg casings and secretions from cockroaches.    Remove food sources. Reduce access to water. Seal access and entry points. Spray runways with 0.5-1% Diazinon or Chlorpyrifos Blow boric acid power under stoves and refrigerator. Place bait stations (hydramethylnon) at feeding sites.  Return in about 8 weeks (around 01/07/2024).    Thank you for the opportunity to care for this patient.  Please do not hesitate to contact me with questions.  Nehemiah Settle, FNP [facility]

## 2023-11-15 NOTE — Telephone Encounter (Signed)
Patient has been notified of the next steps.

## 2023-11-18 ENCOUNTER — Telehealth: Payer: Self-pay | Admitting: Family

## 2023-11-18 NOTE — Telephone Encounter (Signed)
Mackenzie Weber has been referred back to   [facility] [address] [place], [place] [address]  ([phone]  Referral and all corresponding notes have been faxed to their office.  They will reach out to the patient to schedule. Patient notified via MyChart message.

## 2023-11-21 ENCOUNTER — Encounter: Payer: Self-pay | Admitting: Allergy

## 2023-11-21 ENCOUNTER — Ambulatory Visit: Payer: 59 | Admitting: Allergy

## 2023-11-21 VITALS — BP 118/74 | HR 70 | Temp 98.0°F

## 2023-11-21 DIAGNOSIS — J3089 Other allergic rhinitis: Secondary | ICD-10-CM | POA: Diagnosis not present

## 2023-11-21 DIAGNOSIS — J4540 Moderate persistent asthma, uncomplicated: Secondary | ICD-10-CM | POA: Diagnosis not present

## 2023-11-21 DIAGNOSIS — K219 Gastro-esophageal reflux disease without esophagitis: Secondary | ICD-10-CM | POA: Diagnosis not present

## 2023-11-21 DIAGNOSIS — R42 Dizziness and giddiness: Secondary | ICD-10-CM | POA: Diagnosis not present

## 2023-11-21 DIAGNOSIS — J302 Other seasonal allergic rhinitis: Secondary | ICD-10-CM

## 2023-11-21 MED ORDER — IPRATROPIUM BROMIDE 0.06 % NA SOLN: 2.0000 | Freq: Four times a day (QID) | NASAL | 5 refills | Status: AC | PRN

## 2023-11-21 MED ORDER — METHYLPREDNISOLONE ACETATE 80 MG/ML IJ SUSP
80.0000 mg | Freq: Once | INTRAMUSCULAR | Status: AC
  Administered 2023-11-21: 80 mg via INTRAMUSCULAR

## 2023-11-21 NOTE — Progress Notes (Signed)
 Follow-up Note  RE: Mackenzie Weber MRN: [id number] DOB: May 04, 1961 Date of Office Visit: 11/21/2023   History of present illness: Mackenzie Weber is a 63 y.o. female presenting today for sick visit.  She has history of asthma, allergic rhinitis and reflux.  Her last visit was on 07/05/2024 by our nurse practitioner Cheryl. Discussed the use of AI scribe software for clinical note transcription with the patient, who gave verbal consent to proceed.  She has been experiencing persistent dizziness and hoarseness since the Monday before Christmas after visiting a house where she believes something triggered her allergies. She describes intermittent coughing and hoarseness since then.  Initially, she sought care at an urgent care facility where she was prescribed prednisone . Subsequently, she received additional treatment including antibiotics, nasal sprays, and cough medicine, which improved her coughing but not her dizziness.  Her current nasal spray regimen includes Flonase  and azelastine . She uses Flonase  in the morning and has been using a Q-tip swab method with the azelastine  to avoid the bad taste of the spray. The bad-tasting spray affects her taste buds she reports.  The dizziness began on a Friday morning after using a saline rinse which she suspects might have affected her ears. She describes the dizziness as feeling like she has been 'on a binge' and mentions difficulty in maintaining balance, especially when driving. She has to preach twice a week, and the hoarseness impacts her ability to speak. 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  Review of systems: 10pt ROS negative unless noted above in HPI   All other systems negative unless noted above in HPI  Past medical/social/surgical/family history have been reviewed and are unchanged unless specifically indicated below.  No changes  Medication List: Current Outpatient  Medications  Medication Sig Dispense Refill   albuterol  (VENTOLIN  HFA) 108 (90 Base) MCG/ACT inhaler Inhale 2 puffs into the lungs every 4 (four) hours as needed for wheezing or shortness of breath. 18 g 1   Albuterol -Budesonide  (AIRSUPRA ) 90-80 MCG/ACT AERO Inhale 2 puffs into the lungs as needed (maximum 12 puffs/day). 10.7 g 3   azelastine  (ASTELIN ) 0.1 % nasal spray 1-2 sprays  each nostril twice daily as needed 90 mL 1   blood glucose meter kit and supplies KIT Check blood sugar once daily. One Touch Verio Reflect Meter 1 each 0   Budeson-Glycopyrrol-Formoterol  (BREZTRI  AEROSPHERE) 160-9-4.8 MCG/ACT AERO Inhale 2 puffs into the lungs daily. Use with spacer. Rinse, gargle and spit out after use. 10.7 g 5   chlorpheniramine-HYDROcodone  (TUSSIONEX) 10-8 MG/5ML Take 5 mLs by mouth every 12 (twelve) hours as needed for cough.     EPINEPHrine  (AUVI-Q ) 0.3 mg/0.3 mL IJ SOAJ injection Inject 0.3 mg into the muscle as needed for anaphylaxis. 2 each 1   estradiol  (ESTRACE ) 0.1 MG/GM vaginal cream Place 1 Applicatorful vaginally once a week.     famotidine  (PEPCID ) 20 MG tablet Take 1 tablet (20 mg total) by mouth at bedtime. 30 tablet 5   fluticasone  (FLONASE ) 50 MCG/ACT nasal spray 2 sprays in each nostril once a day as needed for a stuffy nose 16 g 5   glucose blood (ONETOUCH VERIO) test strip Use as instructed 100 each 0   guaiFENesin  (MUCINEX ) 600 MG 12 hr tablet 600 to 1200 mg once a day and increase fluid intake as tolerated to thin mucus. 28 tablet 0   ipratropium (ATROVENT ) 0.03 % nasal spray Place 2 sprays into both  nostrils 3 (three) times daily. 30 mL 5   Lancets (ONETOUCH DELICA PLUS LANCET33G) MISC USE 1  TO CHECK GLUCOSE ONCE DAILY FOR BLOOD SUGAR 100 each 0   losartan  (COZAAR ) 100 MG tablet Take 1 tablet (100 mg total) by mouth daily. 90 tablet 3   lubiprostone  (AMITIZA ) 8 MCG capsule Take 1 capsule (8 mcg total) by mouth 2 (two) times daily with a meal. 60 capsule 5   metFORMIN   (GLUCOPHAGE -XR) 500 MG 24 hr tablet Take 1 tablet by mouth once daily with breakfast 90 tablet 3   Multiple Vitamin (MULTIVITAMIN) capsule Take 1 capsule by mouth daily.     pantoprazole  (PROTONIX ) 40 MG tablet Take 1 tablet (40 mg total) by mouth 2 (two) times daily. 30 tablet 5   predniSONE  (DELTASONE ) 10 MG tablet Take 10 mg by mouth See admin instructions.     predniSONE  (DELTASONE ) 10 MG tablet Take 1 tablet twice a day for 5 days and then stop 10 tablet 0   Semaglutide ,0.25 or 0.5MG /DOS, (OZEMPIC , 0.25 OR 0.5 MG/DOSE,) 2 MG/3ML SOPN Inject 0.25 mg into the skin once a week. 9 mL 1   triamterene -hydrochlorothiazide (MAXZIDE) 75-50 MG tablet Take 0.5 tablets by mouth daily. 45 tablet 1   No current facility-administered medications for this visit.     Known medication allergies: Allergies  Allergen Reactions   Methylprednisolone  Acetate Other (See Comments) and Rash   Cortisone     Red area around injection site x   Aspirin Anxiety and Other (See Comments)    Rapid Heart beat     Physical examination: Blood pressure 118/74, pulse 70, temperature 98 F (36.7 C), temperature source Temporal, SpO2 98%.  General: Alert, interactive, in no acute distress. HEENT: PERRLA, TMs pearly gray, turbinates minimally edematous without discharge, post-pharynx non erythematous. Neck: Supple without lymphadenopathy. Lungs: Clear to auscultation without wheezing, rhonchi or rales. {no increased work of breathing. CV: Normal S1, S2 without murmurs. Abdomen: Nondistended, nontender. Skin: Warm and dry, without lesions or rashes. Extremities:  No clubbing, cyanosis or edema. Neuro:   Grossly intact.  Diagnositics/Labs: None today  Assessment and plan: Asthma with recent exacerbation -improved CXR was normal Continue Breztri  2 puffs twice a day with a spacer to prevent cough or wheeze Use Airsupra  2 puffs inhaled as needed for cough, wheeze, tightness in chest, or shortness of breath. Do  not exceed 12 puffs in 24 hours  Allergic rhinitis A referral at last visit was place for [facility] due to sinusitis symptoms, hoarseness of voice, and recent antibiotics and steroids with continued symptoms.  Continue allergen avoidance measures directed toward pollens, pets, mold, cockroach, and dust mite Continue Allegra 180 mg once a day as needed for runny nose or itch.   Stop Azelastine .  Start use of Atrovent  0.06% 2 sprays in each nostril 3-4 times a day (would use right now 1-2 times a day) for runny nose Continue Flonase  2 sprays in each nostril once a day as needed for a stuffy nose.  In the right nostril, point the applicator out toward the right ear. In the left nostril, point the applicator out toward the left ear Consider saline nasal rinses as needed for nasal symptoms. Use this before any medicated nasal sprays for best result  Reflux Continue dietary and lifestyle modifications as listed below Continue to follow the treatment plan as listed by your gastrointestinal specialist  Dizziness Appears to be fluid behind the ear drum which can cause dizziness.  This  is most likely due to sinus inflammation and inability of the ear tubes to drain.  Steroid injection given in office today Use Atrovent  spray as above to help with mucus in the sinuses  Keep appt 01/09/24 with Dr Iva   I appreciate the opportunity to take part in Devita's care. Please do not hesitate to contact me with questions.  Sincerely,   Danita Brain, MD Allergy/Immunology [facility]

## 2023-11-21 NOTE — Patient Instructions (Addendum)
 Asthma with recent exacerbation -improved CXR was normal Continue Breztri  2 puffs twice a day with a spacer to prevent cough or wheeze Use Airsupra  2 puffs inhaled as needed for cough, wheeze, tightness in chest, or shortness of breath. Do not exceed 12 puffs in 24 hours  Allergic rhinitis A referral at last visit was place for [facility] due to sinusitis symptoms, hoarseness of voice, and recent antibiotics and steroids with continued symptoms.  Continue allergen avoidance measures directed toward pollens, pets, mold, cockroach, and dust mite Continue Allegra 180 mg once a day as needed for runny nose or itch.   Stop Azelastine .  Start use of Atrovent  0.06% 2 sprays in each nostril 3-4 times a day (would use right now 1-2 times a day) for runny nose Continue Flonase  2 sprays in each nostril once a day as needed for a stuffy nose.  In the right nostril, point the applicator out toward the right ear. In the left nostril, point the applicator out toward the left ear Consider saline nasal rinses as needed for nasal symptoms. Use this before any medicated nasal sprays for best result  Reflux Continue dietary and lifestyle modifications as listed below Continue to follow the treatment plan as listed by your gastrointestinal specialist  Dizziness Appears to be fluid behind the ear drum which can cause dizziness.  This is most likely due to sinus inflammation and inability of the ear tubes to drain.  Steroid injection given in office today Use Atrovent  spray as above to help with mucus in the sinuses  Keep appt 01/09/24 with Dr Iva

## 2023-11-25 ENCOUNTER — Telehealth: Payer: Self-pay | Admitting: Pharmacy Technician

## 2023-11-25 ENCOUNTER — Ambulatory Visit: Payer: 59 | Admitting: Nurse Practitioner

## 2023-11-25 ENCOUNTER — Other Ambulatory Visit (HOSPITAL_COMMUNITY): Payer: Self-pay

## 2023-11-25 ENCOUNTER — Encounter: Payer: Self-pay | Admitting: Nurse Practitioner

## 2023-11-25 VITALS — BP 114/80 | HR 67 | Temp 98.7°F | Ht <= 58 in | Wt 219.6 lb

## 2023-11-25 DIAGNOSIS — I10 Essential (primary) hypertension: Secondary | ICD-10-CM | POA: Diagnosis not present

## 2023-11-25 DIAGNOSIS — I700 Atherosclerosis of aorta: Secondary | ICD-10-CM

## 2023-11-25 DIAGNOSIS — K219 Gastro-esophageal reflux disease without esophagitis: Secondary | ICD-10-CM

## 2023-11-25 DIAGNOSIS — Z6841 Body Mass Index (BMI) 40.0 and over, adult: Secondary | ICD-10-CM | POA: Diagnosis not present

## 2023-11-25 DIAGNOSIS — E785 Hyperlipidemia, unspecified: Secondary | ICD-10-CM

## 2023-11-25 DIAGNOSIS — Z7985 Long-term (current) use of injectable non-insulin antidiabetic drugs: Secondary | ICD-10-CM | POA: Diagnosis not present

## 2023-11-25 DIAGNOSIS — K5904 Chronic idiopathic constipation: Secondary | ICD-10-CM

## 2023-11-25 DIAGNOSIS — E1169 Type 2 diabetes mellitus with other specified complication: Secondary | ICD-10-CM

## 2023-11-25 LAB — POCT GLYCOSYLATED HEMOGLOBIN (HGB A1C)
HbA1c POC (<> result, manual entry): 6.2 % (ref 4.0–5.6)
HbA1c, POC (controlled diabetic range): 6.2 % (ref 0.0–7.0)
HbA1c, POC (prediabetic range): 6.2 % (ref 5.7–6.4)
Hemoglobin A1C: 6.2 % — AB (ref 4.0–5.6)

## 2023-11-25 MED ORDER — PANTOPRAZOLE SODIUM 20 MG PO TBEC: 40.0000 mg | DELAYED_RELEASE_TABLET | Freq: Every day | ORAL | 1 refills | Status: DC

## 2023-11-25 MED ORDER — ROSUVASTATIN CALCIUM 10 MG PO TABS: 10.0000 mg | ORAL_TABLET | Freq: Every day | ORAL | 3 refills | Status: AC

## 2023-11-25 MED ORDER — MOUNJARO 5 MG/0.5ML ~~LOC~~ SOAJ: 5.0000 mg | SUBCUTANEOUS | 0 refills | Status: DC

## 2023-11-25 NOTE — Patient Instructions (Addendum)
 Improved HgbA1c: 6.7 to 6.2%  Schedule fasting lab appointment for blood draw in 31month.  Need to be fasting 8hrs prior to blood draw. Ok to drink water and take BP meds. Maintain Heart healthy diet and daily exercise. Schedule appointment for annual eye exam Switch Ozempic  to Mounjaro. Start crestor  10mg  in PM Maintain other med doses

## 2023-11-25 NOTE — Progress Notes (Signed)
 Established Patient Visit  Patient: Mackenzie Weber   DOB: 09-29-1961   63 y.o. Female  MRN: [id number] Visit Date: 11/25/2023  Subjective:    Chief Complaint  Patient presents with   Diabetes    Follow up, no concerns   HPI Hypertensive disorder BP at goal with losartan  and maxzide BP Readings from Last 3 Encounters:  11/25/23 114/80  11/21/23 118/74  11/12/23 122/70    Repeat CMP Maintain med doses  Chronic idiopathic constipation Improved with amitiza  8mcg daily  GERD (gastroesophageal reflux disease) Stable with pantoprazole  40mg  daily Under the care of Dr. Venice Gillis. Last appt 09/2021. EGD 03/2021: hiatal hernia and barrett's esophagus Last colonoscopy 2020, repeat in 18yrs  DM (diabetes mellitus) (HCC) Persiste nausea and GERD symptoms with ozempic  0.25mg  No neuropathy or nephropathy. Requested DIABETES eye exam report.  Repeat hgbA1c-6.2% improved Switch ozempic  0.25mg  to mounjaro 5mg  repeat CMP and lipid panel in 59month-fasting F/up in 39month  Hyperlipidemia associated with type 2 diabetes mellitus (HCC) LDL not at goal in the presence of aortic atherosclerosis. Adgreed to start crestor  10mg  in PM Advised about possible med side effects Advised to maintain heart healthy diet Repeat lipid panel in 59month  Obesity, morbid (HCC) Lost 3lbs with ozempic  Wt Readings from Last 3 Encounters:  11/25/23 219 lb 9.6 oz (99.6 kg)  11/12/23 222 lb 14.4 oz (101.1 kg)  10/14/23 222 lb 4.8 oz (100.8 kg)    Advised to maintain heart healthy diet and low impact exercise   Reviewed medical, surgical, and social history today  Medications: Outpatient Medications Prior to Visit  Medication Sig   albuterol  (VENTOLIN  HFA) 108 (90 Base) MCG/ACT inhaler Inhale 2 puffs into the lungs every 4 (four) hours as needed for wheezing or shortness of breath.   Albuterol -Budesonide  (AIRSUPRA ) 90-80 MCG/ACT AERO Inhale 2 puffs into the lungs as needed (maximum 12  puffs/day).   azelastine  (ASTELIN ) 0.1 % nasal spray 1-2 sprays  each nostril twice daily as needed   blood glucose meter kit and supplies KIT Check blood sugar once daily. One Touch Verio Reflect Meter   Budeson-Glycopyrrol-Formoterol  (BREZTRI  AEROSPHERE) 160-9-4.8 MCG/ACT AERO Inhale 2 puffs into the lungs daily. Use with spacer. Rinse, gargle and spit out after use.   chlorpheniramine-HYDROcodone  (TUSSIONEX) 10-8 MG/5ML Take 5 mLs by mouth every 12 (twelve) hours as needed for cough.   EPINEPHrine  (AUVI-Q ) 0.3 mg/0.3 mL IJ SOAJ injection Inject 0.3 mg into the muscle as needed for anaphylaxis.   estradiol  (ESTRACE ) 0.1 MG/GM vaginal cream Place 1 Applicatorful vaginally once a week.   famotidine  (PEPCID ) 20 MG tablet Take 1 tablet (20 mg total) by mouth at bedtime.   fluticasone  (FLONASE ) 50 MCG/ACT nasal spray 2 sprays in each nostril once a day as needed for a stuffy nose   glucose blood (ONETOUCH VERIO) test strip Use as instructed   guaiFENesin  (MUCINEX ) 600 MG 12 hr tablet 600 to 1200 mg once a day and increase fluid intake as tolerated to thin mucus.   ipratropium (ATROVENT ) 0.06 % nasal spray Place 2 sprays into both nostrils 4 (four) times daily as needed for rhinitis.   Lancets (ONETOUCH DELICA PLUS LANCET33G) MISC USE 1  TO CHECK GLUCOSE ONCE DAILY FOR BLOOD SUGAR   losartan  (COZAAR ) 100 MG tablet Take 1 tablet (100 mg total) by mouth daily.   lubiprostone  (AMITIZA ) 8 MCG capsule Take 1 capsule (8 mcg total) by mouth 2 (two)  times daily with a meal.   metFORMIN  (GLUCOPHAGE -XR) 500 MG 24 hr tablet Take 1 tablet by mouth once daily with breakfast   Multiple Vitamin (MULTIVITAMIN) capsule Take 1 capsule by mouth daily.   triamterene -hydrochlorothiazide (MAXZIDE) 75-50 MG tablet Take 0.5 tablets by mouth daily.   [DISCONTINUED] pantoprazole  (PROTONIX ) 40 MG tablet Take 1 tablet (40 mg total) by mouth 2 (two) times daily.   [DISCONTINUED] Semaglutide ,0.25 or 0.5MG /DOS, (OZEMPIC , 0.25 OR  0.5 MG/DOSE,) 2 MG/3ML SOPN Inject 0.25 mg into the skin once a week.   [DISCONTINUED] ipratropium (ATROVENT ) 0.03 % nasal spray Place 2 sprays into both nostrils 3 (three) times daily. (Patient not taking: Reported on 11/25/2023)   [DISCONTINUED] predniSONE  (DELTASONE ) 10 MG tablet Take 10 mg by mouth See admin instructions. (Patient not taking: Reported on 11/25/2023)   [DISCONTINUED] predniSONE  (DELTASONE ) 10 MG tablet Take 1 tablet twice a day for 5 days and then stop (Patient not taking: Reported on 11/25/2023)   No facility-administered medications prior to visit.   Reviewed past medical and social history.   ROS per HPI above      Objective:  BP 114/80 (BP Location: Right Arm, Patient Position: Sitting)   Pulse 67   Temp 98.7 F (37.1 C)   Ht 4\' 10"  (1.473 m)   Wt 219 lb 9.6 oz (99.6 kg)   SpO2 100%   BMI 45.90 kg/m      Physical Exam Constitutional:      Appearance: She is obese.  Cardiovascular:     Rate and Rhythm: Normal rate and regular rhythm.     Pulses: Normal pulses.     Heart sounds: Normal heart sounds.  Pulmonary:     Effort: Pulmonary effort is normal.     Breath sounds: Normal breath sounds.  Musculoskeletal:     Right lower leg: Edema present.     Left lower leg: Edema present.  Neurological:     Mental Status: She is alert and oriented to person, place, and time.     Results for orders placed or performed in visit on 11/25/23  POCT glycosylated hemoglobin (Hb A1C)  Result Value Ref Range   Hemoglobin A1C 6.2 (A) 4.0 - 5.6 %   HbA1c POC (<> result, manual entry) 6.2 4.0 - 5.6 %   HbA1c, POC (prediabetic range) 6.2 5.7 - 6.4 %   HbA1c, POC (controlled diabetic range) 6.2 0.0 - 7.0 %      Assessment & Plan:    Problem List Items Addressed This Visit     Chronic idiopathic constipation   Improved with amitiza  8mcg daily      DM (diabetes mellitus) (HCC)   Persiste nausea and GERD symptoms with ozempic  0.25mg  No neuropathy or  nephropathy. Requested DIABETES eye exam report.  Repeat hgbA1c-6.2% improved Switch ozempic  0.25mg  to mounjaro 5mg  repeat CMP and lipid panel in 68month-fasting F/up in 13month      Relevant Medications   tirzepatide (MOUNJARO) 5 MG/0.5ML Pen   rosuvastatin  (CRESTOR ) 10 MG tablet   Other Relevant Orders   Comprehensive metabolic panel   POCT glycosylated hemoglobin (Hb A1C) (Completed)   GERD (gastroesophageal reflux disease)   Stable with pantoprazole  40mg  daily Under the care of Dr. Venice Gillis. Last appt 09/2021. EGD 03/2021: hiatal hernia and barrett's esophagus Last colonoscopy 2020, repeat in 58yrs      Relevant Medications   pantoprazole  (PROTONIX ) 20 MG tablet   Hyperlipidemia associated with type 2 diabetes mellitus (HCC) - Primary   LDL not at  goal in the presence of aortic atherosclerosis. Adgreed to start crestor  10mg  in PM Advised about possible med side effects Advised to maintain heart healthy diet Repeat lipid panel in 10month      Relevant Medications   tirzepatide (MOUNJARO) 5 MG/0.5ML Pen   rosuvastatin  (CRESTOR ) 10 MG tablet   Other Relevant Orders   Comprehensive metabolic panel   Lipid panel   Hypertensive disorder   BP at goal with losartan  and maxzide BP Readings from Last 3 Encounters:  11/25/23 114/80  11/21/23 118/74  11/12/23 122/70    Repeat CMP Maintain med doses      Relevant Medications   rosuvastatin  (CRESTOR ) 10 MG tablet   Other Relevant Orders   Comprehensive metabolic panel   Obesity, morbid (HCC)   Lost 3lbs with ozempic  Wt Readings from Last 3 Encounters:  11/25/23 219 lb 9.6 oz (99.6 kg)  11/12/23 222 lb 14.4 oz (101.1 kg)  10/14/23 222 lb 4.8 oz (100.8 kg)    Advised to maintain heart healthy diet and low impact exercise      Relevant Medications   tirzepatide (MOUNJARO) 5 MG/0.5ML Pen   Other Visit Diagnoses       Aortic atherosclerosis (HCC)       Relevant Medications   rosuvastatin  (CRESTOR ) 10 MG tablet   Other  Relevant Orders   Comprehensive metabolic panel   Lipid panel      Return in about 3 months (around 02/14/2024) for HTN, DM, hyperlipidemia (fasting).     Kathrene Parents, NP

## 2023-11-25 NOTE — Assessment & Plan Note (Signed)
 BP at goal with losartan  and maxzide BP Readings from Last 3 Encounters:  11/25/23 114/80  11/21/23 118/74  11/12/23 122/70    Repeat CMP Maintain med doses

## 2023-11-25 NOTE — Assessment & Plan Note (Signed)
 Stable with pantoprazole  40mg  daily Under the care of Dr. Venice Gillis. Last appt 09/2021. EGD 03/2021: hiatal hernia and barrett's esophagus Last colonoscopy 2020, repeat in 1yrs

## 2023-11-25 NOTE — Telephone Encounter (Signed)
 Pharmacy Patient Advocate Encounter   Received notification from CoverMyMeds that prior authorization for Mounjaro 5MG /0.5ML auto-injectors is required/requested.   Insurance verification completed.   The patient is insured through [facility] .   Per test claim: PA required; PA started via CoverMyMeds. KEY [id number] . Waiting for clinical questions to populate.

## 2023-11-25 NOTE — Assessment & Plan Note (Signed)
 LDL not at goal in the presence of aortic atherosclerosis. Adgreed to start crestor  10mg  in PM Advised about possible med side effects Advised to maintain heart healthy diet Repeat lipid panel in 5month

## 2023-11-25 NOTE — Assessment & Plan Note (Signed)
 Persiste nausea and GERD symptoms with ozempic  0.25mg  No neuropathy or nephropathy. Requested DIABETES eye exam report.  Repeat hgbA1c-6.2% improved Switch ozempic  0.25mg  to mounjaro 5mg  repeat CMP and lipid panel in 75month-fasting F/up in 47month

## 2023-11-25 NOTE — Assessment & Plan Note (Signed)
 Improved with amitiza  8mcg daily

## 2023-11-25 NOTE — Telephone Encounter (Signed)
 Clinical questions have been answered and PA submitted. PA currently Pending. Please be advised that most companies allow up to 30 days to make a decision. We will advise when a determination has been made, or follow up in 1 week.   Please reach out to our team, Rx Prior Auth Pool, if you haven't heard back in a week.

## 2023-11-25 NOTE — Assessment & Plan Note (Signed)
 Lost 3lbs with ozempic  Wt Readings from Last 3 Encounters:  11/25/23 219 lb 9.6 oz (99.6 kg)  11/12/23 222 lb 14.4 oz (101.1 kg)  10/14/23 222 lb 4.8 oz (100.8 kg)    Advised to maintain heart healthy diet and low impact exercise

## 2023-11-26 ENCOUNTER — Other Ambulatory Visit (HOSPITAL_COMMUNITY): Payer: Self-pay

## 2023-11-26 NOTE — Telephone Encounter (Signed)
Pharmacy Patient Advocate Encounter  Received notification from [facility] that Prior Authorization for [facility] 5MG /0.5ML auto-injectors has been APPROVED from 11/25/2023 to 11/23/2026. Ran test claim, Copay is $30.00. This test claim was processed through [facility]- copay amounts may vary at other pharmacies due to pharmacy/plan contracts, or as the patient moves through the different stages of their insurance plan.   PA #/Case ID/Reference #: [id number]

## 2023-11-28 ENCOUNTER — Other Ambulatory Visit: Payer: Self-pay | Admitting: Allergy & Immunology

## 2023-12-12 ENCOUNTER — Ambulatory Visit: Payer: BC Managed Care – PPO | Admitting: Family Medicine

## 2024-01-02 ENCOUNTER — Ambulatory Visit
Admission: RE | Admit: 2024-01-02 | Discharge: 2024-01-02 | Disposition: A | Discharge status: Home / Self Care | Payer: 59 | Source: Ambulatory Visit | Attending: Obstetrics and Gynecology | Admitting: Obstetrics and Gynecology

## 2024-01-02 DIAGNOSIS — Z1231 Encounter for screening mammogram for malignant neoplasm of breast: Secondary | ICD-10-CM

## 2024-01-09 ENCOUNTER — Other Ambulatory Visit: Payer: Self-pay

## 2024-01-09 ENCOUNTER — Encounter: Payer: Self-pay | Admitting: Allergy & Immunology

## 2024-01-09 ENCOUNTER — Ambulatory Visit (INDEPENDENT_AMBULATORY_CARE_PROVIDER_SITE_OTHER): Payer: 59 | Admitting: Allergy & Immunology

## 2024-01-09 VITALS — BP 126/70 | HR 71 | Temp 97.9°F | Resp 12

## 2024-01-09 DIAGNOSIS — J302 Other seasonal allergic rhinitis: Secondary | ICD-10-CM

## 2024-01-09 DIAGNOSIS — J4540 Moderate persistent asthma, uncomplicated: Secondary | ICD-10-CM

## 2024-01-09 DIAGNOSIS — K219 Gastro-esophageal reflux disease without esophagitis: Secondary | ICD-10-CM

## 2024-01-09 DIAGNOSIS — L508 Other urticaria: Secondary | ICD-10-CM | POA: Diagnosis not present

## 2024-01-09 DIAGNOSIS — J3089 Other allergic rhinitis: Secondary | ICD-10-CM

## 2024-01-09 NOTE — Progress Notes (Signed)
 FOLLOW UP  Date of Service/Encounter:  01/09/24   Assessment:   Moderate persistent asthma, uncomplicated - with improved control since using her medications as directed   Perennial and seasonal allergic rhinitis - previously on allergen immunotherapy but now stopping   GERD - on Nexium and Pepcid (followed by Dr. Chales Abrahams)   Acute sinusitis   [employer] and Restaurant manager, [employer] food    Plan/Recommendations:   1. Moderate persistent asthma without complication - Lung testing looks stable (you have not been normal from a breathing test perspective in a number of years).  - I want to see you again in 3 months to make sure that your FEV1 is not continuing to trend downward. - We may consider Tezspire in the future if needed (handout provided). - Daily controller medication(s): Breztri two puffs TWICE daily - Prior to physical activity: AiurSupra two puffs 10-15 minutes before physical activity. - Rescue medications: AirSupra 2-4 puffs every 4-6 hours as needed - Asthma control goals:  * Full participation in all desired activities (may need albuterol before activity) * Albuterol use two time or less a week on average (not counting use with activity) * Cough interfering with sleep two time or less a month * Oral steroids no more than once a year * No hospitalizations  2. Perennial and seasonal allergic rhinitis - Continue with the nasal saline lavage. - Continue with Allegra 1-2 times daily.  3. Gastroesophageal reflux disease - Continue with Protonix and Pepcid at least once daily.   4. Return in about 3 months (around 04/10/2024). You can have the follow up appointment with Dr. Dellis Anes or a Nurse Practicioner (our Nurse Practitioners are excellent and always have Physician oversight!).   Subjective:   Mackenzie Weber is a 63 y.o. female presenting today for follow up of  Chief Complaint  Patient presents with   Asthma    No concerns    Mackenzie Weber has a history of the  following: Patient Active Problem List   Diagnosis Date Noted   Hyperlipidemia associated with type 2 diabetes mellitus (HCC) 11/25/2023   Moderate persistent asthma with acute exacerbation 10/14/2023   Chronic idiopathic constipation 08/22/2023   GAD (generalized anxiety disorder) 12/15/2021   Allergic conjunctivitis of both eyes 11/16/2020   Allergic conjunctivitis 05/30/2020   Not well controlled moderate persistent asthma 05/30/2020   Grade I diastolic dysfunction 03/18/2020   Bilateral foot pain 01/13/2020   Extensor tendonitis of foot 01/13/2020   DM (diabetes mellitus) (HCC) 01/01/2019   Seasonal and perennial allergic rhinitis 05/20/2018   Chronic bilateral low back pain with left-sided sciatica 05/20/2018   Lipoma of left upper extremity 01/29/2018   Muscle spasm of left lower extremity 01/29/2018   Intestinal ulcer 04/23/2017   Bilateral lower extremity edema 04/23/2017   GERD (gastroesophageal reflux disease) 03/28/2016   History of colon polyps 03/28/2016   Microcytic anemia 03/28/2016   Barrett's esophagus 03/28/2016   Somatic dysfunction of cervical region 11/16/2015   Chronic sciatica 01/28/2015   Somatic dysfunction of pelvic region 01/28/2015   Chronic pelvic pain in female 01/28/2015   Somatic dysfunction of lower extremity 01/28/2015   Somatic dysfunction of sacral region 01/28/2015   Hypertensive disorder 10/22/2014   Obesity, morbid (HCC) 10/22/2014   Abdominal muscle strain 06/07/2014   Sciatica of left side 11/03/2012    History obtained from: chart review and patient.  Discussed the use of AI scribe software for clinical note transcription with the patient and/or guardian, who gave verbal  consent to proceed.  Mackenzie Weber is a 63 y.o. female presenting for a follow up visit.  She was last seen in February 2025 by Dr. Delorse Lek. At that time, she was reporting some dizziness. She was continued on Breztri two puffs BID. A recent CXR was normal. For her rhinitis,  She was continued on Allegra as well as Flonase.  Her Astelin was stopped and she was changed to Atrovent instead.  She had fluid behind her eardrum which Dr. Delorse Lek felt was related to her dizziness.  She received a steroid injection in the office.   Since the last visit, she has done well.   She experienced hoarseness and coughing that began the week before Christmas and persisted until her last visit. She received a Depo injection which helped alleviate her symptoms, although it caused skin irritation. She also required antibiotics during this period. She has largely done well since recovering and she was very thankful that Dr. Delorse Lek was able to fit her in.   Asthma/Respiratory Symptom History: She has a history of asthma and is currently using Breztri, which she finds effective. She did not use her Breztri on the day of the visit but notes that her breathing is consistent with her last visit. She was previously using Airsupra but was taken off it by another physician. She does not frequently use her rescue inhaler and mentions that her asthma does not prevent her from performing activities she wants to do. However, she has noticed fluid retention affecting her ability to carry things and walk uphill. She is biologic naive.   Allergic Rhinitis Symptom History: She uses Allegra and a saltwater rinse for her allergies, and she has a variety of nasal sprays but finds them uncomfortable to use. She feels better when she uses the nasal rinse instead of the sprays.   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 She has been busy with projects, which has affected her routine, but she plans to resume her walking regimen now that her projects are completed.   Otherwise, there have been no changes to her past medical history, surgical history, family history, or social history.    Review of systems otherwise negative other than that mentioned in the HPI.    Objective:    Blood pressure 126/70, pulse 71, temperature 97.9 F (36.6 C), resp. rate 12, SpO2 99%. There is no height or weight on file to calculate BMI.    Physical Exam Vitals reviewed.  Constitutional:      Appearance: She is well-developed.     Comments: Boisterous. Delightful as always. Bubbly.   HENT:     Head: Normocephalic and atraumatic.     Right Ear: Tympanic membrane, ear canal and external ear normal.     Left Ear: Tympanic membrane, ear canal and external ear normal.     Nose: No nasal deformity, septal deviation, mucosal edema or rhinorrhea.     Right Turbinates: Enlarged, swollen and pale.     Left Turbinates: Enlarged, swollen and pale.     Right Sinus: No maxillary sinus tenderness or frontal sinus tenderness.     Left Sinus: No maxillary sinus tenderness or frontal sinus tenderness.     Comments: No polyps. No rhinorrhea today.     Mouth/Throat:     Mouth: Mucous membranes are not pale and not dry.     Pharynx: Uvula midline.  Eyes:     General: Lids are normal. Allergic shiner present.  Right eye: No discharge.        Left eye: No discharge.     Conjunctiva/sclera: Conjunctivae normal.     Right eye: Right conjunctiva is not injected. No chemosis.    Left eye: Left conjunctiva is not injected. No chemosis.    Pupils: Pupils are equal, round, and reactive to light.  Cardiovascular:     Rate and Rhythm: Normal rate and regular rhythm.     Heart sounds: Normal heart sounds.  Pulmonary:     Effort: No tachypnea, accessory muscle usage, respiratory distress or retractions.     Breath sounds: Examination of the right-lower field reveals decreased breath sounds. Decreased breath sounds present. No wheezing, rhonchi or rales.     Comments: No increased work of breathing noted. No crackles.  Chest:     Chest wall: No tenderness.  Lymphadenopathy:     Cervical: No cervical adenopathy.  Skin:    General: Skin is warm.     Capillary Refill: Capillary refill takes  less than 2 seconds.     Coloration: Skin is not pale.     Findings: No abrasion, erythema, petechiae or rash. Rash is not papular, urticarial or vesicular.  Neurological:     Mental Status: She is alert.  Psychiatric:        Behavior: Behavior is cooperative.      Diagnostic studies:    Spirometry: results abnormal (FEV1: 1.00/58%, FVC: 1.33/62%, FEV1/FVC: 75%).    Spirometry consistent with possible restrictive disease. This is about stable for her. She has been normal in the past.    Allergy Studies: none       Malachi Bonds, MD  [facility]

## 2024-01-09 NOTE — Addendum Note (Signed)
 Addended by: Floydene Flock C on: 01/09/2024 05:14 PM   Modules accepted: Orders

## 2024-01-09 NOTE — Patient Instructions (Addendum)
 1. Moderate persistent asthma without complication - Lung testing looks stable (you have not been normal from a breathing test perspective in a number of years).  - I want to see you again in 3 months to make sure that your FEV1 is not continuing to trend downward. - We may consider Tezspire in the future if needed (handout provided). - Daily controller medication(s): Breztri two puffs TWICE daily - Prior to physical activity: AiurSupra two puffs 10-15 minutes before physical activity. - Rescue medications: AirSupra 2-4 puffs every 4-6 hours as needed - Asthma control goals:  * Full participation in all desired activities (may need albuterol before activity) * Albuterol use two time or less a week on average (not counting use with activity) * Cough interfering with sleep two time or less a month * Oral steroids no more than once a year * No hospitalizations  2. Perennial and seasonal allergic rhinitis - Continue with the nasal saline lavage. - Continue with Allegra 1-2 times daily.  3. Gastroesophageal reflux disease - Continue with Protonix and Pepcid at least once daily.   4. Return in about 3 months (around 04/10/2024). You can have the follow up appointment with Dr. Dellis Anes or a Nurse Practicioner (our Nurse Practitioners are excellent and always have Physician oversight!).    Please inform us of any Emergency Department visits, hospitalizations, or changes in symptoms. Call us before going to the ED for breathing or allergy symptoms since we might be able to fit you in for a sick visit. Feel free to contact us anytime with any questions, problems, or concerns.  It was a pleasure to see you again today!  Websites that have reliable patient information: 1. American Academy of Asthma, Allergy, and Immunology: www.aaaai.org 2. Food Allergy Research and Education (FARE): foodallergy.org 3. Mothers of Asthmatics: http://www.asthmacommunitynetwork.org 4. American College of Allergy,  Asthma, and Immunology: www.acaai.org      "Like" [place] on Facebook and Instagram for our latest updates!      A healthy democracy works best when [employer] participate! Make sure you are registered to vote! If you have moved or changed any of your contact information, you will need to get this updated before voting! Scan the QR codes below to learn more!

## 2024-02-03 ENCOUNTER — Other Ambulatory Visit: Payer: Self-pay | Admitting: Nurse Practitioner

## 2024-02-03 ENCOUNTER — Other Ambulatory Visit: Payer: Self-pay

## 2024-02-03 DIAGNOSIS — I10 Essential (primary) hypertension: Secondary | ICD-10-CM

## 2024-02-03 MED ORDER — LOSARTAN POTASSIUM 100 MG PO TABS: 100.0000 mg | ORAL_TABLET | Freq: Every day | ORAL | 3 refills | Status: DC

## 2024-02-03 NOTE — Telephone Encounter (Signed)
 Copied from CRM [id number]. Topic: Clinical - Medication Refill >> Feb 03, 2024  9:25 AM Clydene Darner H wrote: Most Recent Primary Care Visit:  Provider: Kandace Organ  Department: [facility]  Visit Type: OFFICE VISIT  Date: 11/25/2023  Medication: Losartan    Has the patient contacted their pharmacy? Yes (Agent: If no, request that the patient contact the pharmacy for the refill. If patient does not wish to contact the pharmacy document the reason why and proceed with request.) (Agent: If yes, when and what did the pharmacy advise?)  Is this the correct pharmacy for this prescription? Yes If no, delete pharmacy and type the correct one.  This is the patient's preferred pharmacy:  [id number] - [place], [place] - [address] [place] [address] Phone: [phone] Fax: [phone]   Has the prescription been filled recently? No  Is the patient out of the medication? Yes  Has the patient been seen for an appointment in the last year OR does the patient have an upcoming appointment? Yes  Can we respond through MyChart? Yes  Agent: Please be advised that Rx refills may take up to 3 business days. We ask that you follow-up with your pharmacy.

## 2024-02-03 NOTE — Telephone Encounter (Signed)
 Reached out to [facility] pharmacy below and they stated that rx has expired and they need a new script. Tried to escribe medication, but it reverts back to print even though marked normal. Can you please assist with refill?   LOV: 11/25/2023 NOV: 02/18/2024

## 2024-02-04 ENCOUNTER — Other Ambulatory Visit: Payer: Self-pay | Admitting: Nurse Practitioner

## 2024-02-04 DIAGNOSIS — I10 Essential (primary) hypertension: Secondary | ICD-10-CM

## 2024-02-05 MED ORDER — LOSARTAN POTASSIUM 100 MG PO TABS: 100.0000 mg | ORAL_TABLET | Freq: Every day | ORAL | 0 refills | Status: DC

## 2024-02-18 ENCOUNTER — Ambulatory Visit: Payer: 59 | Admitting: Nurse Practitioner

## 2024-02-18 ENCOUNTER — Encounter: Payer: Self-pay | Admitting: Nurse Practitioner

## 2024-02-18 VITALS — BP 122/76 | HR 64 | Temp 97.8°F | Ht 60.0 in | Wt 217.4 lb

## 2024-02-18 DIAGNOSIS — K5904 Chronic idiopathic constipation: Secondary | ICD-10-CM

## 2024-02-18 DIAGNOSIS — E1169 Type 2 diabetes mellitus with other specified complication: Secondary | ICD-10-CM | POA: Diagnosis not present

## 2024-02-18 DIAGNOSIS — E785 Hyperlipidemia, unspecified: Secondary | ICD-10-CM

## 2024-02-18 DIAGNOSIS — R600 Localized edema: Secondary | ICD-10-CM

## 2024-02-18 DIAGNOSIS — I10 Essential (primary) hypertension: Secondary | ICD-10-CM | POA: Diagnosis not present

## 2024-02-18 DIAGNOSIS — D509 Iron deficiency anemia, unspecified: Secondary | ICD-10-CM

## 2024-02-18 DIAGNOSIS — Z7985 Long-term (current) use of injectable non-insulin antidiabetic drugs: Secondary | ICD-10-CM

## 2024-02-18 MED ORDER — MOUNJARO 5 MG/0.5ML ~~LOC~~ SOAJ: 5.0000 mg | SUBCUTANEOUS | 1 refills | Status: DC

## 2024-02-18 NOTE — Progress Notes (Unsigned)
 Established Patient Visit  Patient: Mackenzie Weber   DOB: [birth year]   63 y.o. Female  MRN: [id number] Visit Date: 02/19/2024  Subjective:    Chief Complaint  Patient presents with   Follow-up    3 month f/u    Skin redness    Left side buttocks redness and itchy    HPI Chronic idiopathic constipation Stable with amitiza  despite use of mounjaro  DM (diabetes mellitus) (HCC) No adverse effects with mounjaro 5mg  No neuropathy Advised to schedule Dm eye exam Previous LDL at goal with crestor  Normal previous UACr Current use of GLP-1RA and ARB  Repeat hgbA1c, CMP, lipid panel and UACr Advised to maintain daily exercise and heart healthy diet F/up in 3months   Hyperlipidemia associated with type 2 diabetes mellitus (HCC) Previous LDL at goal with crestor  10mg  Repeat CMP and lipid panel  Microcytic anemia UTD with colonoscopy Postmenopause  Check cbc and iron panel  BP Readings from Last 3 Encounters:  02/18/24 122/76  01/09/24 126/70  11/25/23 114/80    Wt Readings from Last 3 Encounters:  02/18/24 217 lb 6.4 oz (98.6 kg)  11/25/23 219 lb 9.6 oz (99.6 kg)  11/12/23 222 lb 14.4 oz (101.1 kg)    Reviewed medical, surgical, and social history today  Medications: Outpatient Medications Prior to Visit  Medication Sig   albuterol  (VENTOLIN  HFA) 108 (90 Base) MCG/ACT inhaler Inhale 2 puffs into the lungs every 4 (four) hours as needed for wheezing or shortness of breath.   Albuterol -Budesonide  (AIRSUPRA ) 90-80 MCG/ACT AERO Inhale 2 puffs into the lungs as needed (maximum 12 puffs/day).   azelastine  (ASTELIN ) 0.1 % nasal spray 1-2 sprays  each nostril twice daily as needed   blood glucose meter kit and supplies KIT Check blood sugar once daily. One Touch Verio Reflect Meter   Budeson-Glycopyrrol-Formoterol  (BREZTRI  AEROSPHERE) 160-9-4.8 MCG/ACT AERO Inhale 2 puffs into the lungs daily. Use with spacer. Rinse, gargle and spit out after use.   clobetasol  ointment (TEMOVATE) 0.05 % Apply 1 Application topically 2 (two) times daily. For 7 days   EPINEPHrine  (AUVI-Q ) 0.3 mg/0.3 mL IJ SOAJ injection Inject 0.3 mg into the muscle as needed for anaphylaxis.   estradiol  (ESTRACE ) 0.1 MG/GM vaginal cream Place 1 Applicatorful vaginally once a week.   famotidine  (PEPCID ) 20 MG tablet TAKE 1 TABLET BY MOUTH AT BEDTIME   fluticasone  (FLONASE ) 50 MCG/ACT nasal spray 2 sprays in each nostril once a day as needed for a stuffy nose   glucose blood (ONETOUCH VERIO) test strip Use as instructed   ipratropium (ATROVENT ) 0.06 % nasal spray Place 2 sprays into both nostrils 4 (four) times daily as needed for rhinitis.   Lancets (ONETOUCH DELICA PLUS LANCET33G) MISC USE 1  TO CHECK GLUCOSE ONCE DAILY FOR BLOOD SUGAR   lubiprostone  (AMITIZA ) 8 MCG capsule Take 1 capsule (8 mcg total) by mouth 2 (two) times daily with a meal.   metFORMIN  (GLUCOPHAGE -XR) 500 MG 24 hr tablet Take 1 tablet by mouth once daily with breakfast   Multiple Vitamin (MULTIVITAMIN) capsule Take 1 capsule by mouth daily.   pantoprazole  (PROTONIX ) 20 MG tablet Take 2 tablets (40 mg total) by mouth daily.   rosuvastatin  (CRESTOR ) 10 MG tablet Take 1 tablet (10 mg total) by mouth daily.   [DISCONTINUED] losartan  (COZAAR ) 100 MG tablet Take 1 tablet (100 mg total) by mouth daily.   [DISCONTINUED] tirzepatide (MOUNJARO) 5 MG/0.5ML  Pen Inject 5 mg into the skin once a week.   [DISCONTINUED] triamterene -hydrochlorothiazide (MAXZIDE) 75-50 MG tablet Take 0.5 tablets by mouth daily. (Patient taking differently: Take 0.5 tablets by mouth every other day.)   [DISCONTINUED] chlorpheniramine-HYDROcodone  (TUSSIONEX) 10-8 MG/5ML Take 5 mLs by mouth every 12 (twelve) hours as needed for cough. (Patient not taking: Reported on 02/18/2024)   [DISCONTINUED] guaiFENesin  (MUCINEX ) 600 MG 12 hr tablet 600 to 1200 mg once a day and increase fluid intake as tolerated to thin mucus. (Patient not taking: Reported on 02/18/2024)    No facility-administered medications prior to visit.   Reviewed past medical and social history.   ROS per HPI above      Objective:  BP 122/76 (BP Location: Right Arm, Patient Position: Sitting)   Pulse 64   Temp 97.8 F (36.6 C) (Temporal)   Ht 5' (1.524 m)   Wt 217 lb 6.4 oz (98.6 kg)   SpO2 100%   BMI 42.46 kg/m      Physical Exam Vitals and nursing note reviewed.  Cardiovascular:     Rate and Rhythm: Normal rate and regular rhythm.     Pulses: Normal pulses.     Heart sounds: Normal heart sounds.  Pulmonary:     Effort: Pulmonary effort is normal.     Breath sounds: Normal breath sounds.  Musculoskeletal:     Right lower leg: Edema present.     Left lower leg: Edema present.  Skin:    General: Skin is warm and dry.     Findings: No erythema, lesion or rash.  Neurological:     Mental Status: She is alert and oriented to person, place, and time.     No results found for any visits on 02/18/24.    Assessment & Plan:    Problem List Items Addressed This Visit     Bilateral lower extremity edema   Relevant Medications   triamterene -hydrochlorothiazide (MAXZIDE) 75-50 MG tablet   Chronic idiopathic constipation - Primary   Stable with amitiza  despite use of mounjaro      DM (diabetes mellitus) (HCC)   No adverse effects with mounjaro 5mg  No neuropathy Advised to schedule Dm eye exam Previous LDL at goal with crestor  Normal previous UACr Current use of GLP-1RA and ARB  Repeat hgbA1c, CMP, lipid panel and UACr Advised to maintain daily exercise and heart healthy diet F/up in 3months       Relevant Medications   tirzepatide (MOUNJARO) 5 MG/0.5ML Pen   losartan  (COZAAR ) 100 MG tablet   Other Relevant Orders   Microalbumin / creatinine urine ratio   Hemoglobin A1c   Comprehensive metabolic panel with GFR   Hyperlipidemia associated with type 2 diabetes mellitus (HCC)   Previous LDL at goal with crestor  10mg  Repeat CMP and lipid panel       Relevant Medications   tirzepatide (MOUNJARO) 5 MG/0.5ML Pen   losartan  (COZAAR ) 100 MG tablet   triamterene -hydrochlorothiazide (MAXZIDE) 75-50 MG tablet   Other Relevant Orders   Comprehensive metabolic panel with GFR   Lipid panel   Hypertensive disorder   Relevant Medications   losartan  (COZAAR ) 100 MG tablet   triamterene -hydrochlorothiazide (MAXZIDE) 75-50 MG tablet   Microcytic anemia   UTD with colonoscopy Postmenopause  Check cbc and iron panel      Relevant Orders   CBC   IBC + Ferritin   Other Visit Diagnoses       Essential hypertension       Relevant Medications  losartan  (COZAAR ) 100 MG tablet   triamterene -hydrochlorothiazide (MAXZIDE) 75-50 MG tablet      Return in about 6 months (around 08/20/2024) for CPE (fasting).     Kathrene Parents, NP

## 2024-02-18 NOTE — Patient Instructions (Addendum)
 Schedule fasting lab appt. Need to be fasting 8hrs prior to blood draw. Ok to drink water and take BP meds. Maintain Heart healthy diet and daily exercise. Maintain current medications. Schedule annual eye exam

## 2024-02-19 ENCOUNTER — Encounter: Payer: Self-pay | Admitting: Nurse Practitioner

## 2024-02-19 MED ORDER — LOSARTAN POTASSIUM 100 MG PO TABS: 100.0000 mg | ORAL_TABLET | Freq: Every day | ORAL | 0 refills | Status: DC

## 2024-02-19 MED ORDER — TRIAMTERENE-HCTZ 75-50 MG PO TABS: 0.5000 | ORAL_TABLET | ORAL | 1 refills | Status: DC

## 2024-02-19 NOTE — Assessment & Plan Note (Signed)
 Previous LDL at goal with crestor  10mg  Repeat CMP and lipid panel

## 2024-02-19 NOTE — Assessment & Plan Note (Signed)
 UTD with colonoscopy Postmenopause  Check cbc and iron panel

## 2024-02-19 NOTE — Assessment & Plan Note (Signed)
 No adverse effects with mounjaro 5mg  No neuropathy Advised to schedule Dm eye exam Previous LDL at goal with crestor  Normal previous UACr Current use of GLP-1RA and ARB  Repeat hgbA1c, CMP, lipid panel and UACr Advised to maintain daily exercise and heart healthy diet F/up in 3months

## 2024-02-19 NOTE — Assessment & Plan Note (Signed)
 Stable with amitiza  despite use of mounjaro

## 2024-02-25 ENCOUNTER — Encounter: Payer: Self-pay | Admitting: Gastroenterology

## 2024-02-25 ENCOUNTER — Ambulatory Visit: Admitting: Gastroenterology

## 2024-02-25 DIAGNOSIS — K219 Gastro-esophageal reflux disease without esophagitis: Secondary | ICD-10-CM

## 2024-02-25 DIAGNOSIS — K449 Diaphragmatic hernia without obstruction or gangrene: Secondary | ICD-10-CM | POA: Diagnosis not present

## 2024-02-25 DIAGNOSIS — R1032 Left lower quadrant pain: Secondary | ICD-10-CM

## 2024-02-25 DIAGNOSIS — R0602 Shortness of breath: Secondary | ICD-10-CM

## 2024-02-25 DIAGNOSIS — D509 Iron deficiency anemia, unspecified: Secondary | ICD-10-CM

## 2024-02-25 DIAGNOSIS — Z860101 Personal history of adenomatous and serrated colon polyps: Secondary | ICD-10-CM

## 2024-02-25 DIAGNOSIS — K2270 Barrett's esophagus without dysplasia: Secondary | ICD-10-CM | POA: Diagnosis not present

## 2024-02-25 DIAGNOSIS — R1013 Epigastric pain: Secondary | ICD-10-CM

## 2024-02-25 DIAGNOSIS — K581 Irritable bowel syndrome with constipation: Secondary | ICD-10-CM

## 2024-02-25 MED ORDER — NA SULFATE-K SULFATE-MG SULF 17.5-3.13-1.6 GM/177ML PO SOLN: 1.0000 | ORAL | 0 refills | Status: DC

## 2024-02-25 NOTE — Patient Instructions (Addendum)
 _______________________________________________________  If your blood pressure at your visit was 140/90 or greater, please contact your primary care physician to follow up on this.  If you are age 62 or younger, your body mass index should be between 19-25. Your Body mass index is 42.38 kg/m. If this is out of the aformentioned range listed, please consider follow up with your Primary Care Provider.  ________________________________________________________  The [facility] providers would like to encourage you to use MYCHART to communicate with providers for non-urgent requests or questions.  Due to long hold times on the telephone, sending your provider a message by [facility] may be a faster and more efficient way to get a response.  Please allow 48 business hours for a response.  Please remember that this is for non-urgent requests.  _______________________________________________________  Mackenzie Weber have been scheduled for an endoscopy and colonoscopy. Please follow the written instructions given to you at your visit today.  If you use inhalers (even only as needed), please bring them with you on the day of your procedure.  DO NOT TAKE 7 DAYS PRIOR TO TEST- Trulicity (dulaglutide) Ozempic , Wegovy  (semaglutide ) Mounjaro (tirzepatide) Bydureon Bcise (exanatide extended release)  DO NOT TAKE 1 DAY PRIOR TO YOUR TEST Rybelsus  (semaglutide ) Adlyxin (lixisenatide) Victoza (liraglutide ) Byetta (exanatide) ___________________________________________________________________________  Due to recent changes in healthcare laws, you may see the results of your imaging and laboratory studies on MyChart before your provider has had a chance to review them.  We understand that in some cases there may be results that are confusing or concerning to you. Not all laboratory results come back in the same time frame and the provider may be waiting for multiple results in order to interpret others.  Please give us  48  hours in order for your provider to thoroughly review all the results before contacting the office for clarification of your results.   Thank you for entrusting me with your care and choosing [facility].  Dr Venice Gillis

## 2024-02-25 NOTE — Progress Notes (Signed)
 Chief Complaint: GI eval  Referring Provider:  Kandace Organ, NP      ASSESSMENT AND PLAN;   #1. GERD with small HH with Barrett's on GE Jn Bx.  #2. H/O colonic polyps.  #3. IBS-C. Neg colon 06/2019, rpt colon due 06/2024 d/t H/O adenomatous polyps.  #4. Longstanding microcytic anemia with heme neg stools. ?Hemoglobinopathy. Neg colon 06/2019   Plan: -EGD/colon with 2 day prep. Hold Monjuaro 1 week before -Continue protonix  40mg  po QD -Metamucil to continue  -Continue Miralax prn -Encouraged her to lose weight.     I discussed EGD/Colonoscopy- the indications, risks, alternatives and potential complications including, but not limited to bleeding, infection, reaction to meds, damage to internal organs, cardiac and/or pulmonary problems, and perforation requiring surgery. The possibility that significant findings could be missed was explained. All ? were answered. Pt consents to proceed.  HPI:    Mackenzie Weber is a 63 y.o. female  GERD, DM2, obesity, SB adhesions, S/P cholecystectomy, TAH with BSO  Discussed the use of AI scribe software for clinical note transcription with the patient, who gave verbal consent to proceed.  History of Present Illness Mackenzie Weber "Mackenzie Weber" is a 63 year old female with Barrett's esophagus who presents for follow-up and evaluation of her gastrointestinal conditions.  She is here for follow-up regarding her Barrett's esophagus and history of polyps. A colonoscopy in September 2020 revealed polyps, with a recommendation to repeat it in five years. An endoscopy in 2022 confirmed Barrett's esophagus by biopsy. Her reflux symptoms have improved with dietary modifications, such as avoiding eating before bed.  She is currently taking Protonix  once a day, which is effective. Initially, she experienced voice issues, later attributed to acid reflux. She has not undergone a sleep test and is unsure if she has sleep apnea, although her BMI is  42.  For constipation, she occasionally uses magnesium  Celtic water and Miralax, especially when she was on Ozempic , which slowed her bowel movements. She is now taking Mounjaro once a week and has noticed it is better than Ozempic . She has lost about six to seven pounds since starting Mounjaro.  Her hemoglobin has been low. She experiences fatigue but believes it is due to her busy schedule rather than sleep apnea. She plans to improve her exercise routine, which was disrupted when her husband had surgery. Her knee has been sore, affecting her ability to walk and ride a bicycle, but she intends to gradually resume these activities to alleviate stiffness.     From previous notes: With multiple GI problems  -Continues to have heartburn with postprandial abdominal bloating.  She stopped taking Nexium  on her own.  This did result in worsening of symptoms.  Also has shortness of breath after eating.  She has tried Protonix  in past.  Most recent EGD as below showed small hiatal hernia, short segment Barrett's esophagus.  She denies having any odynophagia or dysphagia.  Does have postnasal drip and sinus problems.  Had seen ENT at [facility] few years ago.  No nausea or vomiting. H/O heartburn x 20 years.  -Longstanding history of constipation with pellet-like stools, negative colonoscopy 06/2019 as below.  It was recommended to repeat colonoscopy in 5 years due to previous history of tubular adenomas.  Now with more lower abdominal pain especially LLQ.  She denies having any fever but has occasional chills.  The abdominal pain is getting worse.  She does take Metamucil every day.  -Has gained more weight.  Not sure if she has sleep apnea but she does snore.  Family history of colon cancer in a distant second-degree relative.  She always had low hemoglobin with significant microcytosis.  In 2013 her hemoglobin was 10.7 with MCV 66.  Most recently her hemoglobin was 11 with MCV 67.  She had  heme-negative stools.  Wt Readings from Last 3 Encounters:  02/25/24 217 lb (98.4 kg)  02/18/24 217 lb 6.4 oz (98.6 kg)  11/25/23 219 lb 9.6 oz (99.6 kg)       Past GI work-up:  Colonoscopy 06/24/2019 (Dr Andra Banco) -Good prep -Small internal hemorrhoids -Otherwise normal colonoscopy. -Repeat in 5 years due to previous H/O adenomatous polyps.  EGD 04/12/2021 - Z-line minimally irregular, 35 cm from the incisors. Biopsied. Rpt 3 yrs - Small transient hiatal hernia. 1. Surgical [P], duodenal biopsies - DUODENAL MUCOSA WITH NORMAL VILLOUS ARCHITECTURE. - NO VILLOUS ATROPHY OR INCREASED INTRAEPITHELIAL LYMPHOCYTES. 2. Surgical [P], gastric biopsies - UNREMARKABLE ANTRAL AND OXYNTIC MUCOSA. Jhon Moselle NEGATIVE FOR HELICOBACTER PYLORI. - NO INTESTINAL METAPLASIA, DYSPLASIA OR CARCINOMA. 3. Surgical [P], distal esophagus - GASTROESOPHAGEAL MUCOSA WITH INTESTINAL METAPLASIA CONSISTENT WITH BARRETT'S ESOPHAGUS. - NO DYSPLASIA OR CARCINOMA.  EGD 05/12/2018 -Small HH -GE junction biopsies with intestinal metaplasia consistent with Barrett's. 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  No dysplasia. -Repeat EGD in 3 years.  CT AP with contrast 01/2011 1.  No acute abnormality on CT of the abdomen and pelvis.  2.  No renal or ureteral calculi.  3.  The terminal ileum is well seen and appears normal.  The  patient has previously undergone appendectomy and hysterectomy.  No  adnexal lesion is seen.  4.  Probable left parapelvic cyst along the inferior pelvis but the  attenuation is slightly higher than would be expected.  Consider  renal ultrasound to assess further.  Op note 10/1996: L salpingo-oophorectomy with partial SB resection d/t extensive adhesions, and appendicectomy.     Past Medical History:  Diagnosis Date   Anemia    Asthma    Diabetes mellitus without complication (HCC)    Foot pain, right    Hypertension    IBS  (irritable bowel syndrome)    Sciatica of left side    Seasonal allergies     Past Surgical History:  Procedure Laterality Date   ABDOMINAL HYSTERECTOMY     APPENDECTOMY     BLADDER SUSPENSION  2012   TVT   BREAST CYST EXCISION     BREAST SURGERY  2001   /biopsy-benign   CHOLECYSTECTOMY     COLONOSCOPY  06/24/2019   [facility]   ESOPHAGOGASTRODUODENOSCOPY  05/12/2018   [facility]    EXCISIONAL HEMORRHOIDECTOMY     KNEE SURGERY     right   SHOULDER SURGERY Left 02/13/2018   Lipoma removed    Small Bowel Surgery     TONSILLECTOMY     TOTAL ABDOMINAL HYSTERECTOMY W/ BILATERAL SALPINGOOPHORECTOMY  1989   LSO-1987; RsO-1998   WRIST SURGERY     carpal tunnel repair    Family History  Problem Relation Age of Onset   Diabetes Mother    Hypertension Mother    Asthma Mother    Heart disease Mother    Allergic rhinitis Mother    Diabetes Father    Hypertension Father    Heart disease Father    Hypertension Sister    Diabetes Sister    Breast cancer Sister 17   Colon  cancer Maternal Uncle    Hypertension Paternal Uncle    Diabetes Brother    Pancreatic cancer Neg Hx    Stomach cancer Neg Hx    Esophageal cancer Neg Hx    Liver disease Neg Hx     Social History   Tobacco Use   Smoking status: Never    Passive exposure: Never   Smokeless tobacco: Never  Vaping Use   Vaping status: Never Used  Substance Use Topics   Alcohol use: No   Drug use: No    Current Outpatient Medications  Medication Sig Dispense Refill   albuterol  (VENTOLIN  HFA) 108 (90 Base) MCG/ACT inhaler Inhale 2 puffs into the lungs every 4 (four) hours as needed for wheezing or shortness of breath. 18 g 1   Albuterol -Budesonide  (AIRSUPRA ) 90-80 MCG/ACT AERO Inhale 2 puffs into the lungs as needed (maximum 12 puffs/day). 10.7 g 3   azelastine  (ASTELIN ) 0.1 % nasal spray 1-2 sprays  each nostril twice daily as needed 90 mL 1   blood glucose meter kit and supplies KIT Check blood sugar once  daily. One Touch Verio Reflect Meter 1 each 0   Budeson-Glycopyrrol-Formoterol  (BREZTRI  AEROSPHERE) 160-9-4.8 MCG/ACT AERO Inhale 2 puffs into the lungs daily. Use with spacer. Rinse, gargle and spit out after use. 10.7 g 5   clobetasol ointment (TEMOVATE) 0.05 % Apply 1 Application topically 2 (two) times daily. For 7 days     EPINEPHrine  (AUVI-Q ) 0.3 mg/0.3 mL IJ SOAJ injection Inject 0.3 mg into the muscle as needed for anaphylaxis. 2 each 1   estradiol  (ESTRACE ) 0.1 MG/GM vaginal cream Place 1 Applicatorful vaginally once a week.     famotidine  (PEPCID ) 20 MG tablet TAKE 1 TABLET BY MOUTH AT BEDTIME 30 tablet 5   fluticasone  (FLONASE ) 50 MCG/ACT nasal spray 2 sprays in each nostril once a day as needed for a stuffy nose 16 g 5   glucose blood (ONETOUCH VERIO) test strip Use as instructed 100 each 0   ipratropium (ATROVENT ) 0.06 % nasal spray Place 2 sprays into both nostrils 4 (four) times daily as needed for rhinitis. 15 mL 5   Lancets (ONETOUCH DELICA PLUS LANCET33G) MISC USE 1  TO CHECK GLUCOSE ONCE DAILY FOR BLOOD SUGAR 100 each 0   losartan  (COZAAR ) 100 MG tablet Take 1 tablet (100 mg total) by mouth daily. 90 tablet 0   lubiprostone  (AMITIZA ) 8 MCG capsule Take 1 capsule (8 mcg total) by mouth 2 (two) times daily with a meal. 60 capsule 5   metFORMIN  (GLUCOPHAGE -XR) 500 MG 24 hr tablet Take 1 tablet by mouth once daily with breakfast 90 tablet 3   Multiple Vitamin (MULTIVITAMIN) capsule Take 1 capsule by mouth daily.     pantoprazole  (PROTONIX ) 20 MG tablet Take 2 tablets (40 mg total) by mouth daily. 90 tablet 1   rosuvastatin  (CRESTOR ) 10 MG tablet Take 1 tablet (10 mg total) by mouth daily. 90 tablet 3   tirzepatide (MOUNJARO) 5 MG/0.5ML Pen Inject 5 mg into the skin once a week. 6 mL 1   triamterene -hydrochlorothiazide (MAXZIDE) 75-50 MG tablet Take 0.5 tablets by mouth every other day. 45 tablet 1   No current facility-administered medications for this visit.    Allergies   Allergen Reactions   Aspirin Anxiety and Other (See Comments)    Rapid Heart beat    Review of Systems:  neg     Physical Exam:    BP 136/80   Pulse 67   Ht 5' (  1.524 m)   Wt 217 lb (98.4 kg)   SpO2 97%   BMI 42.38 kg/m  Wt Readings from Last 3 Encounters:  02/25/24 217 lb (98.4 kg)  02/18/24 217 lb 6.4 oz (98.6 kg)  11/25/23 219 lb 9.6 oz (99.6 kg)   Constitutional:  Well-developed, in no acute distress. Psychiatric: Normal mood and affect. Behavior is normal. HEENT: Pupils normal.  Conjunctivae are normal. No scleral icterus. Neck supple.  Cardiovascular: Normal rate, regular rhythm. No edema Pulmonary/chest: Effort normal and breath sounds normal. No wheezing, rales or rhonchi. Abdominal: Soft, nondistended. LLQ tenderness without rebound. Bowel sounds active throughout. There are no masses palpable. No hepatomegaly. Rectal: Deferred Neurological: Alert and oriented to person place and time. Skin: Skin is warm and dry. No rashes noted.  Data Reviewed: I have personally reviewed following labs and imaging studies  CBC:    Latest Ref Rng & Units 11/16/2021   11:41 AM 11/08/2021    8:24 AM 11/04/2021    4:24 AM  CBC  WBC 4.0 - 10.5 K/uL 6.1  4.6  8.9   Hemoglobin 12.0 - 15.0 g/dL 16.1  09.6  04.5   Hematocrit 36.0 - 46.0 % 36.7  33.4  35.5   Platelets 150.0 - 400.0 K/uL 328.0  327  324     CMP:    Latest Ref Rng & Units 08/22/2023    2:57 PM 02/13/2023    9:41 AM 08/16/2022   10:14 AM  CMP  Glucose 70 - 99 mg/dL 86  85  89   BUN 6 - 23 mg/dL 17  16  14    Creatinine 0.40 - 1.20 mg/dL 4.09  8.11  9.14   Sodium 135 - 145 mEq/L 139  141  141   Potassium 3.5 - 5.1 mEq/L 3.8  4.0  3.6   Chloride 96 - 112 mEq/L 104  104  104   CO2 19 - 32 mEq/L 27  29  26    Calcium  8.4 - 10.5 mg/dL 9.9  9.7  9.7   Total Protein 6.0 - 8.3 g/dL  6.8  7.3   Total Bilirubin 0.2 - 1.2 mg/dL  0.3  0.4   Alkaline Phos 39 - 117 U/L  54  57   AST 0 - 37 U/L  12  15   ALT 0 - 35 U/L  11   11         Magnus Schuller, MD 02/25/2024, 4:12 PM  Cc: Kandace Organ, NP

## 2024-03-20 ENCOUNTER — Other Ambulatory Visit (INDEPENDENT_AMBULATORY_CARE_PROVIDER_SITE_OTHER)

## 2024-03-20 DIAGNOSIS — E1169 Type 2 diabetes mellitus with other specified complication: Secondary | ICD-10-CM

## 2024-03-20 DIAGNOSIS — E785 Hyperlipidemia, unspecified: Secondary | ICD-10-CM | POA: Diagnosis not present

## 2024-03-20 DIAGNOSIS — D509 Iron deficiency anemia, unspecified: Secondary | ICD-10-CM

## 2024-03-20 LAB — CBC
HCT: 36.6 % (ref 36.0–46.0)
Hemoglobin: 11.4 g/dL — ABNORMAL LOW (ref 12.0–15.0)
MCHC: 31.1 g/dL (ref 30.0–36.0)
MCV: 67.4 fl — ABNORMAL LOW (ref 78.0–100.0)
Platelets: 272 10*3/uL (ref 150.0–400.0)
RBC: 5.43 Mil/uL — ABNORMAL HIGH (ref 3.87–5.11)
RDW: 16 % — ABNORMAL HIGH (ref 11.5–15.5)
WBC: 5.1 10*3/uL (ref 4.0–10.5)

## 2024-03-20 LAB — COMPREHENSIVE METABOLIC PANEL WITH GFR
ALT: 15 U/L (ref 0–35)
AST: 16 U/L (ref 0–37)
Albumin: 3.9 g/dL (ref 3.5–5.2)
Alkaline Phosphatase: 60 U/L (ref 39–117)
BUN: 17 mg/dL (ref 6–23)
CO2: 27 meq/L (ref 19–32)
Calcium: 9.4 mg/dL (ref 8.4–10.5)
Chloride: 105 meq/L (ref 96–112)
Creatinine, Ser: 0.96 mg/dL (ref 0.40–1.20)
GFR: 63.6 mL/min (ref >60.00)
Glucose, Bld: 92 mg/dL (ref 70–99)
Potassium: 3.8 meq/L (ref 3.5–5.1)
Sodium: 140 meq/L (ref 135–145)
Total Bilirubin: 0.3 mg/dL (ref 0.2–1.2)
Total Protein: 7 g/dL (ref 6.0–8.3)

## 2024-03-20 LAB — HEMOGLOBIN A1C: Hgb A1c MFr Bld: 6.6 % — ABNORMAL HIGH (ref 4.6–6.5)

## 2024-03-20 LAB — LIPID PANEL
Cholesterol: 129 mg/dL (ref 0–200)
HDL: 56.2 mg/dL (ref >39.00)
LDL Cholesterol: 58 mg/dL (ref 0–99)
NonHDL: 73.2
Total CHOL/HDL Ratio: 2
Triglycerides: 76 mg/dL (ref 0.0–149.0)
VLDL: 15.2 mg/dL (ref 0.0–40.0)

## 2024-03-20 LAB — MICROALBUMIN / CREATININE URINE RATIO
Creatinine,U: 161.7 mg/dL
Microalb Creat Ratio: 4.7 mg/g (ref 0.0–30.0)
Microalb, Ur: 0.8 mg/dL (ref 0.0–1.9)

## 2024-03-26 ENCOUNTER — Ambulatory Visit: Payer: Self-pay | Admitting: Nurse Practitioner

## 2024-03-26 LAB — IBC + FERRITIN
Ferritin: 44.4 ng/mL (ref 10.0–291.0)
Iron: 44 ug/dL (ref 42–145)
Saturation Ratios: 15.7 % — ABNORMAL LOW (ref 20.0–50.0)
TIBC: 280 ug/dL (ref 250.0–450.0)
Transferrin: 200 mg/dL — ABNORMAL LOW (ref 212.0–360.0)

## 2024-04-07 ENCOUNTER — Encounter: Payer: Self-pay | Admitting: Allergy & Immunology

## 2024-04-07 ENCOUNTER — Ambulatory Visit: Admitting: Allergy & Immunology

## 2024-04-07 ENCOUNTER — Other Ambulatory Visit: Payer: Self-pay

## 2024-04-07 VITALS — BP 120/74 | HR 78 | Temp 98.2°F | Resp 18 | Ht 59.06 in | Wt 220.3 lb

## 2024-04-07 DIAGNOSIS — K219 Gastro-esophageal reflux disease without esophagitis: Secondary | ICD-10-CM | POA: Diagnosis not present

## 2024-04-07 DIAGNOSIS — L508 Other urticaria: Secondary | ICD-10-CM | POA: Diagnosis not present

## 2024-04-07 DIAGNOSIS — J302 Other seasonal allergic rhinitis: Secondary | ICD-10-CM

## 2024-04-07 DIAGNOSIS — J3089 Other allergic rhinitis: Secondary | ICD-10-CM | POA: Diagnosis not present

## 2024-04-07 DIAGNOSIS — J4540 Moderate persistent asthma, uncomplicated: Secondary | ICD-10-CM

## 2024-04-07 HISTORY — DX: Morbid (severe) obesity due to excess calories: E66.01

## 2024-04-07 MED ORDER — GUAIFENESIN-CODEINE 100-10 MG/5ML PO SOLN: 10.0000 mL | Freq: Three times a day (TID) | ORAL | 0 refills | Status: DC | PRN

## 2024-04-07 NOTE — Progress Notes (Signed)
 FOLLOW UP  Date of Service/Encounter:  04/07/24   Assessment:   Moderate persistent asthma, uncomplicated - with improved control since using her medications as directed   Perennial and seasonal allergic rhinitis - previously on allergen immunotherapy but now stopping   GERD - on Nexium  and Pepcid  (followed by Dr. 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 Moderate persistent asthma without complication - Spirometry today showed  - I would strongly consider starting the Tezspire (at least for 2-3 times) to see if there is any improvement in symptoms.   - Daily controller medication(s): Breztri  two puffs TWICE daily - Prior to physical activity: AiurSupra two puffs 10-15 minutes before physical activity. - Rescue medications: AirSupra  2-4 puffs every 4-6 hours as needed - Asthma control goals:  * Full participation in all desired activities (may need albuterol  before activity) * Albuterol  use two time or less a week on average (not counting use with activity) * Cough interfering with sleep two time or less a month * Oral steroids no more than once a year * No hospitalizations  2. Perennial and seasonal allergic rhinitis - Continue with the nasal saline lavage as needed.  - Continue with Allegra 180mg  daily.  - Continue with the Xyzal  5mg  daily.   3. Gastroesophageal reflux disease - Continue with Protonix  and Pepcid  at least once daily.   4. Return in about 3 months (around 07/08/2024). You can have the follow up appointment with Dr. Iva or a Nurse Practicioner (our Nurse Practitioners are excellent and always have Physician oversight!).   Subjective:   Mackenzie Weber is a 63 y.o. female presenting today for follow up of  Chief Complaint  Patient presents with   Cough    Started about thur or Friday and has some drainage with cough. Took xzyal yesterday, stated it helped some.    Mackenzie Weber has a history of the  following: Patient Active Problem List   Diagnosis Date Noted   Hyperlipidemia associated with type 2 diabetes mellitus (HCC) 11/25/2023   Moderate persistent asthma with acute exacerbation 10/14/2023   Chronic idiopathic constipation 08/22/2023   GAD (generalized anxiety disorder) 12/15/2021   Allergic conjunctivitis of both eyes 11/16/2020   Allergic conjunctivitis 05/30/2020   Not well controlled moderate persistent asthma 05/30/2020   Grade I diastolic dysfunction 03/18/2020   Bilateral foot pain 01/13/2020   Extensor tendonitis of foot 01/13/2020   DM (diabetes mellitus) (HCC) 01/01/2019   Seasonal and perennial allergic rhinitis 05/20/2018   Chronic bilateral low back pain with left-sided sciatica 05/20/2018   Lipoma of left upper extremity 01/29/2018   Muscle spasm of left lower extremity 01/29/2018   Intestinal ulcer 04/23/2017   Bilateral lower extremity edema 04/23/2017   GERD (gastroesophageal reflux disease) 03/28/2016   History of colon polyps 03/28/2016   Microcytic anemia 03/28/2016   Barrett's esophagus 03/28/2016   Somatic dysfunction of cervical region 11/16/2015   Chronic sciatica 01/28/2015   Somatic dysfunction of pelvic region 01/28/2015   Chronic pelvic pain in female 01/28/2015   Somatic dysfunction of lower extremity 01/28/2015   Somatic dysfunction of sacral region 01/28/2015   Hypertensive disorder 10/22/2014   Obesity, morbid (HCC) 10/22/2014   Abdominal muscle strain 06/07/2014   Sciatica of left side 11/03/2012    History obtained from: chart review and patient.  Discussed the use of AI scribe software for clinical note transcription with the patient and/or guardian, who gave verbal consent  to proceed.  [name] is a 63 y.o. female presenting for a follow up visit.  She was last seen in March 2025.  At that time, lung testing looks stable, but not great.  We talked about adding Tezspire in the future.  We continue with Breztri  2 puffs twice daily and  AirSupra  twice daily as needed.  For her allergic rhinitis, we continued with nasal saline as well as Allegra.  We also continued with Protonix  and Pepcid .  Since last visit, she has been relatively stable.   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 The cough is particularly bothersome at night and worsened after touching dusty walls. Initially, she thought it was acid reflux but later realized it was due to mucus production. She has been using her Breztri  inhaler and notes improvement in her symptoms with its use. She also used [employer] on Saturday and typically uses it every two weeks.  She describes her role in cleaning and maintaining her church, noting that she is sensitive to dust and mold, which are present in the older building. She has taken steps to clean and improve the environment, such as mopping floors and walls and adding carpet pieces.   Allergic Rhinitis Symptom History: She has a history of allergies and has been on allergy shots for two years, though she does not feel they have significantly helped. She uses Allegra and Xyzal  regularly for her allergies and does not experience drowsiness from Xyzal . She also has Mucinex  and Claritin available for use.  She reports ringing in her ears but denies dizziness or vertigo. Her ears have been ringing and she had dizziness in the past, specifically in February.  Her feet have been swelling, affecting her ability to exercise, though she continues to try walking. She experiences tendinitis issues but remains determined to stay active.  Otherwise, there have been no changes to her past medical history, surgical history, family history, or social history.    Review of systems otherwise negative other than that mentioned in the HPI.    Objective:   Blood pressure 120/74, pulse 78, temperature 98.2 F (36.8 C),  temperature source Temporal, resp. rate 18, height 4' 11.06 (1.5 m), weight 220 lb 4.8 oz (99.9 kg), SpO2 98%. Body mass index is 44.41 kg/m.    Physical Exam Vitals reviewed.  Constitutional:      Appearance: She is well-developed.     Comments: Boisterous. Delightful as always. Bubbly.   HENT:     Head: Normocephalic and atraumatic.     Right Ear: Tympanic membrane, ear canal and external ear normal.     Left Ear: Tympanic membrane, ear canal and external ear normal.     Nose: No nasal deformity, septal deviation, mucosal edema or rhinorrhea.     Right Turbinates: Enlarged, swollen and pale.     Left Turbinates: Enlarged, swollen and pale.     Right Sinus: No maxillary sinus tenderness or frontal sinus tenderness.     Left Sinus: No maxillary sinus tenderness or frontal sinus tenderness.     Comments: No polyps. No rhinorrhea today.     Mouth/Throat:     Mouth: Mucous membranes are not pale and not dry.     Pharynx: Uvula midline.   Eyes:     General: Lids are normal. Allergic shiner present.        Right eye: No discharge.  Left eye: No discharge.     Conjunctiva/sclera: Conjunctivae normal.     Right eye: Right conjunctiva is not injected. No chemosis.    Left eye: Left conjunctiva is not injected. No chemosis.    Pupils: Pupils are equal, round, and reactive to light.    Cardiovascular:     Rate and Rhythm: Normal rate and regular rhythm.     Heart sounds: Normal heart sounds.  Pulmonary:     Effort: No tachypnea, accessory muscle usage, respiratory distress or retractions.     Breath sounds: No decreased breath sounds, wheezing, rhonchi or rales.  Chest:     Chest wall: No tenderness.  Lymphadenopathy:     Cervical: No cervical adenopathy.   Skin:    General: Skin is warm.     Capillary Refill: Capillary refill takes less than 2 seconds.     Coloration: Skin is not pale.     Findings: No abrasion, erythema, petechiae or rash. Rash is not papular,  urticarial or vesicular.   Neurological:     Mental Status: She is alert.   Psychiatric:        Behavior: Behavior is cooperative.      Diagnostic studies:    Spirometry: results normal (FEV1: 1.15/67%, FVC: 1.61/76%, FEV1/FVC: 71%).    Spirometry consistent with normal pattern.    Allergy Studies: none       Marty Shaggy, MD  [facility] 

## 2024-04-07 NOTE — Patient Instructions (Addendum)
 1. Moderate persistent asthma without complication - Spirometry today showed  - I would strongly consider starting the Tezspire (at least for 2-3 times) to see if there is any improvement in symptoms.   - Daily controller medication(s): Breztri  two puffs TWICE daily - Prior to physical activity: AiurSupra two puffs 10-15 minutes before physical activity. - Rescue medications: AirSupra  2-4 puffs every 4-6 hours as needed - Asthma control goals:  * Full participation in all desired activities (may need albuterol  before activity) * Albuterol  use two time or less a week on average (not counting use with activity) * Cough interfering with sleep two time or less a month * Oral steroids no more than once a year * No hospitalizations  2. Perennial and seasonal allergic rhinitis - Continue with the nasal saline lavage as needed.  - Continue with Allegra 180mg  daily.  - Continue with the Xyzal  5mg  daily.   3. Gastroesophageal reflux disease - Continue with Protonix  and Pepcid  at least once daily.   4. Return in about 3 months (around 07/08/2024). You can have the follow up appointment with Dr. Iva or a Nurse Practicioner (our Nurse Practitioners are excellent and always have Physician oversight!).    Please inform us  of any Emergency Department visits, hospitalizations, or changes in symptoms. Call us  before going to the ED for breathing or allergy symptoms since we might be able to fit you in for a sick visit. Feel free to contact us  anytime with any questions, problems, or concerns.  It was a pleasure to see you again today!  Websites that have reliable patient information: 1. American Academy of Asthma, Allergy, and Immunology: www.aaaai.org 2. Food Allergy Research and Education (FARE): foodallergy.org 3. Mothers of Asthmatics: http://www.asthmacommunitynetwork.org 4. American College of Allergy, Asthma, and Immunology: www.acaai.org      "Like" us  on Facebook and Instagram for  our latest updates!      A healthy democracy works best when [employer] participate! Make sure you are registered to vote! If you have moved or changed any of your contact information, you will need to get this updated before voting! Scan the QR codes below to learn more!

## 2024-04-15 ENCOUNTER — Telehealth: Payer: Self-pay | Admitting: Nurse Practitioner

## 2024-04-15 DIAGNOSIS — Z111 Encounter for screening for respiratory tuberculosis: Secondary | ICD-10-CM

## 2024-04-15 DIAGNOSIS — Z0184 Encounter for antibody response examination: Secondary | ICD-10-CM

## 2024-04-15 DIAGNOSIS — Z23 Encounter for immunization: Secondary | ICD-10-CM

## 2024-04-15 NOTE — Telephone Encounter (Signed)
 Patient dropped off document Immunization form, to be filled out by provider. Patient requested to send it back via Call Patient to pick up within ASAP. Document is located in providers tray at front office.Please advise at Mobile [phone] (mobile)

## 2024-04-16 ENCOUNTER — Other Ambulatory Visit (INDEPENDENT_AMBULATORY_CARE_PROVIDER_SITE_OTHER)

## 2024-04-16 ENCOUNTER — Ambulatory Visit (INDEPENDENT_AMBULATORY_CARE_PROVIDER_SITE_OTHER)

## 2024-04-16 DIAGNOSIS — Z23 Encounter for immunization: Secondary | ICD-10-CM | POA: Diagnosis not present

## 2024-04-16 DIAGNOSIS — Z111 Encounter for screening for respiratory tuberculosis: Secondary | ICD-10-CM

## 2024-04-16 DIAGNOSIS — Z0184 Encounter for antibody response examination: Secondary | ICD-10-CM

## 2024-04-16 NOTE — Telephone Encounter (Signed)
 Patient is willing to do the labs

## 2024-04-16 NOTE — Progress Notes (Signed)
 After obtaining consent, and per orders of  Roselie Mood, NP, injection of Tdap given by Roberts Finder, CMA Patient instructed to remain in clinic for 20 minutes afterwards, and to report any adverse reaction to me immediately.

## 2024-04-16 NOTE — Telephone Encounter (Signed)
 Called patient to inform her that I did get her employee health form for vaccines. I asked if she was born out of [place] due to what I was able to see is the COVID vaccines from 2021/2022 and the pneumo poly 23 vaccine from 2021. She informed me that it she was born in [place] and her previous doctor had passed away and that she received some vaccines from the local health department. I informed her that I will send a request for vaccine history to them and I asked what [facility] did she have the pneumo ply 23 vaccine from. She informed me that it was given at the the [employer] location in [place]. I informed her that I will do everything that I can to get the needed information and that I will confirm with Roselie if she may hav eto come in for an appointment to have titer labs. She thanked me for calling her back and stated that she will also help due to the form is due to be turned in on Monday 04/20/24

## 2024-04-20 ENCOUNTER — Ambulatory Visit: Payer: Self-pay | Admitting: Nurse Practitioner

## 2024-04-20 DIAGNOSIS — Z0279 Encounter for issue of other medical certificate: Secondary | ICD-10-CM

## 2024-04-20 LAB — MEASLES/MUMPS/RUBELLA IMMUNITY
Mumps IgG: 55.3 [AU]/ml
Rubella: 6.86 {index}
Rubeola IgG: 76.3 [AU]/ml

## 2024-04-20 LAB — QUANTIFERON-TB GOLD PLUS
Mitogen-NIL: >10 [IU]/mL
NIL: 0.05 [IU]/mL
QuantiFERON-TB Gold Plus: NEGATIVE
TB1-NIL: <0 [IU]/mL
TB2-NIL: <0 [IU]/mL

## 2024-04-20 LAB — HEPATITIS B SURFACE ANTIBODY, QUANTITATIVE: Hep B S AB Quant (Post): <5 m[IU]/mL — ABNORMAL LOW (ref >=10)

## 2024-04-20 LAB — VARICELLA ZOSTER ANTIBODY, IGG: Varicella IgG: 4.35 {s_co_ratio}

## 2024-04-22 ENCOUNTER — Telehealth: Payer: Self-pay | Admitting: Nurse Practitioner

## 2024-04-22 NOTE — Telephone Encounter (Signed)
 Pt returned your call I told her you would call her again after lunch.

## 2024-04-23 NOTE — Telephone Encounter (Addendum)
 Patient returned my call. I informed patient that her vaccine form is completed and the lab results. I informed her that she can come pick it up form when available. She asked if I could fax the form to where it needed to go since she is out of town and mail the original to her address. She gave the the name and number to where the form needs to be faxed and I placed the original in the mail to patient's home address.

## 2024-04-30 ENCOUNTER — Encounter: Admitting: Gastroenterology

## 2024-05-21 ENCOUNTER — Telehealth: Payer: Self-pay | Admitting: Nurse Practitioner

## 2024-05-21 NOTE — Telephone Encounter (Signed)
 Called patient and informed her that I would need a fax number to fax to [facility] that I can not send it via email. She also stated that the dated needs to be added for the TB lab test and have the form faxed to the number previously given.

## 2024-05-21 NOTE — Telephone Encounter (Signed)
 Updated the form to include the date of the TB lab and faxed to [facility] at [phone].

## 2024-05-21 NOTE — Telephone Encounter (Signed)
 Copied from CRM #[id number]. Topic: General - Other >> May 21, 2024 10:20 AM Jayma L wrote: Reason for CRM: patient called in and said she needs her lab results sent via fax to [email or web address] please just asap .SABRA Said she had some vaccines recently and they can be sent over to her >> May 21, 2024 10:26 AM Jayma L wrote: Patient asking they be sent to her as well .[email or web address]

## 2024-05-27 ENCOUNTER — Encounter: Payer: Self-pay | Admitting: Gastroenterology

## 2024-05-30 ENCOUNTER — Encounter: Payer: Self-pay | Admitting: Gastroenterology

## 2024-06-02 LAB — HM DIABETES EYE EXAM

## 2024-06-03 ENCOUNTER — Telehealth: Payer: Self-pay | Admitting: Gastroenterology

## 2024-06-03 NOTE — Telephone Encounter (Signed)
 Returned the patient's phone call. Advised her on the prep instructions. She verbalized understanding.

## 2024-06-03 NOTE — Telephone Encounter (Signed)
 Inbound call from patient requesting a call to discuss prep instructions for tomorrow's procedure. States she misplaced instructions. Please advise, thank you

## 2024-06-04 ENCOUNTER — Ambulatory Visit (AMBULATORY_SURGERY_CENTER): Admitting: Gastroenterology

## 2024-06-04 ENCOUNTER — Encounter: Payer: Self-pay | Admitting: Gastroenterology

## 2024-06-04 VITALS — BP 169/88 | HR 73 | Temp 97.7°F | Resp 14 | Ht 59.5 in | Wt 224.6 lb

## 2024-06-04 DIAGNOSIS — K219 Gastro-esophageal reflux disease without esophagitis: Secondary | ICD-10-CM | POA: Diagnosis not present

## 2024-06-04 DIAGNOSIS — K449 Diaphragmatic hernia without obstruction or gangrene: Secondary | ICD-10-CM | POA: Diagnosis not present

## 2024-06-04 DIAGNOSIS — K2289 Other specified disease of esophagus: Secondary | ICD-10-CM

## 2024-06-04 DIAGNOSIS — Z1211 Encounter for screening for malignant neoplasm of colon: Secondary | ICD-10-CM | POA: Diagnosis present

## 2024-06-04 DIAGNOSIS — Z860100 Personal history of colon polyps, unspecified: Secondary | ICD-10-CM

## 2024-06-04 DIAGNOSIS — K581 Irritable bowel syndrome with constipation: Secondary | ICD-10-CM

## 2024-06-04 MED ORDER — SODIUM CHLORIDE 0.9 % IV SOLN: 500.0000 mL | INTRAVENOUS | Status: DC

## 2024-06-04 NOTE — Patient Instructions (Signed)
 Resume previous diet.  Continue present medications. Awaiting pathology results. Repeat colonoscopy in 10 years for screening purposes. Earlier, if any new problems or change in family history. Handouts provided on hiatal hernia and hemorrhoids.   YOU HAD AN ENDOSCOPIC PROCEDURE TODAY AT THE [facility]:   Refer to the procedure report that was given to you for any specific questions about what was found during the examination.  If the procedure report does not answer your questions, please call your gastroenterologist to clarify.  If you requested that your care partner not be given the details of your procedure findings, then the procedure report has been included in a sealed envelope for you to review at your convenience later.  YOU SHOULD EXPECT: Some feelings of bloating in the abdomen. Passage of more gas than usual.  Walking can help get rid of the air that was put into your GI tract during the procedure and reduce the bloating. If you had a lower endoscopy (such as a colonoscopy or flexible sigmoidoscopy) you may notice spotting of blood in your stool or on the toilet paper. If you underwent a bowel prep for your procedure, you may not have a normal bowel movement for a few days.  Please Note:  You might notice some irritation and congestion in your nose or some drainage.  This is from the oxygen used during your procedure.  There is no need for concern and it should clear up in a day or so.  SYMPTOMS TO REPORT IMMEDIATELY:  Following lower endoscopy (colonoscopy or flexible sigmoidoscopy):  Excessive amounts of blood in the stool  Significant tenderness or worsening of abdominal pains  Swelling of the abdomen that is new, acute  Fever of 100F or higher  Following upper endoscopy (EGD)  Vomiting of blood or coffee ground material  New chest pain or pain under the shoulder blades  Painful or persistently difficult swallowing  New shortness of breath  Fever of 100F or  higher  Black, tarry-looking stools  For urgent or emergent issues, a gastroenterologist can be reached at any hour by calling [phone]. Do not use MyChart messaging for urgent concerns.    DIET:  We do recommend a small meal at first, but then you may proceed to your regular diet.  Drink plenty of fluids but you should avoid alcoholic beverages for 24 hours.  ACTIVITY:  You should plan to take it easy for the rest of today and you should NOT DRIVE or use heavy machinery until tomorrow (because of the sedation medicines used during the test).    FOLLOW UP: Our staff will call the number listed on your records the next business day following your procedure.  We will call around 7:15- 8:00 am to check on you and address any questions or concerns that you may have regarding the information given to you following your procedure. If we do not reach you, we will leave a message.     If any biopsies were taken you will be contacted by phone or by letter within the next 1-3 weeks.  Please call us  at [phone] if you have not heard about the biopsies in 3 weeks.    SIGNATURES/CONFIDENTIALITY: You and/or your care partner have signed paperwork which will be entered into your electronic medical record.  These signatures attest to the fact that that the information above on your After Visit Summary has been reviewed and is understood.  Full responsibility of the confidentiality of this discharge information  lies with you and/or your care-partner.

## 2024-06-04 NOTE — Progress Notes (Signed)
 Report given to PACU, vss

## 2024-06-04 NOTE — Progress Notes (Signed)
 Chief Complaint: GI eval  Referring Provider:  Katheen Roselie Rockford, NP      ASSESSMENT AND PLAN;   #1. GERD with small HH with Barrett's on GE Jn Bx.  #2. H/O colonic polyps.  #3. IBS-C. Neg colon 06/2019, rpt colon due 06/2024 d/t H/O adenomatous polyps.  #4. Longstanding microcytic anemia with heme neg stools. ?Hemoglobinopathy. Neg colon 06/2019   Plan: -EGD/colon with 2 day prep. Hold Monjuaro 1 week before -Continue protonix  40mg  po QD -Metamucil to continue  -Continue Miralax prn -Encouraged her to lose weight.     I discussed EGD/Colonoscopy- the indications, risks, alternatives and potential complications including, but not limited to bleeding, infection, reaction to meds, damage to internal organs, cardiac and/or pulmonary problems, and perforation requiring surgery. The possibility that significant findings could be missed was explained. All ? were answered. Pt consents to proceed.  HPI:    Mackenzie Weber is a 63 y.o. female  GERD, DM2, obesity, SB adhesions, S/P cholecystectomy, TAH with BSO  Discussed the use of AI scribe software for clinical note transcription with the patient, who gave verbal consent to proceed.  History of Present Illness Mackenzie Weber is a 63 year old female with Barrett's esophagus who presents for follow-up and evaluation of her gastrointestinal conditions.  She is here for follow-up regarding her Barrett's esophagus and history of polyps. A colonoscopy in September 2020 revealed polyps, with a recommendation to repeat it in five years. An endoscopy in 2022 confirmed Barrett's esophagus by biopsy. Her reflux symptoms have improved with dietary modifications, such as avoiding eating before bed.  She is currently taking Protonix  once a day, which is effective. Initially, she experienced voice issues, later attributed to acid reflux. She has not undergone a sleep test and is unsure if she has sleep apnea, although her BMI is  42.  For constipation, she occasionally uses magnesium  Celtic water and Miralax, especially when she was on Ozempic , which slowed her bowel movements. She is now taking Mounjaro  once a week and has noticed it is better than Ozempic . She has lost about six to seven pounds since starting Mounjaro .  Her hemoglobin has been low. She experiences fatigue but believes it is due to her busy schedule rather than sleep apnea. She plans to improve her exercise routine, which was disrupted when her husband had surgery. Her knee has been sore, affecting her ability to walk and ride a bicycle, but she intends to gradually resume these activities to alleviate stiffness.     From previous notes: With multiple GI problems  -Continues to have heartburn with postprandial abdominal bloating.  She stopped taking Nexium  on her own.  This did result in worsening of symptoms.  Also has shortness of breath after eating.  She has tried Protonix  in past.  Most recent EGD as below showed small hiatal hernia, short segment Barrett's esophagus.  She denies having any odynophagia or dysphagia.  Does have postnasal drip and sinus problems.  Had seen ENT at [facility] few years ago.  No nausea or vomiting. H/O heartburn x 20 years.  -Longstanding history of constipation with pellet-like stools, negative colonoscopy 06/2019 as below.  It was recommended to repeat colonoscopy in 5 years due to previous history of tubular adenomas.  Now with more lower abdominal pain especially LLQ.  She denies having any fever but has occasional chills.  The abdominal pain is getting worse.  She does take Metamucil every day.  -Has gained more weight.  Not sure if she has sleep apnea but she does snore.  Family history of colon cancer in a distant second-degree relative.  She always had low hemoglobin with significant microcytosis.  In 2013 her hemoglobin was 10.7 with MCV 66.  Most recently her hemoglobin was 11 with MCV 67.  She had  heme-negative stools.  Wt Readings from Last 3 Encounters:  06/04/24 224 lb 9.6 oz (101.9 kg)  04/07/24 220 lb 4.8 oz (99.9 kg)  02/25/24 217 lb (98.4 kg)       Past GI work-up:  Colonoscopy 06/24/2019 (Dr Timm) -Good prep -Small internal hemorrhoids -Otherwise normal colonoscopy. -Repeat in 5 years due to previous H/O adenomatous polyps.  EGD 04/12/2021 - Z-line minimally irregular, 35 cm from the incisors. Biopsied. Rpt 3 yrs - Small transient hiatal hernia. 1. Surgical [P], duodenal biopsies - DUODENAL MUCOSA WITH NORMAL VILLOUS ARCHITECTURE. - NO VILLOUS ATROPHY OR INCREASED INTRAEPITHELIAL LYMPHOCYTES. 2. Surgical [P], gastric biopsies - UNREMARKABLE ANTRAL AND OXYNTIC MUCOSA. GLENWOOD PHLEGM NEGATIVE FOR HELICOBACTER PYLORI. - NO INTESTINAL METAPLASIA, DYSPLASIA OR CARCINOMA. 3. Surgical [P], distal esophagus - GASTROESOPHAGEAL MUCOSA WITH INTESTINAL METAPLASIA CONSISTENT WITH BARRETT'S ESOPHAGUS. - NO DYSPLASIA OR CARCINOMA.  EGD 05/12/2018 -Small HH -GE junction biopsies with intestinal metaplasia consistent with Barrett's. 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  No dysplasia. -Repeat EGD in 3 years.  CT AP with contrast 01/2011 1.  No acute abnormality on CT of the abdomen and pelvis.  2.  No renal or ureteral calculi.  3.  The terminal ileum is well seen and appears normal.  The  patient has previously undergone appendectomy and hysterectomy.  No  adnexal lesion is seen.  4.  Probable left parapelvic cyst along the inferior pelvis but the  attenuation is slightly higher than would be expected.  Consider  renal ultrasound to assess further.  Op note 10/1996: L salpingo-oophorectomy with partial SB resection d/t extensive adhesions, and appendicectomy.     Past Medical History:  Diagnosis Date   Anemia    Asthma    Diabetes mellitus without complication (HCC)    Foot pain, right    Hypertension    IBS  (irritable bowel syndrome)    Morbid obesity (HCC) 04/07/2024   bmi 44.41   Sciatica of left side    Seasonal allergies     Past Surgical History:  Procedure Laterality Date   ABDOMINAL HYSTERECTOMY     APPENDECTOMY     BLADDER SUSPENSION  2012   TVT   BREAST CYST EXCISION     BREAST SURGERY  2001   /biopsy-benign   CHOLECYSTECTOMY     COLONOSCOPY  06/24/2019   [facility]   ESOPHAGOGASTRODUODENOSCOPY  05/12/2018   [facility]    EXCISIONAL HEMORRHOIDECTOMY     KNEE SURGERY     right   SHOULDER SURGERY Left 02/13/2018   Lipoma removed    Small Bowel Surgery     TONSILLECTOMY     TOTAL ABDOMINAL HYSTERECTOMY W/ BILATERAL SALPINGOOPHORECTOMY  1989   LSO-1987; RsO-1998   WRIST SURGERY     carpal tunnel repair    Family History  Problem Relation Age of Onset   Diabetes Mother    Hypertension Mother    Asthma Mother    Heart disease Mother    Allergic rhinitis Mother    Diabetes Father    Hypertension Father    Heart disease Father    Hypertension Sister    Diabetes Sister  Breast cancer Sister 22   Colon cancer Maternal Uncle    Hypertension Paternal Uncle    Diabetes Brother    Pancreatic cancer Neg Hx    Stomach cancer Neg Hx    Esophageal cancer Neg Hx    Liver disease Neg Hx     Social History   Tobacco Use   Smoking status: Never    Passive exposure: Never   Smokeless tobacco: Never  Vaping Use   Vaping status: Never Used  Substance Use Topics   Alcohol use: No   Drug use: No    Current Outpatient Medications  Medication Sig Dispense Refill   albuterol  (VENTOLIN  HFA) 108 (90 Base) MCG/ACT inhaler Inhale 2 puffs into the lungs every 4 (four) hours as needed for wheezing or shortness of breath. 18 g 1   Budeson-Glycopyrrol-Formoterol  (BREZTRI  AEROSPHERE) 160-9-4.8 MCG/ACT AERO Inhale 2 puffs into the lungs daily. Use with spacer. Rinse, gargle and spit out after use. 10.7 g 5   estradiol  (ESTRACE ) 0.1 MG/GM vaginal cream Place 1  Applicatorful vaginally once a week.     famotidine  (PEPCID ) 20 MG tablet TAKE 1 TABLET BY MOUTH AT BEDTIME 30 tablet 5   losartan  (COZAAR ) 100 MG tablet Take 1 tablet (100 mg total) by mouth daily. 90 tablet 0   metFORMIN  (GLUCOPHAGE -XR) 500 MG 24 hr tablet Take 1 tablet by mouth once daily with breakfast 90 tablet 3   Multiple Vitamin (MULTIVITAMIN) capsule Take 1 capsule by mouth daily.     pantoprazole  (PROTONIX ) 20 MG tablet Take 2 tablets (40 mg total) by mouth daily. 90 tablet 1   rosuvastatin  (CRESTOR ) 10 MG tablet Take 1 tablet (10 mg total) by mouth daily. 90 tablet 3   triamterene -hydrochlorothiazide (MAXZIDE) 75-50 MG tablet Take 0.5 tablets by mouth every other day. 45 tablet 1   Albuterol -Budesonide  (AIRSUPRA ) 90-80 MCG/ACT AERO Inhale 2 puffs into the lungs as needed (maximum 12 puffs/day). (Patient not taking: No sig reported) 10.7 g 3   azelastine  (ASTELIN ) 0.1 % nasal spray 1-2 sprays  each nostril twice daily as needed 90 mL 1   blood glucose meter kit and supplies KIT Check blood sugar once daily. One Touch Verio Reflect Meter 1 each 0   clobetasol ointment (TEMOVATE) 0.05 % Apply 1 Application topically 2 (two) times daily. For 7 days (Patient not taking: No sig reported)     EPINEPHrine  (AUVI-Q ) 0.3 mg/0.3 mL IJ SOAJ injection Inject 0.3 mg into the muscle as needed for anaphylaxis. (Patient not taking: Reported on 06/04/2024) 2 each 1   fluticasone  (FLONASE ) 50 MCG/ACT nasal spray 2 sprays in each nostril once a day as needed for a stuffy nose (Patient not taking: No sig reported) 16 g 5   glucose blood (ONETOUCH VERIO) test strip Use as instructed 100 each 0   guaiFENesin -codeine  100-10 MG/5ML syrup Take 10 mLs by mouth 3 (three) times daily as needed for cough. (Patient not taking: Reported on 06/04/2024) 120 mL 0   ipratropium (ATROVENT ) 0.06 % nasal spray Place 2 sprays into both nostrils 4 (four) times daily as needed for rhinitis. 15 mL 5   Lancets (ONETOUCH DELICA PLUS  LANCET33G) MISC USE 1  TO CHECK GLUCOSE ONCE DAILY FOR BLOOD SUGAR 100 each 0   lubiprostone  (AMITIZA ) 8 MCG capsule Take 1 capsule (8 mcg total) by mouth 2 (two) times daily with a meal. (Patient not taking: No sig reported) 60 capsule 5   tirzepatide  (MOUNJARO ) 5 MG/0.5ML Pen Inject 5 mg into  the skin once a week. 6 mL 1   Current Facility-Administered Medications  Medication Dose Route Frequency Provider Last Rate Last Admin   0.9 %  sodium chloride  infusion  500 mL Intravenous Continuous Charlanne Groom, MD        Allergies  Allergen Reactions   Aspirin Anxiety and Other (See Comments)    Rapid Heart beat    Review of Systems:  neg     Physical Exam:    BP (!) 142/96   Pulse 65   Temp 97.7 F (36.5 C)   Ht 4' 11.5 (1.511 m)   Wt 224 lb 9.6 oz (101.9 kg)   SpO2 98%   BMI 44.60 kg/m  Wt Readings from Last 3 Encounters:  06/04/24 224 lb 9.6 oz (101.9 kg)  04/07/24 220 lb 4.8 oz (99.9 kg)  02/25/24 217 lb (98.4 kg)   Constitutional:  Well-developed, in no acute distress. Psychiatric: Normal mood and affect. Behavior is normal. HEENT: Pupils normal.  Conjunctivae are normal. No scleral icterus. Neck supple.  Cardiovascular: Normal rate, regular rhythm. No edema Pulmonary/chest: Effort normal and breath sounds normal. No wheezing, rales or rhonchi. Abdominal: Soft, nondistended. LLQ tenderness without rebound. Bowel sounds active throughout. There are no masses palpable. No hepatomegaly. Rectal: Deferred Neurological: Alert and oriented to person place and time. Skin: Skin is warm and dry. No rashes noted.  Data Reviewed: I have personally reviewed following labs and imaging studies  CBC:    Latest Ref Rng & Units 03/20/2024    8:58 AM 11/16/2021   11:41 AM 11/08/2021    8:24 AM  CBC  WBC 4.0 - 10.5 K/uL 5.1  6.1  4.6   Hemoglobin 12.0 - 15.0 g/dL 88.5  88.7  89.9   Hematocrit 36.0 - 46.0 % 36.6  36.7  33.4   Platelets 150.0 - 400.0 K/uL 272.0  328.0  327      CMP:    Latest Ref Rng & Units 03/20/2024    8:58 AM 08/22/2023    2:57 PM 02/13/2023    9:41 AM  CMP  Glucose 70 - 99 mg/dL 92  86  85   BUN 6 - 23 mg/dL 17  17  16    Creatinine 0.40 - 1.20 mg/dL 9.03  8.99  9.03   Sodium 135 - 145 mEq/L 140  139  141   Potassium 3.5 - 5.1 mEq/L 3.8  3.8  4.0   Chloride 96 - 112 mEq/L 105  104  104   CO2 19 - 32 mEq/L 27  27  29    Calcium  8.4 - 10.5 mg/dL 9.4  9.9  9.7   Total Protein 6.0 - 8.3 g/dL 7.0   6.8   Total Bilirubin 0.2 - 1.2 mg/dL 0.3   0.3   Alkaline Phos 39 - 117 U/L 60   54   AST 0 - 37 U/L 16   12   ALT 0 - 35 U/L 15   11         Anselm Charlanne, MD 06/04/2024, 2:50 PM  Cc: Katheen Roselie Rockford, NP

## 2024-06-04 NOTE — Op Note (Signed)
  [facility] Patient Name: Mackenzie Weber Procedure Date: 06/04/2024 2:57 PM MRN: [id number] Endoscopist: Lynnie Bring , MD, [id number] Age: 63 Referring MD:  Date of Birth: 04-20-1961 Gender: Female Account #: [id number] Procedure:                Upper GI endoscopy Indications:              GERD Medicines:                Monitored Anesthesia Care Procedure:                Pre-Anesthesia Assessment:                           - Prior to the procedure, a History and Physical                            was performed, and patient medications and                            allergies were reviewed. The patient's tolerance of                            previous anesthesia was also reviewed. The risks                            and benefits of the procedure and the sedation                            options and risks were discussed with the patient.                            All questions were answered, and informed consent                            was obtained. Prior Anticoagulants: The patient has                            taken no anticoagulant or antiplatelet agents. ASA                            Grade Assessment: III - A patient with severe                            systemic disease. After reviewing the risks and                            benefits, the patient was deemed in satisfactory                            condition to undergo the procedure.                           After obtaining informed consent, the endoscope was  passed under direct vision. Throughout the                            procedure, the patient's blood pressure, pulse, and                            oxygen saturations were monitored continuously. The                            Olympus Scope [id number] was introduced through the                            mouth, and advanced to the second part of duodenum.                            The upper GI endoscopy was accomplished  without                            difficulty. The patient tolerated the procedure                            well. Scope In: Scope Out: Findings:                 The examined esophagus was normal.                           The Z-line was irregular and was found 35 cm from                            the incisors. Biopsies were taken with a cold                            forceps for histology.                           A small hiatal hernia was present. Best visualized                            on full distention of the lower esophagus.                           The exam of the stomach was otherwise normal.                           The examined duodenum was normal. Complications:            No immediate complications. Estimated Blood Loss:     Estimated blood loss: none. Impression:               - Z-line irregular, 35 cm from the incisors.                            Biopsied.                           -  Small hiatal hernia.                           - No endoscopic evidence of Barrett's esophagus. Recommendation:           - Patient has a contact number available for                            emergencies. The signs and symptoms of potential                            delayed complications were discussed with the                            patient. Return to normal activities tomorrow.                            Written discharge instructions were provided to the                            patient.                           - Resume previous diet.                           - Continue present medications.                           - Await pathology results.                           - The findings and recommendations were discussed                            with the patient's family. Lynnie Bring, MD 06/04/2024 3:23:21 PM This report has been signed electronically.

## 2024-06-04 NOTE — Op Note (Signed)
 [facility] Patient Name: Mackenzie Weber Procedure Date: 06/04/2024 2:49 PM MRN: [id number] Endoscopist: Lynnie Bring , MD, [id number] Age: 63 Referring MD:  Date of Birth: 03-22-1961 Gender: Female Account #: [id number] Procedure:                Colonoscopy Indications:              High risk colon cancer surveillance: Personal                            history of colonic polyps Medicines:                Monitored Anesthesia Care Procedure:                Pre-Anesthesia Assessment:                           - Prior to the procedure, a History and Physical                            was performed, and patient medications and                            allergies were reviewed. The patient's tolerance of                            previous anesthesia was also reviewed. The risks                            and benefits of the procedure and the sedation                            options and risks were discussed with the patient.                            All questions were answered, and informed consent                            was obtained. Prior Anticoagulants: The patient has                            taken no anticoagulant or antiplatelet agents. ASA                            Grade Assessment: III - A patient with severe                            systemic disease. After reviewing the risks and                            benefits, the patient was deemed in satisfactory                            condition to undergo the procedure.  After obtaining informed consent, the colonoscope                            was passed under direct vision. Throughout the                            procedure, the patient's blood pressure, pulse, and                            oxygen saturations were monitored continuously. The                            CF [id number] #[id number] was introduced through the anus                            and advanced to the the cecum,  identified by                            appendiceal orifice and ileocecal valve. The                            colonoscopy was performed without difficulty. The                            patient tolerated the procedure well. The quality                            of the bowel preparation was good. The ileocecal                            valve, appendiceal orifice, and rectum were                            photographed. Scope In: 3:08:05 PM Scope Out: 3:18:05 PM Scope Withdrawal Time: 0 hours 6 minutes 52 seconds  Total Procedure Duration: 0 hours 10 minutes 0 seconds  Findings:                 The colon (entire examined portion) appeared                            normal. Rare small diverticula in sigmoid colon.                           Non-bleeding internal hemorrhoids were found during                            retroflexion. The hemorrhoids were small and Grade                            I (internal hemorrhoids that do not prolapse).                           Retroflexion in the right colon was performed.  The exam was otherwise without abnormality on                            direct and retroflexion views. Complications:            No immediate complications. Estimated Blood Loss:     Estimated blood loss: none. Impression:               - The entire examined colon is normal.                           - Non-bleeding internal hemorrhoids.                           - The examination was otherwise normal on direct                            and retroflexion views.                           - No specimens collected. Recommendation:           - Patient has a contact number available for                            emergencies. The signs and symptoms of potential                            delayed complications were discussed with the                            patient. Return to normal activities tomorrow.                            Written discharge  instructions were provided to the                            patient.                           - Resume previous diet.                           - Continue present medications.                           - Repeat colonoscopy in 10 years for screening                            purposes. Earlier, if any new problems or change in                            family history.                           - The findings and recommendations were discussed  with the patient's family. Lynnie Bring, MD 06/04/2024 3:22:13 PM This report has been signed electronically.

## 2024-06-04 NOTE — Progress Notes (Signed)
 Called to room to assist during endoscopic procedure.  Patient ID and intended procedure confirmed with present staff. Received instructions for my participation in the procedure from the performing physician.

## 2024-06-04 NOTE — Progress Notes (Signed)
1452 Robinul 0.1 mg IV given due large amount of secretions upon assessment.  MD made aware, vss

## 2024-06-05 ENCOUNTER — Telehealth: Payer: Self-pay

## 2024-06-05 NOTE — Telephone Encounter (Signed)
  Follow up Call-     06/04/2024    2:21 PM  Call back number  Post procedure Call Back phone  # [phone]  Permission to leave phone message Yes     Patient questions:  Do you have a fever, pain , or abdominal swelling? No. Pain Score  0 *  Have you tolerated food without any problems? Yes.    Have you been able to return to your normal activities? Yes.    Do you have any questions about your discharge instructions: Diet   No. Medications  No. Follow up visit  No.  Do you have questions or concerns about your Care? No.  Actions: * If pain score is 4 or above: No action needed, pain <4.

## 2024-06-09 LAB — SURGICAL PATHOLOGY

## 2024-06-28 ENCOUNTER — Ambulatory Visit: Payer: Self-pay | Admitting: Gastroenterology

## 2024-07-02 ENCOUNTER — Other Ambulatory Visit: Payer: Self-pay | Admitting: Nurse Practitioner

## 2024-07-02 DIAGNOSIS — R600 Localized edema: Secondary | ICD-10-CM

## 2024-07-02 DIAGNOSIS — I10 Essential (primary) hypertension: Secondary | ICD-10-CM

## 2024-07-09 ENCOUNTER — Ambulatory Visit: Admitting: Allergy & Immunology

## 2024-07-10 ENCOUNTER — Ambulatory Visit: Payer: Self-pay

## 2024-07-10 NOTE — Telephone Encounter (Signed)
 FYI Only or Action Required?: FYI only for provider. Leg swelling that has been going on for over a month. Scheduled for acute appointment on 07/17/2024 at 10:20 AM.  Patient was last seen in primary care on 02/18/2024 by Nche, Mackenzie Rockford, NP.  Called Nurse Triage reporting Leg Swelling.  Symptoms began about a month ago.  Interventions attempted: Rest, hydration, or home remedies.  Symptoms are: unchanged.  Triage Disposition: See PCP Within 2 Weeks  Patient/caregiver understands and will follow disposition?: Yes  Copied from CRM [id number]. Topic: Clinical - Red Word Triage >> Jul 10, 2024 11:12 AM Mackenzie Weber wrote: Patient having swelling in both feet and legs Reason for Disposition  [1] MILD swelling of both ankles (i.e., pedal edema) AND [2] is a chronic symptom (recurrent or ongoing AND present > 4 weeks)  Answer Assessment - Initial Assessment Questions 1. ONSET: When did the swelling start? (e.g., minutes, hours, days)     Started a month ago 2. LOCATION: What part of the leg is swollen?  Are both legs swollen or just one leg?     Both legs goes up into calf arm 3. SEVERITY: How bad is the swelling? (e.g., localized; mild, moderate, severe)     mild 4. REDNESS: Is there redness or signs of infection?     no 5. PAIN: Is the swelling painful to touch? If Yes, ask: How painful is it?   (Scale 1-10; mild, moderate or severe)     no 6. FEVER: Do you have a fever? If Yes, ask: What is it, how was it measured, and when did it start?      no 7. CAUSE: What do you think is causing the leg swelling?     unsure 8. MEDICAL HISTORY: Do you have a history of blood clots (e.g., DVT), cancer, heart failure, kidney disease, or liver failure?     no 9. RECURRENT SYMPTOM: Have you had leg swelling before? If Yes, ask: When was the last time? What happened that time?     Yes-last episode of swelling was possibly earlier this year 10. OTHER SYMPTOMS: Do you have  any other symptoms? (e.g., chest pain, difficulty breathing)       no  Protocols used: Leg Swelling and Edema-A-AH

## 2024-07-17 ENCOUNTER — Ambulatory Visit: Admitting: Nurse Practitioner

## 2024-07-17 ENCOUNTER — Encounter: Payer: Self-pay | Admitting: Nurse Practitioner

## 2024-07-17 VITALS — BP 136/80 | HR 78 | Temp 97.9°F | Ht 59.5 in | Wt 228.8 lb

## 2024-07-17 DIAGNOSIS — Z23 Encounter for immunization: Secondary | ICD-10-CM | POA: Diagnosis not present

## 2024-07-17 DIAGNOSIS — R600 Localized edema: Secondary | ICD-10-CM | POA: Diagnosis not present

## 2024-07-17 MED ORDER — FUROSEMIDE 20 MG PO TABS: 20.0000 mg | ORAL_TABLET | Freq: Every day | ORAL | 0 refills | Status: DC

## 2024-07-17 NOTE — Assessment & Plan Note (Signed)
 Acute on chronic LE edema involving the ankles. Waxing and waxing, worse with prolonged walking or sitting and high salt diet. Admits to taking maxzide prn due to to work and school schedule No SOB or PND or CP.  Sent furosemide  x 3days Hold maxzide while taking furosemide  Maintain low salt/sodium/high potassium diet Use compression stocking during the day and off at night

## 2024-07-17 NOTE — Progress Notes (Signed)
 Established Patient Visit  Patient: Mackenzie Weber   DOB: [birth year]   63 y.o. Female  MRN: [id number] Visit Date: 07/17/2024  Subjective:    Chief Complaint  Patient presents with   Leg Swelling    Bilateral leg swelling intermittently for 1 month worse last week Left knee (dull) pain  Wants/Need flu vaccine  Due for Shingles and Pneumococcal vaccines  Requesting Eye exam    HPI Bilateral lower extremity edema Acute on chronic LE edema involving the ankles. Waxing and waxing, worse with prolonged walking or sitting and high salt diet. Admits to taking maxzide prn due to to work and school schedule No SOB or PND or CP.  Sent furosemide  x 3days Hold maxzide while taking furosemide  Maintain low salt/sodium/high potassium diet Use compression stocking during the day and off at night  Wt Readings from Last 3 Encounters:  07/17/24 228 lb 12.8 oz (103.8 kg)  06/04/24 224 lb 9.6 oz (101.9 kg)  04/07/24 220 lb 4.8 oz (99.9 kg)    Reviewed medical, surgical, and social history today  Medications: Outpatient Medications Prior to Visit  Medication Sig   albuterol  (VENTOLIN  HFA) 108 (90 Base) MCG/ACT inhaler Inhale 2 puffs into the lungs every 4 (four) hours as needed for wheezing or shortness of breath.   Albuterol -Budesonide  (AIRSUPRA ) 90-80 MCG/ACT AERO Inhale 2 puffs into the lungs as needed (maximum 12 puffs/day).   azelastine  (ASTELIN ) 0.1 % nasal spray 1-2 sprays  each nostril twice daily as needed   blood glucose meter kit and supplies KIT Check blood sugar once daily. One Touch Verio Reflect Meter   Budeson-Glycopyrrol-Formoterol  (BREZTRI  AEROSPHERE) 160-9-4.8 MCG/ACT AERO Inhale 2 puffs into the lungs daily. Use with spacer. Rinse, gargle and spit out after use.   clobetasol ointment (TEMOVATE) 0.05 % Apply 1 Application topically 2 (two) times daily. For 7 days   EPINEPHrine  (AUVI-Q ) 0.3 mg/0.3 mL IJ SOAJ injection Inject 0.3 mg into the muscle as needed for  anaphylaxis.   estradiol  (ESTRACE ) 0.1 MG/GM vaginal cream Place 1 Applicatorful vaginally once a week.   famotidine  (PEPCID ) 20 MG tablet TAKE 1 TABLET BY MOUTH AT BEDTIME   fluticasone  (FLONASE ) 50 MCG/ACT nasal spray 2 sprays in each nostril once a day as needed for a stuffy nose   glucose blood (ONETOUCH VERIO) test strip Use as instructed   guaiFENesin -codeine  100-10 MG/5ML syrup Take 10 mLs by mouth 3 (three) times daily as needed for cough.   ipratropium (ATROVENT ) 0.06 % nasal spray Place 2 sprays into both nostrils 4 (four) times daily as needed for rhinitis.   Lancets (ONETOUCH DELICA PLUS LANCET33G) MISC USE 1  TO CHECK GLUCOSE ONCE DAILY FOR BLOOD SUGAR   losartan  (COZAAR ) 100 MG tablet Take 1 tablet (100 mg total) by mouth daily.   lubiprostone  (AMITIZA ) 8 MCG capsule Take 1 capsule (8 mcg total) by mouth 2 (two) times daily with a meal.   metFORMIN  (GLUCOPHAGE -XR) 500 MG 24 hr tablet Take 1 tablet by mouth once daily with breakfast   Multiple Vitamin (MULTIVITAMIN) capsule Take 1 capsule by mouth daily.   pantoprazole  (PROTONIX ) 20 MG tablet Take 2 tablets (40 mg total) by mouth daily.   rosuvastatin  (CRESTOR ) 10 MG tablet Take 1 tablet (10 mg total) by mouth daily.   tirzepatide  (MOUNJARO ) 5 MG/0.5ML Pen Inject 5 mg into the skin once a week.   triamterene -hydrochlorothiazide (MAXZIDE) 75-50 MG tablet Take 1/2 (  one-half) tablet by mouth once daily   No facility-administered medications prior to visit.   Reviewed past medical and social history.   ROS per HPI above      Objective:  BP 136/80 (BP Location: Left Arm, Patient Position: Sitting, Cuff Size: Large)   Pulse 78   Temp 97.9 F (36.6 C) (Oral)   Ht 4' 11.5 (1.511 m)   Wt 228 lb 12.8 oz (103.8 kg)   BMI 45.44 kg/m      Physical Exam Vitals reviewed.  Cardiovascular:     Rate and Rhythm: Normal rate and regular rhythm.  Pulmonary:     Effort: Pulmonary effort is normal.     Breath sounds: Normal breath  sounds.  Musculoskeletal:     Right lower leg: Edema present.     Left lower leg: Edema present.  Skin:    Findings: No erythema or rash.  Neurological:     Mental Status: She is alert and oriented to person, place, and time.     No results found for any visits on 07/17/24.    Assessment & Plan:    Problem List Items Addressed This Visit     Bilateral lower extremity edema - Primary   Acute on chronic LE edema involving the ankles. Waxing and waxing, worse with prolonged walking or sitting and high salt diet. Admits to taking maxzide prn due to to work and school schedule No SOB or PND or CP.  Sent furosemide  x 3days Hold maxzide while taking furosemide  Maintain low salt/sodium/high potassium diet Use compression stocking during the day and off at night      Relevant Medications   furosemide  (LASIX ) 20 MG tablet   Other Visit Diagnoses       Need for influenza vaccination       Relevant Orders   Flu vaccine HIGH DOSE PF(Fluzone Trivalent) (Completed)      Return for maintain upcoming appt.     Roselie Mood, NP

## 2024-07-17 NOTE — Patient Instructions (Addendum)
 Maintain low salt/sodium/high potassium diet Use compression stocking during the day and off at night Hold maxzide while taking furosemide 

## 2024-07-28 ENCOUNTER — Other Ambulatory Visit: Payer: Self-pay

## 2024-07-28 ENCOUNTER — Ambulatory Visit: Admitting: Allergy & Immunology

## 2024-07-28 ENCOUNTER — Encounter: Payer: Self-pay | Admitting: Allergy & Immunology

## 2024-07-28 VITALS — BP 128/90 | HR 67 | Temp 98.3°F | Resp 16 | Ht 59.0 in | Wt 224.6 lb

## 2024-07-28 DIAGNOSIS — J4540 Moderate persistent asthma, uncomplicated: Secondary | ICD-10-CM

## 2024-07-28 DIAGNOSIS — J984 Other disorders of lung: Secondary | ICD-10-CM

## 2024-07-28 DIAGNOSIS — J3089 Other allergic rhinitis: Secondary | ICD-10-CM | POA: Diagnosis not present

## 2024-07-28 DIAGNOSIS — K219 Gastro-esophageal reflux disease without esophagitis: Secondary | ICD-10-CM

## 2024-07-28 DIAGNOSIS — J302 Other seasonal allergic rhinitis: Secondary | ICD-10-CM

## 2024-07-28 NOTE — Patient Instructions (Addendum)
 1. Moderate persistent asthma without complication - Spirometry today looked worse than last time.  - I am going to order a full pulmonary  function testing to look into this.  - I am not sure why your spirometry looks worse today.  - This might help answer the questions we have.  - Daily controller medication(s): Breztri  two puffs TWICE daily - Prior to physical activity: AiurSupra two puffs 10-15 minutes before physical activity. - Rescue medications: AirSupra  2-4 puffs every 4-6 hours as needed - Asthma control goals:  * Full participation in all desired activities (may need albuterol  before activity) * Albuterol  use two time or less a week on average (not counting use with activity) * Cough interfering with sleep two time or less a month * Oral steroids no more than once a year * No hospitalizations  2. Perennial and seasonal allergic rhinitis - Continue with the nasal saline lavage as needed.  - Continue with Allegra 180mg  daily.  - Continue with the Xyzal  5mg  daily.   3. Gastroesophageal reflux disease - Continue with Protonix  and Pepcid  at least once daily.  - You can increase both to twice daily to see if this helps at all.   4. Return in about 2 months (around 09/27/2024). You can have the follow up appointment with Dr. Iva or a Nurse Practicioner (our Nurse Practitioners are excellent and always have Physician oversight!).    Please inform us  of any Emergency Department visits, hospitalizations, or changes in symptoms. Call us  before going to the ED for breathing or allergy symptoms since we might be able to fit you in for a sick visit. Feel free to contact us  anytime with any questions, problems, or concerns.  It was a pleasure to see you again today!  Websites that have reliable patient information: 1. American Academy of Asthma, Allergy, and Immunology: www.aaaai.org 2. Food Allergy Research and Education (FARE): foodallergy.org 3. Mothers of Asthmatics:  http://www.asthmacommunitynetwork.org 4. American College of Allergy, Asthma, and Immunology: www.acaai.org      "Like" us  on Facebook and Instagram for our latest updates!      A healthy democracy works best when [employer] participate! Make sure you are registered to vote! If you have moved or changed any of your contact information, you will need to get this updated before voting! Scan the QR codes below to learn more!

## 2024-07-28 NOTE — Progress Notes (Signed)
 FOLLOW UP  Date of Service/Encounter:  07/28/24   Assessment:   Moderate persistent asthma, uncomplicated - with improved control since using her medications as directed  Restrictive lung disease - ordering full pulmonary functioning testing   Perennial and seasonal allergic rhinitis - previously on allergen immunotherapy but now stopping   GERD - on Nexium  and Pepcid  (followed by Dr. Charlanne)    [name]    Plan/Recommendations:   1. Moderate persistent asthma without complication - Spirometry today looked worse than last time.  - I am going to order a full pulmonary  function testing to look into this.  - I am not sure why your spirometry looks worse today.  - This might help answer the questions we have.  - Daily controller medication(s): Breztri  two puffs TWICE daily - Prior to physical activity: AiurSupra two puffs 10-15 minutes before physical activity. - Rescue medications: AirSupra  2-4 puffs every 4-6 hours as needed - Asthma control goals:  * Full participation in all desired activities (may need albuterol  before activity) * Albuterol  use two time or less a week on average (not counting use with activity) * Cough interfering with sleep two time or less a month * Oral steroids no more than once a year * No hospitalizations  2. Perennial and seasonal allergic rhinitis - Continue with the nasal saline lavage as needed.  - Continue with Allegra 180mg  daily.  - Continue with the Xyzal  5mg  daily.   3. Gastroesophageal reflux disease - Continue with Protonix  and Pepcid  at least once daily.  - You can increase both to twice daily to see if this helps at all.   4. Return in about 2 months (around 09/27/2024). You can have the follow up appointment with Dr. Iva or a Nurse Practicioner (our Nurse Practitioners are excellent and always have Physician oversight!).   Subjective:   Mackenzie Weber is a 63 y.o. female presenting today for follow up  of  Chief Complaint  Patient presents with   Follow-up   Asthma   Allergic Rhinitis     Mackenzie Weber has a history of the following: Patient Active Problem List   Diagnosis Date Noted   Hyperlipidemia associated with type 2 diabetes mellitus (HCC) 11/25/2023   Moderate persistent asthma with acute exacerbation 10/14/2023   Chronic idiopathic constipation 08/22/2023   GAD (generalized anxiety disorder) 12/15/2021   Allergic conjunctivitis of both eyes 11/16/2020   Allergic conjunctivitis 05/30/2020   Not well controlled moderate persistent asthma 05/30/2020   Grade I diastolic dysfunction 03/18/2020   Bilateral foot pain 01/13/2020   Extensor tendonitis of foot 01/13/2020   DM (diabetes mellitus) (HCC) 01/01/2019   Seasonal and perennial allergic rhinitis 05/20/2018   Chronic bilateral low back pain with left-sided sciatica 05/20/2018   Lipoma of left upper extremity 01/29/2018   Muscle spasm of left lower extremity 01/29/2018   Intestinal ulcer 04/23/2017   Bilateral lower extremity edema 04/23/2017   GERD (gastroesophageal reflux disease) 03/28/2016   History of colon polyps 03/28/2016   Microcytic anemia 03/28/2016   Barrett's esophagus 03/28/2016   Somatic dysfunction of cervical region 11/16/2015   Chronic sciatica 01/28/2015   Somatic dysfunction of pelvic region 01/28/2015   Chronic pelvic pain in female 01/28/2015   Somatic dysfunction of lower extremity 01/28/2015   Somatic dysfunction of sacral region 01/28/2015   Hypertensive disorder 10/22/2014   Obesity, morbid (HCC) 10/22/2014   Abdominal muscle strain 06/07/2014   Sciatica of left side 11/03/2012  History obtained from: chart review and patient.  Discussed the use of AI scribe software for clinical note transcription with the patient and/or guardian, who gave verbal consent to proceed.  Mackenzie Weber is a 63 y.o. female presenting for a follow up visit.  She was last seen in June 2025.  At that time, we  talked about starting Tezspire to help supplement her breast tree.  For her rhinitis, we continue with Allegra and Xyzal .  We also continued with Protonix  and Pepcid .  Since last visit, she has been doing better.   Asthma/Respiratory Symptom History: She has been walking more and she has been using the Breztri  as prescribed.  She feels that this has been controlling her symptoms adequately. She was previously told she had COPD, but another doctor later told her she did not have COPD. She is currently on Breztri , taking two puffs twice a day. She remains active, walking frequently at the hospital and on the streets.  In August, she experienced shortness of breath, which she attributed to altitude changes while visiting [place]. Her primary care provider prescribed diuretics for her swollen ankles, which helped significantly. She has seen cardiology in the past, but has been a while.  Recently, she experienced nausea and headache after exposure to oven cleaning fumes, which she identified as the cause of her symptoms. This occurred last night, and she noted a decrease in her lung function. She feels good overall, with some nasal congestion but no chest pain or chronic coughing.  Allergic Rhinitis Symptom History: She is on the Allegra and Xyzal .  She also has nasal saline that she uses as needed.  This medication regimen seems to be working well.  GERD Symptom History: She takes Pepcid  at night and pantoprazole  in the morning, which generally control her symptoms, although she occasionally feels too full after eating. Her acid reflux symptoms can be exacerbated by changes in weather.  She describes a busy lifestyle, working multiple jobs, including as a Engineer, petroleum, and is also pursuing a bachelor's degree. She mentions feeling stressed and overwhelmed at times.   Otherwise, there have been no changes to her past medical history, surgical history, family history, or social  history.    Review of systems otherwise negative other than that mentioned in the HPI.    Objective:   Blood pressure (!) 128/90, pulse 67, temperature 98.3 F (36.8 C), temperature source Temporal, resp. rate 16, height 4' 11 (1.499 m), weight 224 lb 9.6 oz (101.9 kg), SpO2 99%. Body mass index is 45.36 kg/m.    Physical Exam Vitals reviewed.  Constitutional:      Appearance: She is well-developed.     Comments: Boisterous. Delightful as always. Bubbly.   HENT:     Head: Normocephalic and atraumatic.     Right Ear: Tympanic membrane, ear canal and external ear normal.     Left Ear: Tympanic membrane, ear canal and external ear normal.     Nose: No nasal deformity, septal deviation, mucosal edema or rhinorrhea.     Right Turbinates: Enlarged, swollen and pale.     Left Turbinates: Enlarged, swollen and pale.     Right Sinus: No maxillary sinus tenderness or frontal sinus tenderness.     Left Sinus: No maxillary sinus tenderness or frontal sinus tenderness.     Comments: No polyps. No rhinorrhea today.     Mouth/Throat:     Mouth: Mucous membranes are not pale and not dry.     Pharynx: Uvula  midline.  Eyes:     General: Lids are normal. Allergic shiner present.        Right eye: No discharge.        Left eye: No discharge.     Conjunctiva/sclera: Conjunctivae normal.     Right eye: Right conjunctiva is not injected. No chemosis.    Left eye: Left conjunctiva is not injected. No chemosis.    Pupils: Pupils are equal, round, and reactive to light.  Cardiovascular:     Rate and Rhythm: Normal rate and regular rhythm.     Heart sounds: Normal heart sounds.  Pulmonary:     Effort: No tachypnea, accessory muscle usage, respiratory distress or retractions.     Breath sounds: No decreased breath sounds, wheezing, rhonchi or rales.     Comments: Moving air well in all lung fields.  No increased work of breathing. Chest:     Chest wall: No tenderness.  Lymphadenopathy:      Cervical: No cervical adenopathy.  Skin:    General: Skin is warm.     Capillary Refill: Capillary refill takes less than 2 seconds.     Coloration: Skin is not pale.     Findings: No abrasion, erythema, petechiae or rash. Rash is not papular, urticarial or vesicular.  Neurological:     Mental Status: She is alert.  Psychiatric:        Behavior: Behavior is cooperative.      Diagnostic studies:    Spirometry: results normal (FEV1: 0.95/56%, FVC: 1.35/64%, FEV1/FVC: 70%).    Spirometry consistent with possible restrictive disease.   Allergy Studies: none       Marty Shaggy, MD  [facility] 

## 2024-08-02 ENCOUNTER — Emergency Department (HOSPITAL_BASED_OUTPATIENT_CLINIC_OR_DEPARTMENT_OTHER)

## 2024-08-02 ENCOUNTER — Emergency Department (HOSPITAL_BASED_OUTPATIENT_CLINIC_OR_DEPARTMENT_OTHER)
Admission: EM | Admit: 2024-08-02 | Discharge: 2024-08-02 | Disposition: A | Discharge status: Home / Self Care | Attending: Emergency Medicine | Admitting: Emergency Medicine

## 2024-08-02 ENCOUNTER — Encounter (HOSPITAL_BASED_OUTPATIENT_CLINIC_OR_DEPARTMENT_OTHER): Payer: Self-pay | Admitting: Emergency Medicine

## 2024-08-02 ENCOUNTER — Other Ambulatory Visit: Payer: Self-pay

## 2024-08-02 DIAGNOSIS — N201 Calculus of ureter: Secondary | ICD-10-CM

## 2024-08-02 DIAGNOSIS — Z23 Encounter for immunization: Secondary | ICD-10-CM | POA: Diagnosis not present

## 2024-08-02 DIAGNOSIS — Z79899 Other long term (current) drug therapy: Secondary | ICD-10-CM | POA: Diagnosis not present

## 2024-08-02 DIAGNOSIS — I10 Essential (primary) hypertension: Secondary | ICD-10-CM | POA: Insufficient documentation

## 2024-08-02 DIAGNOSIS — R1021 Pelvic and perineal pain right side: Secondary | ICD-10-CM | POA: Diagnosis present

## 2024-08-02 DIAGNOSIS — N23 Unspecified renal colic: Secondary | ICD-10-CM

## 2024-08-02 DIAGNOSIS — N132 Hydronephrosis with renal and ureteral calculous obstruction: Secondary | ICD-10-CM | POA: Diagnosis not present

## 2024-08-02 DIAGNOSIS — E119 Type 2 diabetes mellitus without complications: Secondary | ICD-10-CM | POA: Insufficient documentation

## 2024-08-02 DIAGNOSIS — Z7984 Long term (current) use of oral hypoglycemic drugs: Secondary | ICD-10-CM | POA: Diagnosis not present

## 2024-08-02 LAB — CBC WITH DIFFERENTIAL/PLATELET
Abs Immature Granulocytes: 0.02 K/uL (ref 0.00–0.07)
Basophils Absolute: 0 K/uL (ref 0.0–0.1)
Basophils Relative: 1 %
Eosinophils Absolute: 0.1 K/uL (ref 0.0–0.5)
Eosinophils Relative: 2 %
HCT: 37 % (ref 36.0–46.0)
Hemoglobin: 11.3 g/dL — ABNORMAL LOW (ref 12.0–15.0)
Immature Granulocytes: 0 %
Lymphocytes Relative: 30 %
Lymphs Abs: 1.7 K/uL (ref 0.7–4.0)
MCH: 21.2 pg — ABNORMAL LOW (ref 26.0–34.0)
MCHC: 30.5 g/dL (ref 30.0–36.0)
MCV: 69.5 fL — ABNORMAL LOW (ref 80.0–100.0)
Monocytes Absolute: 0.4 K/uL (ref 0.1–1.0)
Monocytes Relative: 6 %
Neutro Abs: 3.6 K/uL (ref 1.7–7.7)
Neutrophils Relative %: 61 %
Platelets: 281 K/uL (ref 150–400)
RBC: 5.32 MIL/uL — ABNORMAL HIGH (ref 3.87–5.11)
RDW: 15.3 % (ref 11.5–15.5)
WBC: 5.8 K/uL (ref 4.0–10.5)
nRBC: 0 % (ref 0.0–0.2)

## 2024-08-02 LAB — COMPREHENSIVE METABOLIC PANEL WITH GFR
ALT: 10 U/L (ref 0–44)
AST: 17 U/L (ref 15–41)
Albumin: 4.3 g/dL (ref 3.5–5.0)
Alkaline Phosphatase: 66 U/L (ref 38–126)
Anion gap: 10 (ref 5–15)
BUN: 16 mg/dL (ref 8–23)
CO2: 26 mmol/L (ref 22–32)
Calcium: 10 mg/dL (ref 8.9–10.3)
Chloride: 105 mmol/L (ref 98–111)
Creatinine, Ser: 0.96 mg/dL (ref 0.44–1.00)
GFR, Estimated: >60 mL/min (ref >60)
Glucose, Bld: 106 mg/dL — ABNORMAL HIGH (ref 70–99)
Potassium: 3.5 mmol/L (ref 3.5–5.1)
Sodium: 142 mmol/L (ref 135–145)
Total Bilirubin: 0.3 mg/dL (ref 0.0–1.2)
Total Protein: 7.3 g/dL (ref 6.5–8.1)

## 2024-08-02 LAB — URINALYSIS, ROUTINE W REFLEX MICROSCOPIC
Bilirubin Urine: NEGATIVE
Glucose, UA: NEGATIVE mg/dL
Ketones, ur: NEGATIVE mg/dL
Leukocytes,Ua: NEGATIVE
Nitrite: NEGATIVE
Protein, ur: NEGATIVE mg/dL
Specific Gravity, Urine: 1.025 (ref 1.005–1.030)
pH: 5.5 (ref 5.0–8.0)

## 2024-08-02 LAB — URINALYSIS, MICROSCOPIC (REFLEX): RBC / HPF: >50 RBC/hpf (ref 0–5)

## 2024-08-02 MED ORDER — IBUPROFEN 600 MG PO TABS: 600.0000 mg | ORAL_TABLET | Freq: Four times a day (QID) | ORAL | 0 refills | Status: DC | PRN

## 2024-08-02 MED ORDER — TAMSULOSIN HCL 0.4 MG PO CAPS: 0.4000 mg | ORAL_CAPSULE | Freq: Every day | ORAL | 0 refills | Status: DC

## 2024-08-02 MED ORDER — HYDROCODONE-ACETAMINOPHEN 5-325 MG PO TABS: 1.0000 | ORAL_TABLET | ORAL | 0 refills | Status: DC | PRN

## 2024-08-02 MED ORDER — KETOROLAC TROMETHAMINE 15 MG/ML IJ SOLN
15.0000 mg | Freq: Once | INTRAMUSCULAR | Status: AC
  Administered 2024-08-02: 15 mg via INTRAVENOUS
  Filled 2024-08-02: qty 1

## 2024-08-02 NOTE — Discharge Instructions (Addendum)
 As discussed, please follow-up with urology as referred, continue take medications as prescribed, return to the ED if you start noticing large volumes of bright red blood in your urine or have an increase in your pain despite the medications that we are giving you today.  Otherwise you can follow-up with urology and your primary care as previously discussed.

## 2024-08-02 NOTE — ED Provider Notes (Signed)
 [facility] Provider Note   CSN: [id number] Arrival date & time: 08/02/24  1210     Patient presents with: Pelvic Pain   Mackenzie Weber is a 63 y.o. female who is presenting today with right sided groin/pelvic pain, also has right-sided back pain.  Did note that she had some hematuria 2 to 3 days prior, and had an abrupt onset of groin/flank pain that began this morning.  Has progressively worsened throughout the day.  Does not have any associated nausea and has tolerated oral intake as recently as 1130 this morning.  No prior history of kidney stones, medical history demonstrates type 2 diabetes, irritable bowel syndrome, left-sided sciatica, primary hypertension.    Pelvic Pain       Prior to Admission medications   Medication Sig Start Date End Date Taking? Authorizing Provider  HYDROcodone -acetaminophen  (NORCO/VICODIN) 5-325 MG tablet Take 1-2 tablets by mouth every 4 (four) hours as needed for up to 3 days. 08/02/24 08/05/24 Yes Myriam Dorn BROCKS, PA  ibuprofen  (ADVIL ) 600 MG tablet Take 1 tablet (600 mg total) by mouth every 6 (six) hours as needed. 08/02/24  Yes Myriam Dorn BROCKS, PA  tamsulosin (FLOMAX) 0.4 MG CAPS capsule Take 1 capsule (0.4 mg total) by mouth daily. 08/02/24  Yes Myriam Dorn BROCKS, PA  albuterol  (VENTOLIN  HFA) 108 (90 Base) MCG/ACT inhaler Inhale 2 puffs into the lungs every 4 (four) hours as needed for wheezing or shortness of breath. 10/14/23   Cari Arlean HERO, FNP  Albuterol -Budesonide  (AIRSUPRA ) 90-80 MCG/ACT AERO Inhale 2 puffs into the lungs as needed (maximum 12 puffs/day). 10/12/22   Marinda Rocky SAILOR, MD  azelastine  (ASTELIN ) 0.1 % nasal spray 1-2 sprays  each nostril twice daily as needed 10/14/23   Cari Arlean HERO, FNP  blood glucose meter kit and supplies KIT Check blood sugar once daily. One Touch Verio Reflect Meter 06/30/19   Nche, Roselie Rockford, NP  Budeson-Glycopyrrol-Formoterol  (BREZTRI  AEROSPHERE)  160-9-4.8 MCG/ACT AERO Inhale 2 puffs into the lungs daily. Use with spacer. Rinse, gargle and spit out after use. 10/14/23   Cari Arlean HERO, FNP  clobetasol ointment (TEMOVATE) 0.05 % Apply 1 Application topically 2 (two) times daily. For 7 days Patient not taking: Reported on 07/28/2024    [provider]  EPINEPHrine  (AUVI-Q ) 0.3 mg/0.3 mL IJ SOAJ injection Inject 0.3 mg into the muscle as needed for anaphylaxis. 10/18/20   Cheryl Reusing, FNP  estradiol  (ESTRACE ) 0.1 MG/GM vaginal cream Place 1 Applicatorful vaginally once a week. 05/10/20   [provider]  famotidine  (PEPCID ) 20 MG tablet TAKE 1 TABLET BY MOUTH AT BEDTIME 11/28/23   Jeneal Danita Macintosh, MD  fluticasone  (FLONASE ) 50 MCG/ACT nasal spray 2 sprays in each nostril once a day as needed for a stuffy nose 10/14/23   Ambs, Arlean HERO, FNP  furosemide  (LASIX ) 20 MG tablet Take 1 tablet (20 mg total) by mouth daily. 07/17/24   Nche, Roselie Rockford, NP  glucose blood (ONETOUCH VERIO) test strip Use as instructed 12/13/22   Nche, Roselie Rockford, NP  guaiFENesin -codeine  100-10 MG/5ML syrup Take 10 mLs by mouth 3 (three) times daily as needed for cough. 04/07/24   Iva Marty Saltness, MD  ipratropium (ATROVENT ) 0.06 % nasal spray Place 2 sprays into both nostrils 4 (four) times daily as needed for rhinitis. 11/21/23   Jeneal Danita Macintosh, MD  Lancets [facility] DELICA PLUS LANCET33G) MISC USE 1  TO CHECK GLUCOSE ONCE DAILY FOR BLOOD SUGAR 12/13/22  Nche, Roselie Rockford, NP  losartan  (COZAAR ) 100 MG tablet Take 1 tablet (100 mg total) by mouth daily. 02/19/24   Nche, Roselie Rockford, NP  lubiprostone  (AMITIZA ) 8 MCG capsule Take 1 capsule (8 mcg total) by mouth 2 (two) times daily with a meal. 08/22/23   Nche, Roselie Rockford, NP  metFORMIN  (GLUCOPHAGE -XR) 500 MG 24 hr tablet Take 1 tablet by mouth once daily with breakfast 08/22/23   Nche, Charlotte Lum, NP  Multiple Vitamin (MULTIVITAMIN) capsule Take 1 capsule by mouth daily.     [provider]  pantoprazole  (PROTONIX ) 20 MG tablet Take 2 tablets (40 mg total) by mouth daily. 11/25/23   Nche, Roselie Rockford, NP  rosuvastatin  (CRESTOR ) 10 MG tablet Take 1 tablet (10 mg total) by mouth daily. 11/25/23   Nche, Roselie Rockford, NP  tirzepatide  (MOUNJARO ) 5 MG/0.5ML Pen Inject 5 mg into the skin once a week. 02/18/24   Nche, Roselie Rockford, NP  triamterene -hydrochlorothiazide (MAXZIDE) 75-50 MG tablet Take 1/2 (one-half) tablet by mouth once daily 07/02/24   Nche, Roselie Rockford, NP    Allergies: Aspirin    Review of Systems  Genitourinary:  Positive for pelvic pain.  All other systems reviewed and are negative.   Updated Vital Signs BP 132/89   Pulse 65   Temp (!) 97 F (36.1 C)   Resp 20   Ht 4' 11 (1.499 m)   Wt 101.9 kg   SpO2 98%   BMI 45.36 kg/m   Physical Exam Vitals and nursing note reviewed.  Constitutional:      General: She is not in acute distress.    Appearance: Normal appearance.  HENT:     Head: Normocephalic and atraumatic.     Mouth/Throat:     Mouth: Mucous membranes are moist.     Pharynx: Oropharynx is clear.  Eyes:     Extraocular Movements: Extraocular movements intact.     Conjunctiva/sclera: Conjunctivae normal.     Pupils: Pupils are equal, round, and reactive to light.  Cardiovascular:     Rate and Rhythm: Normal rate and regular rhythm.     Pulses: Normal pulses.     Heart sounds: Normal heart sounds. No murmur heard.    No friction rub. No gallop.  Pulmonary:     Effort: Pulmonary effort is normal.     Breath sounds: Normal breath sounds.  Abdominal:     General: Abdomen is flat. Bowel sounds are normal.     Palpations: Abdomen is soft.     Tenderness: There is no abdominal tenderness. There is right CVA tenderness. There is no left CVA tenderness.  Musculoskeletal:        General: Normal range of motion.     Cervical back: Normal range of motion and neck supple.     Right lower leg: No edema.     Left lower leg:  No edema.  Skin:    General: Skin is warm and dry.     Capillary Refill: Capillary refill takes less than 2 seconds.  Neurological:     General: No focal deficit present.     Mental Status: She is alert. Mental status is at baseline.  Psychiatric:        Mood and Affect: Mood normal.     (all labs ordered are listed, but only abnormal results are displayed) Labs Reviewed  CBC WITH DIFFERENTIAL/PLATELET - Abnormal; Notable for the following components:      Result Value   RBC 5.32 (*)  Hemoglobin 11.3 (*)    MCV 69.5 (*)    MCH 21.2 (*)    All other components within normal limits  COMPREHENSIVE METABOLIC PANEL WITH GFR - Abnormal; Notable for the following components:   Glucose, Bld 106 (*)    All other components within normal limits  URINALYSIS, ROUTINE W REFLEX MICROSCOPIC - Abnormal; Notable for the following components:   APPearance CLOUDY (*)    Hgb urine dipstick LARGE (*)    All other components within normal limits  URINALYSIS, MICROSCOPIC (REFLEX) - Abnormal; Notable for the following components:   Bacteria, UA RARE (*)    All other components within normal limits    EKG: None  Radiology: CT Renal Stone Study Result Date: 08/02/2024 CLINICAL DATA:  Hematuria, abdominal and flank pain EXAM: CT ABDOMEN AND PELVIS WITHOUT CONTRAST TECHNIQUE: Multidetector CT imaging of the abdomen and pelvis was performed following the standard protocol without IV contrast. RADIATION DOSE REDUCTION: This exam was performed according to the departmental dose-optimization program which includes automated exposure control, adjustment of the mA and/or kV according to patient size and/or use of iterative reconstruction technique. COMPARISON:  11/01/2021 FINDINGS: Lower chest: No acute pleural or parenchymal lung disease. Hepatobiliary: Subcapsular right lobe liver cyst again noted. Otherwise unremarkable unenhanced appearance of the liver. Prior cholecystectomy. No biliary duct dilation.  Pancreas: Unremarkable unenhanced appearance. Spleen: Unremarkable unenhanced appearance. Adrenals/Urinary Tract: There is an obstructing 2 mm calculus at the right UVJ, reference image 59/2, with moderate right-sided hydronephrosis and hydroureter. The left kidney is unremarkable. The adrenals are normal. Bladder is decompressed, limiting its evaluation. Stomach/Bowel: No bowel obstruction or ileus. Prior appendectomy. Previous small bowel resection and reanastomosis. No bowel wall thickening or inflammatory change. Vascular/Lymphatic: Aortic atherosclerosis. No enlarged abdominal or pelvic lymph nodes. Reproductive: Status post hysterectomy. No adnexal masses. Other: No free fluid or free intraperitoneal gas. No abdominal wall hernia. Musculoskeletal: No acute or destructive bony abnormalities. Reconstructed images demonstrate no additional findings. IMPRESSION: 1. Obstructing 2 mm right UVJ calculus, with moderate right-sided hydronephrosis and hydroureter. 2.  Aortic Atherosclerosis (ICD10-I70.0). Electronically Signed   By: Ozell Daring M.D.   On: 08/02/2024 16:50     Procedures   Medications Ordered in the ED  ketorolac  (TORADOL ) 15 MG/ML injection 15 mg (15 mg Intravenous Given 08/02/24 1610)                                    Medical Decision Making Amount and/or Complexity of Data Reviewed Labs: ordered. Radiology: ordered.  Risk Prescription drug management.   Medical Decision Making:   Mackenzie Weber is a 63 y.o. female who presented to the ED today with right-sided groin/flank pain.  Detailed above.     Complete initial physical exam performed, notably the patient  was awake alert in no apparent distress however visibly uncomfortable.  Physical exam is positive for CVA tenderness on the right otherwise unremarkable..    Reviewed and confirmed nursing documentation for past medical history, family history, social history.    Initial Assessment:   With the patient's  presentation of right groin pain/right flank pain, most likely diagnosis is nephro/urolithiasis.  Further consider pyelonephritis/cystitis, groin strain, gynecologic etiology of groin pain.  Initial Plan:  CT renal stone study to evaluate for nephro/urolithiasis. Screening labs including CBC and Metabolic panel to evaluate for infectious or metabolic etiology of disease.  Urinalysis with reflex culture ordered to evaluate for  UTI or relevant urologic/nephrologic pathology.  Objective evaluation as below reviewed   Initial Study Results:   Laboratory  All laboratory results reviewed without evidence of clinically relevant pathology.   Exceptions include: Large hematuria on UA, hemoglobin is 11.3 however review of previous findings this is stable.  Also has a decreased MCV, prior diagnosis of iron deficiency anemia.  Radiology:  All images reviewed independently. Agree with radiology report at this time.   CT Renal Stone Study Result Date: 08/02/2024 CLINICAL DATA:  Hematuria, abdominal and flank pain EXAM: CT ABDOMEN AND PELVIS WITHOUT CONTRAST TECHNIQUE: Multidetector CT imaging of the abdomen and pelvis was performed following the standard protocol without IV contrast. RADIATION DOSE REDUCTION: This exam was performed according to the departmental dose-optimization program which includes automated exposure control, adjustment of the mA and/or kV according to patient size and/or use of iterative reconstruction technique. COMPARISON:  11/01/2021 FINDINGS: Lower chest: No acute pleural or parenchymal lung disease. Hepatobiliary: Subcapsular right lobe liver cyst again noted. Otherwise unremarkable unenhanced appearance of the liver. Prior cholecystectomy. No biliary duct dilation. Pancreas: Unremarkable unenhanced appearance. Spleen: Unremarkable unenhanced appearance. Adrenals/Urinary Tract: There is an obstructing 2 mm calculus at the right UVJ, reference image 59/2, with moderate right-sided  hydronephrosis and hydroureter. The left kidney is unremarkable. The adrenals are normal. Bladder is decompressed, limiting its evaluation. Stomach/Bowel: No bowel obstruction or ileus. Prior appendectomy. Previous small bowel resection and reanastomosis. No bowel wall thickening or inflammatory change. Vascular/Lymphatic: Aortic atherosclerosis. No enlarged abdominal or pelvic lymph nodes. Reproductive: Status post hysterectomy. No adnexal masses. Other: No free fluid or free intraperitoneal gas. No abdominal wall hernia. Musculoskeletal: No acute or destructive bony abnormalities. Reconstructed images demonstrate no additional findings. IMPRESSION: 1. Obstructing 2 mm right UVJ calculus, with moderate right-sided hydronephrosis and hydroureter. 2.  Aortic Atherosclerosis (ICD10-I70.0). Electronically Signed   By: Ozell Daring M.D.   On: 08/02/2024 16:50   Reassessment and Plan:   Based on the presenting symptoms as well as hematuria, CT renal was obtained which does demonstrate a 2 mm obstructing calculus at the right ureterovesicular junction.  There is associated hydronephrosis and hydroureter.  Given this finding we will begin management with tamsulosin, pain management with ibuprofen  and also provide with a very short course of hydrocodone  for breakthrough pain.  Further provide referral for urology follow-up, and encouraged primary care follow-up within the next 2 weeks.  Careful return precautions given, patient understands and agrees has no further concerns at this time.  As there are no concerning findings on physical exam nor on the workup, and she is otherwise stable will discharge with outpatient follow-up at this time.       Final diagnoses:  Calculus of ureter  Renal colic on right side    ED Discharge Orders          Ordered    ibuprofen  (ADVIL ) 600 MG tablet  Every 6 hours PRN        08/02/24 1702    HYDROcodone -acetaminophen  (NORCO/VICODIN) 5-325 MG tablet  Every 4 hours PRN         08/02/24 1702    tamsulosin (FLOMAX) 0.4 MG CAPS capsule  Daily        08/02/24 1702               Myriam Dorn BROCKS, [place] 08/02/24 1716    Cottie Donnice PARAS, MD 08/02/24 1719

## 2024-08-02 NOTE — ED Triage Notes (Signed)
 Pt was seen at [facility] yesterday for hematuria; today she has lower mid pelvic pain (no UTI per pt)

## 2024-08-02 NOTE — ED Notes (Signed)
 Pt alert and oriented X 4 at the time of discharge. RR even and unlabored. No acute distress noted. Pt verbalized understanding of discharge instructions as discussed. Pt ambulatory to lobby at time of discharge.

## 2024-08-20 ENCOUNTER — Encounter: Payer: Self-pay | Admitting: Nurse Practitioner

## 2024-08-20 ENCOUNTER — Ambulatory Visit: Admitting: Nurse Practitioner

## 2024-08-20 VITALS — BP 143/89 | HR 64 | Temp 96.9°F | Ht 59.5 in | Wt 221.4 lb

## 2024-08-20 DIAGNOSIS — Z87442 Personal history of urinary calculi: Secondary | ICD-10-CM | POA: Insufficient documentation

## 2024-08-20 DIAGNOSIS — K219 Gastro-esophageal reflux disease without esophagitis: Secondary | ICD-10-CM

## 2024-08-20 DIAGNOSIS — E785 Hyperlipidemia, unspecified: Secondary | ICD-10-CM | POA: Diagnosis not present

## 2024-08-20 DIAGNOSIS — Z23 Encounter for immunization: Secondary | ICD-10-CM | POA: Diagnosis not present

## 2024-08-20 DIAGNOSIS — Z7984 Long term (current) use of oral hypoglycemic drugs: Secondary | ICD-10-CM

## 2024-08-20 DIAGNOSIS — D509 Iron deficiency anemia, unspecified: Secondary | ICD-10-CM

## 2024-08-20 DIAGNOSIS — Z780 Asymptomatic menopausal state: Secondary | ICD-10-CM

## 2024-08-20 DIAGNOSIS — K5904 Chronic idiopathic constipation: Secondary | ICD-10-CM

## 2024-08-20 DIAGNOSIS — Z0000 Encounter for general adult medical examination without abnormal findings: Secondary | ICD-10-CM

## 2024-08-20 DIAGNOSIS — Z0001 Encounter for general adult medical examination with abnormal findings: Secondary | ICD-10-CM

## 2024-08-20 DIAGNOSIS — I10 Essential (primary) hypertension: Secondary | ICD-10-CM

## 2024-08-20 DIAGNOSIS — E1169 Type 2 diabetes mellitus with other specified complication: Secondary | ICD-10-CM

## 2024-08-20 LAB — LIPID PANEL
Cholesterol: 116 mg/dL (ref 0–200)
HDL: 54.4 mg/dL (ref >39.00)
LDL Cholesterol: 52 mg/dL (ref 0–99)
NonHDL: 61.67
Total CHOL/HDL Ratio: 2
Triglycerides: 48 mg/dL (ref 0.0–149.0)
VLDL: 9.6 mg/dL (ref 0.0–40.0)

## 2024-08-20 LAB — HEMOGLOBIN A1C: Hgb A1c MFr Bld: 6.8 % — ABNORMAL HIGH (ref 4.6–6.5)

## 2024-08-20 MED ORDER — LOSARTAN POTASSIUM 100 MG PO TABS: 100.0000 mg | ORAL_TABLET | Freq: Every day | ORAL | 3 refills | Status: AC

## 2024-08-20 MED ORDER — METFORMIN HCL ER 500 MG PO TB24: ORAL_TABLET | ORAL | 3 refills | Status: AC

## 2024-08-20 MED ORDER — MOUNJARO 5 MG/0.5ML ~~LOC~~ SOAJ: 5.0000 mg | SUBCUTANEOUS | 1 refills | Status: AC

## 2024-08-20 MED ORDER — PANTOPRAZOLE SODIUM 20 MG PO TBEC: 40.0000 mg | DELAYED_RELEASE_TABLET | Freq: Every day | ORAL | 1 refills | Status: AC

## 2024-08-20 MED ORDER — LUBIPROSTONE 8 MCG PO CAPS: 8.0000 ug | ORAL_CAPSULE | Freq: Two times a day (BID) | ORAL | 1 refills | Status: AC

## 2024-08-20 NOTE — Patient Instructions (Signed)
 Go to lab Maintain Heart healthy diet and daily exercise. Maintain current medications.  Mediterranean Diet A Mediterranean diet is based on the traditions of countries on the [employer]. It focuses on eating more: Fruits and vegetables. Whole grains, beans, nuts, and seeds. Heart-healthy fats. These are fats that are good for your heart. It involves eating less: Dairy. Meat and eggs. Processed foods with added sugar, salt, and fat. This type of diet can help prevent certain conditions. It can also improve outcomes if you have a long-term (chronic) disease, such as kidney or heart disease. What are tips for following this plan? Reading food labels Check packaged foods for: The serving size. For foods such as rice and pasta, the serving size is the amount of cooked product, not dry. The total fat. Avoid foods with saturated fat or trans fat. Added sugars, such as corn syrup. Shopping  Try to have a balanced diet. Buy a variety of foods, such as: Fresh fruits and vegetables. You may be able to get these from local farmers markets. You can also buy them frozen. Grains, beans, nuts, and seeds. Some of these can be bought in bulk. Fresh seafood. Poultry and eggs. Low-fat dairy products. Buy whole ingredients instead of foods that have already been packaged. If you can't get fresh seafood, buy precooked frozen shrimp or canned fish, such as tuna, salmon, or sardines. Stock your pantry so you always have certain foods on hand, such as olive oil, canned tuna, canned tomatoes, rice, pasta, and beans. Cooking Cook foods with extra-virgin olive oil instead of using butter or other vegetable oils. Have meat as a side dish. Have vegetables or grains as your main dish. This means having meat in small portions or adding small amounts of meat to foods like pasta or stew. Use beans or vegetables instead of meat in common dishes like chili or lasagna. Try out different cooking methods. Try  roasting, broiling, steaming, and sauting vegetables. Add frozen vegetables to soups, stews, pasta, or rice. Add nuts or seeds for added healthy fats and plant protein at each meal. You can add these to yogurt, salads, or vegetable dishes. Marinate fish or vegetables using olive oil, lemon juice, garlic, and fresh herbs. Meal planning Plan to eat a vegetarian meal one day each week. Try to work up to two vegetarian meals, if possible. Eat seafood two or more times a week. Have healthy snacks on hand. These may include: Vegetable sticks with hummus. Greek yogurt. Fruit and nut trail mix. Eat balanced meals. These should include: Fruit: 2-3 servings a day. Vegetables: 4-5 servings a day. Low-fat dairy: 2 servings a day. Fish, poultry, or lean meat: 1 serving a day. Beans and legumes: 2 or more servings a week. Nuts and seeds: 1-2 servings a day. Whole grains: 6-8 servings a day. Extra-virgin olive oil: 3-4 servings a day. Limit red meat and sweets to just a few servings a month. Lifestyle  Try to cook and eat meals with your family. Drink enough fluid to keep your pee (urine) pale yellow. Be active every day. This includes: Aerobic exercise, which is exercise that causes your heart to beat faster. Examples include running and swimming. Leisure activities like gardening, walking, or housework. Get 7-8 hours of sleep each night. Drink red wine if your provider says you can. A glass of wine is 5 oz (150 mL). You may be allowed to have: Up to 1 glass a day if you're female and not pregnant. Up to 2 glasses  a day if you're female. What foods should I eat? Fruits Apples. Apricots. Avocado. Berries. Bananas. Cherries. Dates. Figs. Grapes. Lemons. Melon. Oranges. Peaches. Plums. Pomegranate. Vegetables Artichokes. Beets. Broccoli. Cabbage. Carrots. Eggplant. Green beans. Chard. Kale. Spinach. Onions. Leeks. Peas. Squash. Tomatoes. Peppers. Radishes. Grains Whole-grain pasta. Brown rice.  Bulgur wheat. Polenta. Couscous. Whole-wheat bread. Orpah Cobb. Meats and other proteins Beans. Almonds. Sunflower seeds. Pine nuts. Peanuts. Cod. Salmon. Scallops. Shrimp. Tuna. Tilapia. Clams. Oysters. Eggs. Chicken or [place] without skin. Dairy Low-fat milk. Cheese. Greek yogurt. Fats and oils Extra-virgin olive oil. Avocado oil. Grapeseed oil. Beverages Water. Red wine. Herbal tea. Sweets and desserts Greek yogurt with honey. Baked apples. Poached pears. Trail mix. Seasonings and condiments Basil. Cilantro. Coriander. Cumin. Mint. Parsley. Sage. Rosemary. Tarragon. Garlic. Oregano. Thyme. Pepper. Balsamic vinegar. Tahini. Hummus. Tomato sauce. Olives. Mushrooms. The items listed above may not be all the foods and drinks you can have. Talk to a dietitian to learn more. What foods should I limit? This is a list of foods that should be eaten rarely. Fruits Fruit canned in syrup. Vegetables Deep-fried potatoes, like [place] fries. Grains Packaged pasta or rice dishes. Cereal with added sugar. Snacks with added sugar. Meats and other proteins Beef. Pork. Lamb. Chicken or [place] with skin. Hot dogs. Tomasa Blase. Dairy Ice cream. Sour cream. Whole milk. Fats and oils Butter. Canola oil. Vegetable oil. Beef fat (tallow). Lard. Beverages Juice. Sugar-sweetened soft drinks. Beer. Liquor and spirits. Sweets and desserts Cookies. Cakes. Pies. Candy. Seasonings and condiments Mayonnaise. Pre-made sauces and marinades. The items listed above may not be all the foods and drinks you should limit. Talk to a dietitian to learn more. Where to find more information American Heart Association (AHA): heart.org This information is not intended to replace advice given to you by your health care provider. Make sure you discuss any questions you have with your health care provider. Document Revised: 01/13/2023 Document Reviewed: 01/13/2023 Elsevier Patient Education  2024 [employer].

## 2024-08-20 NOTE — Assessment & Plan Note (Signed)
 No adverse effects with mounjaro  5mg  No neuropathy UTD with Dm eye exam-report requested  LDL at goal with crestor  Normal previous UACr Current use of GLP-1RA and ARB  Repeat hgbA1c, and lipid panel Advised to maintain daily exercise and heart healthy diet F/up in 3months

## 2024-08-20 NOTE — Assessment & Plan Note (Addendum)
 Use of maxzide 1/2tab 3x/week Daily use of losartan  Elevated BP this Am due to missed AM dose BP Readings from Last 3 Encounters:  08/20/24 (!) 143/89  08/02/24 132/89  07/28/24 (!) 128/90    Advised to take med as prescribed, maintain DASH diet F/up in 3months

## 2024-08-20 NOTE — Progress Notes (Signed)
 Complete physical exam  Patient: Mackenzie Weber   DOB: Mar 18, 1961   63 y.o. Female  MRN: [id number] Visit Date: 08/20/2024  Subjective:    Chief Complaint  Patient presents with   Annual Exam    FASTING  Refill needed for Losartan   DUE for Pneumococcal and Zoster vaccines Requested Eye Exam    Mackenzie Weber is a 63 y.o. female who presents today for a complete physical exam. She reports consuming a low fat and low sodium diet. Walking daily She generally feels well. She reports sleeping well. She does have additional problems to discuss today.  Vision:Yes Dental:Yes STD Screen:No  BP Readings from Last 3 Encounters:  08/20/24 (!) 143/89  08/02/24 132/89  07/28/24 (!) 128/90   Wt Readings from Last 3 Encounters:  08/20/24 221 lb 6.4 oz (100.4 kg)  08/02/24 224 lb 9.6 oz (101.9 kg)  07/28/24 224 lb 9.6 oz (101.9 kg)   Most recent fall risk assessment:    08/20/2024    8:47 AM  Fall Risk   Falls in the past year? 0  Number falls in past yr: 0  Injury with Fall? 0  Risk for fall due to : No Fall Risks  Follow up Falls evaluation completed   Depression screen:Yes - No Depression Most recent depression screenings:    08/20/2024    8:47 AM 08/22/2023    1:54 PM  PHQ 2/9 Scores  PHQ - 2 Score 0 0  PHQ- 9 Score 1       Data saved with a previous flowsheet row definition   HPI  Hypertensive disorder Use of maxzide 1/2tab 3x/week Daily use of losartan  Elevated BP this Am due to missed AM dose BP Readings from Last 3 Encounters:  08/20/24 (!) 143/89  08/02/24 132/89  07/28/24 (!) 128/90    Advised to take med as prescribed, maintain DASH diet F/up in 3months  DM (diabetes mellitus) (HCC) No adverse effects with mounjaro  5mg  No neuropathy UTD with Dm eye exam-report requested  LDL at goal with crestor  Normal previous UACr Current use of GLP-1RA and ARB  Repeat hgbA1c, and lipid panel Advised to maintain daily exercise and heart healthy diet F/up in  3months   Past Medical History:  Diagnosis Date   Anemia    Asthma    Diabetes mellitus without complication (HCC)    Foot pain, right    Hypertension    IBS (irritable bowel syndrome)    Morbid obesity (HCC) 04/07/2024   bmi 44.41   Sciatica of left side    Seasonal allergies    Past Surgical History:  Procedure Laterality Date   ABDOMINAL HYSTERECTOMY     APPENDECTOMY     BLADDER SUSPENSION  2012   TVT   BREAST CYST EXCISION     BREAST SURGERY  2001   /biopsy-benign   CHOLECYSTECTOMY     COLONOSCOPY  06/24/2019   [facility]   ESOPHAGOGASTRODUODENOSCOPY  05/12/2018   [facility]    EXCISIONAL HEMORRHOIDECTOMY     KNEE SURGERY     right   SHOULDER SURGERY Left 02/13/2018   Lipoma removed    Small Bowel Surgery     TONSILLECTOMY     TOTAL ABDOMINAL HYSTERECTOMY W/ BILATERAL SALPINGOOPHORECTOMY  1989   LSO-1987; RsO-1998   WRIST SURGERY     carpal tunnel repair   Social History   Socioeconomic History   Marital status: Married    Spouse name: Not on file  Number of children: 2   Years of education: Not on file   Highest education level: Not on file  Occupational History   Occupation: retired in 2015 from [employer]  Tobacco Use   Smoking status: Never    Passive exposure: Never   Smokeless tobacco: Never  Vaping Use   Vaping status: Never Used  Substance and Sexual Activity   Alcohol use: No   Drug use: No   Sexual activity: Yes    Partners: Male    Birth control/protection: Surgical  Other Topics Concern   Not on file  Social History Narrative   Not on file   Social Drivers of Health   Financial Resource Strain: Not on file  Food Insecurity: Not on file  Transportation Needs: Not on file  Physical Activity: Not on file  Stress: Not on file  Social Connections: Not on file  Intimate Partner Violence: Not on file   Family Status  Relation Name Status   Mother Allen Deceased at age 68       Heart Attack   Father Miquel  Deceased at age 64       Heart Attack   Sister Elenor Alive   Sister [place] Alive   Sister Set Designer Alive   Sister [place] Alive   Mat Uncle  Deceased   Nutritional Therapist  (Not Specified)   Brother Marsha Alive   Brother Ivonne Alive   Neg Hx  (Not Specified)  No partnership data on file   Family History  Problem Relation Age of Onset   Diabetes Mother    Hypertension Mother    Asthma Mother    Heart disease Mother    Allergic rhinitis Mother    Diabetes Father    Hypertension Father    Heart disease Father    Hypertension Sister    Diabetes Sister    Breast cancer Sister 31   Colon cancer Maternal Uncle    Hypertension Paternal Uncle    Diabetes Brother    Pancreatic cancer Neg Hx    Stomach cancer Neg Hx    Esophageal cancer Neg Hx    Liver disease Neg Hx    Allergies  Allergen Reactions   Aspirin Anxiety and Other (See Comments)    Rapid Heart beat    Patient Care Team: Andrews Tener, Roselie Rockford, NP as PCP - General (Internal Medicine) Court Dorn PARAS, MD as PCP - Cardiology (Cardiology) Rhoton, Clem PARAS., MD (Gastroenterology) Ob/Gyn, [facility] (Obstetrics and Gynecology) Specialists, [facility] (Orthopedic Surgery) Gastroenterology, [facility] (Gastroenterology)   Medications: Outpatient Medications Prior to Visit  Medication Sig   albuterol  (VENTOLIN  HFA) 108 (90 Base) MCG/ACT inhaler Inhale 2 puffs into the lungs every 4 (four) hours as needed for wheezing or shortness of breath.   Albuterol -Budesonide  (AIRSUPRA ) 90-80 MCG/ACT AERO Inhale 2 puffs into the lungs as needed (maximum 12 puffs/day).   azelastine  (ASTELIN ) 0.1 % nasal spray 1-2 sprays  each nostril twice daily as needed   blood glucose meter kit and supplies KIT Check blood sugar once daily. One Touch Verio Reflect Meter   Budeson-Glycopyrrol-Formoterol  (BREZTRI  AEROSPHERE) 160-9-4.8 MCG/ACT AERO Inhale 2 puffs into the lungs daily. Use with spacer. Rinse, gargle and spit out after use.    EPINEPHrine  (AUVI-Q ) 0.3 mg/0.3 mL IJ SOAJ injection Inject 0.3 mg into the muscle as needed for anaphylaxis.   estradiol  (ESTRACE ) 0.1 MG/GM vaginal cream Place 1 Applicatorful vaginally once a week.   famotidine  (PEPCID ) 20  MG tablet TAKE 1 TABLET BY MOUTH AT BEDTIME   fluticasone  (FLONASE ) 50 MCG/ACT nasal spray 2 sprays in each nostril once a day as needed for a stuffy nose   glucose blood (ONETOUCH VERIO) test strip Use as instructed   ibuprofen  (ADVIL ) 600 MG tablet Take 1 tablet (600 mg total) by mouth every 6 (six) hours as needed.   ipratropium (ATROVENT ) 0.06 % nasal spray Place 2 sprays into both nostrils 4 (four) times daily as needed for rhinitis.   Lancets (ONETOUCH DELICA PLUS LANCET33G) MISC USE 1  TO CHECK GLUCOSE ONCE DAILY FOR BLOOD SUGAR   Multiple Vitamin (MULTIVITAMIN) capsule Take 1 capsule by mouth daily.   rosuvastatin  (CRESTOR ) 10 MG tablet Take 1 tablet (10 mg total) by mouth daily.   triamterene -hydrochlorothiazide (MAXZIDE) 75-50 MG tablet Take 1/2 (one-half) tablet by mouth once daily   [DISCONTINUED] furosemide  (LASIX ) 20 MG tablet Take 1 tablet (20 mg total) by mouth daily.   [DISCONTINUED] guaiFENesin -codeine  100-10 MG/5ML syrup Take 10 mLs by mouth 3 (three) times daily as needed for cough.   [DISCONTINUED] losartan  (COZAAR ) 100 MG tablet Take 1 tablet (100 mg total) by mouth daily.   [DISCONTINUED] lubiprostone  (AMITIZA ) 8 MCG capsule Take 1 capsule (8 mcg total) by mouth 2 (two) times daily with a meal.   [DISCONTINUED] metFORMIN  (GLUCOPHAGE -XR) 500 MG 24 hr tablet Take 1 tablet by mouth once daily with breakfast   [DISCONTINUED] pantoprazole  (PROTONIX ) 20 MG tablet Take 2 tablets (40 mg total) by mouth daily.   [DISCONTINUED] tamsulosin (FLOMAX) 0.4 MG CAPS capsule Take 1 capsule (0.4 mg total) by mouth daily.   [DISCONTINUED] tirzepatide  (MOUNJARO ) 5 MG/0.5ML Pen Inject 5 mg into the skin once a week.   [DISCONTINUED] clobetasol ointment (TEMOVATE) 0.05 %  Apply 1 Application topically 2 (two) times daily. For 7 days (Patient not taking: Reported on 08/20/2024)   No facility-administered medications prior to visit.    Review of Systems  Constitutional:  Negative for activity change, appetite change and unexpected weight change.  Respiratory: Negative.    Cardiovascular: Negative.   Gastrointestinal: Negative.   Endocrine: Negative for cold intolerance and heat intolerance.  Genitourinary: Negative.   Musculoskeletal: Negative.   Skin: Negative.   Neurological: Negative.   Hematological: Negative.   Psychiatric/Behavioral:  Negative for behavioral problems, decreased concentration, dysphoric mood, hallucinations, self-injury, sleep disturbance and suicidal ideas. The patient is not nervous/anxious.     Last CBC Lab Results  Component Value Date   WBC 5.8 08/02/2024   HGB 11.3 (L) 08/02/2024   HCT 37.0 08/02/2024   MCV 69.5 (L) 08/02/2024   MCH 21.2 (L) 08/02/2024   RDW 15.3 08/02/2024   PLT 281 08/02/2024   Last metabolic panel Lab Results  Component Value Date   GLUCOSE 106 (H) 08/02/2024   NA 142 08/02/2024   K 3.5 08/02/2024   CL 105 08/02/2024   CO2 26 08/02/2024   BUN 16 08/02/2024   CREATININE 0.96 08/02/2024   GFRNONAA >60 08/02/2024   CALCIUM  10.0 08/02/2024   PHOS 4.7 (H) 08/22/2023   PROT 7.3 08/02/2024   ALBUMIN 4.3 08/02/2024   LABGLOB 2.9 09/22/2021   AGRATIO 1.6 09/22/2021   BILITOT 0.3 08/02/2024   ALKPHOS 66 08/02/2024   AST 17 08/02/2024   ALT 10 08/02/2024   ANIONGAP 10 08/02/2024   Last lipids Lab Results  Component Value Date   CHOL 129 03/20/2024   HDL 56.20 03/20/2024   LDLCALC 58 03/20/2024   LDLDIRECT 82.0  08/22/2023   TRIG 76.0 03/20/2024   CHOLHDL 2 03/20/2024   Last hemoglobin A1c Lab Results  Component Value Date   HGBA1C 6.6 (H) 03/20/2024      Objective:  BP (!) 143/89 (BP Location: Left Arm, Patient Position: Sitting)   Pulse 64   Temp (!) 96.9 F (36.1 C) (Temporal)    Ht 4' 11.5 (1.511 m)   Wt 221 lb 6.4 oz (100.4 kg)   SpO2 100%   BMI 43.97 kg/m     Physical Exam Vitals and nursing note reviewed.  Constitutional:      General: She is not in acute distress. HENT:     Right Ear: Tympanic membrane, ear canal and external ear normal.     Left Ear: Tympanic membrane, ear canal and external ear normal.     Nose: Nose normal.  Eyes:     Extraocular Movements: Extraocular movements intact.     Conjunctiva/sclera: Conjunctivae normal.     Pupils: Pupils are equal, round, and reactive to light.  Neck:     Thyroid : No thyroid  mass, thyromegaly or thyroid  tenderness.  Cardiovascular:     Rate and Rhythm: Normal rate and regular rhythm.     Pulses: Normal pulses.     Heart sounds: Normal heart sounds.  Pulmonary:     Effort: Pulmonary effort is normal.     Breath sounds: Normal breath sounds.  Abdominal:     General: Bowel sounds are normal.     Palpations: Abdomen is soft.  Musculoskeletal:        General: Normal range of motion.     Cervical back: Normal range of motion and neck supple.     Right lower leg: Edema present.     Left lower leg: Edema present.  Lymphadenopathy:     Cervical: No cervical adenopathy.  Skin:    General: Skin is warm and dry.     Findings: No erythema or rash.  Neurological:     Mental Status: She is alert and oriented to person, place, and time.     Cranial Nerves: No cranial nerve deficit.  Psychiatric:        Mood and Affect: Mood normal.        Behavior: Behavior normal.        Thought Content: Thought content normal.      No results found for any visits on 08/20/24.    Assessment & Plan:    Routine Health Maintenance and Physical Exam  Immunization History  Administered Date(s) Administered   INFLUENZA, HIGH DOSE SEASONAL PF 07/17/2024, 07/17/2024   Influenza Split 11/30/2012   PFIZER Comirnaty(Gray Top)Covid-19 Tri-Sucrose Vaccine 05/22/2021   PFIZER(Purple Top)SARS-COV-2 Vaccination  11/27/2019, 12/14/2019, 12/24/2019, 12/25/2019, 01/12/2020, 01/14/2020, 05/22/2021   PNEUMOCOCCAL CONJUGATE-20 08/20/2024   Pneumococcal Polysaccharide-23 08/11/2020   Tdap 02/12/2017, 04/16/2024    Health Maintenance  Topic Date Due   Hepatitis C Screening  Never done   OPHTHALMOLOGY EXAM  05/17/2023   COVID-19 Vaccine (9 - 2025-26 season) 10/14/2024 (Originally 06/15/2024)   Zoster Vaccines- Shingrix (1 of 2) 11/20/2024 (Originally 02/19/2012)   FOOT EXAM  08/21/2024   HEMOGLOBIN A1C  09/19/2024   Diabetic kidney evaluation - Urine ACR  03/20/2025   Diabetic kidney evaluation - eGFR measurement  08/02/2025   Mammogram  01/01/2026   DTaP/Tdap/Td (3 - Td or Tdap) 04/16/2034   Colonoscopy  06/04/2034   Pneumococcal Vaccine: 50+ Years  Completed   Influenza Vaccine  Completed   HIV Screening  Completed  Hepatitis B Vaccines 19-59 Average Risk  Aged Out   HPV VACCINES  Aged Out   Meningococcal B Vaccine  Aged Out    Discussed health benefits of physical activity, and encouraged her to engage in regular exercise appropriate for her age and condition.  Problem List Items Addressed This Visit     Chronic idiopathic constipation   Relevant Medications   lubiprostone  (AMITIZA ) 8 MCG capsule   DM (diabetes mellitus) (HCC)   No adverse effects with mounjaro  5mg  No neuropathy UTD with Dm eye exam-report requested  LDL at goal with crestor  Normal previous UACr Current use of GLP-1RA and ARB  Repeat hgbA1c, and lipid panel Advised to maintain daily exercise and heart healthy diet F/up in 3months      Relevant Medications   losartan  (COZAAR ) 100 MG tablet   metFORMIN  (GLUCOPHAGE -XR) 500 MG 24 hr tablet   tirzepatide  (MOUNJARO ) 5 MG/0.5ML Pen   Other Relevant Orders   Hemoglobin A1c   GERD (gastroesophageal reflux disease)   Relevant Medications   lubiprostone  (AMITIZA ) 8 MCG capsule   pantoprazole  (PROTONIX ) 20 MG tablet   Hyperlipidemia associated with type 2 diabetes  mellitus (HCC)   Relevant Medications   losartan  (COZAAR ) 100 MG tablet   metFORMIN  (GLUCOPHAGE -XR) 500 MG 24 hr tablet   tirzepatide  (MOUNJARO ) 5 MG/0.5ML Pen   Other Relevant Orders   Lipid panel   Hypertensive disorder   Use of maxzide 1/2tab 3x/week Daily use of losartan  Elevated BP this Am due to missed AM dose BP Readings from Last 3 Encounters:  08/20/24 (!) 143/89  08/02/24 132/89  07/28/24 (!) 128/90    Advised to take med as prescribed, maintain DASH diet F/up in 3months      Relevant Medications   losartan  (COZAAR ) 100 MG tablet   Microcytic anemia   Relevant Orders   Iron, TIBC and Ferritin Panel   Other Visit Diagnoses       Encounter for preventative adult health care exam with abnormal findings    -  Primary     Asymptomatic postmenopausal estrogen deficiency       Relevant Orders   DG Bone Density     Immunization due       Relevant Orders   Pneumococcal conjugate vaccine 20-valent (Prevnar 20) (Completed)      Return in about 3 months (around 11/20/2024) for HTN, DM, hyperlipidemia (fasting).     Roselie Mood, NP

## 2024-08-21 ENCOUNTER — Ambulatory Visit: Payer: Self-pay | Admitting: Nurse Practitioner

## 2024-08-21 LAB — IRON,TIBC AND FERRITIN PANEL
%SAT: 17 % (ref 16–45)
Ferritin: 39 ng/mL (ref 16–288)
Iron: 42 ug/dL — ABNORMAL LOW (ref 45–160)
TIBC: 253 ug/dL (ref 250–450)

## 2024-08-24 ENCOUNTER — Telehealth: Payer: Self-pay

## 2024-08-24 NOTE — Telephone Encounter (Signed)
 Copied from CRM #[id number]. Topic: Clinical - Prescription Issue >> Aug 24, 2024  8:39 AM Lonell PEDLAR wrote: Reason for CRM: Patient saw pcp on 11/6 and was supposed to get a refill for losartan  (COZAAR ) 100 MG tablet. Please review and advise, as pt is out of medication C/b [phone]

## 2024-09-29 ENCOUNTER — Ambulatory Visit: Admitting: Allergy & Immunology

## 2024-10-01 ENCOUNTER — Ambulatory Visit: Admitting: Family Medicine

## 2024-10-05 ENCOUNTER — Ambulatory Visit: Payer: Self-pay

## 2024-10-05 ENCOUNTER — Ambulatory Visit: Admitting: Emergency Medicine

## 2024-10-05 VITALS — BP 136/88 | HR 76 | Ht 59.5 in | Wt 228.0 lb

## 2024-10-05 DIAGNOSIS — B029 Zoster without complications: Secondary | ICD-10-CM | POA: Diagnosis not present

## 2024-10-05 DIAGNOSIS — B0229 Other postherpetic nervous system involvement: Secondary | ICD-10-CM | POA: Diagnosis not present

## 2024-10-05 MED ORDER — LIDOCAINE 5 % EX PTCH: 1.0000 | MEDICATED_PATCH | CUTANEOUS | 0 refills | Status: DC

## 2024-10-05 MED ORDER — HYDROCODONE-ACETAMINOPHEN 5-325 MG PO TABS: 1.0000 | ORAL_TABLET | Freq: Three times a day (TID) | ORAL | 0 refills | Status: DC | PRN

## 2024-10-05 NOTE — Telephone Encounter (Signed)
 FYI Only or Action Required?: Action required by provider: update on patient condition.  Patient was last seen in primary care on 08/20/2024 by Mackenzie Weber, Mackenzie Rockford, NP.  Called Nurse Triage reporting Herpes Zoster.  Symptoms began several weeks ago.  Interventions attempted: Prescription medications: hydrocodone .  Symptoms are: unchanged. Seen in UC for shingles 2 weeks ago. Asking for refill of hydrocodone , just a few. The rash is drying up, but I still have pain. Pain at left shoulder, breast. Please advise pt.  Triage Disposition: See Physician Within 24 Hours  Patient/caregiver understands and will follow disposition?: No, wishes to speak with PCP       Copied from CRM #[id number]. Topic: Clinical - Red Word Triage >> Oct 05, 2024  8:38 AM Viola F wrote: Patient has shingles, says she having left shoulder pain, chest pain, and arm - she wants pain medication sent to [id number] - [place], [place] - [address] >> Oct 05, 2024  9:00 AM Viola F wrote: Patient no longer wanted to hold - please call back  Reason for Disposition  [1] Shingles rash (matches SYMPTOMS) AND [2] onset < 72 hours ago (3 days)  Answer Assessment - Initial Assessment Questions 1. APPEARANCE of RASH: What does the rash look like?      Rash is drying up, seen in UC 2. LOCATION: Where is the rash located?      Left shoulder, breast 3. ONSET: When did the rash start?      2 weeks ago 4. ITCHING: Does the rash itch? If Yes, ask: How bad is the itch?  (Scale 1-10; or mild, moderate, severe)     no 5. PAIN: Does the rash hurt? If Yes, ask: How bad is the pain?  (Scale 0-10; or none, mild, moderate, severe)     7 6. OTHER SYMPTOMS: Do you have any other symptoms? (e.g., fever)     pain 7. PREGNANCY: Is there any chance you are pregnant? When was your last menstrual period?     no  Protocols used: Shingles (Zoster)-A-AH

## 2024-10-05 NOTE — Progress Notes (Signed)
 "   Assessment & Plan:   Assessment & Plan Herpes zoster without complication NO sign of infection. Short course rx pain medication. Consider gabapentin if continued pain.  Orders:   HYDROcodone -acetaminophen  (NORCO/VICODIN) 5-325 MG tablet; Take 1 tablet by mouth every 8 (eight) hours as needed for severe pain (pain score 7-10).  HZV (herpes zoster virus) post herpetic neuralgia Can try patches after rash resolves, stick with ointment for now as adhesive could be irritating to skin with active rash Orders:   lidocaine  (LIDODERM ) 5 %; Place 1 patch onto the skin daily. Remove & Discard patch within 12 hours or as directed by MD     Corean Geralds, MSPAS, PA-C    Subjective:   Chief Complaint  Patient presents with   Herpes Zoster    Shingles under left breast, underarm and back. Very painful. Dx in urgent care.     HPI: Mackenzie Weber is a 63 y.o. female presenting on 10/05/2024 with report of continued pain from shingles.  Sx started about 6d ago Seen at outside UC 5d ago, rx valacyclovir and topical lidocaine .  Spots are drying up, but still dealing with pain.  Had 3 norco left from prior kidney stone, which helps.     ROS: Negative unless specifically indicated above in HPI.   Relevant past medical history reviewed and updated as indicated.   Allergies and medications reviewed and updated.  Current Medications[1]  Allergies[2]  Social History[3]   Objective:   Vitals:   10/05/24 1349  BP: 136/88  Pulse: 76  Height: 4' 11.5 (1.511 m)  Weight: 228 lb (103.4 kg)  BMI (Calculated): 45.3     Appears well, INAD Sclera anicteric Oral mucosa moist Normal resp effort and excursion.  Skin mostly scabbed lesions but few microvesicles or erythematous base in dermatomal pattern left breast, axilla, and upper back.      [1]  Current Outpatient Medications:    albuterol  (VENTOLIN  HFA) 108 (90 Base) MCG/ACT inhaler, Inhale 2 puffs into the lungs every 4  (four) hours as needed for wheezing or shortness of breath., Disp: 18 g, Rfl: 1   Albuterol -Budesonide  (AIRSUPRA ) 90-80 MCG/ACT AERO, Inhale 2 puffs into the lungs as needed (maximum 12 puffs/day)., Disp: 10.7 g, Rfl: 3   azelastine  (ASTELIN ) 0.1 % nasal spray, 1-2 sprays  each nostril twice daily as needed, Disp: 90 mL, Rfl: 1   blood glucose meter kit and supplies KIT, Check blood sugar once daily. One Touch Verio Reflect Meter, Disp: 1 each, Rfl: 0   Budeson-Glycopyrrol-Formoterol  (BREZTRI  AEROSPHERE) 160-9-4.8 MCG/ACT AERO, Inhale 2 puffs into the lungs daily. Use with spacer. Rinse, gargle and spit out after use., Disp: 10.7 g, Rfl: 5   EPINEPHrine  (AUVI-Q ) 0.3 mg/0.3 mL IJ SOAJ injection, Inject 0.3 mg into the muscle as needed for anaphylaxis., Disp: 2 each, Rfl: 1   estradiol  (ESTRACE ) 0.1 MG/GM vaginal cream, Place 1 Applicatorful vaginally once a week., Disp: , Rfl:    famotidine  (PEPCID ) 20 MG tablet, TAKE 1 TABLET BY MOUTH AT BEDTIME, Disp: 30 tablet, Rfl: 5   fluticasone  (FLONASE ) 50 MCG/ACT nasal spray, 2 sprays in each nostril once a day as needed for a stuffy nose, Disp: 16 g, Rfl: 5   glucose blood (ONETOUCH VERIO) test strip, Use as instructed, Disp: 100 each, Rfl: 0   ibuprofen  (ADVIL ) 600 MG tablet, Take 1 tablet (600 mg total) by mouth every 6 (six) hours as needed., Disp: 30 tablet, Rfl: 0   ipratropium (ATROVENT ) 0.06 %  nasal spray, Place 2 sprays into both nostrils 4 (four) times daily as needed for rhinitis., Disp: 15 mL, Rfl: 5   Lancets (ONETOUCH DELICA PLUS LANCET33G) MISC, USE 1  TO CHECK GLUCOSE ONCE DAILY FOR BLOOD SUGAR, Disp: 100 each, Rfl: 0   losartan  (COZAAR ) 100 MG tablet, Take 1 tablet (100 mg total) by mouth daily., Disp: 90 tablet, Rfl: 3   Multiple Vitamin (MULTIVITAMIN) capsule, Take 1 capsule by mouth daily., Disp: , Rfl:    pantoprazole  (PROTONIX ) 20 MG tablet, Take 2 tablets (40 mg total) by mouth daily., Disp: 90 tablet, Rfl: 1   rosuvastatin  (CRESTOR ) 10  MG tablet, Take 1 tablet (10 mg total) by mouth daily., Disp: 90 tablet, Rfl: 3   tirzepatide  (MOUNJARO ) 5 MG/0.5ML Pen, Inject 5 mg into the skin once a week., Disp: 6 mL, Rfl: 1   triamterene -hydrochlorothiazide (MAXZIDE) 75-50 MG tablet, Take 1/2 (one-half) tablet by mouth once daily, Disp: 45 tablet, Rfl: 0   lubiprostone  (AMITIZA ) 8 MCG capsule, Take 1 capsule (8 mcg total) by mouth 2 (two) times daily with a meal. (Patient not taking: Reported on 10/05/2024), Disp: 180 capsule, Rfl: 1   metFORMIN  (GLUCOPHAGE -XR) 500 MG 24 hr tablet, Take 1 tablet by mouth once daily with breakfast (Patient not taking: Reported on 10/05/2024), Disp: 90 tablet, Rfl: 3 [2]  Allergies Allergen Reactions   Aspirin Anxiety and Other (See Comments)    Rapid Heart beat  [3]  Social History Tobacco Use   Smoking status: Never    Passive exposure: Never   Smokeless tobacco: Never  Vaping Use   Vaping status: Never Used  Substance Use Topics   Alcohol use: No   Drug use: No   "

## 2024-10-05 NOTE — Telephone Encounter (Signed)
 Please see message below

## 2024-10-05 NOTE — Telephone Encounter (Signed)
 Called and scheduled patient an appointment with Corean @ 1:40 pm. Dm/cma

## 2024-10-06 ENCOUNTER — Telehealth: Payer: Self-pay

## 2024-10-06 ENCOUNTER — Other Ambulatory Visit (HOSPITAL_COMMUNITY): Payer: Self-pay

## 2024-10-06 NOTE — Telephone Encounter (Signed)
 Called patient to inform her of provider comments and received notification that the Rx lidocaine  patches were approved by her insurance.

## 2024-10-06 NOTE — Telephone Encounter (Signed)
 Pharmacy Patient Advocate Encounter  Received notification from [facility] that Prior Authorization for Lidocaine  5% patches has been APPROVED from 10/06/24 to 01/04/25. Ran test claim, Copay is $5. This test claim was processed through [facility]- copay amounts may vary at other pharmacies due to pharmacy/plan contracts, or as the patient moves through the different stages of their insurance plan.   PA #/Case ID/Reference #: [id number]

## 2024-10-06 NOTE — Telephone Encounter (Signed)
 Patient called and informed of the approval of her Lidocaine  patches. She thanked me for calling stating that she just got a call form her pharmacy about both the patches and the pain medication. I thanked her for taking my call

## 2024-10-06 NOTE — Telephone Encounter (Signed)
 Pharmacy Patient Advocate Encounter   Received notification from Onbase that prior authorization for Lidocaine  5% patches is required/requested.   Insurance verification completed.   The patient is insured through [facility]   Per test claim: PA required; PA submitted to above mentioned insurance via Latent Key/confirmation #/EOC [id number] Status is pending

## 2024-10-06 NOTE — Telephone Encounter (Signed)
 Patient called and informed of the approval of the Hydrocodone -acetaminophen  (Norco/Vicodin) 5-325 mg

## 2024-10-06 NOTE — Telephone Encounter (Signed)
 Pharmacy Patient Advocate Encounter   Received notification from Onbase that prior authorization for HYDROcodone -Acetaminophen  5-325MG  tablets is required/requested.   Insurance verification completed.   The patient is insured through [facility].   Per test claim: PA required; PA submitted to above mentioned insurance via Latent Key/confirmation #/EOC [id number] Status is pending

## 2024-10-06 NOTE — Telephone Encounter (Signed)
 Pharmacy Patient Advocate Encounter  Received notification from [facility] that Prior Authorization for HYDROcodone -Acetaminophen  5-325MG  tablets has been APPROVED from 10/06/24 to 11/06/24. Ran test claim, Copay is $2.18. This test claim was processed through [facility]- copay amounts may vary at other pharmacies due to pharmacy/plan contracts, or as the patient moves through the different stages of their insurance plan.   PA #/Case ID/Reference #: [id number]

## 2024-10-06 NOTE — Telephone Encounter (Signed)
 Copied from CRM [id number]. Topic: Clinical - Medication Prior Auth >> Oct 06, 2024 10:42 AM Nessti S wrote: Reason for CRM: pt called about PA for lidocaine  5% patches. Status is pending. She would like to know what should she take in the meantime. Call soon as possible  [phone]

## 2024-10-12 ENCOUNTER — Telehealth: Payer: Self-pay | Admitting: Nurse Practitioner

## 2024-10-12 DIAGNOSIS — B029 Zoster without complications: Secondary | ICD-10-CM

## 2024-10-12 NOTE — Telephone Encounter (Signed)
 Access Nurse call in Media

## 2024-10-13 NOTE — Telephone Encounter (Signed)
 Good morning! Kristi asked me to send this to you and have you forward to Dr Prentiss.  Thank you

## 2024-10-14 MED ORDER — LIDOCAINE 5 % EX OINT: 1.0000 | TOPICAL_OINTMENT | CUTANEOUS | 0 refills | Status: DC | PRN

## 2024-10-14 NOTE — Telephone Encounter (Signed)
 Patient called and informed

## 2024-10-14 NOTE — Telephone Encounter (Signed)
 E2C2 called Angela on CAL. Pt was asking to speak with a supervisor. CAL transferred call to me. Pt was not angry, but wanted to share her disappointment with the practice and the delay in responding to her. Pt stated she was in severe pain with shingles for days. She stated that she called her GYN yesterday due to no response from our office. They were able to send in gabapentin and prednisone .   She shared that 12/22 when she came in [place] told her to call or send us  a message if she had continued pain and we would send gabapentin for her. Pt stated calling Sunday 12/28, Monday 12/29 and Tues 12/30 without a response.   Pt states she will be finding a new provider due to this incident. Pt requested an appointment with Roselie to express gratitude to her and discuss the incident. Pt is scheduled for 1/13 1:40pm.   Pt states the rash is not spreading. 1 bad spot in underarm. She has put Vaseline on it since she is out of lidocaine  cream. Her back and chest area is no longer burning but still sore. The Gabapentin and prednisone  GYN sent in are helping with that. She asked for more lidocaine  cream. Sending to Lauren as she has reviewed chart.   [facility] in [place], [place].

## 2024-10-21 ENCOUNTER — Other Ambulatory Visit: Payer: Self-pay | Admitting: Family

## 2024-10-21 ENCOUNTER — Ambulatory Visit: Payer: Self-pay

## 2024-10-21 DIAGNOSIS — B029 Zoster without complications: Secondary | ICD-10-CM

## 2024-10-21 MED ORDER — HYDROCODONE-ACETAMINOPHEN 5-325 MG PO TABS: 1.0000 | ORAL_TABLET | Freq: Two times a day (BID) | ORAL | 0 refills | Status: DC | PRN

## 2024-10-21 NOTE — Telephone Encounter (Unsigned)
 Copied from CRM [id number]. Topic: Clinical - Prescription Issue >> Oct 21, 2024  9:28 AM Rea BROCKS wrote: Reason for CRM: lidocaine  (XYLOCAINE ) 5 % ointment  Patient called in and stated that the ointment was not approved by insurance and is asking if another ointment medicine can be called in for her. And, also pain medication.   [id number] - [place], [place] - [address] [place] [address] Phone: [phone] Fax: [phone] Hours: Not open 24 hours

## 2024-10-21 NOTE — Telephone Encounter (Signed)
 Called patient to ask what medication she is needing refilled. I stated that I do not see the Gabapentin on her current medication list. She stated that is not what she is needing refilled. Patient stated that she is out of the pain medication Hydrocodone -acetaminophen  5-325 and that she is still in pain. Informed patient that I will send this for review and get back with her. She thanked me for calling and verbalized understanding.

## 2024-10-21 NOTE — Addendum Note (Signed)
 Addended by: Fumio Vandam A on: 10/21/2024 02:53 PM   Modules accepted: Orders

## 2024-10-21 NOTE — Telephone Encounter (Signed)
 I called and spoke with patient and she is still having pain and can not put a bra on. I told patient that since she is still having pain she will need to be seen and she said that she is not paying another $80.00 to be seen. Patient is very upset that she will need to come back in to be rechecked and not able to get a Rx of Hydrocodone  for pain. She said that has an appointment to see Roselie next week but needs something now.

## 2024-10-21 NOTE — Telephone Encounter (Signed)
 FYI Only or Action Required?: Action required by provider: medication refill request.  Patient was last seen in primary care on 10/05/2024 by Waddell Krabbe, PA-C.  Called Nurse Triage reporting Rash.  Symptoms began several weeks ago.  Interventions attempted: OTC medications: Lidocaine  ointment and Prescription medications: Gabapentin.  Symptoms are: unchanged.  Triage Disposition: Home Care  Patient/caregiver understands and will follow disposition?: Yes  Reason for Disposition  [1] Shingles rash already diagnosed and [2] taking antiviral medication  Answer Assessment - Initial Assessment Questions Patient was seen in office for shingles on 10/05/24. She was prescribed pain medication and is calling in to request a refill on this. She is also requesting a prescription for lidocaine  ointment, she states that the one that was sent in was not accepted by insurance. She denies any changes or worsening symptoms at this time, but does remain in severe pain and is only taking Gabapentin and using OTC ointment at this time.   1. APPEARANCE of RASH: What does the rash look like?      Redness  2. LOCATION: Where is the rash located?      Left chest, underarm, and back  3. ONSET: When did the rash start?      About 3 weeks ago  4. ITCHING: Does the rash itch? If Yes, ask: How bad is the itch?  (Scale 1-10; or mild, moderate, severe)     No  5. PAIN: Does the rash hurt? If Yes, ask: How bad is the pain?  (Scale 0-10; or none, mild, moderate, severe)     Yes, severe  6. OTHER SYMPTOMS: Do you have any other symptoms? (e.g., fever)     Denies any other symptoms  7. PREGNANCY: Is there any chance you are pregnant? When was your last menstrual period?     NA  Protocols used: Shingles (Zoster)-A-AH  Copied from CRM [id number]. Topic: Clinical - Red Word Triage >> Oct 21, 2024  9:25 AM Rea BROCKS wrote: Red Word that prompted transfer to Nurse Triage: Shingles with  severe pain- whole left chest, underarm, and back severe pain.   Initial call was for call in for pain medicine and another ointment due to insurance not accepting first one sent in.

## 2024-10-21 NOTE — Telephone Encounter (Signed)
 I called and spoke with patient and notified her of below message and she will follow up with [name], NP on Tuesday.

## 2024-10-21 NOTE — Telephone Encounter (Signed)
 There is also a Triage note on 10/21/24 regarding this issue.

## 2024-10-22 NOTE — Telephone Encounter (Signed)
 Medication was sent in by Tinnie Harada, NP to patient's pharmacy. Patient called and informed of medication sent in and she stated that someone called her and told her that she would need to follow up before it would be sent in. Patient stated that she told the person who called that lady it hurts to put on a bra do you think I will put clothes on to come into the office. She thanked me for calling to let her know that medication was sent in.

## 2024-10-27 ENCOUNTER — Ambulatory Visit: Admitting: Allergy & Immunology

## 2024-10-27 ENCOUNTER — Ambulatory Visit: Admitting: Nurse Practitioner

## 2024-10-27 ENCOUNTER — Encounter: Payer: Self-pay | Admitting: Nurse Practitioner

## 2024-10-27 VITALS — BP 126/76 | HR 78 | Ht 59.5 in | Wt 223.2 lb

## 2024-10-27 DIAGNOSIS — B0229 Other postherpetic nervous system involvement: Secondary | ICD-10-CM | POA: Diagnosis not present

## 2024-10-27 MED ORDER — LIDOCAINE 5 % EX PTCH: 1.0000 | MEDICATED_PATCH | CUTANEOUS | 0 refills | Status: AC

## 2024-10-27 MED ORDER — HYDROCODONE-ACETAMINOPHEN 5-325 MG PO TABS: 1.0000 | ORAL_TABLET | Freq: Every evening | ORAL | 0 refills | Status: AC | PRN

## 2024-10-27 MED ORDER — GABAPENTIN 300 MG PO CAPS: 300.0000 mg | ORAL_CAPSULE | Freq: Three times a day (TID) | ORAL | 0 refills | Status: DC

## 2024-10-27 MED ORDER — IBUPROFEN 600 MG PO TABS: 600.0000 mg | ORAL_TABLET | Freq: Two times a day (BID) | ORAL | 0 refills | Status: AC | PRN

## 2024-10-27 NOTE — Assessment & Plan Note (Signed)
 Left T1-T3 dermatome. Rash has healed. No erythema, no lymphadenopathy. Persistent neuropathic pain-burning sensation, sharp, constant, worse with left arm movement or laying on left side. Current use of Ibuprofen  400mg  every 3x/day, Hydrocodone  1/2tab 2x/day, and Gabapentin  300mg  2x/day. With mild relief.  Unable to tolerate warm and cold compress  Advised to increase gabapentin  300mg  dose to TID (will send additional tabs if no daytime somnolence), use ibuprofen  600mg  2x/day for moderate pain, hydrocodone  1tab at hsprn for severe pain, and lidocaine  5% patch as directed on package. Advised to stop meloxicam . Plan to administer shingrix vaccine in 90days. Consider switch gabapentin  300mg  to lyrica if daytime somnolence.

## 2024-10-27 NOTE — Progress Notes (Signed)
 "               Established Patient Visit  Patient: Mackenzie Weber   DOB: 14-Sep-1961   64 y.o. Female  MRN: [id number] Visit Date: 10/27/2024  Subjective:    Chief Complaint  Patient presents with   Follow-up    Follow up for shingles still having pain on left side (breast, back, chest, under arm and swelling of underarm)   HPI Post herpetic neuralgia Left T1-T3 dermatome. Rash has healed. No erythema, no lymphadenopathy. Persistent neuropathic pain-burning sensation, sharp, constant, worse with left arm movement or laying on left side. Current use of Ibuprofen  400mg  every 3x/day, Hydrocodone  1/2tab 2x/day, and Gabapentin  300mg  2x/day. With mild relief.  Unable to tolerate warm and cold compress  Advised to increase gabapentin  300mg  dose to TID (will send additional tabs if no daytime somnolence), use ibuprofen  600mg  2x/day for moderate pain, hydrocodone  1tab at [facility]spr[facility] for severe pain, and lidocaine  5% patch as directed on package. Advised to stop meloxicam . Plan to administer shingrix vaccine in 90days. Consider switch gabapentin  300mg  to lyrica if daytime somnolence.  Reviewed medical, surgical, and social history today  Medications: Show/hide medication list[1] Reviewed past medical and social history.   ROS per HPI above      Objective:  BP 126/76 (BP Location: Right Arm, Patient Position: Sitting, Cuff Size: Large)   Pulse 78   Ht 4' 11.5 (1.511 m)   Wt 223 lb 3.2 oz (101.2 kg)   SpO2 97%   BMI 44.33 kg/m      Physical Exam Vitals and nursing note reviewed.  Chest:     Chest wall: Tenderness present. No swelling.  Breasts:    Breasts are symmetrical.  Musculoskeletal:     Cervical back: Normal range of motion and neck supple.  Lymphadenopathy:     Cervical: No cervical adenopathy.     Upper Body:     Left upper body: No supraclavicular, axillary or pectoral adenopathy.  Skin:    Findings: No bruising, erythema or rash.  Neurological:     Mental Status:  She is alert.     No results found for any visits on 10/27/24.    Assessment & Plan:    Problem List Items Addressed This Visit     Post herpetic neuralgia - Primary   Left T1-T3 dermatome. Rash has healed. No erythema, no lymphadenopathy. Persistent neuropathic pain-burning sensation, sharp, constant, worse with left arm movement or laying on left side. Current use of Ibuprofen  400mg  every 3x/day, Hydrocodone  1/2tab 2x/day, and Gabapentin  300mg  2x/day. With mild relief.  Unable to tolerate warm and cold compress  Advised to increase gabapentin  300mg  dose to TID (will send additional tabs if no daytime somnolence), use ibuprofen  600mg  2x/day for moderate pain, hydrocodone  1tab at hspr[facility] for severe pain, and lidocaine  5% patch as directed on package. Advised to stop meloxicam . Plan to administer shingrix vaccine in 90days. Consider switch gabapentin  300mg  to lyrica if daytime somnolence.      Relevant Medications   HYDROcodone -acetaminophen  (NORCO/VICODIN) 5-325 MG tablet   ibuprofen  (ADVIL ) 600 MG tablet   gabapentin  (NEURONTIN ) 300 MG capsule   lidocaine  (LIDODERM ) 5 %   Return in about 3 months (around 01/25/2025) for HTN, DM, hyperlipidemia (fasting).     Roselie Mood, NP      [1]  Outpatient Medications Prior to Visit  Medication Sig   albuterol  (VENTOLIN  HFA) 108 (90 Base) MCG/ACT inhaler Inhale 2 puffs into the lungs every 4 (four) hours  as needed for wheezing or shortness of breath.   Albuterol -Budesonide  (AIRSUPRA ) 90-80 MCG/ACT AERO Inhale 2 puffs into the lungs as needed (maximum 12 puffs/day).   azelastine  (ASTELIN ) 0.1 % nasal spray 1-2 sprays  each nostril twice daily as needed   blood glucose meter kit and supplies KIT Check blood sugar once daily. One Touch Verio Reflect Meter   Budeson-Glycopyrrol-Formoterol  (BREZTRI  AEROSPHERE) 160-9-4.8 MCG/ACT AERO Inhale 2 puffs into the lungs daily. Use with spacer. Rinse, gargle and spit out after use.   EPINEPHrine   (AUVI-Q ) 0.3 mg/0.3 mL IJ SOAJ injection Inject 0.3 mg into the muscle as needed for anaphylaxis.   estradiol  (ESTRACE ) 0.1 MG/GM vaginal cream Place 1 Applicatorful vaginally once a week.   famotidine  (PEPCID ) 20 MG tablet TAKE 1 TABLET BY MOUTH AT BEDTIME   fluticasone  (FLONASE ) 50 MCG/ACT nasal spray 2 sprays in each nostril once a day as needed for a stuffy nose   glucose blood (ONETOUCH VERIO) test strip Use as instructed   ipratropium (ATROVENT ) 0.06 % nasal spray Place 2 sprays into both nostrils 4 (four) times daily as needed for rhinitis.   Lancets (ONETOUCH DELICA PLUS LANCET33G) MISC USE 1  TO CHECK GLUCOSE ONCE DAILY FOR BLOOD SUGAR   losartan  (COZAAR ) 100 MG tablet Take 1 tablet (100 mg total) by mouth daily.   metFORMIN  (GLUCOPHAGE -XR) 500 MG 24 hr tablet Take 1 tablet by mouth once daily with breakfast   Multiple Vitamin (MULTIVITAMIN) capsule Take 1 capsule by mouth daily.   pantoprazole  (PROTONIX ) 20 MG tablet Take 2 tablets (40 mg total) by mouth daily.   rosuvastatin  (CRESTOR ) 10 MG tablet Take 1 tablet (10 mg total) by mouth daily.   tirzepatide  (MOUNJARO ) 5 MG/0.5ML Pen Inject 5 mg into the skin once a week.   triamterene -hydrochlorothiazide (MAXZIDE) 75-50 MG tablet Take 1/2 (one-half) tablet by mouth once daily   [DISCONTINUED] HYDROcodone -acetaminophen  (NORCO/VICODIN) 5-325 MG tablet Take 1 tablet by mouth 2 (two) times daily as needed for severe pain (pain score 7-10).   [DISCONTINUED] ibuprofen  (ADVIL ) 600 MG tablet Take 1 tablet (600 mg total) by mouth every 6 (six) hours as needed.   [DISCONTINUED] meloxicam  (MOBIC ) 15 MG tablet Take 15 mg by mouth daily.   lubiprostone  (AMITIZA ) 8 MCG capsule Take 1 capsule (8 mcg total) by mouth 2 (two) times daily with a meal. (Patient not taking: Reported on 10/05/2024)   [DISCONTINUED] gabapentin  (NEURONTIN ) 300 MG capsule Take 300 mg by mouth 2 (two) times daily.   [DISCONTINUED] lidocaine  (LIDODERM ) 5 % Place 1 patch onto the  skin daily.   [DISCONTINUED] lidocaine  (XYLOCAINE ) 5 % ointment Apply 1 Application topically as needed. (Patient not taking: Reported on 10/27/2024)   No facility-administered medications prior to visit.   "

## 2024-11-02 ENCOUNTER — Encounter: Payer: Self-pay | Admitting: Nurse Practitioner

## 2024-11-05 ENCOUNTER — Telehealth: Payer: Self-pay

## 2024-11-05 DIAGNOSIS — B0229 Other postherpetic nervous system involvement: Secondary | ICD-10-CM

## 2024-11-05 NOTE — Telephone Encounter (Signed)
 Called patient to ask what medication she is needing a refill on. She stated that she needs a refill on the Gabapentin  that she was not given enough. I informed her that [facility] sent in a 7 day supply on the Gabapentin  300 mg. Patient stated that was not enough that she is still having pain. I informed her that I will send to [facility] for review. She verbalized understanding

## 2024-11-05 NOTE — Telephone Encounter (Signed)
 Copied from CRM #[id number]. Topic: Clinical - Medication Refill >> Nov 05, 2024  8:19 AM Macario HERO wrote: Medication: gabapentin  (NEURONTIN ) 300 MG capsule [[id number]]  Has the patient contacted their pharmacy? No (Agent: If no, request that the patient contact the pharmacy for the refill. If patient does not wish to contact the pharmacy document the reason why and proceed with request.) (Agent: If yes, when and what did the pharmacy advise?)  This is the patient's preferred pharmacy:  [id number] - [place], [place] - [address] [place] [address] Phone: [phone] Fax: [phone]   Is this the correct pharmacy for this prescription? Yes If no, delete pharmacy and type the correct one.   Has the prescription been filled recently? Yes  Is the patient out of the medication? Yes  Has the patient been seen for an appointment in the last year OR does the patient have an upcoming appointment? Yes  Can we respond through MyChart? No  Agent: Please be advised that Rx refills may take up to 3 business days. We ask that you follow-up with your pharmacy.

## 2024-11-05 NOTE — Telephone Encounter (Signed)
 Copied from CRM [id number]. Topic: Clinical - Prescription Issue >> Nov 05, 2024  8:35 AM Mackenzie Weber wrote: Reason for CRM:  Patient became frustrated when I advised she would need to follow up with the pharmacy for prescription refill. She is requesting the nurse to call her and advised if medication refill is approved.

## 2024-11-06 MED ORDER — GABAPENTIN 300 MG PO CAPS: 300.0000 mg | ORAL_CAPSULE | Freq: Three times a day (TID) | ORAL | 0 refills | Status: DC

## 2024-11-06 NOTE — Addendum Note (Signed)
 Addended by: LENON ROUGHEN on: 11/06/2024 05:01 PM   Modules accepted: Orders

## 2024-11-06 NOTE — Telephone Encounter (Signed)
 Spoke to [employer], NP about the patient's request to have another 7 day supply for the Gabapentin  300 mg TID. Lauren is okay to send in a 7 day supply and to inform patient if she is not better she will need to schedule an appointment for evaluation.  Called patient and made her aware refill was sent for a 7 day supply to [facility] and to cal the office is she is not feeling better for an appointment. Patient thanked me for call and stated that she will call if she is not better after this

## 2024-11-06 NOTE — Telephone Encounter (Signed)
 Copied from CRM #[id number]. Topic: Clinical - Prescription Issue >> Nov 06, 2024  2:51 PM Brittany M wrote: Reason for CRM: Patient calling to check on request for Gabapentin . Please reach out when available

## 2024-11-11 ENCOUNTER — Ambulatory Visit: Admitting: Nurse Practitioner

## 2024-11-11 ENCOUNTER — Encounter: Payer: Self-pay | Admitting: Nurse Practitioner

## 2024-11-11 VITALS — BP 134/78 | HR 76 | Temp 97.8°F | Wt 231.4 lb

## 2024-11-11 DIAGNOSIS — R600 Localized edema: Secondary | ICD-10-CM | POA: Diagnosis not present

## 2024-11-11 DIAGNOSIS — B0229 Other postherpetic nervous system involvement: Secondary | ICD-10-CM

## 2024-11-11 MED ORDER — PREGABALIN 75 MG PO CAPS: 75.0000 mg | ORAL_CAPSULE | Freq: Two times a day (BID) | ORAL | 5 refills | Status: AC

## 2024-11-11 MED ORDER — FUROSEMIDE 20 MG PO TABS: 20.0000 mg | ORAL_TABLET | Freq: Every day | ORAL | 0 refills | Status: AC

## 2024-11-11 NOTE — Progress Notes (Signed)
 "               Established Patient Visit  Patient: Mackenzie Weber   DOB: [birth year]   64 y.o. Female  MRN: [id number] Visit Date: 11/11/2024  Subjective:    Chief Complaint  Patient presents with   Follow-up    Follow up for shingles-still having pain (under arm), feels like bumps coming back, swelling of lower extremities,     HPI Post herpetic neuralgia Persistent left chest wall and left upper back pain despite use of ibuprofen  400mg  every 8hrs, gabapentin  300mg  TID, and lidocaine  patch. Reports increased sedation with gabapentin .  Switch gabapentin  to lyrica  75mg  BID Maintain other med doses F/up in 229month  Wt Readings from Last 3 Encounters:  11/11/24 231 lb 6.4 oz (105 kg)  10/27/24 223 lb 3.2 oz (101.2 kg)  10/05/24 228 lb (103.4 kg)    Reviewed medical, surgical, and social history today  Medications: Show/hide medication list[1] Reviewed past medical and social history.   ROS per HPI above      Objective:  BP 134/78 (BP Location: Left Arm, Patient Position: Sitting, Cuff Size: Large)   Pulse 76   Temp 97.8 F (36.6 C) (Oral)   Wt 231 lb 6.4 oz (105 kg)   SpO2 98%   BMI 45.95 kg/m      Physical Exam Vitals and nursing note reviewed.  Cardiovascular:     Rate and Rhythm: Normal rate.     Pulses: Normal pulses.  Pulmonary:     Effort: Pulmonary effort is normal.  Chest:  Breasts:    Breasts are symmetrical.  Musculoskeletal:     Left shoulder: Normal.     Left upper arm: Normal.     Cervical back: Normal, normal range of motion and neck supple.     Thoracic back: Normal.  Lymphadenopathy:     Cervical: No cervical adenopathy.     Upper Body:     Right upper body: No supraclavicular, axillary or pectoral adenopathy.     Left upper body: No supraclavicular, axillary or pectoral adenopathy.  Skin:    Findings: No erythema or rash.  Neurological:     Mental Status: She is alert and oriented to person, place, and time.     No results found  for any visits on 11/11/24.    Assessment & Plan:    Problem List Items Addressed This Visit     Bilateral lower extremity edema   Relevant Medications   furosemide  (LASIX ) 20 MG tablet   Post herpetic neuralgia - Primary   Persistent left chest wall and left upper back pain despite use of ibuprofen  400mg  every 8hrs, gabapentin  300mg  TID, and lidocaine  patch. Reports increased sedation with gabapentin .  Switch gabapentin  to lyrica  75mg  BID Maintain other med doses F/up in 229month      Relevant Medications   pregabalin  (LYRICA ) 75 MG capsule   Return for maintain upcoming appt in March.     Roselie Mood, NP       [1]  Outpatient Medications Prior to Visit  Medication Sig   albuterol  (VENTOLIN  HFA) 108 (90 Base) MCG/ACT inhaler Inhale 2 puffs into the lungs every 4 (four) hours as needed for wheezing or shortness of breath.   Albuterol -Budesonide  (AIRSUPRA ) 90-80 MCG/ACT AERO Inhale 2 puffs into the lungs as needed (maximum 12 puffs/day).   azelastine  (ASTELIN ) 0.1 % nasal spray 1-2 sprays  each nostril twice daily as needed   blood glucose meter kit and supplies KIT  Check blood sugar once daily. One Touch Verio Reflect Meter   Budeson-Glycopyrrol-Formoterol  (BREZTRI  AEROSPHERE) 160-9-4.8 MCG/ACT AERO Inhale 2 puffs into the lungs daily. Use with spacer. Rinse, gargle and spit out after use.   EPINEPHrine  (AUVI-Q ) 0.3 mg/0.3 mL IJ SOAJ injection Inject 0.3 mg into the muscle as needed for anaphylaxis.   estradiol  (ESTRACE ) 0.1 MG/GM vaginal cream Place 1 Applicatorful vaginally once a week.   famotidine  (PEPCID ) 20 MG tablet TAKE 1 TABLET BY MOUTH AT BEDTIME   fluticasone  (FLONASE ) 50 MCG/ACT nasal spray 2 sprays in each nostril once a day as needed for a stuffy nose   glucose blood (ONETOUCH VERIO) test strip Use as instructed   HYDROcodone -acetaminophen  (NORCO/VICODIN) 5-325 MG tablet Take 1 tablet by mouth as needed for moderate pain (pain score 4-6).   ibuprofen   (ADVIL ) 600 MG tablet Take 1 tablet (600 mg total) by mouth every 12 (twelve) hours as needed.   ipratropium (ATROVENT ) 0.06 % nasal spray Place 2 sprays into both nostrils 4 (four) times daily as needed for rhinitis.   Lancets (ONETOUCH DELICA PLUS LANCET33G) MISC USE 1  TO CHECK GLUCOSE ONCE DAILY FOR BLOOD SUGAR   lidocaine  (LIDODERM ) 5 % Place 1 patch onto the skin daily.   losartan  (COZAAR ) 100 MG tablet Take 1 tablet (100 mg total) by mouth daily.   lubiprostone  (AMITIZA ) 8 MCG capsule Take 1 capsule (8 mcg total) by mouth 2 (two) times daily with a meal. (Patient not taking: Reported on 10/05/2024)   metFORMIN  (GLUCOPHAGE -XR) 500 MG 24 hr tablet Take 1 tablet by mouth once daily with breakfast   Multiple Vitamin (MULTIVITAMIN) capsule Take 1 capsule by mouth daily.   pantoprazole  (PROTONIX ) 20 MG tablet Take 2 tablets (40 mg total) by mouth daily.   rosuvastatin  (CRESTOR ) 10 MG tablet Take 1 tablet (10 mg total) by mouth daily.   tirzepatide  (MOUNJARO ) 5 MG/0.5ML Pen Inject 5 mg into the skin once a week.   triamterene -hydrochlorothiazide (MAXZIDE) 75-50 MG tablet Take 1/2 (one-half) tablet by mouth once daily   [DISCONTINUED] gabapentin  (NEURONTIN ) 300 MG capsule Take 1 capsule (300 mg total) by mouth 3 (three) times daily.   No facility-administered medications prior to visit.   "

## 2024-11-11 NOTE — Patient Instructions (Signed)
 Stop gabapentin  Start lyrica  Continue ibuprofen  and lidocaine  patch. Use compression stocking

## 2024-11-11 NOTE — Assessment & Plan Note (Signed)
 Persistent left chest wall and left upper back pain despite use of ibuprofen  400mg  every 8hrs, gabapentin  300mg  TID, and lidocaine  patch. Reports increased sedation with gabapentin .  Switch gabapentin  to lyrica  75mg  BID Maintain other med doses F/up in 75month

## 2024-11-17 ENCOUNTER — Ambulatory Visit: Admitting: Allergy & Immunology

## 2024-12-15 ENCOUNTER — Ambulatory Visit: Admitting: Family

## 2025-01-26 ENCOUNTER — Ambulatory Visit: Admitting: Nurse Practitioner
